# Patient Record
Sex: Female | Born: 1945 | Race: White | Hispanic: No | Marital: Married | Smoking: Former smoker
Health system: Southern US, Community
[De-identification: ages and names within clinical notes are randomized; demographics above are authoritative.]

## PROBLEM LIST (undated history)

## (undated) DIAGNOSIS — N1831 Chronic kidney disease, stage 3a: Secondary | ICD-10-CM

## (undated) DIAGNOSIS — G629 Polyneuropathy, unspecified: Secondary | ICD-10-CM

## (undated) DIAGNOSIS — E7800 Pure hypercholesterolemia, unspecified: Secondary | ICD-10-CM

## (undated) DIAGNOSIS — J302 Other seasonal allergic rhinitis: Secondary | ICD-10-CM

## (undated) DIAGNOSIS — J349 Unspecified disorder of nose and nasal sinuses: Secondary | ICD-10-CM

## (undated) DIAGNOSIS — R011 Cardiac murmur, unspecified: Secondary | ICD-10-CM

## (undated) DIAGNOSIS — E039 Hypothyroidism, unspecified: Secondary | ICD-10-CM

## (undated) DIAGNOSIS — M1990 Unspecified osteoarthritis, unspecified site: Secondary | ICD-10-CM

## (undated) DIAGNOSIS — M719 Bursopathy, unspecified: Secondary | ICD-10-CM

## (undated) DIAGNOSIS — K8010 Calculus of gallbladder with chronic cholecystitis without obstruction: Secondary | ICD-10-CM

## (undated) DIAGNOSIS — K219 Gastro-esophageal reflux disease without esophagitis: Secondary | ICD-10-CM

## (undated) DIAGNOSIS — I10 Essential (primary) hypertension: Secondary | ICD-10-CM

## (undated) DIAGNOSIS — E785 Hyperlipidemia, unspecified: Secondary | ICD-10-CM

## (undated) DIAGNOSIS — I2510 Atherosclerotic heart disease of native coronary artery without angina pectoris: Secondary | ICD-10-CM

## (undated) DIAGNOSIS — R7303 Prediabetes: Secondary | ICD-10-CM

## (undated) DIAGNOSIS — D649 Anemia, unspecified: Secondary | ICD-10-CM

## (undated) DIAGNOSIS — E789 Disorder of lipoprotein metabolism, unspecified: Secondary | ICD-10-CM

## (undated) DIAGNOSIS — M79606 Pain in leg, unspecified: Secondary | ICD-10-CM

## (undated) DIAGNOSIS — I2121 ST elevation (STEMI) myocardial infarction involving left circumflex coronary artery: Secondary | ICD-10-CM

## (undated) DIAGNOSIS — R911 Solitary pulmonary nodule: Secondary | ICD-10-CM

## (undated) HISTORY — PX: COLONOSCOPY: SHX174

## (undated) HISTORY — DX: Atherosclerotic heart disease of native coronary artery without angina pectoris: I25.10

## (undated) HISTORY — DX: Solitary pulmonary nodule: R91.1

## (undated) HISTORY — DX: Pure hypercholesterolemia, unspecified: E78.00

## (undated) HISTORY — DX: Unspecified osteoarthritis, unspecified site: M19.90

## (undated) HISTORY — DX: Hypothyroidism, unspecified: E03.9

## (undated) HISTORY — DX: Hyperlipidemia, unspecified: E78.5

## (undated) HISTORY — DX: Polyneuropathy, unspecified: G62.9

## (undated) HISTORY — DX: Chronic kidney disease, stage 3a: N18.31

## (undated) HISTORY — DX: Bursopathy, unspecified: M71.9

## (undated) HISTORY — DX: Pain in leg, unspecified: M79.606

## (undated) HISTORY — PX: APPENDECTOMY: SHX54

---

## 1998-01-30 DIAGNOSIS — M1990 Unspecified osteoarthritis, unspecified site: Secondary | ICD-10-CM | POA: Insufficient documentation

## 1998-06-08 ENCOUNTER — Ambulatory Visit (HOSPITAL_COMMUNITY): Admission: RE | Admit: 1998-06-08 | Discharge: 1998-06-08 | Payer: Self-pay | Admitting: *Deleted

## 1999-03-23 ENCOUNTER — Encounter: Payer: Self-pay | Admitting: Gynecology

## 1999-03-28 ENCOUNTER — Inpatient Hospital Stay (HOSPITAL_COMMUNITY): Admission: RE | Admit: 1999-03-28 | Discharge: 1999-03-30 | Payer: Self-pay | Admitting: Gynecology

## 1999-07-19 ENCOUNTER — Ambulatory Visit (HOSPITAL_COMMUNITY): Admission: RE | Admit: 1999-07-19 | Discharge: 1999-07-19 | Payer: Self-pay | Admitting: Gastroenterology

## 1999-08-09 ENCOUNTER — Encounter: Admission: RE | Admit: 1999-08-09 | Discharge: 1999-08-09 | Payer: Self-pay | Admitting: Gastroenterology

## 1999-08-09 ENCOUNTER — Encounter: Payer: Self-pay | Admitting: Gastroenterology

## 2000-08-31 ENCOUNTER — Encounter: Payer: Self-pay | Admitting: Family Medicine

## 2000-08-31 ENCOUNTER — Ambulatory Visit (HOSPITAL_COMMUNITY): Admission: RE | Admit: 2000-08-31 | Discharge: 2000-08-31 | Payer: Self-pay | Admitting: Family Medicine

## 2001-02-04 ENCOUNTER — Encounter: Payer: Self-pay | Admitting: Specialist

## 2001-02-11 ENCOUNTER — Ambulatory Visit (HOSPITAL_COMMUNITY): Admission: RE | Admit: 2001-02-11 | Discharge: 2001-02-11 | Payer: Self-pay | Admitting: Specialist

## 2001-11-15 ENCOUNTER — Encounter: Payer: Self-pay | Admitting: Specialist

## 2001-11-22 ENCOUNTER — Encounter: Payer: Self-pay | Admitting: Specialist

## 2001-11-22 ENCOUNTER — Inpatient Hospital Stay (HOSPITAL_COMMUNITY): Admission: RE | Admit: 2001-11-22 | Discharge: 2001-11-26 | Payer: Self-pay | Admitting: Specialist

## 2002-05-21 ENCOUNTER — Other Ambulatory Visit: Admission: RE | Admit: 2002-05-21 | Discharge: 2002-05-21 | Payer: Self-pay | Admitting: Gynecology

## 2003-04-03 ENCOUNTER — Ambulatory Visit (HOSPITAL_COMMUNITY): Admission: RE | Admit: 2003-04-03 | Discharge: 2003-04-03 | Payer: Self-pay | Admitting: Family Medicine

## 2003-05-01 ENCOUNTER — Ambulatory Visit (HOSPITAL_COMMUNITY): Admission: RE | Admit: 2003-05-01 | Discharge: 2003-05-01 | Payer: Self-pay | Admitting: Specialist

## 2003-10-27 ENCOUNTER — Other Ambulatory Visit: Admission: RE | Admit: 2003-10-27 | Discharge: 2003-10-27 | Payer: Self-pay | Admitting: Gynecology

## 2004-10-27 ENCOUNTER — Other Ambulatory Visit: Admission: RE | Admit: 2004-10-27 | Discharge: 2004-10-27 | Payer: Self-pay | Admitting: Gynecology

## 2005-10-26 ENCOUNTER — Other Ambulatory Visit: Admission: RE | Admit: 2005-10-26 | Discharge: 2005-10-26 | Payer: Self-pay | Admitting: Gynecology

## 2005-11-09 ENCOUNTER — Encounter: Admission: RE | Admit: 2005-11-09 | Discharge: 2006-02-07 | Payer: Self-pay | Admitting: Family Medicine

## 2010-04-13 ENCOUNTER — Ambulatory Visit (HOSPITAL_COMMUNITY)
Admission: RE | Admit: 2010-04-13 | Discharge: 2010-04-13 | Disposition: A | Discharge status: Home / Self Care | Payer: 59 | Source: Ambulatory Visit | Attending: Orthopedic Surgery | Admitting: Orthopedic Surgery

## 2010-04-13 ENCOUNTER — Encounter (HOSPITAL_COMMUNITY)
Admission: RE | Admit: 2010-04-13 | Discharge: 2010-04-13 | Disposition: A | Discharge status: Home / Self Care | Payer: 59 | Source: Ambulatory Visit | Attending: Orthopedic Surgery | Admitting: Orthopedic Surgery

## 2010-04-13 ENCOUNTER — Other Ambulatory Visit (HOSPITAL_COMMUNITY): Payer: Self-pay | Admitting: Orthopedic Surgery

## 2010-04-13 DIAGNOSIS — Z01812 Encounter for preprocedural laboratory examination: Secondary | ICD-10-CM | POA: Insufficient documentation

## 2010-04-13 DIAGNOSIS — M47816 Spondylosis without myelopathy or radiculopathy, lumbar region: Secondary | ICD-10-CM

## 2010-04-13 DIAGNOSIS — M47817 Spondylosis without myelopathy or radiculopathy, lumbosacral region: Secondary | ICD-10-CM | POA: Insufficient documentation

## 2010-04-13 DIAGNOSIS — Z01818 Encounter for other preprocedural examination: Secondary | ICD-10-CM | POA: Insufficient documentation

## 2010-04-13 LAB — BASIC METABOLIC PANEL
BUN: 15 mg/dL (ref 6–23)
CO2: 27 mEq/L (ref 19–32)
Calcium: 10 mg/dL (ref 8.4–10.5)
Chloride: 98 mEq/L (ref 96–112)
Creatinine, Ser: 0.82 mg/dL (ref 0.4–1.2)
GFR calc Af Amer: >60 mL/min (ref >60)
GFR calc non Af Amer: >60 mL/min (ref >60)
Glucose, Bld: 105 mg/dL — ABNORMAL HIGH (ref 70–99)
Potassium: 3.8 mEq/L (ref 3.5–5.1)
Sodium: 133 mEq/L — ABNORMAL LOW (ref 135–145)

## 2010-04-13 LAB — CBC
HCT: 39.6 % (ref 36.0–46.0)
Hemoglobin: 13.7 g/dL (ref 12.0–15.0)
MCH: 29.5 pg (ref 26.0–34.0)
MCHC: 34.6 g/dL (ref 30.0–36.0)
MCV: 85.2 fL (ref 78.0–100.0)
Platelets: 187 10*3/uL (ref 150–400)
RBC: 4.65 MIL/uL (ref 3.87–5.11)
RDW: 14 % (ref 11.5–15.5)
WBC: 8.7 10*3/uL (ref 4.0–10.5)

## 2010-04-13 LAB — SURGICAL PCR SCREEN
MRSA, PCR: NEGATIVE
Staphylococcus aureus: NEGATIVE

## 2010-04-20 ENCOUNTER — Ambulatory Visit (HOSPITAL_COMMUNITY)
Admission: RE | Admit: 2010-04-20 | Discharge: 2010-04-20 | Disposition: A | Discharge status: Home / Self Care | Payer: 59 | Source: Ambulatory Visit | Attending: Orthopedic Surgery | Admitting: Orthopedic Surgery

## 2010-04-20 ENCOUNTER — Inpatient Hospital Stay (HOSPITAL_COMMUNITY)
Admission: RE | Admit: 2010-04-20 | Discharge: 2010-04-22 | DRG: 460 | Disposition: A | Discharge status: Home / Self Care | Payer: 59 | Source: Ambulatory Visit | Attending: Orthopedic Surgery | Admitting: Orthopedic Surgery

## 2010-04-20 ENCOUNTER — Inpatient Hospital Stay (HOSPITAL_COMMUNITY): Payer: 59

## 2010-04-20 ENCOUNTER — Other Ambulatory Visit (HOSPITAL_COMMUNITY): Payer: Self-pay | Admitting: Orthopedic Surgery

## 2010-04-20 DIAGNOSIS — M5126 Other intervertebral disc displacement, lumbar region: Secondary | ICD-10-CM

## 2010-04-20 DIAGNOSIS — I10 Essential (primary) hypertension: Secondary | ICD-10-CM | POA: Diagnosis present

## 2010-04-20 DIAGNOSIS — K219 Gastro-esophageal reflux disease without esophagitis: Secondary | ICD-10-CM | POA: Diagnosis present

## 2010-04-20 LAB — TYPE AND SCREEN
ABO/RH(D): B POS
Antibody Screen: NEGATIVE

## 2010-04-20 LAB — ABO/RH: ABO/RH(D): B POS

## 2010-04-21 ENCOUNTER — Inpatient Hospital Stay (HOSPITAL_COMMUNITY): Payer: 59

## 2010-04-25 NOTE — Op Note (Signed)
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  PROCEDURES: 1. Gill decompression, L4-L5 left side with complete facetectomy and     excision of pars. 2. Complete diskectomy, L4-L5 with insertion of intervertebral     biomechanical device (titanium size 9 large banana-shaped curved     cage). 3. Posterior instrumented segmental fusion, L4-L5 with Synthes Matrix     pedicle screw system. 4. Posterolateral arthrodesis, L4-L5 with autograft and allograft DBX     mixture.  SURGEON:  Dabria Wadas D. Shon Baton, MD  COMPLICATIONS:  None.  CONDITION:  Stable.  HISTORY:  This is a very pleasant 65 year old woman who has been having progressive disabling back, buttock, and left leg pain.  Attempts at conservative management consisting of physiotherapy, injection therapy, activity modification failed and so she elected to proceed with surgery. All appropriate risks, benefits, and alternatives of surgery were discussed and consent was obtained.  OPERATIVE NOTE:  The patient was brought to the operating room and placed supine on the operating table.  After successful induction of general anesthesia and trach intubation, TED, SCDs, and a Foley were inserted.  The neuro stim representative then inserted all appropriate devices for evoked motor and sensory monitoring throughout the case as well as for EMG monitoring.  Once this was done, the patient was turned prone onto the Wilson frame.  All bony prominences were  well-padded and the back was prepped and draped in a standard fashion.  Appropriate time- out was done to confirm patient, procedure, and affected extremity and all other pertinent important data.  Once this was done, fluoroscopic view x-ray was brought to the wound and I confirmed the L4 pedicle.  I marked this bilaterally and I marked the L4-L5 lateral margins of the pedicle.  I then made a Wiltse incision on the lateral aspect about 2 fingerbreadths lateral to midline dissected bluntly through the paraspinal musculature until I could palpate the transverse process and facet complex.  I then placed the Jamshidi needle along the border where the transverse process and lateral aspect of the facet met and then confirmed with x-ray that I was at the appropriate level.  I advanced the probe and through the pedicle and into the vertebral body.  I was doing this with EMG nerve monitoring while the Jamshidi was being advanced.  Based on visualization and free running EMGs, there was no evidence of pedicle breach and nerve compression.  In the lateral frame, I confirmed I had the correct trajectory.  A guide pin was placed.  I repeated the same procedure at L5 and on the contralateral side.  Once I had all 4  pedicles cannulated, I then proceeded with placing the screws on the left side.  At this point in time, I placed a tap over the guide pin at the left L4 pedicle and advanced it down.  Again not only using fluoroscopy, but I also used the nerve monitoring to confirm there was no breach.  I then placed a 45-mm screw at L4 and a 40-mm screw at L5.  I had satisfactory position of both screws.  At this point, I then took the MIS tube system retractor, placed it down into the wound and placed all the appropriate retractors so that I could visualize the L4- L5 space.  There was significant cystic overgrowth and fibrocollagenous cartilage formation at the L4 pars.  I resected the L4 pars in  its entirety working from lateral to medial and then I used an osteotome to resect the entire inferior L4 facet.  I then performed a generous laminotomy and removed the interspinous process and the ligamentum flavum to expose the thecal sac.  I then worked into the lateral recess resecting the portions of the L5 superior facet that had overgrown. This allowed me to directly visualize the medial and superior border of the L5 pedicle.  I continued my dissection superiorly until I could palpate and visualize the inferior aspect of the L4 pedicle.  At this point, I could now clearly visualize the L4 nerve root, the lateral aspect of thecal sac, and the L5 nerve root.  At this point, I placed neural patties to protect the neural elements and retracted the thecal sac, so I could clearly visualize the disk.  Using bipolar electrocautery, I obtained hemostasis and then began the TLIF.  I incised the annulus and then used a combination of pituitary rongeurs, curettes, and Kerrison rongeurs to remove all of the disk material.  I then used side scraping curettes to decorticate the endplates.  I then took the bone that I had harvested during the decompression, morselized it, and mixed it with DBX and packed it along the anterior annulus.  I then took a size 9 Titan long large cage, packed it with autograft bone mixed with DBX and malleted it to the appropriate depth.  I then made sure that it crossed the midline and was properly seated.  At this point, I placed the [facility] elevator behind it to ensure that it was not compressing centrally and I visualized the L4 and L5 nerve roots and thecal sac to ensure that they were not impinged.  At this point, I irrigated copiously with normal saline and then rechecked the pedicles with direct visualization to confirm that the L4 and L5 pedicle screws had not breached.  Once this was confirmed, I then applied the heads and placed a 40-mm rod and secured with  locking nuts and torqued it appropriately.  I irrigated the wound copiously with normal saline, placed a thrombin-soaked Gelfoam patty over the exposed thecal sac. Prior to placing this, there was no evidence of CSF leak or bleeding.  I then closed the deep fascia with interrupted 0-Vicryl sutures, superficial with 2-0 Vicryl suture, and 3-0 Monocryl for the skin.  On the right-hand side, I went to place my screws.  I tapped over each guide pin and again using fluoroscopy and nerve monitoring and then placed the screws same size that I did on the contralateral side. Again, I had satisfactory placement of both screws and there was no evidence based on nerve monitoring or based on fluoro of breach to  the pedicles.  At this point, I took the same sized rods, secured into position, and torqued down all of the nuts.  At this point, I irrigated the wound copiously with normal saline.  I took a large bur and decorticated the lateral aspect of the L4-L5 facet, the L4 transverse process, and L5 transverse process.  I then packed the remaining portion of allograft, autograft DBX mixed and packed it into the posterolateral gutter for fusion.  I then closed in a similar fashion.  At this point, the patient was extubated and transferred to PACU without incident.  At the end of the case, all needle and sponge counts were correct.  There were no adverse intraoperative events.     Alvy Beal, MD     DDB/MEDQ  D:  04/20/2010  T:  04/21/2010  Job:  [id number]  Electronically Signed by Venita Lick MD on 04/25/2010 01:59:26 PM

## 2010-12-02 ENCOUNTER — Ambulatory Visit (HOSPITAL_BASED_OUTPATIENT_CLINIC_OR_DEPARTMENT_OTHER)
Admission: RE | Admit: 2010-12-02 | Discharge: 2010-12-02 | Disposition: A | Discharge status: Home / Self Care | Payer: 59 | Source: Ambulatory Visit | Attending: Specialist | Admitting: Specialist

## 2010-12-02 DIAGNOSIS — M23305 Other meniscus derangements, unspecified medial meniscus, unspecified knee: Secondary | ICD-10-CM | POA: Insufficient documentation

## 2010-12-02 DIAGNOSIS — Z01812 Encounter for preprocedural laboratory examination: Secondary | ICD-10-CM | POA: Insufficient documentation

## 2010-12-02 DIAGNOSIS — M2340 Loose body in knee, unspecified knee: Secondary | ICD-10-CM | POA: Insufficient documentation

## 2010-12-02 DIAGNOSIS — Z01810 Encounter for preprocedural cardiovascular examination: Secondary | ICD-10-CM | POA: Insufficient documentation

## 2010-12-02 DIAGNOSIS — M1710 Unilateral primary osteoarthritis, unspecified knee: Secondary | ICD-10-CM | POA: Insufficient documentation

## 2010-12-02 LAB — POCT I-STAT 4, (NA,K, GLUC, HGB,HCT)
Glucose, Bld: 113 mg/dL — ABNORMAL HIGH (ref 70–99)
HCT: 40 % (ref 36.0–46.0)
Hemoglobin: 13.6 g/dL (ref 12.0–15.0)
Potassium: 3.7 mEq/L (ref 3.5–5.1)
Sodium: 136 mEq/L (ref 135–145)

## 2010-12-10 NOTE — Op Note (Signed)
  NAMESTEPHAINE, [name] NO.:  [id number]  MEDICAL RECORD NO.:  [id number]  LOCATION:                                 FACILITY:  PHYSICIAN:  Jene Every, M.D.    DATE OF BIRTH:  02-Mar-1945  DATE OF PROCEDURE:  12/02/2010 DATE OF DISCHARGE:                              OPERATIVE REPORT   PREOPERATIVE DIAGNOSES:  Degenerative joint disease, left knee; degenerative meniscus tear.  POSTOPERATIVE DIAGNOSES:  Degenerative joint disease, left knee; degenerative meniscus tear; extensive grade 4 changes IN medial compartment, loose cartilaginous debris; medial meniscus tear, patellofemoral arthrosis; fraying of the ACL; degenerative changes of lateral compartment.  PROCEDURE PERFORMED: 1. Left knee arthroscopy. 2. Partial medial meniscectomy. 3. Evacuation of loose bodies. 4. Chondroplasty of patellofemoral joint, light chondroplasty of the     femoral condyle medially, extensive synovectomy.  HISTORY:  A 65 year old, knee pain, osteoarthritic changes, refractory to conservative treatment.  We discussed total knee versus arthroscopic debridement.  Risks and benefits including bleeding, infection, damage to neurovascular structures, no change in symptoms, worsening symptoms, DVT, PE, anesthetic complications, etc,  need for total knee.  TECHNIQUE:  Patient in supine position after induction of adequate anesthesia and 2 g Kefzol, left lower extremity was prepped and draped in usual sterile fashion.  A lateral parapatellar portal was fashioned with a 11 blade.  Ingress cannula atraumatically placed.  Irrigant was utilized to insufflate the joint.  Under direct visualization, medial parapatellar portal was fashioned with a #11 blade after localization with an 18-gauge needle sparing the medial meniscus.  Noted was extensive grade 4 changes of the medial compartment, femoral condyle, and tibial plateau, extensive tearing of the medial meniscus, fraying of the ACL,  loose cartilaginous debris.  We evacuated the loose cartilage with light chondroplasty of femoral condyle and tibial plateau.  Partial medial meniscectomy to a stable base.  Contoured with 3.5 Kuda shaver. ACL was frayed.  Lateral compartment was fairly tight.  Minor grade 2 and 3 changes of lateral compartment.  Visualization was suboptimal due to the tightness of the compartment.  However, I felt no significant meniscal pathology.  Suprapatellar pouch, extensive patellofemoral arthrosis, normal tracking, light chondroplasty performed here.  We performed extensive synovectomy of medial compartment, suprapatellar pouch and the lateral joint line.  Copiously lavaged the knee.  All instrumentation was then removed.  This was after all compartments revisited.  No further pathology amenable arthroscopic intervention and therefore, removed all instrumentation.  Portals were closed with 4-0 nylon simple sutures, 0.25% Marcaine with epinephrine was infiltrated in the joint.  Wounds dressed sterilely.  Awoken without difficulty and transported to recovery room in satisfactory condition. Patient tolerated procedure well.  There were no complications.  No assistant.     Jene Every, M.D.     [name]  D:  12/02/2010  T:  12/03/2010  Job:  [id number]  Electronically Signed by Jene Every M.D. on 12/10/2010 06:54:25 AM

## 2010-12-26 ENCOUNTER — Other Ambulatory Visit: Payer: Self-pay | Admitting: Otolaryngology

## 2011-01-10 NOTE — H&P (Signed)
NAMEELVIN, [name]:  [id number]  MEDICAL RECORD NO.:  [id number]  LOCATION:                                 FACILITY:  PHYSICIAN:  Jene Every, M.D.    DATE OF BIRTH:  1945/10/27  DATE OF ADMISSION:  01/19/2011 DATE OF DISCHARGE:                             HISTORY & PHYSICAL   CHIEF COMPLAINT:  Left knee pain.  HISTORY OF PRESENT ILLNESS:  A 65 year old female with worsening left knee pain secondary to end-stage osteoarthritis.  The patient elected to have a left total knee arthroplasty by Dr. Jene Every to decrease pain and increase function.  PAST MEDICAL HISTORY:  Hypertension, hyperlipidemia, GERD, and osteoarthritis.  FAMILY MEDICAL HISTORY:  Coronary artery disease.  SOCIAL HISTORY:  A patient of Dr. Clarene Duke.  Occasional alcohol.  Does not smoke.  DRUG ALLERGIES:  PENICILLIN.  CURRENT MEDICATIONS: 1. Benazepril 20 mg daily. 2. Amlodipine 5 mg daily. 3. Triamterene and hydrochlorothiazide 37.5/25 daily. 4. Klor-Con 10 mEq daily. 5. Omeprazole 20 mg daily. 6. Levothyroxine 75 mcg daily. 7. Pravastatin 40 mg daily. 8. Calcium supplement. 9. Aspirin 81 mg daily. 10.Glucosamine b.i.d. 11.Fish oil 1000 mg at bedtime. 12.Melatonin 3 mg at bedtime. 13.MiraLax 1 tablet daily p.r.n. 14.Aleve p.r.n.  REVIEW OF SYSTEMS:  Shows pain on range of motion, especially of the left knee.  She does have a prior right total knee arthroplasty, also some decreased sensation of bilateral feet due to some early neuropathy versus decreased sensation.  PHYSICAL EXAMINATION:  VITAL SIGNS:  Pulse 68, respirations 16, blood pressure 122/70. GENERAL:  The patient is a healthy-appearing 65 year old female in no acute distress.  Pleasant mood and affect, alert and oriented x3. EXAMINATION OF HEAD AND NECK:  Shows cranial nerves II-XII grossly intact.  She has full range of motion in the cervical spine without any difficulty. CHEST:  Shows active breath  sounds bilaterally.  No wheezes, rhonchi, or rales. HEART:  Shows regular rate and rhythm.  No murmur. ABDOMEN:  Nontender and nondistended. EXTREMITIES:  Moderate tenderness in the left knee.  Some mild crepitus. Neurovascularly she is intact distally.  No edema.  She does have some decreased sensation in bilateral feet.  She does have a normal heel-toe gait.  X-RAYS:  Show end-stage osteoarthritis, left knee.  Status post right total knee arthroplasty.  IMPRESSIONS:  End-stage osteoarthritis, left knee.  PLAN OF ACTION:  Left total knee arthroplasty by Dr. Jene Every.     Gabrielle [name] B. Ferne Coe.   ______________________________ Jene Every, M.D.    TBD/MEDQ  D:  01/10/2011  T:  01/10/2011  Job:  [id number]

## 2011-01-12 ENCOUNTER — Encounter (HOSPITAL_COMMUNITY): Payer: Self-pay | Admitting: Pharmacy Technician

## 2011-01-17 ENCOUNTER — Encounter (HOSPITAL_COMMUNITY): Payer: Self-pay

## 2011-01-17 ENCOUNTER — Encounter (HOSPITAL_COMMUNITY)
Admission: RE | Admit: 2011-01-17 | Discharge: 2011-01-17 | Disposition: A | Discharge status: Home / Self Care | Payer: 59 | Source: Ambulatory Visit | Attending: Specialist | Admitting: Specialist

## 2011-01-17 ENCOUNTER — Ambulatory Visit (HOSPITAL_COMMUNITY)
Admission: RE | Admit: 2011-01-17 | Discharge: 2011-01-17 | Disposition: A | Discharge status: Home / Self Care | Payer: 59 | Source: Ambulatory Visit | Attending: Specialist | Admitting: Specialist

## 2011-01-17 DIAGNOSIS — I10 Essential (primary) hypertension: Secondary | ICD-10-CM | POA: Diagnosis present

## 2011-01-17 DIAGNOSIS — K219 Gastro-esophageal reflux disease without esophagitis: Secondary | ICD-10-CM

## 2011-01-17 DIAGNOSIS — M25469 Effusion, unspecified knee: Secondary | ICD-10-CM | POA: Insufficient documentation

## 2011-01-17 DIAGNOSIS — M25569 Pain in unspecified knee: Secondary | ICD-10-CM | POA: Insufficient documentation

## 2011-01-17 DIAGNOSIS — Z01812 Encounter for preprocedural laboratory examination: Secondary | ICD-10-CM | POA: Insufficient documentation

## 2011-01-17 DIAGNOSIS — M1990 Unspecified osteoarthritis, unspecified site: Secondary | ICD-10-CM

## 2011-01-17 DIAGNOSIS — Z01818 Encounter for other preprocedural examination: Secondary | ICD-10-CM | POA: Insufficient documentation

## 2011-01-17 DIAGNOSIS — M1710 Unilateral primary osteoarthritis, unspecified knee: Secondary | ICD-10-CM | POA: Insufficient documentation

## 2011-01-17 DIAGNOSIS — J349 Unspecified disorder of nose and nasal sinuses: Secondary | ICD-10-CM

## 2011-01-17 DIAGNOSIS — E789 Disorder of lipoprotein metabolism, unspecified: Secondary | ICD-10-CM

## 2011-01-17 HISTORY — DX: Unspecified disorder of nose and nasal sinuses: J34.9

## 2011-01-17 HISTORY — DX: Unspecified osteoarthritis, unspecified site: M19.90

## 2011-01-17 HISTORY — PX: CARPAL TUNNEL RELEASE: SHX101

## 2011-01-17 HISTORY — DX: Disorder of lipoprotein metabolism, unspecified: E78.9

## 2011-01-17 HISTORY — DX: Essential (primary) hypertension: I10

## 2011-01-17 HISTORY — PX: BACK SURGERY: SHX140

## 2011-01-17 HISTORY — PX: ABDOMINAL HYSTERECTOMY: SHX81

## 2011-01-17 HISTORY — PX: JOINT REPLACEMENT: SHX530

## 2011-01-17 HISTORY — DX: Gastro-esophageal reflux disease without esophagitis: K21.9

## 2011-01-17 HISTORY — PX: BURCH PROCEDURE: SHX1273

## 2011-01-17 HISTORY — PX: TONSILLECTOMY: SUR1361

## 2011-01-17 LAB — DIFFERENTIAL
Basophils Absolute: 0.1 10*3/uL (ref 0.0–0.1)
Basophils Relative: 1 % (ref 0–1)
Eosinophils Absolute: 0.2 10*3/uL (ref 0.0–0.7)
Eosinophils Relative: 2 % (ref 0–5)
Lymphocytes Relative: 27 % (ref 12–46)
Lymphs Abs: 2.4 10*3/uL (ref 0.7–4.0)
Monocytes Absolute: 0.6 10*3/uL (ref 0.1–1.0)
Monocytes Relative: 7 % (ref 3–12)
Neutro Abs: 5.6 10*3/uL (ref 1.7–7.7)
Neutrophils Relative %: 63 % (ref 43–77)

## 2011-01-17 LAB — CBC
HCT: 40.6 % (ref 36.0–46.0)
Hemoglobin: 13.9 g/dL (ref 12.0–15.0)
MCH: 29 pg (ref 26.0–34.0)
MCHC: 34.2 g/dL (ref 30.0–36.0)
MCV: 84.8 fL (ref 78.0–100.0)
Platelets: 238 10*3/uL (ref 150–400)
RBC: 4.79 MIL/uL (ref 3.87–5.11)
RDW: 13.5 % (ref 11.5–15.5)
WBC: 8.9 10*3/uL (ref 4.0–10.5)

## 2011-01-17 LAB — URINALYSIS, ROUTINE W REFLEX MICROSCOPIC
Bilirubin Urine: NEGATIVE
Glucose, UA: NEGATIVE mg/dL
Hgb urine dipstick: NEGATIVE
Ketones, ur: NEGATIVE mg/dL
Leukocytes, UA: NEGATIVE
Nitrite: NEGATIVE
Protein, ur: NEGATIVE mg/dL
Specific Gravity, Urine: 1.011 (ref 1.005–1.030)
Urobilinogen, UA: 0.2 mg/dL (ref 0.0–1.0)
pH: 6.5 (ref 5.0–8.0)

## 2011-01-17 LAB — SURGICAL PCR SCREEN
MRSA, PCR: NEGATIVE
Staphylococcus aureus: NEGATIVE

## 2011-01-17 LAB — PROTIME-INR
INR: 0.91 (ref 0.00–1.49)
Prothrombin Time: 12.5 seconds (ref 11.6–15.2)

## 2011-01-17 LAB — COMPREHENSIVE METABOLIC PANEL
ALT: 71 U/L — ABNORMAL HIGH (ref 0–35)
AST: 36 U/L (ref 0–37)
Albumin: 4.1 g/dL (ref 3.5–5.2)
Alkaline Phosphatase: 131 U/L — ABNORMAL HIGH (ref 39–117)
BUN: 13 mg/dL (ref 6–23)
CO2: 31 mEq/L (ref 19–32)
Calcium: 10.7 mg/dL — ABNORMAL HIGH (ref 8.4–10.5)
Chloride: 95 mEq/L — ABNORMAL LOW (ref 96–112)
Creatinine, Ser: 0.67 mg/dL (ref 0.50–1.10)
GFR calc Af Amer: >90 mL/min (ref >90)
GFR calc non Af Amer: >90 mL/min (ref >90)
Glucose, Bld: 104 mg/dL — ABNORMAL HIGH (ref 70–99)
Potassium: 3.9 mEq/L (ref 3.5–5.1)
Sodium: 134 mEq/L — ABNORMAL LOW (ref 135–145)
Total Bilirubin: 0.3 mg/dL (ref 0.3–1.2)
Total Protein: 7.9 g/dL (ref 6.0–8.3)

## 2011-01-17 LAB — ABO/RH: ABO/RH(D): B POS

## 2011-01-17 MED ORDER — CHLORHEXIDINE GLUCONATE 4 % EX LIQD: 60.0000 mL | Freq: Once | CUTANEOUS | Status: DC

## 2011-01-17 NOTE — Pre-Procedure Instructions (Signed)
01-17-11 EKG(03-14-10), Stress (2'12) ,CXR (04-29-10), reports with chart .

## 2011-01-17 NOTE — Patient Instructions (Signed)
20 Gabrielle [name]  01/17/2011   Your procedure is scheduled on: 01-19-11  Report to [facility] at 0730 AM.  Call this number if you have problems the morning of surgery: [phone]   Remember:   Do not eat food:After Midnight.  until Midnight .    Take these medicines the morning of surgery with A SIP OF WATER: Amlodipine, Synthroid, Omeprazole   Do not wear jewelry, make-up or nail polish.  Do not wear lotions, powders, or perfumes. You may wear deodorant.  Do not shave 48 hours prior to surgery.  Do not bring valuables to the hospital.  Contacts, dentures or bridgework may not be worn into surgery.  Leave suitcase in the car. After surgery it may be brought to your room.  For patients admitted to the hospital, checkout time is 11:00 AM the day of discharge.   Patients discharged the day of surgery will not be allowed to drive home.  Name and phone number of your driver: Rozann Holts, spouse [phone]  Special Instructions: CHG Shower Use Special Wash: 1/2 bottle night before surgery and 1/2 bottle morning of surgery.   Please read over the following fact sheets that you were given: Blood Transfusion Information and MRSA Information, incentive spirometry instructions

## 2011-01-19 ENCOUNTER — Encounter (HOSPITAL_COMMUNITY): Payer: Self-pay | Admitting: Anesthesiology

## 2011-01-19 ENCOUNTER — Encounter (HOSPITAL_COMMUNITY)
Admission: RE | Disposition: A | Discharge status: Home Health | Payer: Self-pay | Source: Ambulatory Visit | Attending: Specialist

## 2011-01-19 ENCOUNTER — Inpatient Hospital Stay (HOSPITAL_COMMUNITY): Payer: 59 | Admitting: Anesthesiology

## 2011-01-19 ENCOUNTER — Encounter (HOSPITAL_COMMUNITY): Payer: Self-pay | Admitting: *Deleted

## 2011-01-19 ENCOUNTER — Inpatient Hospital Stay (HOSPITAL_COMMUNITY)
Admission: RE | Admit: 2011-01-19 | Discharge: 2011-01-22 | DRG: 470 | Disposition: A | Discharge status: Home Health | Payer: 59 | Source: Ambulatory Visit | Attending: Specialist | Admitting: Specialist

## 2011-01-19 DIAGNOSIS — M1990 Unspecified osteoarthritis, unspecified site: Secondary | ICD-10-CM

## 2011-01-19 DIAGNOSIS — E7800 Pure hypercholesterolemia, unspecified: Secondary | ICD-10-CM | POA: Diagnosis present

## 2011-01-19 DIAGNOSIS — Z96659 Presence of unspecified artificial knee joint: Secondary | ICD-10-CM

## 2011-01-19 DIAGNOSIS — E039 Hypothyroidism, unspecified: Secondary | ICD-10-CM | POA: Diagnosis present

## 2011-01-19 DIAGNOSIS — I10 Essential (primary) hypertension: Secondary | ICD-10-CM | POA: Diagnosis present

## 2011-01-19 DIAGNOSIS — K219 Gastro-esophageal reflux disease without esophagitis: Secondary | ICD-10-CM | POA: Diagnosis present

## 2011-01-19 DIAGNOSIS — M1710 Unilateral primary osteoarthritis, unspecified knee: Principal | ICD-10-CM | POA: Diagnosis present

## 2011-01-19 DIAGNOSIS — M21869 Other specified acquired deformities of unspecified lower leg: Secondary | ICD-10-CM | POA: Diagnosis present

## 2011-01-19 HISTORY — PX: TOTAL KNEE ARTHROPLASTY: SHX125

## 2011-01-19 LAB — TYPE AND SCREEN
ABO/RH(D): B POS
Antibody Screen: NEGATIVE

## 2011-01-19 SURGERY — ARTHROPLASTY, KNEE, TOTAL
Anesthesia: General | Site: Knee | Laterality: Left | Wound class: Clean

## 2011-01-19 MED ORDER — MENTHOL 3 MG MT LOZG: 1.0000 | LOZENGE | OROMUCOSAL | Status: DC | PRN

## 2011-01-19 MED ORDER — DIPHENHYDRAMINE HCL 50 MG/ML IJ SOLN: 12.5000 mg | Freq: Four times a day (QID) | INTRAMUSCULAR | Status: DC | PRN

## 2011-01-19 MED ORDER — PROMETHAZINE HCL 25 MG/ML IJ SOLN: 6.2500 mg | INTRAMUSCULAR | Status: DC | PRN

## 2011-01-19 MED ORDER — BUPIVACAINE-EPINEPHRINE 0.5% -1:200000 IJ SOLN
INTRAMUSCULAR | Status: DC | PRN
  Administered 2011-01-19: 30 mL

## 2011-01-19 MED ORDER — PSEUDOEPHEDRINE HCL ER 120 MG PO TB12
120.0000 mg | ORAL_TABLET | Freq: Two times a day (BID) | ORAL | Status: DC
  Administered 2011-01-19 – 2011-01-22 (×4): 120 mg via ORAL
  Filled 2011-01-19 (×8): qty 1

## 2011-01-19 MED ORDER — RIVAROXABAN 10 MG PO TABS
10.0000 mg | ORAL_TABLET | Freq: Every day | ORAL | Status: DC
  Administered 2011-01-20 – 2011-01-22 (×3): 10 mg via ORAL
  Filled 2011-01-19 (×3): qty 1

## 2011-01-19 MED ORDER — PHENOL 1.4 % MT LIQD: 1.0000 | OROMUCOSAL | Status: DC | PRN

## 2011-01-19 MED ORDER — SIMVASTATIN 20 MG PO TABS
20.0000 mg | ORAL_TABLET | Freq: Every day | ORAL | Status: DC
  Administered 2011-01-20 – 2011-01-21 (×2): 20 mg via ORAL
  Filled 2011-01-19 (×4): qty 1

## 2011-01-19 MED ORDER — ONDANSETRON HCL 4 MG/2ML IJ SOLN: 4.0000 mg | Freq: Four times a day (QID) | INTRAMUSCULAR | Status: DC | PRN

## 2011-01-19 MED ORDER — LABETALOL HCL 5 MG/ML IV SOLN
INTRAVENOUS | Status: DC | PRN
  Administered 2011-01-19: 5 mg via INTRAVENOUS

## 2011-01-19 MED ORDER — SODIUM CHLORIDE 0.9 % IR SOLN
Status: DC | PRN
  Administered 2011-01-19: 08:00:00

## 2011-01-19 MED ORDER — FENTANYL CITRATE 0.05 MG/ML IJ SOLN
INTRAMUSCULAR | Status: DC | PRN
  Administered 2011-01-19 (×3): 100 ug via INTRAVENOUS
  Administered 2011-01-19: 50 ug via INTRAVENOUS
  Administered 2011-01-19: 100 ug via INTRAVENOUS
  Administered 2011-01-19 (×3): 50 ug via INTRAVENOUS

## 2011-01-19 MED ORDER — DIPHENHYDRAMINE HCL 12.5 MG/5ML PO ELIX: 12.5000 mg | ORAL_SOLUTION | ORAL | Status: DC | PRN

## 2011-01-19 MED ORDER — ONDANSETRON HCL 4 MG/2ML IJ SOLN
INTRAMUSCULAR | Status: DC | PRN
  Administered 2011-01-19: 4 mg via INTRAVENOUS

## 2011-01-19 MED ORDER — METOCLOPRAMIDE HCL 5 MG/ML IJ SOLN
INTRAMUSCULAR | Status: DC | PRN
  Administered 2011-01-19: 10 mg via INTRAVENOUS

## 2011-01-19 MED ORDER — ONDANSETRON HCL 4 MG PO TABS: 4.0000 mg | ORAL_TABLET | Freq: Four times a day (QID) | ORAL | Status: DC | PRN

## 2011-01-19 MED ORDER — HYDROMORPHONE 0.3 MG/ML IV SOLN
INTRAVENOUS | Status: DC
  Administered 2011-01-19: 0.2 mg via INTRAVENOUS
  Administered 2011-01-20: 01:00:00 via INTRAVENOUS
  Administered 2011-01-20: 2.79 mg via INTRAVENOUS
  Administered 2011-01-20: 1.59 mg via INTRAVENOUS
  Administered 2011-01-21: 0.6 mg via INTRAVENOUS
  Filled 2011-01-19 (×2): qty 25

## 2011-01-19 MED ORDER — LACTATED RINGERS IV SOLN: INTRAVENOUS | Status: DC

## 2011-01-19 MED ORDER — DOCUSATE SODIUM 100 MG PO CAPS
100.0000 mg | ORAL_CAPSULE | Freq: Two times a day (BID) | ORAL | Status: DC
  Administered 2011-01-19 – 2011-01-22 (×7): 100 mg via ORAL
  Filled 2011-01-19 (×8): qty 1

## 2011-01-19 MED ORDER — ACETAMINOPHEN 650 MG RE SUPP: 650.0000 mg | Freq: Four times a day (QID) | RECTAL | Status: DC | PRN

## 2011-01-19 MED ORDER — PROPOFOL 10 MG/ML IV BOLUS
INTRAVENOUS | Status: DC | PRN
  Administered 2011-01-19: 200 mg via INTRAVENOUS

## 2011-01-19 MED ORDER — POTASSIUM CHLORIDE CRYS ER 10 MEQ PO TBCR
10.0000 meq | EXTENDED_RELEASE_TABLET | Freq: Every day | ORAL | Status: DC
  Administered 2011-01-19 – 2011-01-21 (×2): 10 meq via ORAL
  Filled 2011-01-19 (×4): qty 1

## 2011-01-19 MED ORDER — HYDROMORPHONE HCL PF 1 MG/ML IJ SOLN
0.5000 mg | INTRAMUSCULAR | Status: DC | PRN
  Administered 2011-01-20 – 2011-01-21 (×4): 1 mg via INTRAVENOUS
  Filled 2011-01-19 (×4): qty 1

## 2011-01-19 MED ORDER — HYDROMORPHONE HCL PF 1 MG/ML IJ SOLN
0.2500 mg | INTRAMUSCULAR | Status: DC | PRN
  Administered 2011-01-19 (×2): 0.5 mg via INTRAVENOUS

## 2011-01-19 MED ORDER — NEOSTIGMINE METHYLSULFATE 1 MG/ML IJ SOLN
INTRAMUSCULAR | Status: DC | PRN
  Administered 2011-01-19: 2 mg via INTRAVENOUS

## 2011-01-19 MED ORDER — METOCLOPRAMIDE HCL 5 MG/ML IJ SOLN: 5.0000 mg | Freq: Three times a day (TID) | INTRAMUSCULAR | Status: DC | PRN

## 2011-01-19 MED ORDER — SODIUM CHLORIDE 0.9 % IJ SOLN: 9.0000 mL | INTRAMUSCULAR | Status: DC | PRN

## 2011-01-19 MED ORDER — MIDAZOLAM HCL 5 MG/5ML IJ SOLN
INTRAMUSCULAR | Status: DC | PRN
  Administered 2011-01-19: 2 mg via INTRAVENOUS

## 2011-01-19 MED ORDER — ROCURONIUM BROMIDE 100 MG/10ML IV SOLN
INTRAVENOUS | Status: DC | PRN
  Administered 2011-01-19: 50 mg via INTRAVENOUS

## 2011-01-19 MED ORDER — ACETAMINOPHEN 325 MG PO TABS: 650.0000 mg | ORAL_TABLET | Freq: Four times a day (QID) | ORAL | Status: DC | PRN

## 2011-01-19 MED ORDER — CEFAZOLIN SODIUM-DEXTROSE 2-3 GM-% IV SOLR: 2.0000 g | INTRAVENOUS | Status: DC

## 2011-01-19 MED ORDER — DOCUSATE SODIUM 100 MG PO CAPS: 100.0000 mg | ORAL_CAPSULE | ORAL | Status: DC

## 2011-01-19 MED ORDER — OXYCODONE HCL 5 MG PO TABS
5.0000 mg | ORAL_TABLET | ORAL | Status: DC | PRN
  Administered 2011-01-20 – 2011-01-22 (×7): 10 mg via ORAL
  Filled 2011-01-19 (×7): qty 2

## 2011-01-19 MED ORDER — LEVOTHYROXINE SODIUM 75 MCG PO TABS
75.0000 ug | ORAL_TABLET | Freq: Every day | ORAL | Status: DC
  Administered 2011-01-20 – 2011-01-22 (×3): 75 ug via ORAL
  Filled 2011-01-19 (×3): qty 1

## 2011-01-19 MED ORDER — BENAZEPRIL HCL 20 MG PO TABS
20.0000 mg | ORAL_TABLET | Freq: Every day | ORAL | Status: DC
  Administered 2011-01-19 – 2011-01-22 (×4): 20 mg via ORAL
  Filled 2011-01-19 (×4): qty 1

## 2011-01-19 MED ORDER — HYDROMORPHONE HCL PF 1 MG/ML IJ SOLN
INTRAMUSCULAR | Status: DC | PRN
  Administered 2011-01-19 (×2): 1 mg via INTRAVENOUS

## 2011-01-19 MED ORDER — KCL IN DEXTROSE-NACL 10-5-0.45 MEQ/L-%-% IV SOLN
INTRAVENOUS | Status: DC
  Administered 2011-01-19 – 2011-01-21 (×5): via INTRAVENOUS
  Filled 2011-01-19 (×10): qty 1000

## 2011-01-19 MED ORDER — DIPHENHYDRAMINE HCL 12.5 MG/5ML PO ELIX: 12.5000 mg | ORAL_SOLUTION | Freq: Four times a day (QID) | ORAL | Status: DC | PRN

## 2011-01-19 MED ORDER — MELATONIN 3 MG PO TABS: 1.0000 | ORAL_TABLET | Freq: Every day | ORAL | Status: DC

## 2011-01-19 MED ORDER — NALOXONE HCL 0.4 MG/ML IJ SOLN: 0.4000 mg | INTRAMUSCULAR | Status: DC | PRN

## 2011-01-19 MED ORDER — CEFAZOLIN SODIUM 1-5 GM-% IV SOLN
1.0000 g | Freq: Four times a day (QID) | INTRAVENOUS | Status: AC
  Administered 2011-01-19 – 2011-01-20 (×3): 1 g via INTRAVENOUS
  Filled 2011-01-19 (×3): qty 50

## 2011-01-19 MED ORDER — LIDOCAINE HCL (CARDIAC) 20 MG/ML IV SOLN
INTRAVENOUS | Status: DC | PRN
  Administered 2011-01-19: 50 mg via INTRAVENOUS

## 2011-01-19 MED ORDER — METOCLOPRAMIDE HCL 10 MG PO TABS: 5.0000 mg | ORAL_TABLET | Freq: Three times a day (TID) | ORAL | Status: DC | PRN

## 2011-01-19 MED ORDER — TRIAMTERENE-HCTZ 37.5-25 MG PO CAPS
1.0000 | ORAL_CAPSULE | Freq: Every day | ORAL | Status: DC
  Administered 2011-01-19 – 2011-01-22 (×4): 1 via ORAL
  Filled 2011-01-19 (×4): qty 1

## 2011-01-19 MED ORDER — ROPIVACAINE HCL 5 MG/ML IJ SOLN
INTRAMUSCULAR | Status: DC | PRN
  Administered 2011-01-19: 30 mL via EPIDURAL

## 2011-01-19 MED ORDER — AMLODIPINE BESYLATE 5 MG PO TABS
5.0000 mg | ORAL_TABLET | Freq: Every day | ORAL | Status: DC
  Administered 2011-01-20 – 2011-01-22 (×3): 5 mg via ORAL
  Filled 2011-01-19 (×3): qty 1

## 2011-01-19 MED ORDER — LORATADINE 10 MG PO TABS
10.0000 mg | ORAL_TABLET | Freq: Every day | ORAL | Status: DC
  Administered 2011-01-19 – 2011-01-22 (×4): 10 mg via ORAL
  Filled 2011-01-19 (×4): qty 1

## 2011-01-19 MED ORDER — LORATADINE-PSEUDOEPHEDRINE ER 10-240 MG PO TB24
1.0000 | ORAL_TABLET | Freq: Every day | ORAL | Status: DC
  Filled 2011-01-19: qty 1

## 2011-01-19 MED ORDER — PANTOPRAZOLE SODIUM 40 MG PO TBEC
40.0000 mg | DELAYED_RELEASE_TABLET | Freq: Every day | ORAL | Status: DC
  Administered 2011-01-20 – 2011-01-22 (×3): 40 mg via ORAL
  Filled 2011-01-19 (×3): qty 1

## 2011-01-19 MED ORDER — METHOCARBAMOL 500 MG PO TABS
500.0000 mg | ORAL_TABLET | Freq: Four times a day (QID) | ORAL | Status: DC | PRN
  Administered 2011-01-20 – 2011-01-22 (×5): 500 mg via ORAL
  Filled 2011-01-19 (×5): qty 1

## 2011-01-19 MED ORDER — GLYCOPYRROLATE 0.2 MG/ML IJ SOLN
INTRAMUSCULAR | Status: DC | PRN
  Administered 2011-01-19: 0.2 mg via INTRAVENOUS

## 2011-01-19 MED ORDER — POLYETHYLENE GLYCOL 3350 17 G PO PACK
17.0000 g | PACK | Freq: Every day | ORAL | Status: DC
  Filled 2011-01-19 (×2): qty 1

## 2011-01-19 MED ORDER — LACTATED RINGERS IV SOLN
INTRAVENOUS | Status: DC | PRN
  Administered 2011-01-19 (×3): via INTRAVENOUS

## 2011-01-19 MED ORDER — METHOCARBAMOL 100 MG/ML IJ SOLN
500.0000 mg | Freq: Four times a day (QID) | INTRAVENOUS | Status: DC | PRN
  Filled 2011-01-19: qty 5

## 2011-01-19 MED ORDER — CEFAZOLIN SODIUM 1-5 GM-% IV SOLN
INTRAVENOUS | Status: DC | PRN
  Administered 2011-01-19: 2 g via INTRAVENOUS

## 2011-01-19 MED ORDER — DEXAMETHASONE SODIUM PHOSPHATE 10 MG/ML IJ SOLN
INTRAMUSCULAR | Status: DC | PRN
  Administered 2011-01-19: 4 mg via INTRAVENOUS

## 2011-01-19 SURGICAL SUPPLY — 59 items
BAG ZIPLOCK 12X15 (MISCELLANEOUS) ×2 IMPLANT
BANDAGE ELASTIC 4 VELCRO ST LF (GAUZE/BANDAGES/DRESSINGS) ×2 IMPLANT
BANDAGE ELASTIC 6 VELCRO ST LF (GAUZE/BANDAGES/DRESSINGS) ×2 IMPLANT
BANDAGE ESMARK 6X9 LF (GAUZE/BANDAGES/DRESSINGS) ×1 IMPLANT
BLADE SAG 18X100X1.27 (BLADE) ×2 IMPLANT
BLADE SAW SGTL 13.0X1.19X90.0M (BLADE) ×2 IMPLANT
BNDG ESMARK 6X9 LF (GAUZE/BANDAGES/DRESSINGS) ×2
CEMENT HV SMART SET (Cement) ×4 IMPLANT
CHLORAPREP W/TINT 26ML (MISCELLANEOUS) IMPLANT
CLOTH BEACON ORANGE TIMEOUT ST (SAFETY) ×2 IMPLANT
CUFF TOURN SGL QUICK 34 (TOURNIQUET CUFF) ×1
CUFF TRNQT CYL 34X4X40X1 (TOURNIQUET CUFF) ×1 IMPLANT
DECANTER SPIKE VIAL GLASS SM (MISCELLANEOUS) ×2 IMPLANT
DRAPE LG THREE QUARTER DISP (DRAPES) ×4 IMPLANT
DRAPE ORTHO SPLIT 77X108 STRL (DRAPES) ×2
DRAPE POUCH INSTRU U-SHP 10X18 (DRAPES) ×2 IMPLANT
DRAPE SURG ORHT 6 SPLT 77X108 (DRAPES) ×2 IMPLANT
DRAPE U-SHAPE 47X51 STRL (DRAPES) ×2 IMPLANT
DRSG ADAPTIC 3X8 NADH LF (GAUZE/BANDAGES/DRESSINGS) ×2 IMPLANT
DRSG EMULSION OIL 3X16 NADH (GAUZE/BANDAGES/DRESSINGS) ×2 IMPLANT
DRSG PAD ABDOMINAL 8X10 ST (GAUZE/BANDAGES/DRESSINGS) ×2 IMPLANT
DURAPREP 26ML APPLICATOR (WOUND CARE) ×2 IMPLANT
ELECT REM PT RETURN 9FT ADLT (ELECTROSURGICAL) ×2
ELECTRODE REM PT RTRN 9FT ADLT (ELECTROSURGICAL) ×1 IMPLANT
EVACUATOR 1/8 PVC DRAIN (DRAIN) ×2 IMPLANT
FACESHIELD LNG OPTICON STERILE (SAFETY) ×10 IMPLANT
GLOVE BIO SURGEON STRL SZ 6.5 (GLOVE) ×2 IMPLANT
GLOVE BIOGEL PI IND STRL 6.5 (GLOVE) ×1 IMPLANT
GLOVE BIOGEL PI IND STRL 8 (GLOVE) ×1 IMPLANT
GLOVE BIOGEL PI INDICATOR 6.5 (GLOVE) ×1
GLOVE BIOGEL PI INDICATOR 8 (GLOVE) ×1
GLOVE ECLIPSE 6.5 STRL STRAW (GLOVE) ×4 IMPLANT
GLOVE SURG SS PI 8.0 STRL IVOR (GLOVE) ×4 IMPLANT
GOWN PREVENTION PLUS XLARGE (GOWN DISPOSABLE) ×2 IMPLANT
HANDPIECE INTERPULSE COAX TIP (DISPOSABLE) ×1
IMMOBILIZER KNEE 20 (SOFTGOODS) ×2
IMMOBILIZER KNEE 20 THIGH 36 (SOFTGOODS) ×1 IMPLANT
KIT BASIN OR (CUSTOM PROCEDURE TRAY) ×2 IMPLANT
MANIFOLD NEPTUNE II (INSTRUMENTS) ×2 IMPLANT
NS IRRIG 1000ML POUR BTL (IV SOLUTION) ×2 IMPLANT
PACK TOTAL JOINT (CUSTOM PROCEDURE TRAY) ×2 IMPLANT
PADDING CAST COTTON 6X4 STRL (CAST SUPPLIES) ×2 IMPLANT
PADDING WEBRIL 6 STERILE (GAUZE/BANDAGES/DRESSINGS) ×2 IMPLANT
POSITIONER SURGICAL ARM (MISCELLANEOUS) ×2 IMPLANT
SET HNDPC FAN SPRY TIP SCT (DISPOSABLE) ×1 IMPLANT
SPONGE GAUZE 4X4 STERILE 39 (GAUZE/BANDAGES/DRESSINGS) ×2 IMPLANT
SPONGE SURGIFOAM ABS GEL 100 (HEMOSTASIS) ×2 IMPLANT
STAPLER VISISTAT (STAPLE) ×2 IMPLANT
SUCTION FRAZIER 12FR DISP (SUCTIONS) ×2 IMPLANT
SUT BONE WAX W31G (SUTURE) ×2 IMPLANT
SUT VIC AB 1 CT1 27 (SUTURE) ×4
SUT VIC AB 1 CT1 27XBRD ANTBC (SUTURE) ×4 IMPLANT
SUT VIC AB 2-0 CT1 27 (SUTURE) ×3
SUT VIC AB 2-0 CT1 TAPERPNT 27 (SUTURE) ×3 IMPLANT
SYR 30ML LL (SYRINGE) ×2 IMPLANT
TOWER CARTRIDGE SMART MIX (DISPOSABLE) ×2 IMPLANT
TRAY FOLEY CATH 14FRSI W/METER (CATHETERS) ×2 IMPLANT
WATER STERILE IRR 1500ML POUR (IV SOLUTION) ×2 IMPLANT
WRAP KNEE MAXI GEL POST OP (GAUZE/BANDAGES/DRESSINGS) ×2 IMPLANT

## 2011-01-19 NOTE — Progress Notes (Signed)

## 2011-01-19 NOTE — Anesthesia Postprocedure Evaluation (Signed)
  Anesthesia Post-op Note  Patient: Gabrielle [name]  Procedure(s) Performed:  TOTAL KNEE ARTHROPLASTY  Patient Location: PACU  Anesthesia Type: GA combined with regional for post-op pain  Level of Consciousness: oriented and sedated  Airway and Oxygen Therapy: Patient Spontanous Breathing and Patient connected to nasal cannula oxygen  Post-op Pain: mild  Post-op Assessment: Post-op Vital signs reviewed, Patient's Cardiovascular Status Stable, Respiratory Function Stable and Patent Airway  Post-op Vital Signs: stable  Complications: No apparent anesthesia complications

## 2011-01-19 NOTE — Anesthesia Preprocedure Evaluation (Signed)
Anesthesia Evaluation  Patient identified by MRN, date of birth, ID band Patient awake    Reviewed: Allergy & Precautions, H&P , NPO status , Patient's Chart, lab work & pertinent test results, reviewed documented beta blocker date and time   Airway Mallampati: II TM Distance: >3 FB Neck ROM: Full    Dental  (+) Teeth Intact   Pulmonary neg pulmonary ROS,  clear to auscultation        Cardiovascular hypertension, Pt. on medications Regular Normal Denies cardiac symptoms   Neuro/Psych Negative Neurological ROS  Negative Psych ROS   GI/Hepatic negative GI ROS, Neg liver ROS,   Endo/Other  Hypothyroidism On replacement  Renal/GU negative Renal ROS  Genitourinary negative   Musculoskeletal negative musculoskeletal ROS (+)   Abdominal   Peds negative pediatric ROS (+)  Hematology negative hematology ROS (+)   Anesthesia Other Findings   Reproductive/Obstetrics negative OB ROS                           Anesthesia Physical Anesthesia Plan  ASA: II  Anesthesia Plan: General   Post-op Pain Management:    Induction: Intravenous  Airway Management Planned: Oral ETT  Additional Equipment:   Intra-op Plan:   Post-operative Plan: Extubation in OR  Informed Consent: I have reviewed the patients History and Physical, chart, labs and discussed the procedure including the risks, benefits and alternatives for the proposed anesthesia with the patient or authorized representative who has indicated his/her understanding and acceptance.     Plan Discussed with: CRNA and Surgeon  Anesthesia Plan Comments:         Anesthesia Quick Evaluation

## 2011-01-19 NOTE — Anesthesia Procedure Notes (Signed)
Anesthesia Regional Block:  Femoral nerve block  Pre-Anesthetic Checklist: ,, timeout performed, Correct Patient, Correct Site, Correct Laterality, Correct Procedure, Correct Position, site marked, Risks and benefits discussed, Surgical consent,  Pre-op evaluation,  At surgeon's request  Laterality: Left  Prep: chloraprep       Needles:  Injection technique: Single-shot  Needle Type: Echogenic Stimulator Needle     Needle Length: 10cm 10 cm Needle Gauge: 20 and 20 G  Needle insertion depth: 3 cm   Additional Needles:  Procedures: ultrasound guided Femoral nerve block Narrative:  Start time: 01/19/2011 9:42 AM End time: 01/19/2011 9:52 AM  Performed by: Personally  Anesthesiologist: Shireen Quan

## 2011-01-19 NOTE — Interval H&P Note (Signed)
History and Physical Interval Note:  01/19/2011 7:37 AM  Gabrielle [name]  has presented today for surgery, with the diagnosis of Degenerative Joint Disease Left Knee  The various methods of treatment have been discussed with the patient and family. After consideration of risks, benefits and other options for treatment, the patient has consented to  Procedure(s): TOTAL KNEE ARTHROPLASTY as a surgical intervention .  The patients' history has been reviewed, patient examined, no change in status, stable for surgery.  I have reviewed the patients' chart and labs.  Questions were answered to the patient's satisfaction.     Kaesyn Johnston C

## 2011-01-19 NOTE — Interval H&P Note (Signed)
History and Physical Interval Note:  01/19/2011 7:37 AM  Gabrielle [name]  has presented today for surgery, with the diagnosis of Degenerative Joint Disease Left Knee  The various methods of treatment have been discussed with the patient and family. After consideration of risks, benefits and other options for treatment, the patient has consented to  Procedure(s): TOTAL KNEE ARTHROPLASTY as a surgical intervention .  The patients' history has been reviewed, patient examined, no change in status, stable for surgery.  I have reviewed the patients' chart and labs.  Questions were answered to the patient's satisfaction.     [name]   

## 2011-01-19 NOTE — Brief Op Note (Signed)
01/19/2011  9:42 AM  PATIENT:  Newton Pigg  65 y.o. female  PRE-OPERATIVE DIAGNOSIS:  Degenerative Joint Disease Left Knee  POST-OPERATIVE DIAGNOSIS:  Degenerative joint disease left knee  PROCEDURE:  Procedure(s): TOTAL KNEE ARTHROPLASTY  SURGEON:  Surgeon(s): Javier Docker  PHYSICIAN ASSISTANT:   ASSISTANTS: strader   ANESTHESIA:   general  EBL:  Total I/O In: 1000 [I.V.:1000] Out: 150 [Urine:150]  BLOOD ADMINISTERED:none  DRAINS: none   LOCAL MEDICATIONS USED:  MARCAINE 25CC  SPECIMEN:  No Specimen    COUNTS:  YES  TOURNIQUET:   Total Tourniquet Time Documented: Thigh (Left) - 99 minutes  DICTATION: .Other Dictation: Dictation Number  [id number]  PLAN OF CARE: Admit to inpatient   PATIENT DISPOSITION:  PACU - hemodynamically stable.   Delay start of Pharmacological VTE agent (>24hrs) due to surgical blood loss or risk of bleeding:  {YES/NO/NOT APPLICABLE:20182

## 2011-01-19 NOTE — Transfer of Care (Signed)
Immediate Anesthesia Transfer of Care Note  Patient: Gabrielle [name]  Procedure(s) Performed:  TOTAL KNEE ARTHROPLASTY  Patient Location: PACU  Anesthesia Type: General  Level of Consciousness: awake, oriented and sedated  Airway & Oxygen Therapy: Patient Spontanous Breathing and Patient connected to face mask oxygen  Post-op Assessment: Report given to PACU RN and Post -op Vital signs reviewed and stable  Post vital signs: Reviewed and stable  Complications: No apparent anesthesia complications

## 2011-01-19 NOTE — H&P (Signed)
Gabrielle [name] is an 65 y.o. female.   Chief Complaint:left knee pain HPI: 65yo end stage DJD.  Past Medical History  Diagnosis Date  . Arthritis 01-17-11    degenerative disc disease-all joints  . Hypertension 01-17-11    tx. meds  . Cholesterol serum elevated 01-17-11    tx. meds  . GERD (gastroesophageal reflux disease) 01-17-11    tx. omeprazole  . Sinus problem 01-17-11    tx. Claritin D    Past Surgical History  Procedure Date  . Abdominal hysterectomy 01-17-11    '93-Hysterectomy-heavy bleeding  . Carpal tunnel release 01-17-11    '02-left  . Joint replacement 01-17-11     RTKA' 02  . Back surgery 01-17-11    04-20-10-T-Lift lumbar fusion  . Tonsillectomy 01-17-11    child  . Burch procedure 01-17-11    Bladder sling    History reviewed. No pertinent family history. Social History:  reports that she has never smoked. She does not have any smokeless tobacco history on file. She reports that she drinks about 1.2 ounces of alcohol per week. She reports that she does not use illicit drugs.  Allergies:  Allergies  Allergen Reactions  . Penicillins Other (See Comments)    Had when was a child     Medications Prior to Admission  Medication Dose Route Frequency Provider Last Rate Last Dose  . ceFAZolin (ANCEF) IVPB 2 g/50 mL premix  2 g Intravenous 60 min Pre-Op Liam Graham, PA      . lactated ringers infusion   Intravenous Continuous Liam Graham, PA       No current outpatient prescriptions on file as of 01/19/2011.    Results for orders placed during the hospital encounter of 01/17/11 (from the past 48 hour(s))  URINALYSIS, ROUTINE W REFLEX MICROSCOPIC     Status: Normal   Collection Time   01/17/11 11:59 AM      Component Value Range Comment   Color, Urine YELLOW  YELLOW     APPearance CLEAR  CLEAR     Specific Gravity, Urine 1.011  1.005 - 1.030     pH 6.5  5.0 - 8.0     Glucose, UA NEGATIVE  NEGATIVE (mg/dL)    Hgb urine dipstick  NEGATIVE  NEGATIVE     Bilirubin Urine NEGATIVE  NEGATIVE     Ketones, ur NEGATIVE  NEGATIVE (mg/dL)    Protein, ur NEGATIVE  NEGATIVE (mg/dL)    Urobilinogen, UA 0.2  0.0 - 1.0 (mg/dL)    Nitrite NEGATIVE  NEGATIVE     Leukocytes, UA NEGATIVE  NEGATIVE  MICROSCOPIC NOT DONE ON URINES WITH NEGATIVE PROTEIN, BLOOD, LEUKOCYTES, NITRITE, OR GLUCOSE <1000 mg/dL.  SURGICAL PCR SCREEN     Status: Normal   Collection Time   01/17/11 12:05 PM      Component Value Range Comment   MRSA, PCR NEGATIVE  NEGATIVE     Staphylococcus aureus NEGATIVE  NEGATIVE    CBC     Status: Normal   Collection Time   01/17/11 12:15 PM      Component Value Range Comment   WBC 8.9  4.0 - 10.5 (K/uL)    RBC 4.79  3.87 - 5.11 (MIL/uL)    Hemoglobin 13.9  12.0 - 15.0 (g/dL)    HCT 16.1  09.6 - 04.5 (%)    MCV 84.8  78.0 - 100.0 (fL)    MCH 29.0  26.0 - 34.0 (pg)    MCHC  34.2  30.0 - 36.0 (g/dL)    RDW 45.4  09.8 - 11.9 (%)    Platelets 238  150 - 400 (K/uL)   COMPREHENSIVE METABOLIC PANEL     Status: Abnormal   Collection Time   01/17/11 12:15 PM      Component Value Range Comment   Sodium 134 (*) 135 - 145 (mEq/L)    Potassium 3.9  3.5 - 5.1 (mEq/L)    Chloride 95 (*) 96 - 112 (mEq/L)    CO2 31  19 - 32 (mEq/L)    Glucose, Bld 104 (*) 70 - 99 (mg/dL)    BUN 13  6 - 23 (mg/dL)    Creatinine, Ser 1.47  0.50 - 1.10 (mg/dL)    Calcium 82.9 (*) 8.4 - 10.5 (mg/dL)    Total Protein 7.9  6.0 - 8.3 (g/dL)    Albumin 4.1  3.5 - 5.2 (g/dL)    AST 36  0 - 37 (U/L)    ALT 71 (*) 0 - 35 (U/L)    Alkaline Phosphatase 131 (*) 39 - 117 (U/L)    Total Bilirubin 0.3  0.3 - 1.2 (mg/dL)    GFR calc non Af Amer >90  >90 (mL/min)    GFR calc Af Amer >90  >90 (mL/min)   DIFFERENTIAL     Status: Normal   Collection Time   01/17/11 12:15 PM      Component Value Range Comment   Neutrophils Relative 63  43 - 77 (%)    Neutro Abs 5.6  1.7 - 7.7 (K/uL)    Lymphocytes Relative 27  12 - 46 (%)    Lymphs Abs 2.4  0.7 - 4.0  (K/uL)    Monocytes Relative 7  3 - 12 (%)    Monocytes Absolute 0.6  0.1 - 1.0 (K/uL)    Eosinophils Relative 2  0 - 5 (%)    Eosinophils Absolute 0.2  0.0 - 0.7 (K/uL)    Basophils Relative 1  0 - 1 (%)    Basophils Absolute 0.1  0.0 - 0.1 (K/uL)   PROTIME-INR     Status: Normal   Collection Time   01/17/11 12:15 PM      Component Value Range Comment   Prothrombin Time 12.5  11.6 - 15.2 (seconds)    INR 0.91  0.00 - 1.49    TYPE AND SCREEN     Status: Normal   Collection Time   01/17/11 12:15 PM      Component Value Range Comment   ABO/RH(D) B POS      Antibody Screen NEG      Sample Expiration 01/22/2011     ABO/RH     Status: Normal   Collection Time   01/17/11 12:15 PM      Component Value Range Comment   ABO/RH(D) B POS      Dg Knee 2 Views Left  01/17/2011  *RADIOLOGY REPORT*  Clinical Data: Knee pain.  Osteoarthritis.  LEFT KNEE - 1-2 VIEW  Comparison: None.  Findings: Mild tricompartmental degenerative spurring is seen. There is mild to moderate medial joint space narrowing.  Probable small knee joint effusion noted.  No evidence of fracture or other significant bone abnormality.  IMPRESSION: Mild to moderate tricompartmental osteoarthritis and probable small knee joint effusion.  Original Report Authenticated By: Danae Orleans, M.D.    Review of Systems  Unable to perform ROS Constitutional: Negative.   HENT: Negative.   Eyes: Negative.  Respiratory: Negative.   Cardiovascular: Negative.   Gastrointestinal: Negative.   Genitourinary: Negative.   Musculoskeletal: Positive for myalgias and joint pain.  Skin: Negative.   Neurological: Negative.   Endo/Heme/Allergies: Negative.   Psychiatric/Behavioral: Negative.     Blood pressure 142/88, pulse 101, temperature 97.6 F (36.4 C), temperature source Oral, resp. rate 18, SpO2 97.00%. Physical Exam  Constitutional: She is oriented to person, place, and time. She appears well-developed and well-nourished.  HENT:    Head: Normocephalic.  Eyes: Pupils are equal, round, and reactive to light.  Neck: Normal range of motion.  Cardiovascular: Normal rate.   Respiratory: Effort normal.  GI: Soft.  Musculoskeletal: She exhibits edema and tenderness.       Left knee effusion. ROM-5-30  Neurological: She is alert and oriented to person, place, and time. She has normal reflexes.  Skin: Skin is warm and dry.  Psychiatric: She has a normal mood and affect.     Assessment/Plan DJD left knee. PLAN LTKR. Risks and benefits discussed.  Eliabeth Shoff C 01/19/2011, 7:33 AM

## 2011-01-20 LAB — CBC
HCT: 31.6 % — ABNORMAL LOW (ref 36.0–46.0)
Hemoglobin: 10.6 g/dL — ABNORMAL LOW (ref 12.0–15.0)
MCH: 28.8 pg (ref 26.0–34.0)
MCHC: 33.5 g/dL (ref 30.0–36.0)
MCV: 85.9 fL (ref 78.0–100.0)
Platelets: 202 10*3/uL (ref 150–400)
RBC: 3.68 MIL/uL — ABNORMAL LOW (ref 3.87–5.11)
RDW: 13.4 % (ref 11.5–15.5)
WBC: 11.5 10*3/uL — ABNORMAL HIGH (ref 4.0–10.5)

## 2011-01-20 LAB — BASIC METABOLIC PANEL
BUN: 7 mg/dL (ref 6–23)
CO2: 28 mEq/L (ref 19–32)
Calcium: 9.1 mg/dL (ref 8.4–10.5)
Chloride: 98 mEq/L (ref 96–112)
Creatinine, Ser: 0.6 mg/dL (ref 0.50–1.10)
GFR calc Af Amer: >90 mL/min (ref >90)
GFR calc non Af Amer: >90 mL/min (ref >90)
Glucose, Bld: 135 mg/dL — ABNORMAL HIGH (ref 70–99)
Potassium: 3.1 mEq/L — ABNORMAL LOW (ref 3.5–5.1)
Sodium: 135 mEq/L (ref 135–145)

## 2011-01-20 MED ORDER — METHOCARBAMOL 500 MG PO TABS: 500.0000 mg | ORAL_TABLET | Freq: Four times a day (QID) | ORAL | Status: AC | PRN

## 2011-01-20 MED ORDER — OXYCODONE HCL 5 MG PO TABS: 5.0000 mg | ORAL_TABLET | ORAL | Status: AC | PRN

## 2011-01-20 MED ORDER — RIVAROXABAN 10 MG PO TABS: 10.0000 mg | ORAL_TABLET | Freq: Every day | ORAL | Status: DC

## 2011-01-20 MED ORDER — POTASSIUM CHLORIDE CRYS ER 20 MEQ PO TBCR
40.0000 meq | EXTENDED_RELEASE_TABLET | Freq: Two times a day (BID) | ORAL | Status: AC
  Administered 2011-01-20 – 2011-01-21 (×4): 40 meq via ORAL
  Filled 2011-01-20 (×4): qty 2

## 2011-01-20 MED ORDER — POLYETHYLENE GLYCOL 3350 17 G PO PACK
17.0000 g | PACK | Freq: Two times a day (BID) | ORAL | Status: DC
  Administered 2011-01-20 – 2011-01-22 (×4): 17 g via ORAL
  Filled 2011-01-20 (×6): qty 1

## 2011-01-20 NOTE — Progress Notes (Signed)
Chart reviewed and UR completed. 

## 2011-01-20 NOTE — Progress Notes (Signed)
Physical Therapy Treatment Patient Details Name: Gabrielle [name] MRN: [id number] DOB: [birth year] Today's Date: 01/20/2011 13:15-13:28 TA  PT Assessment/Plan  PT - Assessment/Plan Comments on Treatment Session: Pt fatigued from sitting up in chair. Doing well for POD #1. Expect good progress. Home with HHPT. PT Plan: Discharge plan remains appropriate PT Frequency: 7X/week Follow Up Recommendations: Home health PT Equipment Recommended: None recommended by PT PT Goals  Acute Rehab PT Goals PT Goal Formulation: With patient/family Time For Goal Achievement: 4 days Pt will go Supine/Side to Sit: with modified independence PT Goal: Supine/Side to Sit - Progress: Not met Pt will go Sit to Stand: with modified independence PT Goal: Sit to Stand - Progress: Met Pt will go Stand to Sit: with modified independence PT Goal: Stand to Sit - Progress: Met Pt will Ambulate: >150 feet;with modified independence;with rolling walker PT Goal: Ambulate - Progress: Progressing toward goal Pt will Perform Home Exercise Program: with min assist PT Goal: Perform Home Exercise Program - Progress: Progressing toward goal  PT Treatment Precautions/Restrictions  Precautions Precautions: Knee Required Braces or Orthoses: Yes Restrictions Weight Bearing Restrictions: Yes LLE Weight Bearing: Weight bearing as tolerated Mobility (including Balance) Bed Mobility Bed Mobility: Yes Supine to Sit: 4: Min assist Supine to Sit Details (indicate cue type and reason): min A for support of LLE, VCs hand placement Sit to Supine - Right: 4: Min assist;With rail;HOB flat Sit to Supine - Right Details (indicate cue type and reason): min A for LLE Transfers Transfers: Yes Sit to Stand: 5: Supervision;From chair/3-in-1;With armrests;With upper extremity assist Sit to Stand Details (indicate cue type and reason): VCs for set up Stand to Sit: 5: Supervision;To bed;With upper extremity assist Stand to Sit Details:  min A to control descent Ambulation/Gait Ambulation/Gait: Yes Ambulation/Gait Assistance: 5: Supervision Ambulation/Gait Assistance Details (indicate cue type and reason): VCs flexed neck Ambulation Distance (Feet): 4 Feet (chair to bed) Assistive device: Rolling walker Gait Pattern: Step-to pattern Stairs: No    Exercise  Total Joint Exercises Ankle Circles/Pumps: 10 reps;AROM;Both;Supine Quad Sets: AROM;Left;10 reps;Supine Heel Slides: AAROM;Left;Supine;10 reps End of Session PT - End of Session Equipment Utilized During Treatment: Gait belt Activity Tolerance: Patient limited by pain;Patient limited by fatigue Patient left: in bed;with call bell in reach;with family/visitor present Nurse Communication: Mobility status for transfers;Mobility status for ambulation General Behavior During Session: [facility] for tasks performed Cognition: [facility] for tasks performed  Tamala Ser 01/20/2011, 1:40 PM Tamala Ser PT 01/20/2011  [phone]

## 2011-01-20 NOTE — Progress Notes (Signed)
Physical Therapy Evaluation Patient Details Name: Gabrielle [name] MRN: [id number] DOB: May 18, 1945 Today's Date: 01/20/2011 10:10-11:00 PT EVAL 2  Problem List: There is no problem list on file for this patient.   Past Medical History:  Past Medical History  Diagnosis Date  . Arthritis 01-17-11    degenerative disc disease-all joints  . Hypertension 01-17-11    tx. meds  . Cholesterol serum elevated 01-17-11    tx. meds  . GERD (gastroesophageal reflux disease) 01-17-11    tx. omeprazole  . Sinus problem 01-17-11    tx. Claritin D   Past Surgical History:  Past Surgical History  Procedure Date  . Abdominal hysterectomy 01-17-11    '93-Hysterectomy-heavy bleeding  . Carpal tunnel release 01-17-11    '02-left  . Joint replacement 01-17-11     RTKA' 02  . Back surgery 01-17-11    04-20-10-T-Lift lumbar fusion  . Tonsillectomy 01-17-11    child  . Burch procedure 01-17-11    Bladder sling    PT Assessment/Plan/Recommendation PT Assessment Clinical Impression Statement: Pt did well for POD 1. Expect good progress.  PT Recommendation/Assessment: Patient will need skilled PT in the acute care venue PT Therapy Diagnosis : Difficulty walking;Acute pain PT Plan PT Frequency: 7X/week PT Treatment/Interventions: DME instruction;Gait training;Stair training;Therapeutic activities;Therapeutic exercise;Patient/family education PT Recommendation Follow Up Recommendations: Home health PT Equipment Recommended: None recommended by PT PT Goals  Acute Rehab PT Goals PT Goal Formulation: With patient/family Time For Goal Achievement: 4 days Pt will go Supine/Side to Sit: with modified independence PT Goal: Supine/Side to Sit - Progress: Not met Pt will go Sit to Stand: with modified independence PT Goal: Sit to Stand - Progress: Not met Pt will go Stand to Sit: with modified independence PT Goal: Stand to Sit - Progress: Not met Pt will Ambulate: >150 feet;with modified  independence;with rolling walker PT Goal: Ambulate - Progress: Not met Pt will Perform Home Exercise Program: with min assist  PT Evaluation Precautions/Restrictions  Precautions Precautions: Knee Required Braces or Orthoses: Yes Restrictions Weight Bearing Restrictions: Yes LLE Weight Bearing: Weight bearing as tolerated Prior Functioning  Home Living Lives With: Spouse Receives Help From: Family Type of Home: House Home Layout: Two level Home Access: Stairs to enter Entrance Stairs-Rails: None Entrance Stairs-Number of Steps: 2 Home Adaptive Equipment: Walker - rolling;Shower chair with back;Bedside commode/3-in-1 Prior Function Level of Independence: Independent with basic ADLs;Independent with homemaking with ambulation;Independent with gait;Independent with transfers Able to Take Stairs?: Yes Vocation: Full time employment Vocation Requirements: Scientist, physiological, works from home Cognition Cognition Arousal/Alertness: Awake/alert Overall Cognitive Status: Appears within functional limits for tasks assessed Orientation Level: Oriented X4 Sensation/Coordination Sensation Light Touch: Impaired Detail (decreased to light touch L foot, this is baseline) Extremity Assessment RUE Assessment RUE Assessment: Within Functional Limits LUE Assessment LUE Assessment: Within Functional Limits RLE Assessment RLE Assessment: Within Functional Limits LLE Assessment LLE Assessment: Exceptions to WFL LLE PROM (degrees) Left Knee Flexion 0-140: 30 Mobility (including Balance) Bed Mobility Bed Mobility: Yes Supine to Sit: 4: Min assist Supine to Sit Details (indicate cue type and reason): min A for support of LLE, VCs hand placement Transfers Transfers: Yes Sit to Stand: 4: Min assist;From bed;With upper extremity assist Sit to Stand Details (indicate cue type and reason): pt 75%, VCs hand placement Stand to Sit: 4: Min assist;To chair/3-in-1;With armrests;With upper extremity  assist Stand to Sit Details: min A to control descent Ambulation/Gait Ambulation/Gait: Yes Ambulation/Gait Assistance: 4: Min assist Ambulation/Gait Assistance Details (  indicate cue type and reason): VCs sequencing, min/guard assist for balance Ambulation Distance (Feet): 25 Feet Assistive device: Rolling walker Gait Pattern: Step-to pattern    Exercise  Total Joint Exercises Ankle Circles/Pumps: 10 reps;AROM;Both;Supine Quad Sets: AROM;Left;10 reps;Supine Heel Slides: AAROM;Left;Supine;10 reps End of Session PT - End of Session Equipment Utilized During Treatment: Gait belt Activity Tolerance: Patient limited by pain Patient left: in chair;with call bell in reach;with family/visitor present Nurse Communication: Mobility status for transfers;Mobility status for ambulation General Behavior During Session: [facility] for tasks performed Cognition: [facility] for tasks performed  Tamala Ser 01/20/2011, 12:32 PM  Tamala Ser PT 01/20/2011  [phone]

## 2011-01-20 NOTE — Progress Notes (Signed)
OT Screen Order received, chart reviewed. Spoke briefly with patient and patient's husband. Pt had knee sx 10 yrs ago and has all necessary DME, AE & handicapped accessible bathroom. Pt will also have prn A at home from husband. Pt presents with no OT needs at this time. Will sign off.  Garrel Ridgel, OTR/L  Pager [phone] 01/20/2011

## 2011-01-20 NOTE — Progress Notes (Signed)
01/20/2011 Gabrielle [name] ,bsn ccm [phone] CM spoke with patient and spouse. Plans are for patient to go to her home in [place] where spouse will be caregiver. She is requesting [facility]for hh services. Cm contacte [facility] but they are unable to provide HHpt. Cm advised patient and she wants any other agency that will take her insurance. I called [facility] who can provide HHPT but will not be able to start services til after holidays on thurs 12/27. Care co-ordinator notified. Necessary infomation was faxed to [facility] at [phone]. pt wa advised and stated she would be able to exercise with her spouses help until therapy could be started.

## 2011-01-20 NOTE — Op Note (Signed)
NAMEGEORGENIA, [name] NO.:  [id number]  MEDICAL RECORD NO.:  [id number]  LOCATION:  [facility]                         FACILITY:  [facility]  PHYSICIAN:  Gabrielle [name], M.D.    DATE OF BIRTH:  August 23, 1945  DATE OF PROCEDURE: DATE OF DISCHARGE:                              OPERATIVE REPORT   PREOPERATIVE DIAGNOSES: 1. Degenerative joint disease, left knee. 2. Slight varus deformity.  POSTOPERATIVE DIAGNOSES: 1. Degenerative joint disease, left knee. 2. Slight varus deformity.  PROCEDURE PERFORMED:  Left total knee arthroplasty using DePuy components, 3 femur, 3 tibia, 10 mm insert, 35 patella.  ANESTHESIA:  General.  ASSISTANT:  Gabrielle [name], P.A. utilized for exposure, holding, retraction.  HISTORY:  A 65 year old with end-stage bone-on-bone arthritis of the left knee with varus deformity, indicated for replacement of degenerated joint, refractory to conservative treatment.  Risks and benefits were discussed including bleeding, infection, damage to neurovascular structure, DVT, PE, anesthetic complications, etc.  TECHNIQUE:  With the patient in supine position, after induction of adequate general anesthesia, 2 g Kefzol, left lower extremity was prepped and draped in the usual sterile fashion, exsanguinated, thigh tourniquet to 300 mm.  A midline incision was made with the knee flexed, with the skin full-thickness flaps developed.  Median and parapatellar arthrotomy performed.  Patella everted and knee flexed. 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  Remnants of the medial and lateral menisci removed as was the ACL.  We elevated soft tissue medially preserving the MCL attachment.  Step drill was utilized to enter femoral canal, was irrigated, 5 degree left placed __________, 11 off the distal femur, there was slight flexion contracture, distal femoral cut performed, sized distal femur off the anterior cortex to  a 3 and with the use of chamfer guide anterior, posterior, and chamfer cuts performed.  Soft tissue protected posteriorly at all times.  Attention turned towards the tibia.  External alignment guide bisecting the angle 4 off the defect which was medial.  Parallel to the anterior shaft of the tibia, pinned oscillating saw, performed a tibial cut, it was measured at 3. Measurement of the flexion extension gap and their equivalent was stable to varus valgus stresses.  Attention turned back towards completing the tibia with the tibia subluxed.  Tissues protected, sized to the 3 maximal coverage, just medial to the tibial tubercle.  This was then drilled at the end with the punch guide utilized.  We turned then our attention towards completing the distal femur with the box cut, bisected the previous cut in the condyles, performed a box cut with the jig, put a trial femur and tibia, 10 mm insert.  Full extension and full flexion at 140 degrees.  Good stability to varus/valgus stressing 0-30 degrees. Good patellofemoral tracking.  We prepared the patella, everted it, had a bipartite patella and so we planed it after measuring it 21 mm thickness to a 14, drilled the PEG holes medializing the patella, tracking, placed a trial patella.  Good flexion and good extension.  No lateral tracking.  No clunk.  Removed all trials, checked posteriorly, cauterized the geniculate and removed any residual remnants of the menisci.  Used pulsatile lavage on the bone surfaces, flexed the knee, and the keel was placed.  All surfaces dried, mixed the cement on the back table in appropriate fashion, injected it into the proximal tibia, digitally pressurized it, and then impacted the permanent tibial component and was cemented the femur, impacted that as well.  Redundant cement removed.  Trial 10 placed, reduced axial load applied throughout the curing of the cement.  We also cemented the patella and clamped it. After  curing the cement, had full flexion, full extension, good stability, varus/valgus stressing at 0-30 degrees.  Removed the trial insert and meticulously removed any redundant cement with an osteotome. Copiously irrigated the joint, placed a 10 permanent after trialing the 12, 10 was optimal in nature.  No soft tissue was intervening, reduced it, full extension, full flexion, stable.  Good stability to varus/valgus stressing at 0-30 degrees.  Hemovac placed, brought out through a lateral stab wound on the skin.  Repaired her patellar arthrotomy in slight flexion with #1 Vicryl and figure-of-eight sutures. The patient had flexion to gravity at 90 degrees, and good patellofemoral tracking after repairing the arthrotomy.  Subcu with 0 and 2-0 Vicryl simple sutures.  Skin was reapproximated with staples. Wound was dressed sterilely.  Tourniquet was deflated with adequate revascularization of lower extremity appreciated.  The patient tolerated the procedure well.  No complications.  Assistant, Gabrielle [name].  Tourniquet time was 100 minutes.     Gabrielle [name], M.D.     [name]  D:  01/19/2011  T:  01/19/2011  Job:  [id number]

## 2011-01-20 NOTE — Progress Notes (Signed)
Subjective: 1 Day Post-Op Procedure(s) (LRB): TOTAL KNEE ARTHROPLASTY (Left) Patient reports pain as 4 on 0-10 scale.  Denies CP or SOB  Patient has complaints of knee pain as expected.    We will start therapy today. Plan is to go home after hospital stay.  Objective: Vital signs in last 24 hours: Temp:  [97.4 F (36.3 C)-98 F (36.7 C)] 98 F (36.7 C) (12/21 0440) Pulse Rate:  [83-103] 83  (12/21 0440) Resp:  [11-20] 16  (12/21 0440) BP: (111-164)/(69-95) 118/78 mmHg (12/21 0440) SpO2:  [94 %-100 %] 100 % (12/21 0440) Weight:  [78.926 kg (174 lb)] 174 lb (78.926 kg) (12/20 1200)  Intake/Output from previous day:  Intake/Output Summary (Last 24 hours) at 01/20/11 0744 Last data filed at 01/20/11 0700  Gross per 24 hour  Intake   4800 ml  Output   2745 ml  Net   2055 ml    Intake/Output this shift:    Labs: Results for orders placed during the hospital encounter of 01/19/11  CBC      Component Value Range   WBC 11.5 (*) 4.0 - 10.5 (K/uL)   RBC 3.68 (*) 3.87 - 5.11 (MIL/uL)   Hemoglobin 10.6 (*) 12.0 - 15.0 (g/dL)   HCT 16.1 (*) 09.6 - 46.0 (%)   MCV 85.9  78.0 - 100.0 (fL)   MCH 28.8  26.0 - 34.0 (pg)   MCHC 33.5  30.0 - 36.0 (g/dL)   RDW 04.5  40.9 - 81.1 (%)   Platelets 202  150 - 400 (K/uL)  BASIC METABOLIC PANEL      Component Value Range   Sodium 135  135 - 145 (mEq/L)   Potassium 3.1 (*) 3.5 - 5.1 (mEq/L)   Chloride 98  96 - 112 (mEq/L)   CO2 28  19 - 32 (mEq/L)   Glucose, Bld 135 (*) 70 - 99 (mg/dL)   BUN 7  6 - 23 (mg/dL)   Creatinine, Ser 9.14  0.50 - 1.10 (mg/dL)   Calcium 9.1  8.4 - 78.2 (mg/dL)   GFR calc non Af Amer >90  >90 (mL/min)   GFR calc Af Amer >90  >90 (mL/min)    Exam - Neurovascular intact Sensation intact distally Dorsiflexion/Plantar flexion intact Compartment soft Dressing - clean, dry Motor function intact - moving foot and toes well on exam.  Hemovac pulled without difficulty.  Assessment/Plan: 1 Day Post-Op Procedure(s)  (LRB): TOTAL KNEE ARTHROPLASTY (Left)  Advance diet Up with therapy Past Medical History  Diagnosis Date  . Arthritis 01-17-11    degenerative disc disease-all joints  . Hypertension 01-17-11    tx. meds  . Cholesterol serum elevated 01-17-11    tx. meds  . GERD (gastroesophageal reflux disease) 01-17-11    tx. omeprazole  . Sinus problem 01-17-11    tx. Claritin D    DVT Prophylaxis - Xarelto Protocol Weight-Bearing as tolerated to left leg Wean from PCA d/c later today Dressing change tomorrow Discussed elevation with pt D/c likely Sunday if meets goals No vaccines.  Yianna Tersigni R. 01/20/2011, 7:44 AM

## 2011-01-21 LAB — CBC
HCT: 31.3 % — ABNORMAL LOW (ref 36.0–46.0)
Hemoglobin: 10.4 g/dL — ABNORMAL LOW (ref 12.0–15.0)
MCH: 28.7 pg (ref 26.0–34.0)
MCHC: 33.2 g/dL (ref 30.0–36.0)
MCV: 86.2 fL (ref 78.0–100.0)
Platelets: 184 10*3/uL (ref 150–400)
RBC: 3.63 MIL/uL — ABNORMAL LOW (ref 3.87–5.11)
RDW: 13.6 % (ref 11.5–15.5)
WBC: 9.4 10*3/uL (ref 4.0–10.5)

## 2011-01-21 MED ORDER — VITAMINS A & D EX OINT
TOPICAL_OINTMENT | CUTANEOUS | Status: AC
  Administered 2011-01-21: 5
  Filled 2011-01-21: qty 5

## 2011-01-21 MED ORDER — LIP MEDEX EX OINT
TOPICAL_OINTMENT | CUTANEOUS | Status: AC
  Administered 2011-01-21: 17:00:00
  Filled 2011-01-21: qty 7

## 2011-01-21 NOTE — Progress Notes (Signed)
Physical Therapy Treatment Patient Details Name: Gabrielle [name] MRN: [id number] DOB: 1945-05-07 Today's Date: 01/21/2011 9:43-10:15, te, gt  PT Assessment/Plan  PT - Assessment/Plan Comments on Treatment Session: Pt did well with PT session.  Pt reports she has been getting up all night to go to the bathroom.  Reviewed home exercise program with husband.  Discussed how to progress and mobility at home. Pt will be unable to get HHPT until Thursday 12/27.   PT Plan: Discharge plan remains appropriate PT Frequency: 7X/week Follow Up Recommendations: Home health PT Equipment Recommended: None recommended by PT PT Goals  Acute Rehab PT Goals Pt will go Sit to Stand: with modified independence PT Goal: Sit to Stand - Progress: Progressing toward goal Pt will go Stand to Sit: with modified independence PT Goal: Stand to Sit - Progress: Progressing toward goal Pt will Ambulate: >150 feet;with modified independence;with rolling walker PT Goal: Ambulate - Progress: Progressing toward goal Pt will Perform Home Exercise Program: with min assist PT Goal: Perform Home Exercise Program - Progress: Progressing toward goal  PT Treatment Precautions/Restrictions  Precautions Precautions: Knee Required Braces or Orthoses: Yes Restrictions Weight Bearing Restrictions: Yes LLE Weight Bearing: Weight bearing as tolerated Mobility (including Balance) Transfers Transfers: Yes Sit to Stand: 5: Supervision;From chair/3-in-1;With armrests;With upper extremity assist Stand to Sit: 5: Supervision;To bed;With upper extremity assist (husband assisting with L LE) Stand to Sit Details: husband assisted with extending pt's L LE Ambulation/Gait Ambulation/Gait Assistance Details (indicate cue type and reason): adjusted RW for pt's height Ambulation Distance (Feet): 65 Feet Assistive device: Rolling walker Gait Pattern: Step-to pattern    Exercise  Total Joint Exercises Ankle Circles/Pumps: 10  reps;AROM;Both;Seated Quad Sets: Strengthening;Left;10 reps Towel Squeeze: 10 reps;Seated Heel Slides: AAROM;Left;10 reps Hip ABduction/ADduction: 5 reps;Left;AAROM Straight Leg Raises: AAROM;Left;10 reps End of Session PT - End of Session Equipment Utilized During Treatment: Gait belt;Left knee immobilizer Activity Tolerance: Patient tolerated treatment well;Patient limited by pain Patient left: in chair;with call bell in reach;with family/visitor present Nurse Communication: Mobility status for transfers;Mobility status for ambulation General Behavior During Session: [facility] for tasks performed Cognition: [facility] for tasks performed  [facility] LUBECK 01/21/2011, 10:28 AM

## 2011-01-21 NOTE — Progress Notes (Signed)
Orthopedics Progress Note  Subjective: My knee is sore today but overall im doing well. Plan for discharge tomorrow  Objective:  Filed Vitals:   01/21/11 0607  BP: 148/72  Pulse: 97  Temp: 98.1 F (36.7 C)  Resp: 16    General: Awake and alert  Musculoskeletal: Left knee incision healing well, nv intact distally, dressing changed today Neurovascularly intact  Lab Results  Component Value Date   WBC 9.4 01/21/2011   HGB 10.4* 01/21/2011   HCT 31.3* 01/21/2011   MCV 86.2 01/21/2011   PLT 184 01/21/2011       Component Value Date/Time   NA 135 01/20/2011 0430   K 3.1* 01/20/2011 0430   CL 98 01/20/2011 0430   CO2 28 01/20/2011 0430   GLUCOSE 135* 01/20/2011 0430   BUN 7 01/20/2011 0430   CREATININE 0.60 01/20/2011 0430   CALCIUM 9.1 01/20/2011 0430   GFRNONAA >90 01/20/2011 0430   GFRAA >90 01/20/2011 0430    Lab Results  Component Value Date   INR 0.91 01/17/2011    Assessment/Plan: POD #2 s/p Procedure(s): TOTAL KNEE ARTHROPLASTY  Plan: Physical therapy and discharge tomorrow if doing well. Will continue to monitor hypokalemia  Almedia Balls. Ranell Patrick, MD 01/21/2011 7:55 AM

## 2011-01-22 LAB — CBC
HCT: 30.2 % — ABNORMAL LOW (ref 36.0–46.0)
Hemoglobin: 10 g/dL — ABNORMAL LOW (ref 12.0–15.0)
MCH: 28.4 pg (ref 26.0–34.0)
MCHC: 33.1 g/dL (ref 30.0–36.0)
MCV: 85.8 fL (ref 78.0–100.0)
Platelets: 199 10*3/uL (ref 150–400)
RBC: 3.52 MIL/uL — ABNORMAL LOW (ref 3.87–5.11)
RDW: 13.8 % (ref 11.5–15.5)
WBC: 8.4 10*3/uL (ref 4.0–10.5)

## 2011-01-22 NOTE — Progress Notes (Signed)
Cm spoke with patient concerning d/c planning. [facility] to provide HHPT. Provider scheduled to see pt 12/27. Cm to contact [facility]  Today to alert agency of pt d/c once pt leaves facility.  Weekend case manager [phone]

## 2011-01-22 NOTE — Progress Notes (Signed)
Patient without any complaints at this time. Discharged home with prescriptions and discharge instructions escorted to car via wheelchair .

## 2011-01-22 NOTE — Progress Notes (Signed)
Subjective: 3 Days Post-Op Procedure(s) (LRB): TOTAL KNEE ARTHROPLASTY (Left) Patient reports pain as mild.    Objective: Vital signs in last 24 hours: Temp:  [98.4 F (36.9 C)-99.2 F (37.3 C)] 98.7 F (37.1 C) (12/23 0634) Pulse Rate:  [95-106] 95  (12/23 0634) Resp:  [16-20] 20  (12/23 0634) BP: (127-149)/(70-84) 127/77 mmHg (12/23 0634) SpO2:  [97 %-100 %] 97 % (12/23 0634)  Intake/Output from previous day: 12/22 0701 - 12/23 0700 In: 3680 [P.O.:480; I.V.:3200] Out: 2900 [Urine:2900] Intake/Output this shift:     Basename 01/22/11 0532 01/21/11 0422 01/20/11 0430  HGB 10.0* 10.4* 10.6*    Basename 01/22/11 0532 01/21/11 0422  WBC 8.4 9.4  RBC 3.52* 3.63*  HCT 30.2* 31.3*  PLT 199 184    Basename 01/20/11 0430  NA 135  K 3.1*  CL 98  CO2 28  BUN 7  CREATININE 0.60  GLUCOSE 135*  CALCIUM 9.1   No results found for this basename: LABPT:2,INR:2 in the last 72 hours  Incision: no drainage  Assessment/Plan: 3 Days Post-Op Procedure(s) (LRB): TOTAL KNEE ARTHROPLASTY (Left) Discharge home with home health  Breionna Punt A 01/22/2011, 8:33 AM

## 2011-01-22 NOTE — Progress Notes (Signed)
Physical Therapy Treatment Patient Details Name: Gabrielle [name] MRN: [id number] DOB: January 27, 1946 Today's Date: 01/22/2011 9:35-10:05 G, TE  PT Assessment/Plan  PT - Assessment/Plan Comments on Treatment Session: REviewed stair training with pt's spouse. Did well with stairs, reviewed HEP, encouraged frequent ambulation. REady to DC home from PT standpoint. PT Plan: Discharge plan remains appropriate PT Frequency: 7X/week Follow Up Recommendations: Home health PT Equipment Recommended: None recommended by PT PT Goals  Acute Rehab PT Goals PT Goal Formulation: With patient/family Time For Goal Achievement: 4 days Pt will go Supine/Side to Sit: with modified independence PT Goal: Supine/Side to Sit - Progress: Progressing toward goal Pt will go Sit to Stand: with modified independence PT Goal: Sit to Stand - Progress: Met Pt will go Stand to Sit: with modified independence PT Goal: Stand to Sit - Progress: Met Pt will Ambulate: >150 feet;with modified independence;with rolling walker PT Goal: Ambulate - Progress: Progressing toward goal Pt will Perform Home Exercise Program: with min assist PT Goal: Perform Home Exercise Program - Progress: Met Additional Goals Additional Goal #1: Negotiate steps with MIN/guard A (new goal 12/22) PT Goal: Additional Goal #1 - Progress: Met  PT Treatment Precautions/Restrictions  Precautions Precautions: Knee Required Braces or Orthoses: Yes Knee Immobilizer: Discontinue once straight leg raise with < 10 degree lag Restrictions Weight Bearing Restrictions: Yes LLE Weight Bearing: Weight bearing as tolerated Mobility (including Balance) Bed Mobility Supine to Sit: 4: Min assist Supine to Sit Details (indicate cue type and reason): for LLE Transfers Transfers: Yes Sit to Stand: 6: Modified independent (Device/Increase time);From bed;With upper extremity assist Stand to Sit: 6: Modified independent (Device/Increase time);With armrests;To  chair/3-in-1 Ambulation/Gait Ambulation/Gait Assistance: 6: Modified independent (Device/Increase time) Ambulation Distance (Feet): 130 Feet Assistive device: Rolling walker Gait Pattern: Step-to pattern Stairs: Yes Stairs Assistance: 4: Min assist Stairs Assistance Details (indicate cue type and reason): assist to stabilize RW Stair Management Technique: Backwards;With walker;No rails Number of Stairs: 2     Exercise  Total Joint Exercises Ankle Circles/Pumps: 10 reps;AROM;Supine Quad Sets: Strengthening;Left;10 reps Heel Slides: AAROM;Left;10 reps Straight Leg Raises: AAROM;Left;10 reps End of Session PT - End of Session Equipment Utilized During Treatment: Gait belt;Left knee immobilizer Activity Tolerance: Patient tolerated treatment well;Patient limited by fatigue Patient left: in chair;with call bell in reach;with family/visitor present Nurse Communication: Mobility status for transfers;Mobility status for ambulation General Behavior During Session: [facility] for tasks performed Cognition: [facility] for tasks performed  Tamala Ser 01/22/2011, 10:11 AM Tamala Ser PT 01/22/2011  [phone]

## 2011-01-23 NOTE — Progress Notes (Signed)
Discharge summary sent to payer through [facility]  

## 2011-01-25 ENCOUNTER — Encounter (HOSPITAL_COMMUNITY): Payer: Self-pay | Admitting: Specialist

## 2011-02-02 NOTE — Discharge Summary (Signed)
Physician Discharge Summary  Patient ID: SHANESSA HODAK MRN: [id number] DOB/AGE: 66-Apr-1947 66 y.o.  Admit date: 01/19/2011 Discharge date: 02/02/2011  Admission Diagnoses: Hypertension, hyperlipidemia, GERD, and  osteoarthritis.  Discharge Diagnoses:  S/p left TKA Hypertension, hyperlipidemia, GERD, and  osteoarthritis.  Discharged Condition: good  Hospital Course:  Patient did well, visit was uncomplicated. See chart for details. Basename  01/22/11 0532  01/21/11 0422  01/20/11 0430   HGB  10.0*  10.4*  10.6*     Basename  01/22/11 0532  01/21/11 0422   WBC  8.4  9.4   RBC  3.52*  3.63*   HCT  30.2*  31.3*   PLT  199  184     Basename  01/20/11 0430   NA  135   K  3.1*   CL  98   CO2  28   BUN  7   CREATININE  0.60   GLUCOSE  135*   CALCIUM  9.1       Procedure Note: PROCEDURE: Procedure(s):  TOTAL KNEE ARTHROPLASTY  SURGEON: Surgeon(s):  Lacretia Leigh Beane  PHYSICIAN ASSISTANT:  ASSISTANTS: Dmari Schubring  ANESTHESIA: general  EBL: Total I/O  In: 1000 [I.V.:1000]  Out: 150 [Urine:150]   Consults: PT/OT  Disposition: Home-Health Care Svc  Discharge Orders    Future Orders Please Complete By Expires   Diet - low sodium heart healthy      Call MD / Call 911      Comments:   If you experience chest pain or shortness of breath, CALL 911 and be transported to the hospital emergency room.  If you develope a fever above 101 F, pus (white drainage) or increased drainage or redness at the wound, or calf pain, call your surgeon's office.   Constipation Prevention      Comments:   Drink plenty of fluids.  Prune juice may be helpful.  You may use a stool softener, such as Colace (over the counter) 100 mg twice a day.  Use MiraLax (over the counter) for constipation as needed.   Increase activity slowly as tolerated      Weight Bearing as taught in Physical Therapy      Comments:   Use a walker or crutches as instructed.   Discharge instructions      Comments:   Use knee immobilizer until can SLR x 10 Elevate above heart level at least 6 x a day for 20 minutes Daily dressing change OK to shower     TED hose      Comments:   Use stockings (TED hose) for 4 weeks on bilatera leg(s).  You may remove them at night for sleeping.   Change dressing      Comments:   change the dressing daily with sterile 4 x 4 inch gauze dressing and apply TED hose.     Discharge Medication List as of 01/22/2011 11:42 AM    START taking these medications   Details  methocarbamol (ROBAXIN) 500 MG tablet Take 1 tablet (500 mg total) by mouth every 6 (six) hours as needed., Starting 01/20/2011, Until Mon 01/30/11, Print    oxyCODONE (OXY IR/ROXICODONE) 5 MG immediate release tablet Take 1-2 tablets (5-10 mg total) by mouth every 3 (three) hours as needed., Starting 01/20/2011, Until Mon 01/30/11, Print    rivaroxaban (XARELTO) 10 MG TABS tablet Take 1 tablet (10 mg total) by mouth daily with breakfast., Starting 01/20/2011, Until Discontinued, Print      CONTINUE these medications which  have NOT CHANGED   Details  amLODipine (NORVASC) 5 MG tablet Take 5 mg by mouth every morning. , Until Discontinued, Historical Med    benazepril (LOTENSIN) 20 MG tablet Take 20 mg by mouth every morning.  , Until Discontinued, Historical Med    Calcium Carbonate-Vitamin D (CALCIUM 600 + D PO) Take 1 tablet by mouth 2 (two) times daily.  , Until Discontinued, Historical Med    docusate sodium (COLACE) 100 MG capsule Take 100 mg by mouth every morning. , Until Discontinued, Historical Med    fish oil-omega-3 fatty acids 1000 MG capsule Take 2 g by mouth at bedtime.  , Until Discontinued, Historical Med    levothyroxine (SYNTHROID, LEVOTHROID) 75 MCG tablet Take 75 mcg by mouth every morning. , Until Discontinued, Historical Med    loratadine-pseudoephedrine (CLARITIN-D 24-HOUR) 10-240 MG per 24 hr tablet Take 1 tablet by mouth daily.  , Until Discontinued, Historical Med      Melatonin 3 MG TABS Take 1 tablet by mouth at bedtime.  , Until Discontinued, Historical Med    omeprazole (PRILOSEC) 20 MG capsule Take 20 mg by mouth daily.  , Until Discontinued, Historical Med    polyethylene glycol (MIRALAX / GLYCOLAX) packet Take 17 g by mouth daily.  , Until Discontinued, Historical Med    potassium chloride (KLOR-CON) 10 MEQ CR tablet Take 10 mEq by mouth daily. , Until Discontinued, Historical Med    pravastatin (PRAVACHOL) 40 MG tablet Take 40 mg by mouth at bedtime. , Until Discontinued, Historical Med    triamterene-hydrochlorothiazide (DYAZIDE) 37.5-25 MG per capsule Take 1 capsule by mouth every morning. , Until Discontinued, Historical Med      STOP taking these medications     aspirin EC 81 MG tablet      Misc Natural Products (GLUCOSAMINE CHONDROITIN ADV PO)        Follow-up Information    Follow up with BEANE,JEFFREY C in 14 days.   Contact information:   [facility] [address] [place] [address] [phone]       Follow up with Mickie Hillier, MD in 1 week. (to check potassium and liver function as needed)    Contact information:   [address] [employer], [place]. [place] [address] [phone]       Follow up with [facility]. (Home Physical Therapy)    Contact information:   [phone]         Signed: Ranie Chinchilla R. 02/02/2011, 10:40 AM

## 2012-03-01 DIAGNOSIS — Z0000 Encounter for general adult medical examination without abnormal findings: Secondary | ICD-10-CM | POA: Diagnosis not present

## 2012-03-01 DIAGNOSIS — M899 Disorder of bone, unspecified: Secondary | ICD-10-CM | POA: Diagnosis not present

## 2012-03-01 DIAGNOSIS — R7401 Elevation of levels of liver transaminase levels: Secondary | ICD-10-CM | POA: Diagnosis not present

## 2012-03-01 DIAGNOSIS — I10 Essential (primary) hypertension: Secondary | ICD-10-CM | POA: Diagnosis not present

## 2012-03-01 DIAGNOSIS — R7402 Elevation of levels of lactic acid dehydrogenase (LDH): Secondary | ICD-10-CM | POA: Diagnosis not present

## 2012-03-01 DIAGNOSIS — E039 Hypothyroidism, unspecified: Secondary | ICD-10-CM | POA: Diagnosis not present

## 2012-03-01 DIAGNOSIS — E782 Mixed hyperlipidemia: Secondary | ICD-10-CM | POA: Diagnosis not present

## 2012-03-01 DIAGNOSIS — R7301 Impaired fasting glucose: Secondary | ICD-10-CM | POA: Diagnosis not present

## 2012-11-11 ENCOUNTER — Ambulatory Visit (INDEPENDENT_AMBULATORY_CARE_PROVIDER_SITE_OTHER): Payer: Medicare Other | Admitting: Diagnostic Neuroimaging

## 2012-11-11 ENCOUNTER — Encounter: Payer: Self-pay | Admitting: Diagnostic Neuroimaging

## 2012-11-11 VITALS — BP 124/67 | HR 93 | Temp 97.2°F | Ht 60.5 in | Wt 167.0 lb

## 2012-11-11 DIAGNOSIS — G629 Polyneuropathy, unspecified: Secondary | ICD-10-CM

## 2012-11-11 DIAGNOSIS — G589 Mononeuropathy, unspecified: Secondary | ICD-10-CM

## 2012-11-11 NOTE — Progress Notes (Signed)
[facility]  PATIENT: Gabrielle [name] DOB: [birth year]  REFERRING CLINICIAN: Little HISTORY FROM: patient and husband REASON FOR VISIT: new consult   HISTORICAL  CHIEF COMPLAINT:  Chief Complaint  Patient presents with  . Numbness    Np# 7    HISTORY OF PRESENT ILLNESS:   67 year old right-handed female with history of total knee replacements, L4-5 lumbar decompression and fusion,/arthritis, hypertension, hyperkalemia, here for evaluation of bilateral foot numbness and tingling and pain since 2009.  Patient describes pins and needles aching cold painful sensation in her feet, mainly on the bottom for the past 5 years. Symptoms gradually worsening over time. She also has some decreased sensitivity in her feet and sometimes cannot tell if her soft tissues are on or off. At other times when she touches small crumbs or irregularities on the floor cause severe pain.  Patient was diagnosed with lumbar radiculopathy few years ago status post surgery 2013. At that time she's having severe bilateral hip and sciatic pain which did improve following surgery. Pain in the feet has not significant changed to 4 after lumbar spine surgery.  Patient endorses drinking 2-3 glasses of wine on a daily basis for many years.  REVIEW OF SYSTEMS: Full 14 system review of systems performed and notable only for blurred vision joint pain allergies numbness.  ALLERGIES: Allergies  Allergen Reactions  . Penicillins Other (See Comments)    Had when was a child     HOME MEDICATIONS: Outpatient Prescriptions Prior to Visit  Medication Sig Dispense Refill  . amLODipine (NORVASC) 5 MG tablet Take 5 mg by mouth every morning.       . benazepril (LOTENSIN) 20 MG tablet Take 20 mg by mouth 2 (two) times daily.       . Calcium Carbonate-Vitamin D (CALCIUM 600 + D PO) Take 1 tablet by mouth 2 (two) times daily.        Marland Kitchen docusate sodium (COLACE) 100 MG capsule Take 100 mg by mouth every  morning.       . fish oil-omega-3 fatty acids 1000 MG capsule Take 1 g by mouth at bedtime.       Marland Kitchen levothyroxine (SYNTHROID, LEVOTHROID) 75 MCG tablet Take 75 mcg by mouth every morning.       . loratadine-pseudoephedrine (CLARITIN-D 24-HOUR) 10-240 MG per 24 hr tablet Take 1 tablet by mouth daily.        . Melatonin 3 MG TABS Take 1 tablet by mouth at bedtime.        Marland Kitchen omeprazole (PRILOSEC) 20 MG capsule Take 20 mg by mouth daily.        . polyethylene glycol (MIRALAX / GLYCOLAX) packet Take 17 g by mouth as needed.       . potassium chloride (KLOR-CON) 10 MEQ CR tablet Take 10 mEq by mouth daily.       . pravastatin (PRAVACHOL) 40 MG tablet Take 40 mg by mouth at bedtime.       . triamterene-hydrochlorothiazide (DYAZIDE) 37.5-25 MG per capsule Take 1 capsule by mouth every morning.       . rivaroxaban (XARELTO) 10 MG TABS tablet Take 1 tablet (10 mg total) by mouth daily with breakfast.  21 tablet  0   No facility-administered medications prior to visit.    PAST MEDICAL HISTORY: Past Medical History  Diagnosis Date  . Arthritis 01-17-11    degenerative disc disease-all joints  . Hypertension 01-17-11    tx. meds  . Cholesterol serum elevated  01-17-11    tx. meds  . GERD (gastroesophageal reflux disease) 01-17-11    tx. omeprazole  . Sinus problem 01-17-11    tx. Claritin D    PAST SURGICAL HISTORY: Past Surgical History  Procedure Laterality Date  . Abdominal hysterectomy  01-17-11    '93-Hysterectomy-heavy bleeding  . Carpal tunnel release  01-17-11    '02-left  . Joint replacement  01-17-11     RTKA' 02  . Back surgery  01-17-11    04-20-10-T-Lift lumbar fusion  . Tonsillectomy  01-17-11    child  . Burch procedure  01-17-11    Bladder sling  . Total knee arthroplasty  01/19/2011    Procedure: TOTAL KNEE ARTHROPLASTY;  Surgeon: Javier Docker;  Location: [facility];  Service: Orthopedics;  Laterality: Left;    FAMILY HISTORY: No family history on file.  SOCIAL  HISTORY:  History   Social History  . Marital Status: Married    Spouse Name: N/A    Number of Children: N/A  . Years of Education: N/A   Occupational History  . Not on file.   Social History Main Topics  . Smoking status: Never Smoker   . Smokeless tobacco: Not on file  . Alcohol Use: 1.2 oz/week    2 Glasses of wine per week     Comment: per day  . Drug Use: No  . Sexual Activity: Yes   Other Topics Concern  . Not on file   Social History Narrative  . No narrative on file     PHYSICAL EXAM  Filed Vitals:   11/11/12 1026  BP: 124/67  Pulse: 93  Temp: 97.2 F (36.2 C)  TempSrc: Oral  Height: 5' 0.5" (1.537 m)  Weight: 167 lb (75.751 kg)    Not recorded    Body mass index is 32.07 kg/(m^2).  GENERAL EXAM: Patient is in no distress; STRAIGHT LEG RAISE POSITIVE ON RIGHT AND LEFT SIDES.  CARDIOVASCULAR: Regular rate and rhythm, no murmurs, no carotid bruits; DUSKY COLOR ON BOTTOM OF FEET. DECR DP PULSES. CANNOT PALPATE POSTERIOR TIBIAL PULSES.  NEUROLOGIC: MENTAL STATUS: awake, alert, language fluent, comprehension intact, naming intact CRANIAL NERVE: no papilledema on fundoscopic exam, pupils equal and reactive to light, visual fields full to confrontation, extraocular muscles intact, no nystagmus, facial sensation and strength symmetric, uvula midline, shoulder shrug symmetric, tongue midline. MOTOR: normal bulk and tone, full strength in the BUE, BLE SENSORY: DECR PP, TEMP IN TOES. VIB < 5 SEC AT TOES. PROPRIO INTACT.  COORDINATION: finger-nose-finger, fine finger movements normal REFLEXES: BUE TRACE, BLE 0. MUTE TOES. GAIT/STATION: narrow based gait; SLOW, CAUTIOUS. Romberg is POSITIVE.   DIAGNOSTIC DATA (LABS, IMAGING, TESTING) - I reviewed patient records, labs, notes, testing and imaging myself where available.  Lab Results  Component Value Date   WBC 8.4 01/22/2011   HGB 10.0* 01/22/2011   HCT 30.2* 01/22/2011   MCV 85.8 01/22/2011   PLT 199  01/22/2011      Component Value Date/Time   NA 135 01/20/2011 0430   K 3.1* 01/20/2011 0430   CL 98 01/20/2011 0430   CO2 28 01/20/2011 0430   GLUCOSE 135* 01/20/2011 0430   BUN 7 01/20/2011 0430   CREATININE 0.60 01/20/2011 0430   CALCIUM 9.1 01/20/2011 0430   PROT 7.9 01/17/2011 1215   ALBUMIN 4.1 01/17/2011 1215   AST 36 01/17/2011 1215   ALT 71* 01/17/2011 1215   ALKPHOS 131* 01/17/2011 1215   BILITOT 0.3 01/17/2011 1215  GFRNONAA >90 01/20/2011 0430   GFRAA >90 01/20/2011 0430   No results found for this basename: CHOL, HDL, LDLCALC, LDLDIRECT, TRIG, CHOLHDL   No results found for this basename: HGBA1C   No results found for this basename: VITAMINB12   No results found for this basename: TSH    I reviewed images myself and agree with interpretation.  04/21/10 MRI lumbar spine - stable appearance of L4-5 fusion with interbody metallic spacer   ASSESSMENT AND PLAN  67 y.o. year old female here with bilateral foot pain since 2009. Progressively worsening. Exam suspicious for underlying peripheral neuropathy.  Ddx: neuropathy (metabolic, alcohol, inflamm, autoimmune, idiopathic) + lumbar radiculopathy  PLAN: - neuropath lab panel - advised patient to cut down on ETOH (currently 2-3 glasses wine daily) - start daily multivitamin - trial of compounded neuropathy cream   Orders Placed This Encounter  Procedures  . Neuropathy Panel   Return in about 4 months (around 03/14/2013) for with Heide Guile or Penumalli.    Suanne Marker, MD 11/11/2012, 11:56 AM Certified in Neurology, Neurophysiology and Neuroimaging  [facility] [address] [place], [place] [address] [phone]

## 2012-11-11 NOTE — Patient Instructions (Signed)
Try neuropathy cream. I will send in form and company will contact you about shipping cream to your home.

## 2012-11-26 ENCOUNTER — Other Ambulatory Visit (INDEPENDENT_AMBULATORY_CARE_PROVIDER_SITE_OTHER): Payer: Self-pay

## 2012-11-26 DIAGNOSIS — Z0289 Encounter for other administrative examinations: Secondary | ICD-10-CM

## 2012-11-28 LAB — NEUROPATHY PANEL
A/G Ratio: 1.4 (ref 0.7–2.0)
Albumin ELP: 4.2 g/dL (ref 3.2–5.6)
Alpha 1: 0.3 g/dL (ref 0.1–0.4)
Alpha 2: 0.9 g/dL (ref 0.4–1.2)
Angio Convert Enzyme: <14 U/L — ABNORMAL LOW (ref 14–82)
Anti Nuclear Antibody(ANA): NEGATIVE
Beta: 1 g/dL (ref 0.6–1.3)
Gamma Globulin: 0.9 g/dL (ref 0.5–1.6)
Globulin, Total: 3.1 g/dL (ref 2.0–4.5)
Rhuematoid fact SerPl-aCnc: 8 IU/mL (ref 0.0–13.9)
Sed Rate: 34 mm/hr (ref 0–40)
TSH: 3.22 u[IU]/mL (ref 0.450–4.500)
Total Protein: 7.3 g/dL (ref 6.0–8.5)
Vit D, 25-Hydroxy: 25.4 ng/mL — ABNORMAL LOW (ref 30.0–100.0)
Vitamin B-12: 279 pg/mL (ref 211–946)

## 2012-12-05 ENCOUNTER — Other Ambulatory Visit: Payer: Self-pay

## 2013-03-14 ENCOUNTER — Ambulatory Visit: Payer: Medicare Other | Admitting: Nurse Practitioner

## 2013-03-17 DIAGNOSIS — M76899 Other specified enthesopathies of unspecified lower limb, excluding foot: Secondary | ICD-10-CM | POA: Diagnosis not present

## 2013-03-19 ENCOUNTER — Other Ambulatory Visit: Payer: Self-pay | Admitting: Orthopedic Surgery

## 2013-03-19 DIAGNOSIS — M76899 Other specified enthesopathies of unspecified lower limb, excluding foot: Secondary | ICD-10-CM

## 2013-03-21 ENCOUNTER — Ambulatory Visit
Admission: RE | Admit: 2013-03-21 | Discharge: 2013-03-21 | Disposition: A | Discharge status: Home / Self Care | Payer: Medicare Other | Source: Ambulatory Visit | Attending: Orthopedic Surgery | Admitting: Orthopedic Surgery

## 2013-03-21 DIAGNOSIS — M76899 Other specified enthesopathies of unspecified lower limb, excluding foot: Secondary | ICD-10-CM

## 2013-03-21 DIAGNOSIS — M48061 Spinal stenosis, lumbar region without neurogenic claudication: Secondary | ICD-10-CM | POA: Diagnosis not present

## 2013-05-01 ENCOUNTER — Encounter (HOSPITAL_COMMUNITY): Payer: Self-pay | Admitting: Pharmacy Technician

## 2013-05-12 NOTE — Pre-Procedure Instructions (Signed)
Gabrielle [name]  05/12/2013   Your procedure is scheduled on: Thursday April, 16, 2015 at 7:30 AM  Report to [facility] (use Main Entrance "A'') at 5:30 AM.  Call this number if you have problems the morning of surgery: [phone]   Remember:  [name]   Do not eat food or drink liquids after midnight, Wednesday, May 14, 2013   Take these medicines the morning of surgery with A SIP OF WATER: amLODipine (NORVASC levothyroxine (SYNTHROID, LEVOTHROID),omeprazole (PRILOSEC)   Stop taking Aspirin, loratadine-pseudoephedrine (CLARITIN-D 24-HOUR  and herbal medications fish oil-omega-3 fatty acids, Melatonin. Do not take any NSAIDs ie: Ibuprofen, Advil, Naproxen or any medication containing Aspirin.  Do not wear jewelry, make-up or nail polish.  Do not wear lotions, powders, or perfumes. You may wear deodorant.  Do not shave 48 hours prior to surgery.  Do not bring valuables to the hospital.  [facility] is not responsible for any belongings or valuables.               Contacts, dentures or bridgework may not be worn into surgery.  Leave suitcase in the car. After surgery it may be brought to your room.  For patients admitted to the hospital, discharge time is determined by your treatment team.               Patients discharged the day of surgery will not be allowed to drive home.  Name and phone number of your driver:   Special Instructions:  Special Instructions:Special Instructions: [facility] - Preparing for Surgery  Before surgery, you can play an important role.  Because skin is not sterile, your skin needs to be as free of germs as possible.  You can reduce the number of germs on you skin by washing with CHG (chlorahexidine gluconate) soap before surgery.  CHG is an antiseptic cleaner which kills germs and bonds with the skin to continue killing germs even after washing.  Please DO NOT use if you have an allergy to CHG or antibacterial soaps.   If your skin becomes reddened/irritated stop using the CHG and inform your nurse when you arrive at Short Stay.  Do not shave (including legs and underarms) for at least 48 hours prior to the first CHG shower.  You may shave your face.  Please follow these instructions carefully:   1.  Shower with CHG Soap the night before surgery and the morning of Surgery.  2.  If you choose to wash your hair, wash your hair first as usual with your normal shampoo.  3.  After you shampoo, rinse your hair and body thoroughly to remove the Shampoo.  4.  Use CHG as you would any other liquid soap.  You can apply chg directly  to the skin and wash gently with scrungie or a clean washcloth.  5.  Apply the CHG Soap to your body ONLY FROM THE NECK DOWN.  Do not use on open wounds or open sores.  Avoid contact with your eyes, ears, mouth and genitals (private parts).  Wash genitals (private parts) with your normal soap.  6.  Wash thoroughly, paying special attention to the area where your surgery will be performed.  7.  Thoroughly rinse your body with warm water from the neck down.  8.  DO NOT shower/wash with your normal soap after using and rinsing off the CHG Soap.  9.  Pat yourself dry with a clean towel.  10.  Wear clean pajamas.            11.  Place clean sheets on your bed the night of your first shower and do not sleep with pets.  Day of Surgery  Do not apply any lotions the morning of surgery.  Please wear clean clothes to the hospital/surgery center.   Please read over the following fact sheets that you were given: Pain Booklet, Coughing and Deep Breathing, MRSA Information and Surgical Site Infection Prevention

## 2013-05-13 ENCOUNTER — Encounter (HOSPITAL_COMMUNITY): Payer: Self-pay

## 2013-05-13 ENCOUNTER — Ambulatory Visit (HOSPITAL_COMMUNITY)
Admission: RE | Admit: 2013-05-13 | Discharge: 2013-05-13 | Disposition: A | Discharge status: Home / Self Care | Payer: Medicare Other | Source: Ambulatory Visit | Attending: Anesthesiology | Admitting: Anesthesiology

## 2013-05-13 ENCOUNTER — Encounter (HOSPITAL_COMMUNITY)
Admission: RE | Admit: 2013-05-13 | Discharge: 2013-05-13 | Disposition: A | Discharge status: Home / Self Care | Payer: Medicare Other | Source: Ambulatory Visit | Attending: Orthopedic Surgery | Admitting: Orthopedic Surgery

## 2013-05-13 DIAGNOSIS — Z01810 Encounter for preprocedural cardiovascular examination: Secondary | ICD-10-CM | POA: Diagnosis not present

## 2013-05-13 DIAGNOSIS — Z01818 Encounter for other preprocedural examination: Secondary | ICD-10-CM | POA: Insufficient documentation

## 2013-05-13 DIAGNOSIS — Z01812 Encounter for preprocedural laboratory examination: Secondary | ICD-10-CM | POA: Diagnosis not present

## 2013-05-13 DIAGNOSIS — R918 Other nonspecific abnormal finding of lung field: Secondary | ICD-10-CM | POA: Diagnosis not present

## 2013-05-13 DIAGNOSIS — M5137 Other intervertebral disc degeneration, lumbosacral region: Secondary | ICD-10-CM | POA: Diagnosis not present

## 2013-05-13 HISTORY — DX: Other seasonal allergic rhinitis: J30.2

## 2013-05-13 HISTORY — DX: Cardiac murmur, unspecified: R01.1

## 2013-05-13 LAB — BASIC METABOLIC PANEL
BUN: 28 mg/dL — ABNORMAL HIGH (ref 6–23)
CO2: 23 mEq/L (ref 19–32)
Calcium: 9.4 mg/dL (ref 8.4–10.5)
Chloride: 97 mEq/L (ref 96–112)
Creatinine, Ser: 1.05 mg/dL (ref 0.50–1.10)
GFR calc Af Amer: 62 mL/min — ABNORMAL LOW (ref >90)
GFR calc non Af Amer: 54 mL/min — ABNORMAL LOW (ref >90)
Glucose, Bld: 104 mg/dL — ABNORMAL HIGH (ref 70–99)
Potassium: 4.1 mEq/L (ref 3.7–5.3)
Sodium: 137 mEq/L (ref 137–147)

## 2013-05-13 LAB — CBC
HCT: 37.5 % (ref 36.0–46.0)
Hemoglobin: 12.9 g/dL (ref 12.0–15.0)
MCH: 31 pg (ref 26.0–34.0)
MCHC: 34.4 g/dL (ref 30.0–36.0)
MCV: 90.1 fL (ref 78.0–100.0)
Platelets: 223 10*3/uL (ref 150–400)
RBC: 4.16 MIL/uL (ref 3.87–5.11)
RDW: 13.2 % (ref 11.5–15.5)
WBC: 6.9 10*3/uL (ref 4.0–10.5)

## 2013-05-13 LAB — SURGICAL PCR SCREEN
MRSA, PCR: NEGATIVE
Staphylococcus aureus: NEGATIVE

## 2013-05-14 ENCOUNTER — Encounter (HOSPITAL_COMMUNITY): Payer: Self-pay

## 2013-05-14 MED ORDER — DEXAMETHASONE SODIUM PHOSPHATE 4 MG/ML IJ SOLN
4.0000 mg | Freq: Once | INTRAMUSCULAR | Status: AC
  Administered 2013-05-15: 10 mg via INTRAVENOUS
  Filled 2013-05-14: qty 1

## 2013-05-14 MED ORDER — ACETAMINOPHEN 10 MG/ML IV SOLN
1000.0000 mg | Freq: Four times a day (QID) | INTRAVENOUS | Status: DC
  Administered 2013-05-15: 1000 mg via INTRAVENOUS
  Filled 2013-05-14: qty 100

## 2013-05-14 NOTE — Progress Notes (Signed)
Anesthesia chart review: Patient is a 68 year old female scheduled for removal of hardware on 05/15/13 by Dr. Rolena Infante.  History includes hypertension, hypothyroidism, arthritis, hypercholesterolemia, GERD, hysterectomy, tonsillectomy, former smoker, heart murmur ("slight" otherwise not specified), lumbar fusion, left TKA 12/2010. BMI is 32 consistent with obesity. PCP is listed as Dr. Hulan Fess.   She reported previous pre-operative cardiology evaluation By Dr. Sallyanne Kuster in 03/2010 at the formerly known [facility] now a [facility].  A stress test was done on 03/18/10 and showed: Perfusion defect in inferior myocardial region consistent with diaphragmatic attenuation. The remaining myocardium demonstrates normal myocardial perfusion with no evidence of ischemia or infarct. The post stress left ventricle is normal in size. At this stress EF is 81%. Impression: Normal myocardial perfusion scan demonstrating an attenuation artifact in inferior region of the myocardium. No ischemia or infarct/scar is seen in the remaining myocardium. Compared to the previous study, there is no significant change. This is a low risk scan. There was no record of an echocardiogram at Dr. Victorino December office.  EKG on 05/13/13 showed: NSR, LAE, LAD, LVH.  It was not felt significantly changed since her last tracing in [facility] from 03/23/99.    Preoperative CXR and labs noted.  If no acute changes then I would anticipate that she could proceed as planned.    [facility] Short Stay Center/Anesthesiology Phone [phone] 05/14/2013 2:34 PM

## 2013-05-15 ENCOUNTER — Encounter (HOSPITAL_COMMUNITY)
Admission: RE | Disposition: A | Discharge status: Home / Self Care | Payer: Self-pay | Source: Ambulatory Visit | Attending: Orthopedic Surgery

## 2013-05-15 ENCOUNTER — Encounter (HOSPITAL_COMMUNITY): Payer: Self-pay | Admitting: Anesthesiology

## 2013-05-15 ENCOUNTER — Encounter (HOSPITAL_COMMUNITY): Payer: Medicare Other | Admitting: Vascular Surgery

## 2013-05-15 ENCOUNTER — Ambulatory Visit (HOSPITAL_COMMUNITY)
Admission: RE | Admit: 2013-05-15 | Discharge: 2013-05-15 | Disposition: A | Discharge status: Home / Self Care | Payer: Medicare Other | Source: Ambulatory Visit | Attending: Orthopedic Surgery | Admitting: Orthopedic Surgery

## 2013-05-15 ENCOUNTER — Ambulatory Visit (HOSPITAL_COMMUNITY): Payer: Medicare Other

## 2013-05-15 ENCOUNTER — Ambulatory Visit (HOSPITAL_COMMUNITY): Payer: Medicare Other | Admitting: Anesthesiology

## 2013-05-15 DIAGNOSIS — T84039A Mechanical loosening of unspecified internal prosthetic joint, initial encounter: Secondary | ICD-10-CM | POA: Diagnosis not present

## 2013-05-15 DIAGNOSIS — K219 Gastro-esophageal reflux disease without esophagitis: Secondary | ICD-10-CM | POA: Diagnosis not present

## 2013-05-15 DIAGNOSIS — Z981 Arthrodesis status: Secondary | ICD-10-CM | POA: Insufficient documentation

## 2013-05-15 DIAGNOSIS — Z87891 Personal history of nicotine dependence: Secondary | ICD-10-CM | POA: Insufficient documentation

## 2013-05-15 DIAGNOSIS — Z96659 Presence of unspecified artificial knee joint: Secondary | ICD-10-CM | POA: Insufficient documentation

## 2013-05-15 DIAGNOSIS — E039 Hypothyroidism, unspecified: Secondary | ICD-10-CM | POA: Insufficient documentation

## 2013-05-15 DIAGNOSIS — M1990 Unspecified osteoarthritis, unspecified site: Secondary | ICD-10-CM | POA: Insufficient documentation

## 2013-05-15 DIAGNOSIS — M519 Unspecified thoracic, thoracolumbar and lumbosacral intervertebral disc disorder: Secondary | ICD-10-CM | POA: Diagnosis not present

## 2013-05-15 DIAGNOSIS — T84498A Other mechanical complication of other internal orthopedic devices, implants and grafts, initial encounter: Secondary | ICD-10-CM | POA: Diagnosis not present

## 2013-05-15 DIAGNOSIS — Y831 Surgical operation with implant of artificial internal device as the cause of abnormal reaction of the patient, or of later complication, without mention of misadventure at the time of the procedure: Secondary | ICD-10-CM | POA: Insufficient documentation

## 2013-05-15 DIAGNOSIS — I10 Essential (primary) hypertension: Secondary | ICD-10-CM | POA: Insufficient documentation

## 2013-05-15 DIAGNOSIS — Z7982 Long term (current) use of aspirin: Secondary | ICD-10-CM | POA: Insufficient documentation

## 2013-05-15 DIAGNOSIS — I739 Peripheral vascular disease, unspecified: Secondary | ICD-10-CM | POA: Insufficient documentation

## 2013-05-15 DIAGNOSIS — E7800 Pure hypercholesterolemia, unspecified: Secondary | ICD-10-CM | POA: Insufficient documentation

## 2013-05-15 DIAGNOSIS — Z9889 Other specified postprocedural states: Secondary | ICD-10-CM

## 2013-05-15 HISTORY — PX: HARDWARE REMOVAL: SHX979

## 2013-05-15 SURGERY — REMOVAL, HARDWARE
Anesthesia: General | Site: Back | Laterality: Right

## 2013-05-15 MED ORDER — ONDANSETRON HCL 4 MG/2ML IJ SOLN
INTRAMUSCULAR | Status: DC | PRN
  Administered 2013-05-15: 4 mg via INTRAVENOUS

## 2013-05-15 MED ORDER — ARTIFICIAL TEARS OP OINT
TOPICAL_OINTMENT | OPHTHALMIC | Status: DC | PRN
  Administered 2013-05-15: 1 via OPHTHALMIC

## 2013-05-15 MED ORDER — ROCURONIUM BROMIDE 50 MG/5ML IV SOLN
INTRAVENOUS | Status: AC
  Filled 2013-05-15: qty 1

## 2013-05-15 MED ORDER — PHENYLEPHRINE HCL 10 MG/ML IJ SOLN
INTRAMUSCULAR | Status: DC | PRN
  Administered 2013-05-15: 120 ug via INTRAVENOUS
  Administered 2013-05-15: 160 ug via INTRAVENOUS
  Administered 2013-05-15 (×2): 120 ug via INTRAVENOUS
  Administered 2013-05-15: 200 ug via INTRAVENOUS
  Administered 2013-05-15: 80 ug via INTRAVENOUS

## 2013-05-15 MED ORDER — BUPIVACAINE-EPINEPHRINE (PF) 0.25% -1:200000 IJ SOLN
INTRAMUSCULAR | Status: AC
  Filled 2013-05-15: qty 30

## 2013-05-15 MED ORDER — MEPERIDINE HCL 25 MG/ML IJ SOLN: 6.2500 mg | INTRAMUSCULAR | Status: DC | PRN

## 2013-05-15 MED ORDER — LIDOCAINE HCL (CARDIAC) 20 MG/ML IV SOLN
INTRAVENOUS | Status: AC
  Filled 2013-05-15: qty 5

## 2013-05-15 MED ORDER — PHENYLEPHRINE 40 MCG/ML (10ML) SYRINGE FOR IV PUSH (FOR BLOOD PRESSURE SUPPORT)
PREFILLED_SYRINGE | INTRAVENOUS | Status: AC
  Filled 2013-05-15: qty 10

## 2013-05-15 MED ORDER — ROCURONIUM BROMIDE 100 MG/10ML IV SOLN
INTRAVENOUS | Status: DC | PRN
  Administered 2013-05-15: 50 mg via INTRAVENOUS

## 2013-05-15 MED ORDER — FENTANYL CITRATE 0.05 MG/ML IJ SOLN
INTRAMUSCULAR | Status: DC | PRN
Start: 2013-05-15 — End: 2013-05-15
  Administered 2013-05-15: 150 ug via INTRAVENOUS

## 2013-05-15 MED ORDER — HYDROMORPHONE HCL PF 1 MG/ML IJ SOLN
INTRAMUSCULAR | Status: AC
  Administered 2013-05-15: 0.5 mg via INTRAVENOUS
  Filled 2013-05-15: qty 1

## 2013-05-15 MED ORDER — NEOSTIGMINE METHYLSULFATE 1 MG/ML IJ SOLN
INTRAMUSCULAR | Status: DC | PRN
  Administered 2013-05-15: 4 mg via INTRAVENOUS

## 2013-05-15 MED ORDER — ONDANSETRON HCL 4 MG/2ML IJ SOLN: 4.0000 mg | Freq: Once | INTRAMUSCULAR | Status: DC | PRN

## 2013-05-15 MED ORDER — THROMBIN 20000 UNITS EX SOLR
CUTANEOUS | Status: AC
  Filled 2013-05-15: qty 20000

## 2013-05-15 MED ORDER — OXYCODONE HCL 5 MG PO TABS: 5.0000 mg | ORAL_TABLET | Freq: Once | ORAL | Status: DC | PRN

## 2013-05-15 MED ORDER — METHOCARBAMOL 500 MG PO TABS
500.0000 mg | ORAL_TABLET | Freq: Four times a day (QID) | ORAL | Status: DC | PRN
  Administered 2013-05-15: 500 mg via ORAL

## 2013-05-15 MED ORDER — FENTANYL CITRATE 0.05 MG/ML IJ SOLN
INTRAMUSCULAR | Status: AC
  Filled 2013-05-15: qty 5

## 2013-05-15 MED ORDER — VANCOMYCIN HCL IN DEXTROSE 1-5 GM/200ML-% IV SOLN: 1000.0000 mg | Freq: Two times a day (BID) | INTRAVENOUS | Status: DC

## 2013-05-15 MED ORDER — METHOCARBAMOL 100 MG/ML IJ SOLN: 500.0000 mg | Freq: Four times a day (QID) | INTRAVENOUS | Status: DC | PRN

## 2013-05-15 MED ORDER — GLYCOPYRROLATE 0.2 MG/ML IJ SOLN
INTRAMUSCULAR | Status: AC
  Filled 2013-05-15: qty 3

## 2013-05-15 MED ORDER — ARTIFICIAL TEARS OP OINT
TOPICAL_OINTMENT | OPHTHALMIC | Status: AC
  Filled 2013-05-15: qty 3.5

## 2013-05-15 MED ORDER — MIDAZOLAM HCL 5 MG/5ML IJ SOLN
INTRAMUSCULAR | Status: DC | PRN
  Administered 2013-05-15: 2 mg via INTRAVENOUS

## 2013-05-15 MED ORDER — OXYCODONE HCL 5 MG/5ML PO SOLN: 5.0000 mg | Freq: Once | ORAL | Status: DC | PRN

## 2013-05-15 MED ORDER — GLYCOPYRROLATE 0.2 MG/ML IJ SOLN
INTRAMUSCULAR | Status: DC | PRN
  Administered 2013-05-15: .8 mg via INTRAVENOUS

## 2013-05-15 MED ORDER — OXYCODONE HCL 5 MG PO TABS
ORAL_TABLET | ORAL | Status: AC
  Administered 2013-05-15: 10 mg via ORAL
  Filled 2013-05-15: qty 2

## 2013-05-15 MED ORDER — OXYCODONE HCL 5 MG PO TABS
10.0000 mg | ORAL_TABLET | ORAL | Status: DC | PRN
  Administered 2013-05-15: 10 mg via ORAL

## 2013-05-15 MED ORDER — METHOCARBAMOL 500 MG PO TABS
ORAL_TABLET | ORAL | Status: AC
  Administered 2013-05-15: 500 mg via ORAL
  Filled 2013-05-15: qty 1

## 2013-05-15 MED ORDER — MIDAZOLAM HCL 2 MG/2ML IJ SOLN
INTRAMUSCULAR | Status: AC
  Filled 2013-05-15: qty 2

## 2013-05-15 MED ORDER — BUPIVACAINE-EPINEPHRINE 0.25% -1:200000 IJ SOLN
INTRAMUSCULAR | Status: DC | PRN
  Administered 2013-05-15: 20 mL

## 2013-05-15 MED ORDER — METHOCARBAMOL 500 MG PO TABS: 500.0000 mg | ORAL_TABLET | Freq: Four times a day (QID) | ORAL | Status: DC | PRN

## 2013-05-15 MED ORDER — LACTATED RINGERS IV SOLN
INTRAVENOUS | Status: DC | PRN
  Administered 2013-05-15 (×2): via INTRAVENOUS

## 2013-05-15 MED ORDER — PROPOFOL 10 MG/ML IV BOLUS
INTRAVENOUS | Status: AC
  Filled 2013-05-15: qty 20

## 2013-05-15 MED ORDER — SUCCINYLCHOLINE CHLORIDE 20 MG/ML IJ SOLN
INTRAMUSCULAR | Status: AC
  Filled 2013-05-15: qty 1

## 2013-05-15 MED ORDER — OXYCODONE-ACETAMINOPHEN 7.5-325 MG PO TABS: 1.0000 | ORAL_TABLET | Freq: Four times a day (QID) | ORAL | Status: DC | PRN

## 2013-05-15 MED ORDER — LIDOCAINE HCL (CARDIAC) 20 MG/ML IV SOLN
INTRAVENOUS | Status: DC | PRN
  Administered 2013-05-15: 100 mg via INTRAVENOUS

## 2013-05-15 MED ORDER — NEOSTIGMINE METHYLSULFATE 1 MG/ML IJ SOLN
INTRAMUSCULAR | Status: AC
  Filled 2013-05-15: qty 10

## 2013-05-15 MED ORDER — SODIUM CHLORIDE 0.9 % IJ SOLN
INTRAMUSCULAR | Status: AC
  Filled 2013-05-15: qty 10

## 2013-05-15 MED ORDER — ONDANSETRON 4 MG PO TBDP: 4.0000 mg | ORAL_TABLET | Freq: Three times a day (TID) | ORAL | Status: DC | PRN

## 2013-05-15 MED ORDER — ONDANSETRON HCL 4 MG/2ML IJ SOLN
INTRAMUSCULAR | Status: AC
  Filled 2013-05-15: qty 2

## 2013-05-15 MED ORDER — THROMBIN 20000 UNITS EX SOLR
CUTANEOUS | Status: DC | PRN
  Administered 2013-05-15: 08:00:00 via TOPICAL

## 2013-05-15 MED ORDER — EPHEDRINE SULFATE 50 MG/ML IJ SOLN
INTRAMUSCULAR | Status: AC
  Filled 2013-05-15: qty 1

## 2013-05-15 MED ORDER — VANCOMYCIN HCL IN DEXTROSE 1-5 GM/200ML-% IV SOLN
INTRAVENOUS | Status: AC
  Administered 2013-05-15: 1000 mg via INTRAVENOUS
  Filled 2013-05-15: qty 200

## 2013-05-15 MED ORDER — HYDROMORPHONE HCL PF 1 MG/ML IJ SOLN
0.2500 mg | INTRAMUSCULAR | Status: DC | PRN
  Administered 2013-05-15 (×4): 0.5 mg via INTRAVENOUS

## 2013-05-15 MED ORDER — PROPOFOL 10 MG/ML IV BOLUS
INTRAVENOUS | Status: DC | PRN
  Administered 2013-05-15: 125 mg via INTRAVENOUS

## 2013-05-15 SURGICAL SUPPLY — 58 items
BLADE SURG ROTATE 9660 (MISCELLANEOUS) IMPLANT
BUR EGG ELITE 4.0 (BURR) IMPLANT
BUR EGG ELITE 4.0MM (BURR)
CLOSURE STERI-STRIP 1/2X4 (GAUZE/BANDAGES/DRESSINGS) ×1
CLOSURE WOUND 1/2 X4 (GAUZE/BANDAGES/DRESSINGS) ×1
CLSR STERI-STRIP ANTIMIC 1/2X4 (GAUZE/BANDAGES/DRESSINGS) ×2 IMPLANT
CORDS BIPOLAR (ELECTRODE) ×3 IMPLANT
COVER MAYO STAND STRL (DRAPES) ×3 IMPLANT
COVER SURGICAL LIGHT HANDLE (MISCELLANEOUS) ×3 IMPLANT
DRAPE C-ARM 42X72 X-RAY (DRAPES) ×3 IMPLANT
DRAPE C-ARMOR (DRAPES) IMPLANT
DRAPE ORTHO SPLIT 77X108 STRL (DRAPES) ×2
DRAPE POUCH INSTRU U-SHP 10X18 (DRAPES) ×3 IMPLANT
DRAPE SURG 17X23 STRL (DRAPES) ×3 IMPLANT
DRAPE SURG ORHT 6 SPLT 77X108 (DRAPES) ×1 IMPLANT
DRAPE U-SHAPE 47X51 STRL (DRAPES) ×3 IMPLANT
DRSG MEPILEX BORDER 4X8 (GAUZE/BANDAGES/DRESSINGS) ×3 IMPLANT
DURAPREP 26ML APPLICATOR (WOUND CARE) ×3 IMPLANT
ELECT BLADE 4.0 EZ CLEAN MEGAD (MISCELLANEOUS) ×3
ELECT BLADE 6.5 EXT (BLADE) ×3 IMPLANT
ELECT REM PT RETURN 9FT ADLT (ELECTROSURGICAL) ×3
ELECTRODE BLDE 4.0 EZ CLN MEGD (MISCELLANEOUS) ×1 IMPLANT
ELECTRODE REM PT RTRN 9FT ADLT (ELECTROSURGICAL) ×1 IMPLANT
GLOVE BIOGEL PI IND STRL 8 (GLOVE) ×1 IMPLANT
GLOVE BIOGEL PI IND STRL 8.5 (GLOVE) ×1 IMPLANT
GLOVE BIOGEL PI INDICATOR 8 (GLOVE) ×2
GLOVE BIOGEL PI INDICATOR 8.5 (GLOVE) ×2
GLOVE ECLIPSE 8.5 STRL (GLOVE) ×3 IMPLANT
GLOVE ORTHO TXT STRL SZ7.5 (GLOVE) ×3 IMPLANT
GOWN STRL REUS W/ TWL LRG LVL3 (GOWN DISPOSABLE) ×1 IMPLANT
GOWN STRL REUS W/TWL 2XL LVL3 (GOWN DISPOSABLE) ×6 IMPLANT
GOWN STRL REUS W/TWL LRG LVL3 (GOWN DISPOSABLE) ×2
KIT BASIN OR (CUSTOM PROCEDURE TRAY) ×3 IMPLANT
KIT POSITION SURG JACKSON T1 (MISCELLANEOUS) ×3 IMPLANT
KIT ROOM TURNOVER OR (KITS) ×3 IMPLANT
NEEDLE 22X1 1/2 (OR ONLY) (NEEDLE) ×3 IMPLANT
NEEDLE SPNL 18GX3.5 QUINCKE PK (NEEDLE) ×6 IMPLANT
NS IRRIG 1000ML POUR BTL (IV SOLUTION) ×3 IMPLANT
PACK LAMINECTOMY ORTHO (CUSTOM PROCEDURE TRAY) ×3 IMPLANT
PACK UNIVERSAL I (CUSTOM PROCEDURE TRAY) ×3 IMPLANT
PAD ARMBOARD 7.5X6 YLW CONV (MISCELLANEOUS) ×6 IMPLANT
PATTIES SURGICAL .5 X.5 (GAUZE/BANDAGES/DRESSINGS) IMPLANT
PATTIES SURGICAL .5 X1 (DISPOSABLE) ×3 IMPLANT
SPONGE LAP 4X18 X RAY DECT (DISPOSABLE) ×3 IMPLANT
SPONGE SURGIFOAM ABS GEL 100 (HEMOSTASIS) ×3 IMPLANT
STRIP CLOSURE SKIN 1/2X4 (GAUZE/BANDAGES/DRESSINGS) ×2 IMPLANT
SURGIFLO TRUKIT (HEMOSTASIS) IMPLANT
SUT MON AB 3-0 SH 27 (SUTURE) ×2
SUT MON AB 3-0 SH27 (SUTURE) ×1 IMPLANT
SUT VIC AB 1 CTX 18 (SUTURE) ×3 IMPLANT
SUT VIC AB 2-0 CT1 18 (SUTURE) ×3 IMPLANT
SYR BULB IRRIGATION 50ML (SYRINGE) ×3 IMPLANT
SYR CONTROL 10ML LL (SYRINGE) ×3 IMPLANT
TOWEL OR 17X24 6PK STRL BLUE (TOWEL DISPOSABLE) ×3 IMPLANT
TOWEL OR 17X26 10 PK STRL BLUE (TOWEL DISPOSABLE) ×3 IMPLANT
TRAY FOLEY CATH 16FRSI W/METER (SET/KITS/TRAYS/PACK) IMPLANT
WATER STERILE IRR 1000ML POUR (IV SOLUTION) ×3 IMPLANT
YANKAUER SUCT BULB TIP NO VENT (SUCTIONS) ×3 IMPLANT

## 2013-05-15 NOTE — Anesthesia Postprocedure Evaluation (Signed)
Anesthesia Post Note  Patient: Gabrielle [name]  Procedure(s) Performed: Procedure(s) (LRB): REMOVAL RIGHT L4-L5 PEDICLE SCREWS AND ROD  (Right)  Anesthesia type: general  Patient location: PACU  Post pain: Pain level controlled  Post assessment: Patient's Cardiovascular Status Stable  Last Vitals:  Filed Vitals:   05/15/13 0945  BP: 113/62  Pulse: 65  Temp: 36.7 C  Resp: 12    Post vital signs: Reviewed and stable  Level of consciousness: sedated  Complications: No apparent anesthesia complications

## 2013-05-15 NOTE — Brief Op Note (Signed)
05/15/2013  8:27 AM  PATIENT:  Gabrielle [name]  68 y.o. female  PRE-OPERATIVE DIAGNOSIS:  SYMPTOMATIC HARDWARE LUMBAR SPINE  POST-OPERATIVE DIAGNOSIS:  symptomatic hardware lumbar spine  PROCEDURE:  Procedure(s): REMOVAL RIGHT L4-L5 PEDICLE SCREWS AND ROD  (Right)  SURGEON:  Surgeon(s) and Role:    * Melina Schools, MD - Primary  PHYSICIAN ASSISTANT:   ASSISTANTS: Benjiman Core   ANESTHESIA:   general  EBL:  Total I/O In: 1200 [I.V.:1200] Out: -   BLOOD ADMINISTERED:none  DRAINS: none   LOCAL MEDICATIONS USED:  MARCAINE     SPECIMEN:  No Specimen  DISPOSITION OF SPECIMEN:  N/A  COUNTS:  YES  TOURNIQUET:  * No tourniquets in log *  DICTATION: .Other Dictation: Dictation Number [id number]  PLAN OF CARE: Discharge to home after PACU  PATIENT DISPOSITION:  PACU - hemodynamically stable.

## 2013-05-15 NOTE — Anesthesia Preprocedure Evaluation (Addendum)
Anesthesia Evaluation  Patient identified by MRN, date of birth, ID band Patient awake    Reviewed: Allergy & Precautions, H&P , NPO status , Patient's Chart, lab work & pertinent test results, reviewed documented beta blocker date and time   Airway Mallampati: II TM Distance: >3 FB Neck ROM: Full    Dental  (+) Teeth Intact, Dental Advidsory Given   Pulmonary former smoker,          Cardiovascular hypertension, Pt. on medications     Neuro/Psych    GI/Hepatic GERD-  Medicated and Controlled,  Endo/Other    Renal/GU      Musculoskeletal   Abdominal   Peds  Hematology   Anesthesia Other Findings   Reproductive/Obstetrics                          Anesthesia Physical Anesthesia Plan  ASA: II  Anesthesia Plan: General   Post-op Pain Management:    Induction: Intravenous  Airway Management Planned: Oral ETT  Additional Equipment:   Intra-op Plan:   Post-operative Plan: Extubation in OR  Informed Consent: I have reviewed the patients History and Physical, chart, labs and discussed the procedure including the risks, benefits and alternatives for the proposed anesthesia with the patient or authorized representative who has indicated his/her understanding and acceptance.   Dental Advisory Given  Plan Discussed with: CRNA and Surgeon  Anesthesia Plan Comments:        Anesthesia Quick Evaluation

## 2013-05-15 NOTE — H&P (Addendum)
History of Present Illness  The patient is a 68 year old female who presents for a recheck of Follow-up back. The patient is being followed for their back pain. They are now 2 months 2 weeks 1 day out from bilateral trochanteric bursa injections on 12/30/12 (right side did great, left side not so much). Symptoms reported today include: pain, difficulty ambulating (up hills and steps), numbness and leg pain, while the patient does not report symptoms of: pain at night or pain with lying. The following medication has been used for pain control: none. The patient reports their current pain level to be 73 / 50.  68 year old white female with a history of low back pain and bilateral hip returns. She states that her left hip is currently doing better after the greater trochanteric bursa injection. She had some soreness in the right lateral hip. She also is status post L4-5 PLIF in 2012 with migration of the rod on the right. She is having pain in her back. No complaints of bowel or bladder problems.    Problem List/Past Medical Aftercare following surgery of the musculoskeletal system (V58.78) Bursitis, hip (726.5) Tremor (781.0) Trigger Finger (727.03) Impingement Syndrome (726.2) Syndrome, rotator cuff NOS (726.10). 01/10/1999 Syndrome, carpal tunnel (354.0). 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 09/25/2000 Synovitis NEC (727.09). 11/14/2000 Tear, lateral meniscus, knee, current (836.1). 02/11/2001    Allergies Penicillin G Benzathine & Proc *PENICILLINS*    Family History Heart disease in female family member before age 40 Diabetes Mellitus. brother Heart Disease. First Degree Relatives. father and brother    Social HistoryChildren. 3 Pain Contract. no Living situation. live with spouse Drug/Alcohol Rehab (Currently). no Tobacco use. Former smoker. former smoker Alcohol use. Currently drinks alcohol. current drinker; drinks wine; 8-14 per  week Tobacco / smoke exposure. no Number of flights of stairs before winded. 2-3 Illicit drug use. 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 married Exercise. Exercises daily; does running / walking Current work status. working full time Previously in rehab. no Drug/Alcohol Rehab (Previously). no    Medication History Arthritis Pain Reliever (650MG  Tablet ER, Oral) Active. Levothyroxine Sodium (75MCG Tablet, Oral) Active. Amlodipine Besy-Benazepril HCl ( Oral) Specific dose unknown - Active. Aspirin EC (81MG  Tablet DR, Oral) Active. Benazepril HCl ( Oral) Specific dose unknown - Active. Calcium "900" w/D ( Oral) Specific dose unknown - Active. Fish Oil Specific dose unknown - Active. Glucosamine Chondroitin Complx ( Oral) Specific dose unknown - Active. Klor-Con 10 (10MEQ Tablet ER, Oral) Active. Melatonin CR ( Oral) Specific dose unknown - Active. Pravastatin Sodium ( Oral) Specific dose unknown - Active. PriLOSEC ( Oral) Specific dose unknown - Active. Triamterene/HCTZ ( Oral) Specific dose unknown - Active. Triamterene-HCTZ (37.5-25MG  Tablet, Oral) Active. (bid) AmLODIPine Besylate (5MG  Tablet, Oral) Active. (qd) Benazepril HCl (20MG  Tablet, Oral) Active. (bid) Potassium Chloride Crys ER (10MEQ Tablet ER, Oral) Active. (qd) Glucosamine Sulfate (1000MG  Capsule, Oral) Active. (qd) Calciferol (8000UNIT/ML Solution, Oral) Active. (qd) Omeprazole (20MG  Tablet DR, Oral) Active. (qd) Aspirin Childrens (81MG  Tablet Chewable, Oral) Active. (qd) Melatonin (3MG  Capsule, Oral) Active. (qhs) Fish Oil Maximum Strength (1200MG  Capsule, Oral) Active. (qd) Clarinex-D 12 Hour (2.5-120MG  Tablet ER 12HR, Oral) Active. (qhs) Medications Reconciled.    Pregnancy / Birth History Pregnant. no    Past Surgical History Hysterectomy. complete (non-cancerous) Spinal Decompression. lower back Foot Surgery. left Arthroscopy of Knee.  bilateral Hysterectomy (not due to cancer) - Complete Back Surgery. Fusion L4-5 Right wrist CTR Bladder tack Knee Replacement, Total.  RIGHT Total Knee Replacement. bilateral Spinal Surgery Carpal Tunnel Repair. right Dilation and Curettage of Uterus Spinal Fusion. lower back Tonsillectomy    Other Problems Gastroesophageal Reflux Disease High blood pressure Osteoarthritis Hypothyroidism Oophorectomy. bilateral Hypercholesterolemia Peripheral Vascular Disease Unspecified Diagnosis Tear, medial meniscus, knee, current (836.0). 02/11/2001 Osteoarthrosis NOS, lower leg (715.96). 05/14/2001 Osteoarthrosis, local, scnd, lower leg (715.26). 11/22/2001 Pain in joint, lower leg (719.46). 04/16/2003 Enthesopathy, ankle NOS (726.70). 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 06/06/2010    Vitals 03/17/2013 9:13 AM Weight: 167 lb Height: 60.25 in Body Surface Area: 1.79 m Body Mass Index: 32.34 kg/m BP: 115/70 (Sitting, Left Arm, Standard)     Objective Transcription  Very pleasant white female, alert and oriented x3 and in no acute distress. Gait is antalgic. She is still having moderate to marked tenderness over the right hip, greater trochanteric bursa. Less on the left. Negative straight leg raise. She is neurologically intact. Skin is normal and dry. No increase in respiratory effort.    RADIOGRAPHS:  At this point in time, I have expressed some concern about the migrated rod. It can cause some irritation to the facet complex and could produce pain.  Solid fusion.  Superior migration of rod within pedicle screw construct on the right..      Assessments Transcription  1. Status post L4-5 PLIF in 2012 with superior migration of the rod on the right. Patient with hardware migration.  Pre-op CT scan confirms solid fusion.  Patient with complaints of pain secondary to hardware.  Plan on removal of failed pedicle screw  construct.  All risks/benefits discussed.   We have gone over the risks and benefits of surgery, which include infection, bleeding, nerve damage, death, stroke, paralysis, failure to heal, need for further surgery, ongoing or worse pain, loss of fixation, need for further surgery, CSF leak, loss of bowel or bladder control, ongoing or worse pain.

## 2013-05-15 NOTE — Transfer of Care (Signed)
Immediate Anesthesia Transfer of Care Note  Patient: Gabrielle [name]  Procedure(s) Performed: Procedure(s): REMOVAL RIGHT L4-L5 PEDICLE SCREWS AND ROD  (Right)  Patient Location: PACU  Anesthesia Type:General  Level of Consciousness: awake, alert  and oriented  Airway & Oxygen Therapy: Patient Spontanous Breathing and Patient connected to nasal cannula oxygen  Post-op Assessment: Report given to PACU RN, Post -op Vital signs reviewed and stable and Patient moving all extremities X 4  Post vital signs: Reviewed and stable  Complications: No apparent anesthesia complications

## 2013-05-16 NOTE — Op Note (Signed)
Gabrielle [name], Gabrielle [name] NO.:  [id number]  MEDICAL RECORD NO.:  [id number]  LOCATION:  [facility]                         FACILITY:  [facility]  PHYSICIAN:  Dahlia Bailiff, MD    DATE OF BIRTH:  12/22/1945  DATE OF PROCEDURE:  05/15/2013 DATE OF DISCHARGE:  05/15/2013                              OPERATIVE REPORT   PREOPERATIVE DIAGNOSIS:  Symptomatic hardware with loosening.  POSTOPERATIVE DIAGNOSIS:  Symptomatic hardware with loosening.  FIRST ASSISTANT:  Alyson Locket. Velora Heckler.  PROCEDURE:  Removal of right pedicle screw rod construct.  COMPLICATIONS:  None.  CONDITION:  Stable.  HISTORY:  This is a very pleasant woman, who had a previous transforaminal lumbar interbody fusion several years ago.  She did exceptionally well and has done until recently.  She has felt squeaking in the back and she has been having increasing extension related pain. Imaging studies confirmed migration of the rod superiorly towards the L3- 4 facet complex towards the L2-3 facet complex.  As a result of the broad migration of increasing pain and we elected to remove the hardware on the right side.  Preoperative CT scan demonstrated that the pedicle screws were actually well positioned.  There was no loosening.  She had a solid fusion, but there was superior migration of the pedicle of the rod.  As a result, we elected to proceed with surgery.  Because of her age and underlying medical comorbidities, I elected to do this in the hospital setting.  All appropriate risks, benefits, and alternatives were discussed with the patient.  Consent was obtained.  OPERATIVE NOTE:  The patient was brought to the operating room, placed supine on the operating table.  After successful induction of general anesthesia and endotracheal intubation, TEDs, SCDs were applied.  She was turned prone onto a Wilson frame.  All bony prominences were well padded.  The back was prepped with standard fashion.  The  previous right- sided Wiltse incision was marked out.  Time-out was taken to confirm the patient, procedure, symptomatic side, and all other pertinent important data.  I infiltrated the area with 20 mL of Marcaine with epi for postoperative analgesia.  The previous incision was re-incised and sharply dissected down to the deep fascia.  Deep fascia was incised and I bluntly dissected down to the pedicle screw rod construct, I identified the L4 and L5 pedicle screws.  I could easily removed the locking cap.  The rod had completely migrated out of the L5 pedicle head.  This screw was removed.  I then removed the rod and then removed the L4 screw.  There was no significant bleeding.  I irrigated the wound copiously with normal saline and then closed the deep fascia in a layered fashion with #1 Vicryl sutures, and then a layer of 2-0 Vicryl sutures, and 3-0 Monocryl.  Steri-Strips and dry dressing were applied. The patient was then extubated and transferred to PACU without incident.  If the patient is hemodynamically stable and comfortable she can be discharged to home today if only she meets all appropriate criteria. She will follow up with me in the office in about 2 weeks for wound check.  Dahlia Bailiff, MD     DDB/MEDQ  D:  05/15/2013  T:  05/16/2013  Job:  [id number]

## 2013-05-19 ENCOUNTER — Encounter (HOSPITAL_COMMUNITY): Payer: Self-pay | Admitting: Orthopedic Surgery

## 2013-08-13 ENCOUNTER — Observation Stay (HOSPITAL_COMMUNITY): Payer: Medicare Other | Admitting: Certified Registered"

## 2013-08-13 ENCOUNTER — Observation Stay (HOSPITAL_COMMUNITY)
Admission: EM | Admit: 2013-08-13 | Discharge: 2013-08-14 | Disposition: A | Discharge status: Home / Self Care | Payer: Medicare Other | Attending: Surgery | Admitting: Surgery

## 2013-08-13 ENCOUNTER — Emergency Department (HOSPITAL_COMMUNITY): Payer: Medicare Other

## 2013-08-13 ENCOUNTER — Encounter (HOSPITAL_COMMUNITY)
Admission: EM | Disposition: A | Discharge status: Home / Self Care | Payer: Self-pay | Source: Home / Self Care | Attending: Emergency Medicine

## 2013-08-13 ENCOUNTER — Encounter (HOSPITAL_COMMUNITY): Payer: Self-pay | Admitting: Emergency Medicine

## 2013-08-13 ENCOUNTER — Encounter (HOSPITAL_COMMUNITY): Payer: Medicare Other | Admitting: Certified Registered"

## 2013-08-13 DIAGNOSIS — K3580 Unspecified acute appendicitis: Principal | ICD-10-CM | POA: Insufficient documentation

## 2013-08-13 DIAGNOSIS — K219 Gastro-esophageal reflux disease without esophagitis: Secondary | ICD-10-CM | POA: Insufficient documentation

## 2013-08-13 DIAGNOSIS — K37 Unspecified appendicitis: Secondary | ICD-10-CM | POA: Diagnosis not present

## 2013-08-13 DIAGNOSIS — N3289 Other specified disorders of bladder: Secondary | ICD-10-CM | POA: Diagnosis not present

## 2013-08-13 DIAGNOSIS — I10 Essential (primary) hypertension: Secondary | ICD-10-CM | POA: Insufficient documentation

## 2013-08-13 DIAGNOSIS — Z7982 Long term (current) use of aspirin: Secondary | ICD-10-CM | POA: Insufficient documentation

## 2013-08-13 DIAGNOSIS — Z90710 Acquired absence of both cervix and uterus: Secondary | ICD-10-CM | POA: Diagnosis not present

## 2013-08-13 DIAGNOSIS — Z9049 Acquired absence of other specified parts of digestive tract: Secondary | ICD-10-CM

## 2013-08-13 DIAGNOSIS — E039 Hypothyroidism, unspecified: Secondary | ICD-10-CM | POA: Diagnosis not present

## 2013-08-13 DIAGNOSIS — R109 Unspecified abdominal pain: Secondary | ICD-10-CM | POA: Diagnosis not present

## 2013-08-13 DIAGNOSIS — Z79899 Other long term (current) drug therapy: Secondary | ICD-10-CM | POA: Diagnosis not present

## 2013-08-13 DIAGNOSIS — Z96659 Presence of unspecified artificial knee joint: Secondary | ICD-10-CM | POA: Insufficient documentation

## 2013-08-13 DIAGNOSIS — S72009A Fracture of unspecified part of neck of unspecified femur, initial encounter for closed fracture: Secondary | ICD-10-CM | POA: Diagnosis not present

## 2013-08-13 DIAGNOSIS — Z87891 Personal history of nicotine dependence: Secondary | ICD-10-CM | POA: Insufficient documentation

## 2013-08-13 HISTORY — PX: LAPAROSCOPIC APPENDECTOMY: SHX408

## 2013-08-13 LAB — CBC WITH DIFFERENTIAL/PLATELET
Basophils Absolute: 0 10*3/uL (ref 0.0–0.1)
Basophils Relative: 0 % (ref 0–1)
Eosinophils Absolute: 0.1 10*3/uL (ref 0.0–0.7)
Eosinophils Relative: 1 % (ref 0–5)
HCT: 40.2 % (ref 36.0–46.0)
Hemoglobin: 13.7 g/dL (ref 12.0–15.0)
Lymphocytes Relative: 15 % (ref 12–46)
Lymphs Abs: 1.8 10*3/uL (ref 0.7–4.0)
MCH: 30.5 pg (ref 26.0–34.0)
MCHC: 34.1 g/dL (ref 30.0–36.0)
MCV: 89.5 fL (ref 78.0–100.0)
Monocytes Absolute: 0.8 10*3/uL (ref 0.1–1.0)
Monocytes Relative: 7 % (ref 3–12)
Neutro Abs: 9 10*3/uL — ABNORMAL HIGH (ref 1.7–7.7)
Neutrophils Relative %: 77 % (ref 43–77)
Platelets: 229 10*3/uL (ref 150–400)
RBC: 4.49 MIL/uL (ref 3.87–5.11)
RDW: 13.4 % (ref 11.5–15.5)
WBC: 11.6 10*3/uL — ABNORMAL HIGH (ref 4.0–10.5)

## 2013-08-13 LAB — BASIC METABOLIC PANEL
Anion gap: 14 (ref 5–15)
BUN: 24 mg/dL — ABNORMAL HIGH (ref 6–23)
CO2: 26 mEq/L (ref 19–32)
Calcium: 10.1 mg/dL (ref 8.4–10.5)
Chloride: 93 mEq/L — ABNORMAL LOW (ref 96–112)
Creatinine, Ser: 0.98 mg/dL (ref 0.50–1.10)
GFR calc Af Amer: 68 mL/min — ABNORMAL LOW (ref >90)
GFR calc non Af Amer: 58 mL/min — ABNORMAL LOW (ref >90)
Glucose, Bld: 101 mg/dL — ABNORMAL HIGH (ref 70–99)
Potassium: 4.1 mEq/L (ref 3.7–5.3)
Sodium: 133 mEq/L — ABNORMAL LOW (ref 137–147)

## 2013-08-13 SURGERY — APPENDECTOMY, LAPAROSCOPIC
Anesthesia: General | Site: Abdomen

## 2013-08-13 MED ORDER — ONDANSETRON HCL 4 MG/2ML IJ SOLN
4.0000 mg | Freq: Once | INTRAMUSCULAR | Status: AC
  Administered 2013-08-13: 4 mg via INTRAVENOUS
  Filled 2013-08-13: qty 2

## 2013-08-13 MED ORDER — ONDANSETRON HCL 4 MG/2ML IJ SOLN: 4.0000 mg | Freq: Four times a day (QID) | INTRAMUSCULAR | Status: DC | PRN

## 2013-08-13 MED ORDER — LACTATED RINGERS IR SOLN
Status: DC | PRN
  Administered 2013-08-13: 1000 mL

## 2013-08-13 MED ORDER — MIDAZOLAM HCL 5 MG/5ML IJ SOLN
INTRAMUSCULAR | Status: DC | PRN
  Administered 2013-08-13: 2 mg via INTRAVENOUS

## 2013-08-13 MED ORDER — ONDANSETRON HCL 4 MG/2ML IJ SOLN
INTRAMUSCULAR | Status: AC
  Filled 2013-08-13: qty 2

## 2013-08-13 MED ORDER — CIPROFLOXACIN IN D5W 400 MG/200ML IV SOLN
400.0000 mg | Freq: Two times a day (BID) | INTRAVENOUS | Status: DC
  Administered 2013-08-13: 400 mg via INTRAVENOUS
  Filled 2013-08-13 (×2): qty 200

## 2013-08-13 MED ORDER — LIDOCAINE HCL (CARDIAC) 20 MG/ML IV SOLN
INTRAVENOUS | Status: DC | PRN
  Administered 2013-08-13: 50 mg via INTRAVENOUS

## 2013-08-13 MED ORDER — HEPARIN SODIUM (PORCINE) 5000 UNIT/ML IJ SOLN
5000.0000 [IU] | Freq: Three times a day (TID) | INTRAMUSCULAR | Status: DC
  Administered 2013-08-13 – 2013-08-14 (×2): 5000 [IU] via SUBCUTANEOUS
  Filled 2013-08-13 (×5): qty 1

## 2013-08-13 MED ORDER — IOHEXOL 300 MG/ML  SOLN
50.0000 mL | Freq: Once | INTRAMUSCULAR | Status: AC | PRN
  Administered 2013-08-13: 50 mL via ORAL

## 2013-08-13 MED ORDER — LACTATED RINGERS IV SOLN
INTRAVENOUS | Status: DC | PRN
  Administered 2013-08-13: 18:00:00 via INTRAVENOUS

## 2013-08-13 MED ORDER — BUPIVACAINE HCL (PF) 0.5 % IJ SOLN
INTRAMUSCULAR | Status: AC
  Filled 2013-08-13: qty 30

## 2013-08-13 MED ORDER — FENTANYL CITRATE 0.05 MG/ML IJ SOLN
INTRAMUSCULAR | Status: AC
Start: 2013-08-13 — End: 2013-08-13
  Filled 2013-08-13: qty 5

## 2013-08-13 MED ORDER — ACETAMINOPHEN 325 MG PO TABS: 650.0000 mg | ORAL_TABLET | ORAL | Status: DC | PRN

## 2013-08-13 MED ORDER — NEOSTIGMINE METHYLSULFATE 10 MG/10ML IV SOLN
INTRAVENOUS | Status: DC | PRN
  Administered 2013-08-13: 4 mg via INTRAVENOUS

## 2013-08-13 MED ORDER — SODIUM CHLORIDE 0.9 % IV BOLUS (SEPSIS)
1000.0000 mL | Freq: Once | INTRAVENOUS | Status: AC
  Administered 2013-08-13: 1000 mL via INTRAVENOUS

## 2013-08-13 MED ORDER — ONDANSETRON HCL 4 MG/2ML IJ SOLN
INTRAMUSCULAR | Status: DC | PRN
  Administered 2013-08-13: 4 mg via INTRAVENOUS

## 2013-08-13 MED ORDER — HYDROMORPHONE HCL PF 1 MG/ML IJ SOLN: 0.2500 mg | INTRAMUSCULAR | Status: DC | PRN

## 2013-08-13 MED ORDER — MORPHINE SULFATE 4 MG/ML IJ SOLN
4.0000 mg | Freq: Once | INTRAMUSCULAR | Status: AC
  Administered 2013-08-13: 4 mg via INTRAVENOUS
  Filled 2013-08-13: qty 1

## 2013-08-13 MED ORDER — MORPHINE SULFATE 2 MG/ML IJ SOLN
1.0000 mg | INTRAMUSCULAR | Status: DC | PRN
  Administered 2013-08-13: 1 mg via INTRAVENOUS
  Filled 2013-08-13: qty 1

## 2013-08-13 MED ORDER — SUCCINYLCHOLINE CHLORIDE 20 MG/ML IJ SOLN
INTRAMUSCULAR | Status: DC | PRN
  Administered 2013-08-13: 100 mg via INTRAVENOUS

## 2013-08-13 MED ORDER — PROMETHAZINE HCL 25 MG/ML IJ SOLN: 6.2500 mg | INTRAMUSCULAR | Status: DC | PRN

## 2013-08-13 MED ORDER — 0.9 % SODIUM CHLORIDE (POUR BTL) OPTIME
TOPICAL | Status: DC | PRN
  Administered 2013-08-13: 1000 mL

## 2013-08-13 MED ORDER — DEXAMETHASONE SODIUM PHOSPHATE 10 MG/ML IJ SOLN
INTRAMUSCULAR | Status: DC | PRN
  Administered 2013-08-13: 10 mg via INTRAVENOUS

## 2013-08-13 MED ORDER — CIPROFLOXACIN IN D5W 400 MG/200ML IV SOLN
400.0000 mg | Freq: Two times a day (BID) | INTRAVENOUS | Status: AC
  Administered 2013-08-14: 400 mg via INTRAVENOUS
  Filled 2013-08-13: qty 200

## 2013-08-13 MED ORDER — LACTATED RINGERS IV SOLN: INTRAVENOUS | Status: DC

## 2013-08-13 MED ORDER — PROPOFOL 10 MG/ML IV BOLUS
INTRAVENOUS | Status: AC
  Filled 2013-08-13: qty 20

## 2013-08-13 MED ORDER — KCL IN DEXTROSE-NACL 20-5-0.45 MEQ/L-%-% IV SOLN
INTRAVENOUS | Status: DC
  Administered 2013-08-14: 02:00:00 via INTRAVENOUS
  Filled 2013-08-13 (×2): qty 1000

## 2013-08-13 MED ORDER — POTASSIUM CHLORIDE IN NACL 20-0.9 MEQ/L-% IV SOLN
INTRAVENOUS | Status: DC
  Filled 2013-08-13 (×2): qty 1000

## 2013-08-13 MED ORDER — ROCURONIUM BROMIDE 100 MG/10ML IV SOLN
INTRAVENOUS | Status: AC
  Filled 2013-08-13: qty 1

## 2013-08-13 MED ORDER — GLYCOPYRROLATE 0.2 MG/ML IJ SOLN
INTRAMUSCULAR | Status: DC | PRN
  Administered 2013-08-13: 0.6 mg via INTRAVENOUS

## 2013-08-13 MED ORDER — MORPHINE SULFATE 2 MG/ML IJ SOLN: 1.0000 mg | INTRAMUSCULAR | Status: DC | PRN

## 2013-08-13 MED ORDER — OXYCODONE-ACETAMINOPHEN 5-325 MG PO TABS
1.0000 | ORAL_TABLET | ORAL | Status: DC | PRN
  Administered 2013-08-14 (×2): 1 via ORAL
  Filled 2013-08-13 (×2): qty 1

## 2013-08-13 MED ORDER — DEXAMETHASONE SODIUM PHOSPHATE 10 MG/ML IJ SOLN
INTRAMUSCULAR | Status: AC
  Filled 2013-08-13: qty 1

## 2013-08-13 MED ORDER — ROCURONIUM BROMIDE 100 MG/10ML IV SOLN
INTRAVENOUS | Status: DC | PRN
  Administered 2013-08-13: 30 mg via INTRAVENOUS

## 2013-08-13 MED ORDER — LIDOCAINE HCL (CARDIAC) 20 MG/ML IV SOLN
INTRAVENOUS | Status: AC
  Filled 2013-08-13: qty 5

## 2013-08-13 MED ORDER — FENTANYL CITRATE 0.05 MG/ML IJ SOLN
INTRAMUSCULAR | Status: DC | PRN
  Administered 2013-08-13: 50 ug via INTRAVENOUS
  Administered 2013-08-13: 100 ug via INTRAVENOUS
  Administered 2013-08-13 (×2): 50 ug via INTRAVENOUS

## 2013-08-13 MED ORDER — DIPHENHYDRAMINE HCL 50 MG/ML IJ SOLN: 12.5000 mg | Freq: Four times a day (QID) | INTRAMUSCULAR | Status: DC | PRN

## 2013-08-13 MED ORDER — IOHEXOL 300 MG/ML  SOLN
100.0000 mL | Freq: Once | INTRAMUSCULAR | Status: AC | PRN
  Administered 2013-08-13: 100 mL via INTRAVENOUS

## 2013-08-13 MED ORDER — MIDAZOLAM HCL 2 MG/2ML IJ SOLN
INTRAMUSCULAR | Status: AC
  Filled 2013-08-13: qty 2

## 2013-08-13 MED ORDER — BUPIVACAINE HCL 0.5 % IJ SOLN
INTRAMUSCULAR | Status: DC | PRN
  Administered 2013-08-13: 10 mL

## 2013-08-13 MED ORDER — PROPOFOL 10 MG/ML IV BOLUS
INTRAVENOUS | Status: DC | PRN
  Administered 2013-08-13: 180 mg via INTRAVENOUS

## 2013-08-13 MED ORDER — DIPHENHYDRAMINE HCL 12.5 MG/5ML PO ELIX: 12.5000 mg | ORAL_SOLUTION | Freq: Four times a day (QID) | ORAL | Status: DC | PRN

## 2013-08-13 SURGICAL SUPPLY — 42 items
APPLIER CLIP ROT 10 11.4 M/L (STAPLE)
CABLE HIGH FREQUENCY MONO STRZ (ELECTRODE) IMPLANT
CLIP APPLIE ROT 10 11.4 M/L (STAPLE) IMPLANT
CLOSURE WOUND 1/2 X4 (GAUZE/BANDAGES/DRESSINGS)
CUTTER FLEX LINEAR 45M (STAPLE) ×3 IMPLANT
DECANTER SPIKE VIAL GLASS SM (MISCELLANEOUS) IMPLANT
DERMABOND ADVANCED (GAUZE/BANDAGES/DRESSINGS) ×2
DERMABOND ADVANCED .7 DNX12 (GAUZE/BANDAGES/DRESSINGS) ×1 IMPLANT
DRAPE LAPAROSCOPIC ABDOMINAL (DRAPES) ×3 IMPLANT
ELECT REM PT RETURN 9FT ADLT (ELECTROSURGICAL) ×3
ELECTRODE REM PT RTRN 9FT ADLT (ELECTROSURGICAL) ×1 IMPLANT
ENDOLOOP SUT PDS II  0 18 (SUTURE)
ENDOLOOP SUT PDS II 0 18 (SUTURE) IMPLANT
GLOVE BIOGEL M 8.0 STRL (GLOVE) ×3 IMPLANT
GLOVE BIOGEL PI IND STRL 7.0 (GLOVE) ×1 IMPLANT
GLOVE BIOGEL PI INDICATOR 7.0 (GLOVE) ×2
GOWN SPEC L4 XLG W/TWL (GOWN DISPOSABLE) ×3 IMPLANT
GOWN STRL REUS W/TWL XL LVL3 (GOWN DISPOSABLE) ×6 IMPLANT
KIT BASIN OR (CUSTOM PROCEDURE TRAY) ×3 IMPLANT
MANIFOLD NEPTUNE II (INSTRUMENTS) ×3 IMPLANT
POUCH RETRIEVAL ECOSAC 10 (ENDOMECHANICALS) ×1 IMPLANT
POUCH RETRIEVAL ECOSAC 10MM (ENDOMECHANICALS) ×2
POUCH SPECIMEN RETRIEVAL 10MM (ENDOMECHANICALS) IMPLANT
RELOAD 45 VASCULAR/THIN (ENDOMECHANICALS) ×3 IMPLANT
RELOAD STAPLE TA45 3.5 REG BLU (ENDOMECHANICALS) IMPLANT
SCISSORS LAP 5X35 DISP (ENDOMECHANICALS) ×3 IMPLANT
SCISSORS LAP 5X45 EPIX DISP (ENDOMECHANICALS) IMPLANT
SET IRRIG TUBING LAPAROSCOPIC (IRRIGATION / IRRIGATOR) ×3 IMPLANT
SHEARS CURVED HARMONIC AC 45CM (MISCELLANEOUS) IMPLANT
SLEEVE XCEL OPT CAN 5 100 (ENDOMECHANICALS) ×3 IMPLANT
SOLUTION ANTI FOG 6CC (MISCELLANEOUS) ×3 IMPLANT
STRIP CLOSURE SKIN 1/2X4 (GAUZE/BANDAGES/DRESSINGS) IMPLANT
SUT VIC AB 4-0 SH 18 (SUTURE) ×3 IMPLANT
SUT VICRYL 0 UR6 27IN ABS (SUTURE) ×6 IMPLANT
TOWEL OR 17X26 10 PK STRL BLUE (TOWEL DISPOSABLE) ×3 IMPLANT
TOWEL OR NON WOVEN STRL DISP B (DISPOSABLE) ×3 IMPLANT
TRAY FOLEY CATH 14FRSI W/METER (CATHETERS) ×3 IMPLANT
TRAY LAP CHOLE (CUSTOM PROCEDURE TRAY) ×3 IMPLANT
TROCAR BLADELESS OPT 5 100 (ENDOMECHANICALS) ×3 IMPLANT
TROCAR XCEL BLUNT TIP 100MML (ENDOMECHANICALS) ×3 IMPLANT
TROCAR XCEL NON-BLD 11X100MML (ENDOMECHANICALS) IMPLANT
TUBING INSUFFLATION 10FT LAP (TUBING) ×3 IMPLANT

## 2013-08-13 NOTE — ED Notes (Signed)
Pt requesting pain meds. PA aware.

## 2013-08-13 NOTE — Op Note (Signed)
Surgeon: Kaylyn Lim, MD, FACS  Asst:  none  Anes:  general  Procedure: Laparoscopic appendectomy  Diagnosis: Acute appendicitis  Complications: none  EBL:   minimal cc  Drains: none  Description of Procedure:  The patient was taken to OR 1 at [facility].  After anesthesia was administered and the patient was prepped a timeout was performed.  Access achieved with Hasson through an umbilical hernia.  Two five mm trocars were place in the right upper quadrant and left lower quadrant.  The appendix was engorged and turgid and the base was identified.  The mesentery was dissected at the base and the appendix amputated from the cecum with the 4.5 mm stapler with white cartridge.  The mesentery of the appendix was divided with harmonic scalpel.  The appendix was placed in a bag and brought out through the umbilicus.  The stump was examined and no bleeding was noted.  The umbilicus was closed with two sutures of zero vicryl.  The abdomen was deflated and the incisions closed with 4-0 vicryl and Dermabond.    The patient tolerated the procedure well and was taken to the PACU in stable condition.     Matt B. Hassell Done, [place], [facility], [place]

## 2013-08-13 NOTE — ED Notes (Signed)
OR tech here to take pt to OR 

## 2013-08-13 NOTE — Anesthesia Preprocedure Evaluation (Signed)
Anesthesia Evaluation  Patient identified by MRN, date of birth, ID band Patient awake    Reviewed: Allergy & Precautions, H&P , NPO status , Patient's Chart, lab work & pertinent test results, reviewed documented beta blocker date and time   Airway Mallampati: II TM Distance: >3 FB Neck ROM: Full    Dental  (+) Teeth Intact   Pulmonary neg pulmonary ROS, former smoker,  breath sounds clear to auscultation        Cardiovascular hypertension, Pt. on medications Rhythm:Regular Rate:Normal  Denies cardiac symptoms   Neuro/Psych negative neurological ROS  negative psych ROS   GI/Hepatic negative GI ROS, Neg liver ROS,   Endo/Other  Hypothyroidism On replacement  Renal/GU negative Renal ROS  negative genitourinary   Musculoskeletal negative musculoskeletal ROS (+)   Abdominal   Peds negative pediatric ROS (+)  Hematology negative hematology ROS (+)   Anesthesia Other Findings   Reproductive/Obstetrics negative OB ROS                           Anesthesia Physical Anesthesia Plan  ASA: II and emergent  Anesthesia Plan: General   Post-op Pain Management:    Induction: Intravenous, Rapid sequence and Cricoid pressure planned  Airway Management Planned: Oral ETT  Additional Equipment:   Intra-op Plan:   Post-operative Plan: Extubation in OR  Informed Consent: I have reviewed the patients History and Physical, chart, labs and discussed the procedure including the risks, benefits and alternatives for the proposed anesthesia with the patient or authorized representative who has indicated his/her understanding and acceptance.   Dental advisory given  Plan Discussed with: CRNA  Anesthesia Plan Comments:         Anesthesia Quick Evaluation

## 2013-08-13 NOTE — H&P (Signed)
[facility] Admission Note  CHARL WELLEN 1945/11/21  [id number].    Requesting MD: Dr. Zenia Resides Chief Complaint/Reason for Consult: appendicitis  HPI:  68 year old female with a PMH of hypertension, GERD, and arthritis who presents with abdominal pain for the past 2 days. The pain started in her upper abdomen and moved to her RLQ early this am (08/13/13). The pain is now located in her RLQ and does not radiate. The pain is described as aching and severe. The pain started gradually and progressively worsened since the onset (08/11/13). No alleviating factors.  She tried OTC antacids/antiemetics/gas relief without relief. Any movement or palpation makes the pain worse.  Associated symptoms include distension.  Patient was sent here from her PCP for appendicitis rule out.  Patient denies fever, headache, NVD, chest pain, SOB, dysuria, abnormal vaginal bleeding/discharge. No BM since Sunday and no flatus today.  Never had this pain before.  PSH include hysterectomy and bladder sling.   ROS: All systems reviewed and otherwise negative except for as above  History reviewed. No pertinent family history.  Past Medical History  Diagnosis Date  . Arthritis 01-17-11    degenerative disc disease-all joints  . Hypertension 01-17-11    tx. meds  . Cholesterol serum elevated 01-17-11    tx. meds  . GERD (gastroesophageal reflux disease) 01-17-11    tx. omeprazole  . Sinus problem 01-17-11    tx. Claritin D  . Heart murmur     slight murmur  . Seasonal allergies     Past Surgical History  Procedure Laterality Date  . Abdominal hysterectomy  01-17-11    '93-Hysterectomy-heavy bleeding  . Carpal tunnel release  01-17-11    '02-left, also right  . Back surgery  01-17-11    04-20-10-T-Lift lumbar fusion  . Tonsillectomy  01-17-11    child  . Burch procedure  01-17-11    Bladder sling  . 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 Colonoscopy    . Hardware removal Right 05/15/2013    Procedure: REMOVAL RIGHT L4-L5 PEDICLE SCREWS AND ROD ;  Surgeon: Melina Schools, MD;  Location: [place];  Service: Orthopedics;  Laterality: Right;  . Joint replacement  01-17-11     RTKA' 02    Social History:  reports that she quit smoking about 30 years ago. Her smoking use included Cigarettes. She has a 19 pack-year smoking history. She has never used smokeless tobacco. She reports that she drinks about 1.2 ounces of alcohol per week. She reports that she does not use illicit drugs.  Allergies:  Allergies  Allergen Reactions  . Penicillins Other (See Comments)    Had when was a child   . Pravastatin Other (See Comments)    Messes with Liver      (Not in a hospital admission)  Blood pressure 129/64, pulse 78, temperature 97.8 F (36.6 C), temperature source Oral, resp. rate 18, SpO2 94.00%. Physical Exam: General: pleasant, WD/WN white female who is laying in bed in NAD HEENT: head is normocephalic, atraumatic.  Sclera are noninjected.  PERRL.  Ears and nose without any masses or lesions.  Mouth is pink and moist Heart: regular, rate, and rhythm.  No obvious murmurs, gallops, or rubs noted.  Palpable pedal pulses bilaterally Lungs: CTAB, no wheezes, rhonchi, or rales noted.  Respiratory effort nonlabored Abd: soft, tender in RLQ, +  rebound, +rosvings sign, +BS, no masses, hernias, or organomegaly, lower abdominal scar well healed MS: all 4 extremities are symmetrical with no cyanosis, clubbing, or edema. Skin: warm and dry with no masses, lesions, or rashes Psych: A&Ox3 with an appropriate affect.   Results for orders placed during the hospital encounter of 08/13/13 (from the past 48 hour(s))  CBC WITH DIFFERENTIAL     Status: Abnormal   Collection Time    08/13/13 12:41 PM      Result Value Ref Range   WBC 11.6 (*) 4.0 - 10.5 K/uL   RBC 4.49  3.87 -  5.11 MIL/uL   Hemoglobin 13.7  12.0 - 15.0 g/dL   HCT 40.2  36.0 - 46.0 %   MCV 89.5  78.0 - 100.0 fL   MCH 30.5  26.0 - 34.0 pg   MCHC 34.1  30.0 - 36.0 g/dL   RDW 13.4  11.5 - 15.5 %   Platelets 229  150 - 400 K/uL   Neutrophils Relative % 77  43 - 77 %   Neutro Abs 9.0 (*) 1.7 - 7.7 K/uL   Lymphocytes Relative 15  12 - 46 %   Lymphs Abs 1.8  0.7 - 4.0 K/uL   Monocytes Relative 7  3 - 12 %   Monocytes Absolute 0.8  0.1 - 1.0 K/uL   Eosinophils Relative 1  0 - 5 %   Eosinophils Absolute 0.1  0.0 - 0.7 K/uL   Basophils Relative 0  0 - 1 %   Basophils Absolute 0.0  0.0 - 0.1 K/uL  BASIC METABOLIC PANEL     Status: Abnormal   Collection Time    08/13/13 12:41 PM      Result Value Ref Range   Sodium 133 (*) 137 - 147 mEq/L   Potassium 4.1  3.7 - 5.3 mEq/L   Chloride 93 (*) 96 - 112 mEq/L   CO2 26  19 - 32 mEq/L   Glucose, Bld 101 (*) 70 - 99 mg/dL   BUN 24 (*) 6 - 23 mg/dL   Creatinine, Ser 0.98  0.50 - 1.10 mg/dL   Calcium 10.1  8.4 - 10.5 mg/dL   GFR calc non Af Amer 58 (*) >90 mL/min   GFR calc Af Amer 68 (*) >90 mL/min   Comment: (NOTE)     The eGFR has been calculated using the CKD EPI equation.     This calculation has not been validated in all clinical situations.     eGFR's persistently <90 mL/min signify possible Chronic Kidney     Disease.   Anion gap 14  5 - 15   Ct Abdomen Pelvis W Contrast  08/13/2013   CLINICAL DATA:  Right lower quadrant abdominal pain  EXAM: CT ABDOMEN AND PELVIS WITH CONTRAST  TECHNIQUE: Multidetector CT imaging of the abdomen and pelvis was performed using the standard protocol following bolus administration of intravenous contrast.  CONTRAST:  148m OMNIPAQUE IOHEXOL 300 MG/ML  SOLN  COMPARISON:  None.  FINDINGS: Lung bases are essentially clear.  Liver, spleen, pancreas, and adrenal glands are within normal limits.  Gallbladder is unremarkable. No intrahepatic or extrahepatic ductal dilatation.  Left renal scarring/atrophy. Right kidney is  grossly unremarkable. No hydronephrosis.  No evidence of bowel obstruction. Dilated/abnormal appendix, measuring up to 11 mm (series 2/ image 49), with suspected mild periappendiceal inflammatory changes (series 2/image 55). Mobile cecum in the anterior left mid abdomen.  No drainable fluid collection/ abscess.  No free air.  Atherosclerotic calcifications of the abdominal aorta and branch vessels.  No abdominopelvic ascites.  Small retroperitoneal lymph nodes which do not meet pathologic CT size criteria.  Status post hysterectomy.  No adnexal masses.  Moderately distended bladder.  Degenerative changes of the visualized thoracolumbar spine. Status post PLIF at L4-5.  IMPRESSION: Suspected early acute appendicitis.  No drainable fluid collections is abscess.  No free air.  These results were called by telephone at the time of interpretation on 08/13/2013 at 3:01 pm to Dr. Alvina Chou , who verbally acknowledged these results.   Electronically Signed   By: Julian Hy M.D.   On: 08/13/2013 15:02      Assessment/Plan Acute appendicitis Leukocytosis  Plan: 1.  Admit to [facility], OR urgently for lap appy 2.  NPO,  IVF, pain control, antiemetics, antibiotics (cipro) 3.  On call surgeon to see and evaluate before OR 4.  Discussed risks/benefits of surgery and the patient would like to proceed.   Coralie Keens, [facility] Surgery 08/13/2013, 4:30 PM Pager: [phone]

## 2013-08-13 NOTE — ED Notes (Signed)
MD at bedside. Surgeon at bedside.  

## 2013-08-13 NOTE — Transfer of Care (Signed)
Immediate Anesthesia Transfer of Care Note  Patient: Gabrielle [name]  Procedure(s) Performed: Procedure(s): APPENDECTOMY LAPAROSCOPIC (N/A)  Patient Location: PACU  Anesthesia Type:General  Level of Consciousness: awake, alert  and oriented  Airway & Oxygen Therapy: Patient Spontanous Breathing and Patient connected to face mask oxygen  Post-op Assessment: Report given to PACU RN and Post -op Vital signs reviewed and stable  Post vital signs: Reviewed and stable  Complications: No apparent anesthesia complications

## 2013-08-13 NOTE — ED Notes (Signed)
Pt states woke up Monday night w/ RLQ abdominal pain, went to PCP this morning and was sent here to r/o appendix, denies n/v/d.

## 2013-08-13 NOTE — ED Notes (Signed)
MD at bedside. Dr. Excell Seltzer at bedside.

## 2013-08-13 NOTE — Anesthesia Postprocedure Evaluation (Signed)
Anesthesia Post Note  Patient: Gabrielle [name]  Procedure(s) Performed: Procedure(s) (LRB): APPENDECTOMY LAPAROSCOPIC (N/A)  Anesthesia type: General  Patient location: PACU  Post pain: Pain level controlled  Post assessment: Post-op Vital signs reviewed  Last Vitals:  Filed Vitals:   08/13/13 2144  BP: 119/62  Pulse: 75  Temp: 36.7 C  Resp: 18    Post vital signs: Reviewed  Level of consciousness: sedated  Complications: No apparent anesthesia complications

## 2013-08-13 NOTE — ED Provider Notes (Signed)
CSN: [id number]     Arrival date & time 08/13/13  1105 History   First MD Initiated Contact with Patient 08/13/13 1223     Chief Complaint  Patient presents with  . Abdominal Pain    right     (Consider location/radiation/quality/duration/timing/severity/associated sxs/prior Treatment) HPI Comments: Patient is a 68 year old female with a past medical history of hypertension, GERD, and arthritis who presents with abdominal pain for the past 2 das. The pain started in her epigastric area and moved to her RLQ. The pain is now located in her RLQ and does not radiate. The pain is described as aching and severe. The pain started gradually and progressively worsened since the onset. No alleviating/aggravating factors. The patient has tried gas relief medication for symptoms without relief. Associated symptoms include nothing. Patient was sent here from her PCP for appendicitis rule out.  Patient denies fever, headache, NVD, chest pain, SOB, dysuria, constipation, abnormal vaginal bleeding/discharge.     Patient is a 68 y.o. female presenting with abdominal pain.  Abdominal Pain Associated symptoms: no chest pain, no chills, no diarrhea, no dysuria, no fatigue, no fever, no nausea, no shortness of breath and no vomiting     Past Medical History  Diagnosis Date  . Arthritis 01-17-11    degenerative disc disease-all joints  . Hypertension 01-17-11    tx. meds  . Cholesterol serum elevated 01-17-11    tx. meds  . GERD (gastroesophageal reflux disease) 01-17-11    tx. omeprazole  . Sinus problem 01-17-11    tx. Claritin D  . Heart murmur     slight murmur  . Seasonal allergies    Past Surgical History  Procedure Laterality Date  . Abdominal hysterectomy  01-17-11    '93-Hysterectomy-heavy bleeding  . Carpal tunnel release  01-17-11    '02-left  . Joint replacement  01-17-11     RTKA' 02  . Back surgery  01-17-11    04-20-10-T-Lift lumbar fusion  . Tonsillectomy  01-17-11    child   . Burch procedure  01-17-11    Bladder sling  . 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 Colonoscopy    . Hardware removal Right 05/15/2013    Procedure: REMOVAL RIGHT L4-L5 PEDICLE SCREWS AND ROD ;  Surgeon: Melina Schools, MD;  Location: [place];  Service: Orthopedics;  Laterality: Right;   No family history on file. History  Substance Use Topics  . Smoking status: Former Smoker -- 1.00 packs/day for 19 years    Types: Cigarettes  . Smokeless tobacco: Never Used  . Alcohol Use: 1.2 oz/week    2 Glasses of wine per week     Comment: per day   OB History   Grav Para Term Preterm Abortions TAB SAB Ect Mult Living                 Review of Systems  Constitutional: Negative for fever, chills and fatigue.  HENT: Negative for trouble swallowing.   Eyes: Negative for visual disturbance.  Respiratory: Negative for shortness of breath.   Cardiovascular: Negative for chest pain and palpitations.  Gastrointestinal: Positive for abdominal pain. Negative for nausea, vomiting and diarrhea.  Genitourinary: Negative for dysuria and difficulty urinating.  Musculoskeletal: Negative for arthralgias and neck pain.  Skin: Negative for color change.  Neurological: Negative for dizziness and weakness.  Psychiatric/Behavioral: Negative  for dysphoric mood.      Allergies  Penicillins and Pravastatin  Home Medications   Prior to Admission medications   Medication Sig Start Date End Date Taking? Authorizing Provider  amLODipine (NORVASC) 5 MG tablet Take 5 mg by mouth every morning.    Yes Historical Provider, MD  aspirin EC 81 MG tablet Take 81 mg by mouth at bedtime.    Yes Historical Provider, MD  benazepril (LOTENSIN) 20 MG tablet Take 20 mg by mouth 2 (two) times daily.    Yes Historical Provider, MD  BIOTIN PO Take 1 tablet by mouth 2 (two) times daily.   Yes  Historical Provider, MD  Calcium Carbonate-Vitamin D (CALCIUM 600 + D PO) Take 1 tablet by mouth 2 (two) times daily.     Yes Historical Provider, MD  docusate sodium (COLACE) 100 MG capsule Take 100 mg by mouth every morning.    Yes Historical Provider, MD  fish oil-omega-3 fatty acids 1000 MG capsule Take 1 g by mouth at bedtime.    Yes Historical Provider, MD  glucosamine-chondroitin 500-400 MG tablet Take 2 tablets by mouth daily with breakfast.   Yes Historical Provider, MD  levothyroxine (SYNTHROID, LEVOTHROID) 75 MCG tablet Take 75 mcg by mouth every morning.    Yes Historical Provider, MD  loratadine-pseudoephedrine (CLARITIN-D 24-HOUR) 10-240 MG per 24 hr tablet Take 1 tablet by mouth at bedtime.    Yes Historical Provider, MD  magnesium citrate SOLN Take 0.5 Bottles by mouth once.   Yes Historical Provider, MD  Melatonin 3 MG TABS Take 1 tablet by mouth at bedtime.     Yes Historical Provider, MD  omeprazole (PRILOSEC) 20 MG capsule Take 20 mg by mouth daily with breakfast.    Yes Historical Provider, MD  polyethylene glycol (MIRALAX / GLYCOLAX) packet Take 17 g by mouth daily as needed (for constipation).    Yes Historical Provider, MD  potassium chloride (K-DUR,KLOR-CON) 10 MEQ tablet Take 10 mEq by mouth daily.  11/02/12  Yes Historical Provider, MD  triamcinolone acetonide (KENALOG) 40 MG/ML injection Inject 40 mg into the muscle once.   Yes Historical Provider, MD  triamterene-hydrochlorothiazide (DYAZIDE) 37.5-25 MG per capsule Take 1 capsule by mouth every morning.    Yes Historical Provider, MD   BP 139/71  Pulse 96  Temp(Src) 98.5 F (36.9 C) (Oral)  Resp 20  SpO2 99% Physical Exam  Nursing note and vitals reviewed. Constitutional: She is oriented to person, place, and time. She appears well-developed and well-nourished. No distress.  HENT:  Head: Normocephalic and atraumatic.  Eyes: Conjunctivae are normal.  Neck: Normal range of motion.  Cardiovascular: Normal rate and  regular rhythm.  Exam reveals no gallop and no friction rub.   No murmur heard. Pulmonary/Chest: Effort normal and breath sounds normal. She has no wheezes. She has no rales. She exhibits no tenderness.  Abdominal: Soft. She exhibits no distension. There is tenderness. There is no rebound and no guarding.  RLQ tenderness to palpation at McBurney's point. No other focal tenderness or peritoneal signs.   Musculoskeletal: Normal range of motion.  Neurological: She is alert and oriented to person, place, and time. Coordination normal.  Speech is goal-oriented. Moves limbs without ataxia.   Skin: Skin is warm and dry.  Psychiatric: She has a normal mood and affect. Her behavior is normal.    ED Course  Procedures (including critical care time)   Date: 08/13/2013  Rate: 88  Rhythm: normal sinus rhythm  QRS Axis:  left  Intervals: QT prolonged  ST/T Wave abnormalities: indeterminate  Conduction Disutrbances:none  Narrative Interpretation: NSR without acute changes  Old EKG Reviewed: unchanged    Labs Review Labs Reviewed  CBC WITH DIFFERENTIAL - Abnormal; Notable for the following:    WBC 11.6 (*)    Neutro Abs 9.0 (*)    All other components within normal limits  BASIC METABOLIC PANEL - Abnormal; Notable for the following:    Sodium 133 (*)    Chloride 93 (*)    Glucose, Bld 101 (*)    BUN 24 (*)    GFR calc non Af Amer 58 (*)    GFR calc Af Amer 68 (*)    All other components within normal limits    Imaging Review Ct Abdomen Pelvis W Contrast  08/13/2013   CLINICAL DATA:  Right lower quadrant abdominal pain  EXAM: CT ABDOMEN AND PELVIS WITH CONTRAST  TECHNIQUE: Multidetector CT imaging of the abdomen and pelvis was performed using the standard protocol following bolus administration of intravenous contrast.  CONTRAST:  153mL OMNIPAQUE IOHEXOL 300 MG/ML  SOLN  COMPARISON:  None.  FINDINGS: Lung bases are essentially clear.  Liver, spleen, pancreas, and adrenal glands are within  normal limits.  Gallbladder is unremarkable. No intrahepatic or extrahepatic ductal dilatation.  Left renal scarring/atrophy. Right kidney is grossly unremarkable. No hydronephrosis.  No evidence of bowel obstruction. Dilated/abnormal appendix, measuring up to 11 mm (series 2/ image 49), with suspected mild periappendiceal inflammatory changes (series 2/image 55). Mobile cecum in the anterior left mid abdomen.  No drainable fluid collection/ abscess.  No free air.  Atherosclerotic calcifications of the abdominal aorta and branch vessels.  No abdominopelvic ascites.  Small retroperitoneal lymph nodes which do not meet pathologic CT size criteria.  Status post hysterectomy.  No adnexal masses.  Moderately distended bladder.  Degenerative changes of the visualized thoracolumbar spine. Status post PLIF at L4-5.  IMPRESSION: Suspected early acute appendicitis.  No drainable fluid collections is abscess.  No free air.  These results were called by telephone at the time of interpretation on 08/13/2013 at 3:01 pm to Dr. Alvina Chou , who verbally acknowledged these results.   Electronically Signed   By: Julian Hy M.D.   On: 08/13/2013 15:02     EKG Interpretation   Date/Time:  Wednesday August 13 2013 15:27:37 EDT Ventricular Rate:  88 PR Interval:  153 QRS Duration: 101 QT Interval:  406 QTC Calculation: 491 R Axis:   -33 Text Interpretation:  Sinus rhythm Abnormal R-wave progression, late  transition Left ventricular hypertrophy Borderline prolonged QT interval  ED PHYSICIAN INTERPRETATION AVAILABLE IN [facility] Confirmed by  TEST, Record ([id number]) on 08/15/2013 7:38:35 AM      MDM   Final diagnoses:  Acute appendicitis, unspecified acute appendicitis type    1:00 PM Labs and CT abdomen pelvis pending for appendicitis rule out.   3:32 PM Patient has elevated WBCs. CT shows acute appendicitis. I paged general surgery for admission. Vitals stable and patient afebrile. Patient is  NPO.    Alvina Chou, PA-C 08/15/13 1048

## 2013-08-13 NOTE — H&P (Signed)
Patient interviewed and examined, agree with PA note above. Plan laparoscopic appendectomy later this evening. Will discuss with Dr. Hassell Done who is coming on-call.  Edward Jolly MD, FACS  08/13/2013 5:04 PM

## 2013-08-14 DIAGNOSIS — K3580 Unspecified acute appendicitis: Secondary | ICD-10-CM | POA: Diagnosis present

## 2013-08-14 DIAGNOSIS — S72009A Fracture of unspecified part of neck of unspecified femur, initial encounter for closed fracture: Secondary | ICD-10-CM | POA: Diagnosis not present

## 2013-08-14 DIAGNOSIS — D62 Acute posthemorrhagic anemia: Secondary | ICD-10-CM | POA: Diagnosis not present

## 2013-08-14 LAB — CBC
HCT: 36.8 % (ref 36.0–46.0)
Hemoglobin: 12.4 g/dL (ref 12.0–15.0)
MCH: 29.9 pg (ref 26.0–34.0)
MCHC: 33.7 g/dL (ref 30.0–36.0)
MCV: 88.7 fL (ref 78.0–100.0)
Platelets: 211 10*3/uL (ref 150–400)
RBC: 4.15 MIL/uL (ref 3.87–5.11)
RDW: 13.3 % (ref 11.5–15.5)
WBC: 9.7 10*3/uL (ref 4.0–10.5)

## 2013-08-14 MED ORDER — OXYCODONE-ACETAMINOPHEN 5-325 MG PO TABS: 1.0000 | ORAL_TABLET | Freq: Four times a day (QID) | ORAL | Status: DC | PRN

## 2013-08-14 NOTE — Discharge Summary (Signed)
[facility] Discharge Summary   Patient ID: Gabrielle [name] MRN: [id number] DOB/AGE: [birth year] 68 y.o.  Admit date: 08/13/2013 Discharge date: 08/14/2013  Admitting Diagnosis: Acute appendicitis  Discharge Diagnosis Patient Active Problem List   Diagnosis Date Noted  . Appendicitis, acute 08/14/2013  . S/P laparoscopic appendectomy 08/13/2013  . Neuropathy 11/11/2012    Consultants None  Imaging: Ct Abdomen Pelvis W Contrast  08/13/2013   CLINICAL DATA:  Right lower quadrant abdominal pain  EXAM: CT ABDOMEN AND PELVIS WITH CONTRAST  TECHNIQUE: Multidetector CT imaging of the abdomen and pelvis was performed using the standard protocol following bolus administration of intravenous contrast.  CONTRAST:  167mL OMNIPAQUE IOHEXOL 300 MG/ML  SOLN  COMPARISON:  None.  FINDINGS: Lung bases are essentially clear.  Liver, spleen, pancreas, and adrenal glands are within normal limits.  Gallbladder is unremarkable. No intrahepatic or extrahepatic ductal dilatation.  Left renal scarring/atrophy. Right kidney is grossly unremarkable. No hydronephrosis.  No evidence of bowel obstruction. Dilated/abnormal appendix, measuring up to 11 mm (series 2/ image 49), with suspected mild periappendiceal inflammatory changes (series 2/image 55). Mobile cecum in the anterior left mid abdomen.  No drainable fluid collection/ abscess.  No free air.  Atherosclerotic calcifications of the abdominal aorta and branch vessels.  No abdominopelvic ascites.  Small retroperitoneal lymph nodes which do not meet pathologic CT size criteria.  Status post hysterectomy.  No adnexal masses.  Moderately distended bladder.  Degenerative changes of the visualized thoracolumbar spine. Status post PLIF at L4-5.  IMPRESSION: Suspected early acute appendicitis.  No drainable fluid collections is abscess.  No free air.  These results were called by telephone at the time of interpretation on 08/13/2013 at 3:01 pm to Dr. Alvina Chou , who verbally acknowledged these results.   Electronically Signed   By: Julian Hy M.D.   On: 08/13/2013 15:02    Procedures Dr. Hassell Done (08/13/13) - Laparoscopic Appendectomy  Hospital Course:  68 year old female with a PMH of hypertension, GERD, and arthritis who presents with abdominal pain for the past 2 days. The pain started in her upper abdomen and moved to her RLQ early this am (08/13/13). The pain is now located in her RLQ and does not radiate. The pain is described as aching and severe. The pain started gradually and progressively worsened since the onset (08/11/13). No alleviating factors. She tried OTC antacids/antiemetics/gas relief without relief. Any movement or palpation makes the pain worse. Associated symptoms include distension. Patient was sent here from her PCP for appendicitis rule out. Patient denies fever, headache, NVD, chest pain, SOB, dysuria, abnormal vaginal bleeding/discharge. No BM since Sunday and no flatus today. Never had this pain before. PSH include hysterectomy and bladder sling.  Workup showed acute appendicitis.  Patient was admitted and underwent procedure listed above.  Tolerated procedure well and was transferred to the floor.  Diet was advanced as tolerated.  On POD #1, the patient was voiding well, tolerating diet, ambulating well, pain well controlled, vital signs stable, incisions c/d/i and felt stable for discharge home.  Patient will follow up in our office in 3 weeks and knows to call with questions or concerns.  Physical Exam: General:  Alert, NAD, pleasant, comfortable Abd:  Soft, ND, mild tenderness, incisions C/D/I     Medication List         amLODipine 5 MG tablet  Commonly known as:  NORVASC  Take 5 mg by mouth every morning.     aspirin EC 81  MG tablet  Take 81 mg by mouth at bedtime.     benazepril 20 MG tablet  Commonly known as:  LOTENSIN  Take 20 mg by mouth 2 (two) times daily.     BIOTIN PO  Take 1 tablet by  mouth 2 (two) times daily.     CALCIUM 600 + D PO  Take 1 tablet by mouth 2 (two) times daily.     docusate sodium 100 MG capsule  Commonly known as:  COLACE  Take 100 mg by mouth every morning.     fish oil-omega-3 fatty acids 1000 MG capsule  Take 1 g by mouth at bedtime.     glucosamine-chondroitin 500-400 MG tablet  Take 2 tablets by mouth daily with breakfast.     KENALOG 40 MG/ML injection  Generic drug:  triamcinolone acetonide  Inject 40 mg into the muscle once.     levothyroxine 75 MCG tablet  Commonly known as:  SYNTHROID, LEVOTHROID  Take 75 mcg by mouth every morning.     loratadine-pseudoephedrine 10-240 MG per 24 hr tablet  Commonly known as:  CLARITIN-D 24-hour  Take 1 tablet by mouth at bedtime.     magnesium citrate Soln  Take 0.5 Bottles by mouth once.     Melatonin 3 MG Tabs  Take 1 tablet by mouth at bedtime.     omeprazole 20 MG capsule  Commonly known as:  PRILOSEC  Take 20 mg by mouth daily with breakfast.     oxyCODONE-acetaminophen 5-325 MG per tablet  Commonly known as:  PERCOCET/ROXICET  Take 1-2 tablets by mouth every 6 (six) hours as needed for moderate pain.     polyethylene glycol packet  Commonly known as:  MIRALAX / GLYCOLAX  Take 17 g by mouth daily as needed (for constipation).     potassium chloride 10 MEQ tablet  Commonly known as:  K-DUR,KLOR-CON  Take 10 mEq by mouth daily.     triamterene-hydrochlorothiazide 37.5-25 MG per capsule  Commonly known as:  DYAZIDE  Take 1 capsule by mouth every morning.         Follow-up Information   Follow up with [facility] On 09/09/2013. (For post-operation check. Your appointment is at 2:30pm, please arrive at least 30 min before your appointment to complete your check in paperwork.  If you are unable to arrive 30 min prior to your appointment time we may have to cancel or reschedule you)    Contact information:   [address]   [place]  [id number] [phone]       Signed: Excell Seltzer [facility] [phone]  08/14/2013, 7:01 AM

## 2013-08-14 NOTE — Progress Notes (Signed)
08/14/13  0801  Reviewed discharge instructions with patient. Patient verbalized understanding of discharge instructions. Copy of discharge instructions and prescription given to patient.

## 2013-08-14 NOTE — Discharge Instructions (Signed)
Your appointment is at 2:30pm, please arrive at least 30 min before your appointment to complete your check in paperwork.  If you are unable to arrive 30 min prior to your appointment time we may have to cancel or reschedule you.  LAPAROSCOPIC SURGERY: POST OP INSTRUCTIONS  1. DIET: Follow a light bland diet the first 24 hours after arrival home, such as soup, liquids, crackers, etc. Be sure to include lots of fluids daily. Avoid fast food or heavy meals as your are more likely to get nauseated. Eat a low fat the next few days after surgery.  2. Take your usually prescribed home medications unless otherwise directed. 3. PAIN CONTROL:  a. Pain is best controlled by a usual combination of three different methods TOGETHER:  i. Ice/Heat ii. Over the counter pain medication iii. Prescription pain medication b. Most patients will experience some swelling and bruising around the incisions. Ice packs or heating pads (30-60 minutes up to 6 times a day) will help. Use ice for the first few days to help decrease swelling and bruising, then switch to heat to help relax tight/sore spots and speed recovery. Some people prefer to use ice alone, heat alone, alternating between ice & heat. Experiment to what works for you. Swelling and bruising can take several weeks to resolve.  c. It is helpful to take an over-the-counter pain medication regularly for the first few weeks. Choose one of the following that works best for you:  i. Naproxen (Aleve, etc) Two 220mg  tabs twice a day ii. Ibuprofen (Advil, etc) Three 200mg  tabs four times a day (every meal & bedtime) iii. Acetaminophen (Tylenol, etc) 500-650mg  four times a day (every meal & bedtime) d. A prescription for pain medication (such as oxycodone, hydrocodone, etc) should be given to you upon discharge. Take your pain medication as prescribed.  i. If you are having problems/concerns with the prescription medicine (does not control pain, nausea, vomiting, rash,  itching, etc), please call us [phone] to see if we need to switch you to a different pain medicine that will work better for you and/or control your side effect better. ii. If you need a refill on your pain medication, please contact your pharmacy. They will contact our office to request authorization. Prescriptions will not be filled after 5 pm or on week-ends. 4. Avoid getting constipated. Between the surgery and the pain medications, it is common to experience some constipation. Increasing fluid intake and taking a fiber supplement (such as Metamucil, Citrucel, FiberCon, MiraLax, etc) 1-2 times a day regularly will usually help prevent this problem from occurring. A mild laxative (prune juice, Milk of Magnesia, MiraLax, etc) should be taken according to package directions if there are no bowel movements after 48 hours.  5. Watch out for diarrhea. If you have many loose bowel movements, simplify your diet to bland foods & liquids for a few days. Stop any stool softeners and decrease your fiber supplement. Switching to mild anti-diarrheal medications (Kayopectate, Pepto Bismol) can help. If this worsens or does not improve, please call us. 6. Wash / shower every day. You may shower over the dressings as they are waterproof. Continue to shower over incision(s) after the dressing is off. 7. Remove your waterproof bandages 5 days after surgery. You may leave the incision open to air. You may replace a dressing/Band-Aid to cover the incision for comfort if you wish.  8. ACTIVITIES as tolerated:  a. You may resume regular (light) daily activities beginning the next day--such as  daily self-care, walking, climbing stairs--gradually increasing activities as tolerated. If you can walk 30 minutes without difficulty, it is safe to try more intense activity such as jogging, treadmill, bicycling, low-impact aerobics, swimming, etc. b. Save the most intensive and strenuous activity for last such as sit-ups, heavy  lifting, contact sports, etc Refrain from any heavy lifting or straining until you are off narcotics for pain control.  c. DO NOT PUSH THROUGH PAIN. Let pain be your guide: If it hurts to do something, don't do it. Pain is your body warning you to avoid that activity for another week until the pain goes down. d. You may drive when you are no longer taking prescription pain medication, you can comfortably wear a seatbelt, and you can safely maneuver your car and apply brakes. e. Dennis Bast may have sexual intercourse when it is comfortable.  9. FOLLOW UP in our office  a. Please call [facility] at [phone] to set up an appointment to see your surgeon in the office for a follow-up appointment approximately 2-3 weeks after your surgery. b. Make sure that you call for this appointment the day you arrive home to insure a convenient appointment time.      10. IF YOU HAVE DISABILITY OR FAMILY LEAVE FORMS, BRING THEM TO THE               OFFICE FOR PROCESSING.   WHEN TO CALL us [phone]:  1. Poor pain control 2. Reactions / problems with new medications (rash/itching, nausea, etc)  3. Fever over 101.5 F (38.5 C) 4. Inability to urinate 5. Nausea and/or vomiting 6. Worsening swelling or bruising 7. Continued bleeding from incision. 8. Increased pain, redness, or drainage from the incision  The clinic staff is available to answer your questions during regular business hours (8:30am-5pm). Please dont hesitate to call and ask to speak to one of our nurses for clinical concerns.  If you have a medical emergency, go to the nearest emergency room or call 911.  A surgeon from [facility] is always on call at the [facility], [address], [place], [place], [place] [address] ?  MAIN: [phone] ? TOLL FREE: [phone] ?  FAX [phone]  [email or web address]

## 2013-08-15 ENCOUNTER — Encounter (HOSPITAL_COMMUNITY): Payer: Self-pay | Admitting: Surgery

## 2013-08-15 DIAGNOSIS — S72009A Fracture of unspecified part of neck of unspecified femur, initial encounter for closed fracture: Secondary | ICD-10-CM | POA: Diagnosis not present

## 2013-08-15 DIAGNOSIS — D62 Acute posthemorrhagic anemia: Secondary | ICD-10-CM | POA: Diagnosis not present

## 2013-08-16 DIAGNOSIS — D62 Acute posthemorrhagic anemia: Secondary | ICD-10-CM | POA: Diagnosis not present

## 2013-08-16 DIAGNOSIS — S72009A Fracture of unspecified part of neck of unspecified femur, initial encounter for closed fracture: Secondary | ICD-10-CM | POA: Diagnosis not present

## 2013-08-17 DIAGNOSIS — S72009A Fracture of unspecified part of neck of unspecified femur, initial encounter for closed fracture: Secondary | ICD-10-CM | POA: Diagnosis not present

## 2013-08-17 DIAGNOSIS — D62 Acute posthemorrhagic anemia: Secondary | ICD-10-CM | POA: Diagnosis not present

## 2013-08-17 NOTE — ED Provider Notes (Signed)
Medical screening examination/treatment/procedure(s) were performed by non-physician practitioner and as supervising physician I was immediately available for consultation/collaboration.   EKG Interpretation   Date/Time:  Wednesday August 13 2013 15:27:37 EDT Ventricular Rate:  88 PR Interval:  153 QRS Duration: 101 QT Interval:  406 QTC Calculation: 491 R Axis:   -33 Text Interpretation:  Sinus rhythm Abnormal R-wave progression, late  transition Left ventricular hypertrophy Borderline prolonged QT interval  ED PHYSICIAN INTERPRETATION AVAILABLE IN [facility] Confirmed by  TEST, Record ([id number]) on 08/15/2013 7:38:35 AM        Leota Jacobsen, MD 08/17/13 0800

## 2013-08-18 DIAGNOSIS — S72009A Fracture of unspecified part of neck of unspecified femur, initial encounter for closed fracture: Secondary | ICD-10-CM | POA: Diagnosis not present

## 2013-08-18 DIAGNOSIS — D62 Acute posthemorrhagic anemia: Secondary | ICD-10-CM | POA: Diagnosis not present

## 2013-08-27 DIAGNOSIS — R229 Localized swelling, mass and lump, unspecified: Secondary | ICD-10-CM | POA: Diagnosis not present

## 2013-09-09 ENCOUNTER — Encounter (INDEPENDENT_AMBULATORY_CARE_PROVIDER_SITE_OTHER): Payer: Self-pay

## 2013-09-09 ENCOUNTER — Ambulatory Visit (INDEPENDENT_AMBULATORY_CARE_PROVIDER_SITE_OTHER): Payer: Medicare Other | Admitting: General Surgery

## 2013-09-09 VITALS — BP 124/70 | HR 64 | Temp 97.4°F | Resp 14 | Ht 60.5 in | Wt 167.8 lb

## 2013-09-09 DIAGNOSIS — Z9889 Other specified postprocedural states: Secondary | ICD-10-CM

## 2013-09-09 DIAGNOSIS — Z9049 Acquired absence of other specified parts of digestive tract: Secondary | ICD-10-CM

## 2013-09-09 DIAGNOSIS — K3580 Unspecified acute appendicitis: Secondary | ICD-10-CM

## 2013-09-09 NOTE — Patient Instructions (Signed)
She may resume a regular diet and full activity.  She may follow-up on a as needed basis.

## 2013-09-09 NOTE — Progress Notes (Signed)
  Subjective: Gabrielle [name] is a 68 y.o. female who had a laparoscopic appendectomy with on 08/14/13 by Dr. Hassell Done.  The pathology report confirmed Acute appendicitis.  The patient is tolerating their diet well and is having no severe pain.  Bowel function is good.  No problems with the wounds.  Pt is returning to normal activity/yoga.   Objective: Vital signs in last 24 hours: Reviewed   PE: General:  Alert, NAD, pleasant Abd: soft, NT/ND, +bs, incisions appear well-healed with no sign of infection or bleeding.     Assessment/Plan  1.  S/P Laparoscopic Appendectomy: doing well, may resume regular activity without restrictions (Yoga okay), Pt will follow up with [place] [facility] and knows to call with questions or concerns.      Coralie Keens, PA-C 09/09/2013

## 2013-09-19 DIAGNOSIS — M19049 Primary osteoarthritis, unspecified hand: Secondary | ICD-10-CM | POA: Diagnosis not present

## 2013-09-19 DIAGNOSIS — M25539 Pain in unspecified wrist: Secondary | ICD-10-CM | POA: Diagnosis not present

## 2013-11-07 DIAGNOSIS — M7071 Other bursitis of hip, right hip: Secondary | ICD-10-CM | POA: Diagnosis not present

## 2013-11-14 DIAGNOSIS — Z23 Encounter for immunization: Secondary | ICD-10-CM | POA: Diagnosis not present

## 2013-11-14 DIAGNOSIS — Z0000 Encounter for general adult medical examination without abnormal findings: Secondary | ICD-10-CM | POA: Diagnosis not present

## 2013-11-14 DIAGNOSIS — I10 Essential (primary) hypertension: Secondary | ICD-10-CM | POA: Diagnosis not present

## 2013-11-14 DIAGNOSIS — M8580 Other specified disorders of bone density and structure, unspecified site: Secondary | ICD-10-CM | POA: Diagnosis not present

## 2013-11-14 DIAGNOSIS — R7301 Impaired fasting glucose: Secondary | ICD-10-CM | POA: Diagnosis not present

## 2013-11-14 DIAGNOSIS — G629 Polyneuropathy, unspecified: Secondary | ICD-10-CM | POA: Diagnosis not present

## 2013-11-14 DIAGNOSIS — E782 Mixed hyperlipidemia: Secondary | ICD-10-CM | POA: Diagnosis not present

## 2013-11-14 DIAGNOSIS — J309 Allergic rhinitis, unspecified: Secondary | ICD-10-CM | POA: Diagnosis not present

## 2013-11-14 DIAGNOSIS — E039 Hypothyroidism, unspecified: Secondary | ICD-10-CM | POA: Diagnosis not present

## 2013-12-30 DIAGNOSIS — Z23 Encounter for immunization: Secondary | ICD-10-CM | POA: Diagnosis not present

## 2014-01-16 DIAGNOSIS — Z1231 Encounter for screening mammogram for malignant neoplasm of breast: Secondary | ICD-10-CM | POA: Diagnosis not present

## 2014-01-16 DIAGNOSIS — M8580 Other specified disorders of bone density and structure, unspecified site: Secondary | ICD-10-CM | POA: Diagnosis not present

## 2014-03-23 DIAGNOSIS — K146 Glossodynia: Secondary | ICD-10-CM | POA: Diagnosis not present

## 2014-05-26 DIAGNOSIS — S90935A Unspecified superficial injury of left lesser toe(s), initial encounter: Secondary | ICD-10-CM | POA: Diagnosis not present

## 2014-07-11 DIAGNOSIS — Z4789 Encounter for other orthopedic aftercare: Secondary | ICD-10-CM | POA: Diagnosis not present

## 2014-07-11 DIAGNOSIS — M25551 Pain in right hip: Secondary | ICD-10-CM | POA: Diagnosis not present

## 2014-07-11 DIAGNOSIS — M7071 Other bursitis of hip, right hip: Secondary | ICD-10-CM | POA: Diagnosis not present

## 2014-09-29 DIAGNOSIS — H524 Presbyopia: Secondary | ICD-10-CM | POA: Diagnosis not present

## 2014-09-29 DIAGNOSIS — H52223 Regular astigmatism, bilateral: Secondary | ICD-10-CM | POA: Diagnosis not present

## 2014-09-29 DIAGNOSIS — H5203 Hypermetropia, bilateral: Secondary | ICD-10-CM | POA: Diagnosis not present

## 2014-09-29 DIAGNOSIS — H2513 Age-related nuclear cataract, bilateral: Secondary | ICD-10-CM | POA: Diagnosis not present

## 2014-10-15 DIAGNOSIS — Z23 Encounter for immunization: Secondary | ICD-10-CM | POA: Diagnosis not present

## 2014-11-16 DIAGNOSIS — Z4789 Encounter for other orthopedic aftercare: Secondary | ICD-10-CM | POA: Diagnosis not present

## 2014-11-16 DIAGNOSIS — M7071 Other bursitis of hip, right hip: Secondary | ICD-10-CM | POA: Diagnosis not present

## 2014-11-17 DIAGNOSIS — M533 Sacrococcygeal disorders, not elsewhere classified: Secondary | ICD-10-CM | POA: Diagnosis not present

## 2014-12-30 DIAGNOSIS — E782 Mixed hyperlipidemia: Secondary | ICD-10-CM | POA: Diagnosis not present

## 2014-12-30 DIAGNOSIS — E039 Hypothyroidism, unspecified: Secondary | ICD-10-CM | POA: Diagnosis not present

## 2014-12-30 DIAGNOSIS — G629 Polyneuropathy, unspecified: Secondary | ICD-10-CM | POA: Diagnosis not present

## 2014-12-30 DIAGNOSIS — N189 Chronic kidney disease, unspecified: Secondary | ICD-10-CM | POA: Diagnosis not present

## 2014-12-30 DIAGNOSIS — R7301 Impaired fasting glucose: Secondary | ICD-10-CM | POA: Diagnosis not present

## 2014-12-30 DIAGNOSIS — Z0000 Encounter for general adult medical examination without abnormal findings: Secondary | ICD-10-CM | POA: Diagnosis not present

## 2014-12-30 DIAGNOSIS — I10 Essential (primary) hypertension: Secondary | ICD-10-CM | POA: Diagnosis not present

## 2014-12-30 DIAGNOSIS — M8580 Other specified disorders of bone density and structure, unspecified site: Secondary | ICD-10-CM | POA: Diagnosis not present

## 2014-12-30 DIAGNOSIS — J309 Allergic rhinitis, unspecified: Secondary | ICD-10-CM | POA: Diagnosis not present

## 2014-12-30 DIAGNOSIS — M899 Disorder of bone, unspecified: Secondary | ICD-10-CM | POA: Diagnosis not present

## 2014-12-30 DIAGNOSIS — H6123 Impacted cerumen, bilateral: Secondary | ICD-10-CM | POA: Diagnosis not present

## 2015-01-01 DIAGNOSIS — M7071 Other bursitis of hip, right hip: Secondary | ICD-10-CM | POA: Diagnosis not present

## 2015-01-01 DIAGNOSIS — Z4789 Encounter for other orthopedic aftercare: Secondary | ICD-10-CM | POA: Diagnosis not present

## 2015-01-01 DIAGNOSIS — Z4889 Encounter for other specified surgical aftercare: Secondary | ICD-10-CM | POA: Diagnosis not present

## 2015-01-08 DIAGNOSIS — I10 Essential (primary) hypertension: Secondary | ICD-10-CM | POA: Diagnosis not present

## 2015-01-08 DIAGNOSIS — J309 Allergic rhinitis, unspecified: Secondary | ICD-10-CM | POA: Diagnosis not present

## 2015-01-08 DIAGNOSIS — M899 Disorder of bone, unspecified: Secondary | ICD-10-CM | POA: Diagnosis not present

## 2015-01-08 DIAGNOSIS — R7301 Impaired fasting glucose: Secondary | ICD-10-CM | POA: Diagnosis not present

## 2015-01-08 DIAGNOSIS — E782 Mixed hyperlipidemia: Secondary | ICD-10-CM | POA: Diagnosis not present

## 2015-01-08 DIAGNOSIS — N189 Chronic kidney disease, unspecified: Secondary | ICD-10-CM | POA: Diagnosis not present

## 2015-01-08 DIAGNOSIS — E039 Hypothyroidism, unspecified: Secondary | ICD-10-CM | POA: Diagnosis not present

## 2015-01-08 DIAGNOSIS — Z0000 Encounter for general adult medical examination without abnormal findings: Secondary | ICD-10-CM | POA: Diagnosis not present

## 2015-01-08 DIAGNOSIS — M8588 Other specified disorders of bone density and structure, other site: Secondary | ICD-10-CM | POA: Diagnosis not present

## 2015-01-08 DIAGNOSIS — M545 Low back pain: Secondary | ICD-10-CM | POA: Diagnosis not present

## 2015-01-15 DIAGNOSIS — Z4789 Encounter for other orthopedic aftercare: Secondary | ICD-10-CM | POA: Diagnosis not present

## 2015-02-09 DIAGNOSIS — Z1231 Encounter for screening mammogram for malignant neoplasm of breast: Secondary | ICD-10-CM | POA: Diagnosis not present

## 2015-04-16 DIAGNOSIS — M4806 Spinal stenosis, lumbar region: Secondary | ICD-10-CM | POA: Diagnosis not present

## 2015-04-16 DIAGNOSIS — M545 Low back pain: Secondary | ICD-10-CM | POA: Diagnosis not present

## 2015-04-16 DIAGNOSIS — M7071 Other bursitis of hip, right hip: Secondary | ICD-10-CM | POA: Diagnosis not present

## 2015-04-16 DIAGNOSIS — M1288 Other specific arthropathies, not elsewhere classified, other specified site: Secondary | ICD-10-CM | POA: Diagnosis not present

## 2015-04-23 DIAGNOSIS — R7301 Impaired fasting glucose: Secondary | ICD-10-CM | POA: Diagnosis not present

## 2015-04-23 DIAGNOSIS — Z79899 Other long term (current) drug therapy: Secondary | ICD-10-CM | POA: Diagnosis not present

## 2015-04-23 DIAGNOSIS — E782 Mixed hyperlipidemia: Secondary | ICD-10-CM | POA: Diagnosis not present

## 2015-07-06 DIAGNOSIS — M545 Low back pain: Secondary | ICD-10-CM | POA: Diagnosis not present

## 2015-07-06 DIAGNOSIS — M7061 Trochanteric bursitis, right hip: Secondary | ICD-10-CM | POA: Diagnosis not present

## 2015-07-06 DIAGNOSIS — M4806 Spinal stenosis, lumbar region: Secondary | ICD-10-CM | POA: Diagnosis not present

## 2015-08-21 ENCOUNTER — Encounter (HOSPITAL_COMMUNITY)
Admission: EM | Disposition: A | Discharge status: Home / Self Care | Payer: Self-pay | Source: Home / Self Care | Attending: Cardiovascular Disease

## 2015-08-21 ENCOUNTER — Inpatient Hospital Stay (HOSPITAL_COMMUNITY): Payer: Medicare Other

## 2015-08-21 ENCOUNTER — Inpatient Hospital Stay (HOSPITAL_COMMUNITY)
Admission: EM | Admit: 2015-08-21 | Discharge: 2015-08-24 | DRG: 247 | Disposition: A | Discharge status: Home / Self Care | Payer: Medicare Other | Attending: Cardiovascular Disease | Admitting: Cardiovascular Disease

## 2015-08-21 ENCOUNTER — Encounter (HOSPITAL_COMMUNITY): Payer: Self-pay | Admitting: Emergency Medicine

## 2015-08-21 ENCOUNTER — Ambulatory Visit (HOSPITAL_COMMUNITY): Admit: 2015-08-21 | Payer: Self-pay | Admitting: Cardiovascular Disease

## 2015-08-21 DIAGNOSIS — Z87891 Personal history of nicotine dependence: Secondary | ICD-10-CM

## 2015-08-21 DIAGNOSIS — Y838 Other surgical procedures as the cause of abnormal reaction of the patient, or of later complication, without mention of misadventure at the time of the procedure: Secondary | ICD-10-CM | POA: Diagnosis not present

## 2015-08-21 DIAGNOSIS — S40029A Contusion of unspecified upper arm, initial encounter: Secondary | ICD-10-CM

## 2015-08-21 DIAGNOSIS — Z7982 Long term (current) use of aspirin: Secondary | ICD-10-CM

## 2015-08-21 DIAGNOSIS — K219 Gastro-esophageal reflux disease without esophagitis: Secondary | ICD-10-CM | POA: Diagnosis not present

## 2015-08-21 DIAGNOSIS — R011 Cardiac murmur, unspecified: Secondary | ICD-10-CM | POA: Diagnosis not present

## 2015-08-21 DIAGNOSIS — I252 Old myocardial infarction: Secondary | ICD-10-CM | POA: Diagnosis present

## 2015-08-21 DIAGNOSIS — L7632 Postprocedural hematoma of skin and subcutaneous tissue following other procedure: Secondary | ICD-10-CM | POA: Diagnosis not present

## 2015-08-21 DIAGNOSIS — Y92239 Unspecified place in hospital as the place of occurrence of the external cause: Secondary | ICD-10-CM | POA: Diagnosis not present

## 2015-08-21 DIAGNOSIS — I213 ST elevation (STEMI) myocardial infarction of unspecified site: Secondary | ICD-10-CM | POA: Diagnosis not present

## 2015-08-21 DIAGNOSIS — Z9049 Acquired absence of other specified parts of digestive tract: Secondary | ICD-10-CM

## 2015-08-21 DIAGNOSIS — Z888 Allergy status to other drugs, medicaments and biological substances status: Secondary | ICD-10-CM | POA: Diagnosis not present

## 2015-08-21 DIAGNOSIS — E785 Hyperlipidemia, unspecified: Secondary | ICD-10-CM | POA: Diagnosis present

## 2015-08-21 DIAGNOSIS — Z880 Allergy status to penicillin: Secondary | ICD-10-CM | POA: Diagnosis not present

## 2015-08-21 DIAGNOSIS — Z955 Presence of coronary angioplasty implant and graft: Secondary | ICD-10-CM

## 2015-08-21 DIAGNOSIS — R079 Chest pain, unspecified: Secondary | ICD-10-CM | POA: Diagnosis not present

## 2015-08-21 DIAGNOSIS — I2121 ST elevation (STEMI) myocardial infarction involving left circumflex coronary artery: Principal | ICD-10-CM | POA: Diagnosis present

## 2015-08-21 DIAGNOSIS — I2510 Atherosclerotic heart disease of native coronary artery without angina pectoris: Secondary | ICD-10-CM | POA: Diagnosis not present

## 2015-08-21 DIAGNOSIS — I25118 Atherosclerotic heart disease of native coronary artery with other forms of angina pectoris: Secondary | ICD-10-CM | POA: Diagnosis not present

## 2015-08-21 DIAGNOSIS — I10 Essential (primary) hypertension: Secondary | ICD-10-CM | POA: Diagnosis present

## 2015-08-21 HISTORY — PX: CARDIAC CATHETERIZATION: SHX172

## 2015-08-21 HISTORY — DX: ST elevation (STEMI) myocardial infarction involving left circumflex coronary artery: I21.21

## 2015-08-21 LAB — CREATININE, SERUM
Creatinine, Ser: 1.15 mg/dL — ABNORMAL HIGH (ref 0.44–1.00)
GFR calc Af Amer: 55 mL/min — ABNORMAL LOW (ref >60)
GFR calc non Af Amer: 47 mL/min — ABNORMAL LOW (ref >60)

## 2015-08-21 LAB — I-STAT CHEM 8, ED
BUN: 30 mg/dL — ABNORMAL HIGH (ref 6–20)
Calcium, Ion: 1.16 mmol/L (ref 1.12–1.23)
Chloride: 101 mmol/L (ref 101–111)
Creatinine, Ser: 1.1 mg/dL — ABNORMAL HIGH (ref 0.44–1.00)
Glucose, Bld: 168 mg/dL — ABNORMAL HIGH (ref 65–99)
HCT: 38 % (ref 36.0–46.0)
Hemoglobin: 12.9 g/dL (ref 12.0–15.0)
Potassium: 3.4 mmol/L — ABNORMAL LOW (ref 3.5–5.1)
Sodium: 137 mmol/L (ref 135–145)
TCO2: 22 mmol/L (ref 0–100)

## 2015-08-21 LAB — I-STAT TROPONIN, ED: Troponin i, poc: 0.06 ng/mL (ref 0.00–0.08)

## 2015-08-21 LAB — CBC
HCT: 37.7 % (ref 36.0–46.0)
Hemoglobin: 12.5 g/dL (ref 12.0–15.0)
MCH: 30.3 pg (ref 26.0–34.0)
MCHC: 33.2 g/dL (ref 30.0–36.0)
MCV: 91.5 fL (ref 78.0–100.0)
Platelets: 188 10*3/uL (ref 150–400)
RBC: 4.12 MIL/uL (ref 3.87–5.11)
RDW: 13.1 % (ref 11.5–15.5)
WBC: 15 10*3/uL — ABNORMAL HIGH (ref 4.0–10.5)

## 2015-08-21 LAB — BRAIN NATRIURETIC PEPTIDE: B Natriuretic Peptide: 27.7 pg/mL (ref 0.0–100.0)

## 2015-08-21 LAB — TSH: TSH: 1.982 u[IU]/mL (ref 0.350–4.500)

## 2015-08-21 LAB — MRSA PCR SCREENING: MRSA by PCR: NEGATIVE

## 2015-08-21 LAB — TROPONIN I: Troponin I: 26.21 ng/mL (ref <0.03)

## 2015-08-21 LAB — I-STAT CG4 LACTIC ACID, ED: Lactic Acid, Venous: 2.74 mmol/L (ref 0.5–1.9)

## 2015-08-21 SURGERY — LEFT HEART CATH AND CORONARY ANGIOGRAPHY
Anesthesia: LOCAL

## 2015-08-21 MED ORDER — HEPARIN SODIUM (PORCINE) 1000 UNIT/ML IJ SOLN
INTRAMUSCULAR | Status: DC | PRN
  Administered 2015-08-21: 4000 [IU] via INTRAVENOUS

## 2015-08-21 MED ORDER — AMLODIPINE BESYLATE 5 MG PO TABS
5.0000 mg | ORAL_TABLET | Freq: Every morning | ORAL | Status: DC
  Administered 2015-08-22 – 2015-08-24 (×3): 5 mg via ORAL
  Filled 2015-08-21 (×3): qty 1

## 2015-08-21 MED ORDER — MELATONIN 3 MG PO TABS: 1.0000 | ORAL_TABLET | Freq: Every day | ORAL | Status: DC

## 2015-08-21 MED ORDER — HEPARIN (PORCINE) IN NACL 2-0.9 UNIT/ML-% IJ SOLN
INTRAMUSCULAR | Status: DC | PRN
  Administered 2015-08-21: 1000 mL

## 2015-08-21 MED ORDER — MORPHINE SULFATE (PF) 2 MG/ML IV SOLN: 2.0000 mg | INTRAVENOUS | Status: DC | PRN

## 2015-08-21 MED ORDER — SODIUM CHLORIDE 0.9 % WEIGHT BASED INFUSION: 1.0000 mL/kg/h | INTRAVENOUS | Status: DC

## 2015-08-21 MED ORDER — TICAGRELOR 90 MG PO TABS
ORAL_TABLET | ORAL | Status: AC
  Filled 2015-08-21: qty 1

## 2015-08-21 MED ORDER — ACETAMINOPHEN 325 MG PO TABS: 650.0000 mg | ORAL_TABLET | ORAL | Status: DC | PRN

## 2015-08-21 MED ORDER — NITROGLYCERIN 1 MG/10 ML FOR IR/CATH LAB
INTRA_ARTERIAL | Status: AC
  Filled 2015-08-21: qty 20

## 2015-08-21 MED ORDER — BIVALIRUDIN 250 MG IV SOLR
INTRAVENOUS | Status: AC
  Filled 2015-08-21: qty 250

## 2015-08-21 MED ORDER — DOCUSATE SODIUM 100 MG PO CAPS
100.0000 mg | ORAL_CAPSULE | Freq: Every morning | ORAL | Status: DC
  Administered 2015-08-24: 10:00:00 100 mg via ORAL
  Filled 2015-08-21: qty 1

## 2015-08-21 MED ORDER — NITROGLYCERIN 1 MG/10 ML FOR IR/CATH LAB
INTRA_ARTERIAL | Status: DC | PRN
  Administered 2015-08-21 (×2): 200 ug via INTRACORONARY

## 2015-08-21 MED ORDER — POTASSIUM CHLORIDE CRYS ER 10 MEQ PO TBCR
10.0000 meq | EXTENDED_RELEASE_TABLET | Freq: Every day | ORAL | Status: DC
  Administered 2015-08-22 – 2015-08-24 (×3): 10 meq via ORAL
  Filled 2015-08-21 (×4): qty 1

## 2015-08-21 MED ORDER — SODIUM CHLORIDE 0.9 % IV SOLN
INTRAVENOUS | Status: DC | PRN
  Administered 2015-08-21: 76 mL via INTRAVENOUS
  Administered 2015-08-21: 1 mL/kg/h via INTRAVENOUS
  Administered 2015-08-21: 250 mL via INTRAVENOUS

## 2015-08-21 MED ORDER — HEPARIN SODIUM (PORCINE) 1000 UNIT/ML IJ SOLN
INTRAMUSCULAR | Status: AC
  Filled 2015-08-21: qty 2

## 2015-08-21 MED ORDER — SODIUM CHLORIDE 0.9 % WEIGHT BASED INFUSION: 1.0000 mL/kg/h | INTRAVENOUS | Status: AC

## 2015-08-21 MED ORDER — ASPIRIN EC 81 MG PO TBEC
81.0000 mg | DELAYED_RELEASE_TABLET | Freq: Every day | ORAL | Status: DC
Start: 2015-08-22 — End: 2015-08-24
  Administered 2015-08-22 – 2015-08-23 (×2): 81 mg via ORAL
  Filled 2015-08-21 (×3): qty 1

## 2015-08-21 MED ORDER — BENAZEPRIL HCL 20 MG PO TABS
20.0000 mg | ORAL_TABLET | Freq: Two times a day (BID) | ORAL | Status: DC
  Administered 2015-08-21 – 2015-08-24 (×6): 20 mg via ORAL
  Filled 2015-08-21 (×6): qty 1

## 2015-08-21 MED ORDER — PANTOPRAZOLE SODIUM 40 MG PO TBEC
40.0000 mg | DELAYED_RELEASE_TABLET | Freq: Every day | ORAL | Status: DC
  Administered 2015-08-22 – 2015-08-24 (×3): 40 mg via ORAL
  Filled 2015-08-21 (×4): qty 1

## 2015-08-21 MED ORDER — NITROGLYCERIN 0.4 MG SL SUBL: 0.4000 mg | SUBLINGUAL_TABLET | SUBLINGUAL | Status: DC | PRN

## 2015-08-21 MED ORDER — SODIUM CHLORIDE 0.9% FLUSH: 3.0000 mL | INTRAVENOUS | Status: DC | PRN

## 2015-08-21 MED ORDER — VERAPAMIL HCL 2.5 MG/ML IV SOLN
INTRAVENOUS | Status: AC
  Filled 2015-08-21: qty 4

## 2015-08-21 MED ORDER — POLYETHYLENE GLYCOL 3350 17 G PO PACK: 17.0000 g | PACK | Freq: Every day | ORAL | Status: DC | PRN

## 2015-08-21 MED ORDER — SODIUM CHLORIDE 0.9 % IV SOLN: 250.0000 mL | INTRAVENOUS | Status: DC | PRN

## 2015-08-21 MED ORDER — MIDAZOLAM HCL 2 MG/2ML IJ SOLN
INTRAMUSCULAR | Status: AC
  Filled 2015-08-21: qty 2

## 2015-08-21 MED ORDER — FENTANYL CITRATE (PF) 100 MCG/2ML IJ SOLN
INTRAMUSCULAR | Status: AC
  Filled 2015-08-21: qty 2

## 2015-08-21 MED ORDER — HEPARIN SODIUM (PORCINE) 5000 UNIT/ML IJ SOLN
5000.0000 [IU] | Freq: Three times a day (TID) | INTRAMUSCULAR | Status: DC
  Administered 2015-08-22 – 2015-08-23 (×4): 5000 [IU] via SUBCUTANEOUS
  Filled 2015-08-21 (×4): qty 1

## 2015-08-21 MED ORDER — BIVALIRUDIN 250 MG IV SOLR
250.0000 mg | INTRAVENOUS | Status: DC | PRN
  Administered 2015-08-21: 1.75 mg/kg/h via INTRAVENOUS

## 2015-08-21 MED ORDER — IOPAMIDOL (ISOVUE-370) INJECTION 76%
INTRAVENOUS | Status: DC | PRN
  Administered 2015-08-21: 120 mL via INTRA_ARTERIAL

## 2015-08-21 MED ORDER — HEPARIN (PORCINE) IN NACL 2-0.9 UNIT/ML-% IJ SOLN
INTRAMUSCULAR | Status: AC
  Filled 2015-08-21: qty 2000

## 2015-08-21 MED ORDER — OMEGA-3-ACID ETHYL ESTERS 1 G PO CAPS
1.0000 g | ORAL_CAPSULE | Freq: Every day | ORAL | Status: DC
  Administered 2015-08-21 – 2015-08-23 (×3): 1 g via ORAL
  Filled 2015-08-21 (×3): qty 1

## 2015-08-21 MED ORDER — TRIAMTERENE-HCTZ 37.5-25 MG PO CAPS: 1.0000 | ORAL_CAPSULE | ORAL | Status: DC

## 2015-08-21 MED ORDER — TRIAMTERENE-HCTZ 37.5-25 MG PO TABS
1.0000 | ORAL_TABLET | Freq: Every day | ORAL | Status: DC
  Administered 2015-08-22 – 2015-08-24 (×3): 1 via ORAL
  Filled 2015-08-21 (×3): qty 1

## 2015-08-21 MED ORDER — ONDANSETRON HCL 4 MG/2ML IJ SOLN: 4.0000 mg | Freq: Four times a day (QID) | INTRAMUSCULAR | Status: DC | PRN

## 2015-08-21 MED ORDER — MIDAZOLAM HCL 2 MG/2ML IJ SOLN
INTRAMUSCULAR | Status: DC | PRN
  Administered 2015-08-21: 1 mg via INTRAVENOUS
  Administered 2015-08-21: 2 mg via INTRAVENOUS

## 2015-08-21 MED ORDER — LEVOTHYROXINE SODIUM 75 MCG PO TABS
75.0000 ug | ORAL_TABLET | Freq: Every day | ORAL | Status: DC
Start: 2015-08-22 — End: 2015-08-24
  Administered 2015-08-22 – 2015-08-24 (×3): 75 ug via ORAL
  Filled 2015-08-21 (×4): qty 1

## 2015-08-21 MED ORDER — OXYCODONE-ACETAMINOPHEN 5-325 MG PO TABS
1.0000 | ORAL_TABLET | ORAL | Status: DC | PRN
  Administered 2015-08-23: 2 via ORAL
  Filled 2015-08-21: qty 2

## 2015-08-21 MED ORDER — SODIUM CHLORIDE 0.9% FLUSH
3.0000 mL | Freq: Two times a day (BID) | INTRAVENOUS | Status: DC
  Administered 2015-08-21: 3 mL via INTRAVENOUS

## 2015-08-21 MED ORDER — METOPROLOL TARTRATE 12.5 MG HALF TABLET
12.5000 mg | ORAL_TABLET | Freq: Two times a day (BID) | ORAL | Status: DC
  Administered 2015-08-21 – 2015-08-24 (×6): 12.5 mg via ORAL
  Filled 2015-08-21 (×6): qty 1

## 2015-08-21 MED ORDER — VERAPAMIL HCL 2.5 MG/ML IV SOLN
INTRAVENOUS | Status: DC | PRN
  Administered 2015-08-21: 10 mL via INTRA_ARTERIAL

## 2015-08-21 MED ORDER — ATORVASTATIN CALCIUM 80 MG PO TABS
80.0000 mg | ORAL_TABLET | Freq: Every day | ORAL | Status: DC
  Administered 2015-08-21 – 2015-08-23 (×3): 80 mg via ORAL
  Filled 2015-08-21 (×3): qty 1

## 2015-08-21 MED ORDER — TICAGRELOR 90 MG PO TABS
90.0000 mg | ORAL_TABLET | Freq: Two times a day (BID) | ORAL | Status: DC
  Administered 2015-08-21 – 2015-08-24 (×6): 90 mg via ORAL
  Filled 2015-08-21 (×6): qty 1

## 2015-08-21 MED ORDER — IOPAMIDOL (ISOVUE-370) INJECTION 76%
INTRAVENOUS | Status: AC
  Filled 2015-08-21: qty 125

## 2015-08-21 MED ORDER — BIVALIRUDIN BOLUS VIA INFUSION - CUPID
INTRAVENOUS | Status: DC | PRN
  Administered 2015-08-21: 57 mg via INTRAVENOUS

## 2015-08-21 MED ORDER — FENTANYL CITRATE (PF) 100 MCG/2ML IJ SOLN
INTRAMUSCULAR | Status: DC | PRN
  Administered 2015-08-21: 25 ug via INTRAVENOUS
  Administered 2015-08-21: 50 ug via INTRAVENOUS

## 2015-08-21 MED ORDER — LIDOCAINE HCL (PF) 1 % IJ SOLN
INTRAMUSCULAR | Status: DC | PRN
  Administered 2015-08-21: 2 mL

## 2015-08-21 MED ORDER — LIDOCAINE HCL (PF) 1 % IJ SOLN
INTRAMUSCULAR | Status: AC
  Filled 2015-08-21: qty 60

## 2015-08-21 MED ORDER — TICAGRELOR 90 MG PO TABS
ORAL_TABLET | ORAL | Status: DC | PRN
  Administered 2015-08-21: 180 mg via ORAL

## 2015-08-21 SURGICAL SUPPLY — 20 items
BALLN TREK RX 2.5X12 (BALLOONS) ×2
BALLN ~~LOC~~ TREK RX 2.75X12 (BALLOONS) ×2
BALLOON TREK RX 2.5X12 (BALLOONS) ×1 IMPLANT
BALLOON ~~LOC~~ TREK RX 2.75X12 (BALLOONS) ×1 IMPLANT
CATH INFINITI 5FR ANG PIGTAIL (CATHETERS) ×2 IMPLANT
CATH INFINITI JR4 5F (CATHETERS) ×2 IMPLANT
CATH VISTA GUIDE 6FR XBLAD3.5 (CATHETERS) ×2 IMPLANT
DEVICE RAD COMP TR BAND LRG (VASCULAR PRODUCTS) ×2 IMPLANT
ELECT DEFIB PAD ADLT CADENCE (PAD) ×2 IMPLANT
GLIDESHEATH SLEND SS 6F .021 (SHEATH) ×2 IMPLANT
KIT ENCORE 26 ADVANTAGE (KITS) ×2 IMPLANT
KIT HEART LEFT (KITS) ×2 IMPLANT
PACK CARDIAC CATHETERIZATION (CUSTOM PROCEDURE TRAY) ×2 IMPLANT
STENT PROMUS PREM MR 2.5X16 (Permanent Stent) ×2 IMPLANT
SYR MEDRAD MARK V 150ML (SYRINGE) ×2 IMPLANT
TRANSDUCER W/STOPCOCK (MISCELLANEOUS) ×2 IMPLANT
TUBING CIL FLEX 10 FLL-RA (TUBING) ×2 IMPLANT
WIRE COUGAR XT STRL 190CM (WIRE) ×2 IMPLANT
WIRE HI TORQ VERSACORE J 260CM (WIRE) ×2 IMPLANT
WIRE SAFE-T 1.5MM-J .035X260CM (WIRE) ×2 IMPLANT

## 2015-08-21 NOTE — Progress Notes (Signed)
   08/21/15 1600  Clinical Encounter Type  Visited With Patient and family together  Visit Type ED  Referral From Nurse  Consult/Referral To Chaplain  Spiritual Encounters  Spiritual Needs Emotional  Stress Factors  Patient Stress Factors Health changes  Family Stress Factors Health changes   Chaplain responded to page from ED.  Code Stemi.  Patient alert and speaking to healthcare staff upon arrival.  Patient taken to Cath Lab and Chaplain escorted husband to cath lab waiting area.    Gabrielle [name] [facility] 08/21/2015 4:19 PM

## 2015-08-21 NOTE — Progress Notes (Signed)
eLink Physician-Brief Progress Note Patient Name: Gabrielle [name] DOB: August 23, 1945 MRN: [id number]   Date of Service  08/21/2015  HPI/Events of Note  70 yo female admitted with anterolateral STEMI. Now s/p cardiac cath. No cath lab note in EPIC at this time. VSS. Management per Cardiology.  eICU Interventions  Continue current management.      Intervention Category Evaluation Type: New Patient Evaluation  Lysle Dingwall 08/21/2015, 5:27 PM

## 2015-08-21 NOTE — H&P (Signed)
History and Physical  Patient ID: Gabrielle [name] MRN: [id number], SOB: February 25, 1945 70 y.o. Date of Encounter: 08/21/2015, 6:19 PM  Primary Physician: Gennette Pac, MD Primary Cardiologist: new  Chief Complaint: Chest pain  HPI: 70 y.o. female w/ PMHx significant for HTN, hyperlipidemia who presented to [facility] on 08/21/2015 with complaints of chest pain. The patient was at home today in her normal state of health. She was constipated and took a laxative. She went to the toilet and broke out in a sweat. After she moved her bowels, she developed substernal chest discomfort radiating to her shoulders and jaw. She has never had symptoms like this before. She got into bed rest and her pain persisted. She called her husband who subsequently called EMS. On their arrival her EKG demonstrated an inferolateral STEMI and a code STEMI was called. She is evaluated briefly in the emergency room and then brought directly to the cardiac catheterization lab.  The patient has no other associated symptoms except for diaphoresis. She denies nausea, vomiting, or shortness of breath. She has had no lightheadedness or syncope. She has no history of exertional angina. She has no previous history of cardiac disease. She takes medication for hypertension and hyperlipidemia. Denies any other ongoing medical problems. Currently rates her chest pain at 9/10 in intensity.  Past Medical History  Diagnosis Date  . Arthritis 01-17-11    degenerative disc disease-all joints  . Hypertension 01-17-11    tx. meds  . Cholesterol serum elevated 01-17-11    tx. meds  . GERD (gastroesophageal reflux disease) 01-17-11    tx. omeprazole  . Sinus problem 01-17-11    tx. Claritin D  . Heart murmur     slight murmur  . Seasonal allergies   . ST elevation (STEMI) myocardial infarction involving left circumflex coronary artery ([place]) 08/21/2015     Surgical History:  Past Surgical History  Procedure Laterality  Date  . Abdominal hysterectomy  01-17-11    '93-Hysterectomy-heavy bleeding  . Carpal tunnel release  01-17-11    '02-left, also right  . Back surgery  01-17-11    04-20-10-T-Lift lumbar fusion  . Tonsillectomy  01-17-11    child  . Burch procedure  01-17-11    Bladder sling  . 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 Colonoscopy    . Hardware removal Right 05/15/2013    Procedure: REMOVAL RIGHT L4-L5 PEDICLE SCREWS AND ROD ;  Surgeon: Melina Schools, MD;  Location: [facility];  Service: Orthopedics;  Laterality: Right;  . Joint replacement  01-17-11     RTKA' 02  . Laparoscopic appendectomy N/A 08/13/2013    Procedure: APPENDECTOMY LAPAROSCOPIC;  Surgeon: Pedro Earls, MD;  Location: [facility];  Service: General;  Laterality: N/A;     Home Meds: Prior to Admission medications   Medication Sig Start Date End Date Taking? Authorizing Provider  amLODipine (NORVASC) 5 MG tablet Take 5 mg by mouth every morning.     Historical Provider, MD  aspirin EC 81 MG tablet Take 81 mg by mouth at bedtime.     Historical Provider, MD  benazepril (LOTENSIN) 20 MG tablet Take 20 mg by mouth 2 (two) times daily.     Historical Provider, MD  BIOTIN PO Take 1 tablet by mouth 2 (two) times daily.    Historical Provider, MD  Calcium Carbonate-Vitamin  D (CALCIUM 600 + D PO) Take 1 tablet by mouth 2 (two) times daily.      Historical Provider, MD  docusate sodium (COLACE) 100 MG capsule Take 100 mg by mouth every morning.     Historical Provider, MD  fish oil-omega-3 fatty acids 1000 MG capsule Take 1 g by mouth at bedtime.     Historical Provider, MD  glucosamine-chondroitin 500-400 MG tablet Take 2 tablets by mouth daily with breakfast.    Historical Provider, MD  levothyroxine (SYNTHROID, LEVOTHROID) 75 MCG tablet Take 75 mcg by mouth every morning.     Historical Provider, MD    loratadine-pseudoephedrine (CLARITIN-D 24-HOUR) 10-240 MG per 24 hr tablet Take 1 tablet by mouth at bedtime.     Historical Provider, MD  Melatonin 3 MG TABS Take 1 tablet by mouth at bedtime.      Historical Provider, MD  omeprazole (PRILOSEC) 20 MG capsule Take 20 mg by mouth daily with breakfast.     Historical Provider, MD  polyethylene glycol (MIRALAX / GLYCOLAX) packet Take 17 g by mouth daily as needed (for constipation).     Historical Provider, MD  potassium chloride (K-DUR,KLOR-CON) 10 MEQ tablet Take 10 mEq by mouth daily.  11/02/12   Historical Provider, MD  triamcinolone acetonide (KENALOG) 40 MG/ML injection Inject 40 mg into the muscle once.    Historical Provider, MD  triamterene-hydrochlorothiazide (DYAZIDE) 37.5-25 MG per capsule Take 1 capsule by mouth every morning.     Historical Provider, MD    Allergies:  Allergies  Allergen Reactions  . Penicillins Other (See Comments)    Had when was a child   . Pravastatin Other (See Comments)    Messes with Liver     Social History   Social History  . Marital Status: Married    Spouse Name: N/A  . Number of Children: N/A  . Years of Education: N/A   Occupational History  . Not on file.   Social History Main Topics  . Smoking status: Former Smoker -- 1.00 packs/day for 19 years    Types: Cigarettes    Quit date: 08/14/1983  . Smokeless tobacco: Never Used  . Alcohol Use: 1.2 oz/week    2 Glasses of wine per week     Comment: per day  . Drug Use: No  . Sexual Activity: Yes   Other Topics Concern  . Not on file   Social History Narrative     History reviewed. No pertinent family history.  Review of Systems: General: negative for chills, fever, night sweats or weight changes.  ENT: negative for rhinorrhea or epistaxis Cardiovascular: See history of present illness Dermatological: negative for rash Respiratory: negative for cough or wheezing GI: negative for nausea, vomiting, diarrhea, bright red blood  per rectum, melena, or hematemesis. Positive for constipation GU: no hematuria, urgency, or frequency Neurologic: negative for visual changes, syncope, headache, or dizziness Heme: no easy bruising or bleeding Endo: negative for excessive thirst, thyroid disorder, or flushing Musculoskeletal: Positive for arthritis and joint pains All other systems reviewed and are otherwise negative except as noted above.  Physical Exam:  Blood pressure 124/75, pulse 77, temperature 98.1 F (36.7 C), temperature source Oral, resp. rate 24, height 5' (1.524 m), weight 178 lb 9.2 oz (81 kg), SpO2 100 %. General: Well developed, well nourished, alert and oriented, age-appropriate woman in mild acute distress secondary to chest pain. HEENT: Normocephalic, atraumatic, sclera anicteric Neck: Supple. Carotids 2+ without bruits. JVP normal Lungs: Clear bilaterally  to auscultation without wheezes, rales, or rhonchi. Breathing is unlabored. Heart: RRR with normal S1 and S2. No murmurs, rubs, or gallops appreciated. Abdomen: Soft, non-tender, non-distended with normoactive bowel sounds. No hepatomegaly. No rebound/guarding. No obvious abdominal masses. Back: No CVA tenderness Msk:  Strength and tone appear normal for age. Extremities: No clubbing, cyanosis, or edema.  Distal pedal pulses are 2+ and equal bilaterally. Neuro: CNII-XII intact, moves all extremities spontaneously. Psych:  Responds to questions appropriately with a normal affect. Skin: warm and dry without rash   Labs:   Lab Results  Component Value Date   WBC 9.7 08/14/2013   HGB 12.9 08/21/2015   HCT 38.0 08/21/2015   MCV 88.7 08/14/2013   PLT 211 08/14/2013     Recent Labs Lab 08/21/15 1600  NA 137  K 3.4*  CL 101  BUN 30*  CREATININE 1.10*  GLUCOSE 168*   No results for input(s): CKTOTAL, CKMB, TROPONINI in the last 72 hours. No results found for: CHOL, HDL, LDLCALC, TRIG No results found for: DDIMER  Radiology/Studies:  No  results found.   EKG: Normal sinus rhythm with acute inferolateral STEMI pattern  CARDIAC STUDIES: Pending  ASSESSMENT AND PLAN:  1. Acute inferolateral STEMI: We'll give heparin 4000 units and take emergently for cardiac catheterization. She has received aspirin 324 mg orally. Emergency implied consent is obtained. I reviewed the risks, indications, and alternatives to cardiac catheterization and primary PCI with the patient and her husband.  2. Hypertension: Will review her home regimen and adjust accordingly to institute post MI medical therapy  3. Hyperlipidemia: Will review home medical program and make sure that she is taking a high intensity statin drug now with acute coronary syndrome.  Further disposition pending her heart catheterization result.  Deatra James MD 08/21/2015, 6:19 PM

## 2015-08-21 NOTE — ED Notes (Signed)
Pt to ER BIB [facility] from home as STEMI. Pt reports constipation this morning, took medication for this and had a large BM. Shortly after BM, pt began having central chest tightness. Pt called EMS and was found to have ST elevation, code STEMI activated. VSS. Pt was given 324 mg aspirin, 1 nitro, and 5 mg of morphine and is still in "severe" pain on arrival. Dr. Burt Knack at the bedside. Pt placed on zoll with cath lab pads. A/o x4.

## 2015-08-21 NOTE — ED Provider Notes (Signed)
[facility] STEMI code CSN: [id number]     Arrival date & time 08/21/15  1546 History   First MD Initiated Contact with Patient 08/21/15 1559     Chief Complaint  Patient presents with  . Code STEMI     (Consider location/radiation/quality/duration/timing/severity/associated sxs/prior Treatment) HPI Comments: 70 year old female with history of appendectomy, neuropathy, past smoker presents as code STEMI. Patient had chest pain started approximately 2 hours ago with radiation to the right neck. No history of cardiac disease known. Pain is severe currently. Patient given aspirin and pain meds prior to arrival. Fluids bolus given. Nothing has improved the pain.  The history is provided by the patient and the EMS personnel.    Past Medical History  Diagnosis Date  . Arthritis 01-17-11    degenerative disc disease-all joints  . Hypertension 01-17-11    tx. meds  . Cholesterol serum elevated 01-17-11    tx. meds  . GERD (gastroesophageal reflux disease) 01-17-11    tx. omeprazole  . Sinus problem 01-17-11    tx. Claritin D  . Heart murmur     slight murmur  . Seasonal allergies    Past Surgical History  Procedure Laterality Date  . Abdominal hysterectomy  01-17-11    '93-Hysterectomy-heavy bleeding  . Carpal tunnel release  01-17-11    '02-left, also right  . Back surgery  01-17-11    04-20-10-T-Lift lumbar fusion  . Tonsillectomy  01-17-11    child  . Burch procedure  01-17-11    Bladder sling  . 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 Colonoscopy    . Hardware removal Right 05/15/2013    Procedure: REMOVAL RIGHT L4-L5 PEDICLE SCREWS AND ROD ;  Surgeon: Melina Schools, MD;  Location: [facility];  Service: Orthopedics;  Laterality: Right;  . Joint replacement  01-17-11     RTKA' 02  . Laparoscopic appendectomy N/A 08/13/2013    Procedure: APPENDECTOMY  LAPAROSCOPIC;  Surgeon: Pedro Earls, MD;  Location: [facility];  Service: General;  Laterality: N/A;   History reviewed. No pertinent family history. Social History  Substance Use Topics  . Smoking status: Former Smoker -- 1.00 packs/day for 19 years    Types: Cigarettes    Quit date: 08/14/1983  . Smokeless tobacco: Never Used  . Alcohol Use: 1.2 oz/week    2 Glasses of wine per week     Comment: per day   OB History    No data available     Review of Systems  Constitutional: Positive for appetite change. Negative for fever and chills.  HENT: Negative for congestion.   Eyes: Negative for visual disturbance.  Respiratory: Negative for shortness of breath.   Cardiovascular: Positive for chest pain.  Gastrointestinal: Negative for vomiting and abdominal pain.  Genitourinary: Negative for dysuria and flank pain.  Musculoskeletal: Negative for back pain, neck pain and neck stiffness.  Skin: Negative for rash.  Neurological: Negative for light-headedness and headaches.      Allergies  Penicillins and Pravastatin  Home Medications   Prior to Admission medications   Medication Sig Start Date End Date Taking? Authorizing Provider  amLODipine (NORVASC) 5 MG tablet Take 5 mg by mouth every morning.     Historical Provider, MD  aspirin EC 81 MG tablet Take 81 mg by mouth at bedtime.     Historical Provider, MD  benazepril (LOTENSIN)  20 MG tablet Take 20 mg by mouth 2 (two) times daily.     Historical Provider, MD  BIOTIN PO Take 1 tablet by mouth 2 (two) times daily.    Historical Provider, MD  Calcium Carbonate-Vitamin D (CALCIUM 600 + D PO) Take 1 tablet by mouth 2 (two) times daily.      Historical Provider, MD  docusate sodium (COLACE) 100 MG capsule Take 100 mg by mouth every morning.     Historical Provider, MD  fish oil-omega-3 fatty acids 1000 MG capsule Take 1 g by mouth at bedtime.     Historical Provider, MD  glucosamine-chondroitin 500-400 MG tablet Take 2 tablets by  mouth daily with breakfast.    Historical Provider, MD  levothyroxine (SYNTHROID, LEVOTHROID) 75 MCG tablet Take 75 mcg by mouth every morning.     Historical Provider, MD  loratadine-pseudoephedrine (CLARITIN-D 24-HOUR) 10-240 MG per 24 hr tablet Take 1 tablet by mouth at bedtime.     Historical Provider, MD  Melatonin 3 MG TABS Take 1 tablet by mouth at bedtime.      Historical Provider, MD  omeprazole (PRILOSEC) 20 MG capsule Take 20 mg by mouth daily with breakfast.     Historical Provider, MD  polyethylene glycol (MIRALAX / GLYCOLAX) packet Take 17 g by mouth daily as needed (for constipation).     Historical Provider, MD  potassium chloride (K-DUR,KLOR-CON) 10 MEQ tablet Take 10 mEq by mouth daily.  11/02/12   Historical Provider, MD  triamcinolone acetonide (KENALOG) 40 MG/ML injection Inject 40 mg into the muscle once.    Historical Provider, MD  triamterene-hydrochlorothiazide (DYAZIDE) 37.5-25 MG per capsule Take 1 capsule by mouth every morning.     Historical Provider, MD   BP 138/77 mmHg  Pulse 78  SpO2 99% Physical Exam  Constitutional: She is oriented to person, place, and time. She appears well-developed and well-nourished. She appears distressed.  HENT:  Head: Normocephalic and atraumatic.  Eyes: Conjunctivae are normal. Right eye exhibits no discharge. Left eye exhibits no discharge.  Neck: Normal range of motion. Neck supple. No tracheal deviation present.  Cardiovascular: Normal rate and regular rhythm.   Pulmonary/Chest: Effort normal and breath sounds normal.  Abdominal: Soft. She exhibits no distension. There is no tenderness. There is no guarding.  Musculoskeletal: She exhibits no edema.  Neurological: She is alert and oriented to person, place, and time.  Skin: Skin is warm. No rash noted.  Psychiatric: She has a normal mood and affect.  Nursing note and vitals reviewed.   ED Course  Procedures (including critical care time) Labs Review Labs Reviewed  I-STAT  CHEM 8, ED - Abnormal; Notable for the following:    Potassium 3.4 (*)    BUN 30 (*)    Creatinine, Ser 1.10 (*)    Glucose, Bld 168 (*)    All other components within normal limits  I-STAT CG4 LACTIC ACID, ED - Abnormal; Notable for the following:    Lactic Acid, Venous 2.74 (*)    All other components within normal limits  I-STAT TROPOININ, ED    Imaging Review No results found. I have personally reviewed and evaluated these images and lab results as part of my medical decision-making.   EKG Interpretation None      MDM   Final diagnoses:  Acute ST elevation myocardial infarction (STEMI), unspecified artery ([place])   Patient presents with active chest pain and EKG changes consistent with acute myocardial infarction. Reviewed rhythm strips from EMS consistent  with inferior lateral and posterior strain patterns showing elevation. Reviewed EKG performed in the field showing inferior, lateral strain acute elevation. QT normal.  Discussed the case at bedside with Dr. Burt Knack cardiologist. Patient taken to cath lab emergently.  The patients results and plan were reviewed and discussed.   Any x-rays performed were independently reviewed by myself.   Differential diagnosis were considered with the presenting HPI.  Medications  fentaNYL (SUBLIMAZE) injection (25 mcg Intravenous Given 08/21/15 1614)  midazolam (VERSED) injection (2 mg Intravenous Given 08/21/15 1614)  lidocaine (PF) (XYLOCAINE) 1 % injection (2 mLs  Given 08/21/15 1614)  heparin injection (4,000 Units Intravenous Given 08/21/15 1614)  Radial Cocktail/Verapamil only (10 mLs Intra-arterial Given 08/21/15 1615)    Filed Vitals:   08/21/15 1549  BP: 138/77  Pulse: 78  SpO2: 99%    Final diagnoses:  Acute ST elevation myocardial infarction (STEMI), unspecified artery ([place])    Admission/ observation were discussed with the admitting physician, patient and/or family and they are comfortable with the plan.       Elnora Morrison, MD 08/21/15 [phone]

## 2015-08-22 ENCOUNTER — Other Ambulatory Visit (HOSPITAL_COMMUNITY): Payer: Medicare Other

## 2015-08-22 ENCOUNTER — Encounter (HOSPITAL_COMMUNITY): Payer: Self-pay | Admitting: Internal Medicine

## 2015-08-22 DIAGNOSIS — R011 Cardiac murmur, unspecified: Secondary | ICD-10-CM | POA: Diagnosis present

## 2015-08-22 DIAGNOSIS — E785 Hyperlipidemia, unspecified: Secondary | ICD-10-CM | POA: Diagnosis present

## 2015-08-22 LAB — BASIC METABOLIC PANEL
Anion gap: 7 (ref 5–15)
BUN: 17 mg/dL (ref 6–20)
CO2: 24 mmol/L (ref 22–32)
Calcium: 9.2 mg/dL (ref 8.9–10.3)
Chloride: 104 mmol/L (ref 101–111)
Creatinine, Ser: 0.91 mg/dL (ref 0.44–1.00)
GFR calc Af Amer: >60 mL/min (ref >60)
GFR calc non Af Amer: >60 mL/min (ref >60)
Glucose, Bld: 122 mg/dL — ABNORMAL HIGH (ref 65–99)
Potassium: 3.8 mmol/L (ref 3.5–5.1)
Sodium: 135 mmol/L (ref 135–145)

## 2015-08-22 LAB — TROPONIN I
Troponin I: >65 ng/mL (ref <0.03)
Troponin I: >65 ng/mL (ref <0.03)

## 2015-08-22 LAB — CBC
HCT: 35.4 % — ABNORMAL LOW (ref 36.0–46.0)
Hemoglobin: 11.5 g/dL — ABNORMAL LOW (ref 12.0–15.0)
MCH: 29.6 pg (ref 26.0–34.0)
MCHC: 32.5 g/dL (ref 30.0–36.0)
MCV: 91 fL (ref 78.0–100.0)
Platelets: 166 10*3/uL (ref 150–400)
RBC: 3.89 MIL/uL (ref 3.87–5.11)
RDW: 13.3 % (ref 11.5–15.5)
WBC: 9.1 10*3/uL (ref 4.0–10.5)

## 2015-08-22 LAB — LIPID PANEL
Cholesterol: 176 mg/dL (ref 0–200)
HDL: 63 mg/dL (ref >40)
LDL Cholesterol: 82 mg/dL (ref 0–99)
Total CHOL/HDL Ratio: 2.8 RATIO
Triglycerides: 153 mg/dL — ABNORMAL HIGH (ref <150)
VLDL: 31 mg/dL (ref 0–40)

## 2015-08-22 MED ORDER — SODIUM CHLORIDE 0.9% FLUSH
3.0000 mL | Freq: Two times a day (BID) | INTRAVENOUS | Status: DC
  Administered 2015-08-22 – 2015-08-23 (×2): 3 mL via INTRAVENOUS

## 2015-08-22 MED ORDER — ALUM & MAG HYDROXIDE-SIMETH 200-200-20 MG/5ML PO SUSP
ORAL | Status: AC
  Administered 2015-08-22: 30 mL via ORAL
  Filled 2015-08-22: qty 30

## 2015-08-22 MED ORDER — ALUM & MAG HYDROXIDE-SIMETH 200-200-20 MG/5ML PO SUSP
30.0000 mL | ORAL | Status: DC | PRN
  Administered 2015-08-22: 30 mL via ORAL

## 2015-08-22 MED ORDER — SODIUM CHLORIDE 0.9 % WEIGHT BASED INFUSION
1.0000 mL/kg/h | INTRAVENOUS | Status: DC
  Administered 2015-08-22: 1 mL/kg/h via INTRAVENOUS

## 2015-08-22 MED ORDER — SODIUM CHLORIDE 0.9 % IV SOLN: 250.0000 mL | INTRAVENOUS | Status: DC | PRN

## 2015-08-22 MED ORDER — SODIUM CHLORIDE 0.9% FLUSH: 3.0000 mL | INTRAVENOUS | Status: DC | PRN

## 2015-08-22 NOTE — Progress Notes (Signed)
DAILY PROGRESS NOTE  Subjective:  No events overnight. Chest pain has resolved. S/p PCI to OM1. There is residual 70% proximal LCx and 90% mid-RCA disease - Dr. Burt Knack is recommending staged PCI tomorrow. Troponin >65.   Objective:  Temp:  [97.9 F (36.6 C)-98.9 F (37.2 C)] 97.9 F (36.6 C) (07/23 0800) Pulse Rate:  [0-100] 82 (07/23 0346) Resp:  [0-26] 25 (07/23 0346) BP: (110-173)/(59-95) 126/59 (07/23 0346) SpO2:  [0 %-100 %] 98 % (07/23 0346) Weight:  [178 lb 9.2 oz (81 kg)] 178 lb 9.2 oz (81 kg) (07/22 1724) Weight change:   Intake/Output from previous day: 07/22 0701 - 07/23 0700 In: 579.6 [P.O.:240; I.V.:339.6] Out: 150 [Urine:150]  Intake/Output from this shift: No intake/output data recorded.  Medications: Current Facility-Administered Medications  Medication Dose Route Frequency Provider Last Rate Last Dose  . 0.9 %  sodium chloride infusion  250 mL Intravenous PRN Sherren Mocha, MD      . acetaminophen (TYLENOL) tablet 650 mg  650 mg Oral Q4H PRN Sherren Mocha, MD      . alum & mag hydroxide-simeth (MAALOX/MYLANTA) 200-200-20 MG/5ML suspension 30 mL  30 mL Oral Q4H PRN Sherren Mocha, MD   30 mL at 08/22/15 0445  . amLODipine (NORVASC) tablet 5 mg  5 mg Oral q morning - 10a Sherren Mocha, MD      . aspirin EC tablet 81 mg  81 mg Oral Daily Sherren Mocha, MD      . atorvastatin (LIPITOR) tablet 80 mg  80 mg Oral q1800 Sherren Mocha, MD   80 mg at 08/21/15 1858  . benazepril (LOTENSIN) tablet 20 mg  20 mg Oral BID Sherren Mocha, MD   20 mg at 08/21/15 2135  . docusate sodium (COLACE) capsule 100 mg  100 mg Oral q morning - 10a Sherren Mocha, MD      . heparin injection 5,000 Units  5,000 Units Subcutaneous Q8H Sherren Mocha, MD   5,000 Units at 08/22/15 0526  . levothyroxine (SYNTHROID, LEVOTHROID) tablet 75 mcg  75 mcg Oral QAC breakfast Sherren Mocha, MD   75 mcg at 08/22/15 0759  . metoprolol tartrate (LOPRESSOR) tablet 12.5 mg  12.5 mg Oral BID  Sherren Mocha, MD   12.5 mg at 08/21/15 2135  . morphine 2 MG/ML injection 2 mg  2 mg Intravenous Q1H PRN Sherren Mocha, MD      . nitroGLYCERIN (NITROSTAT) SL tablet 0.4 mg  0.4 mg Sublingual Q5 Min x 3 PRN Sherren Mocha, MD      . omega-3 acid ethyl esters (LOVAZA) capsule 1 g  1 g Oral QHS Sherren Mocha, MD   1 g at 08/21/15 2135  . ondansetron (ZOFRAN) injection 4 mg  4 mg Intravenous Q6H PRN Sherren Mocha, MD      . oxyCODONE-acetaminophen (PERCOCET/ROXICET) 5-325 MG per tablet 1-2 tablet  1-2 tablet Oral Q4H PRN Sherren Mocha, MD      . pantoprazole (PROTONIX) EC tablet 40 mg  40 mg Oral Daily Sherren Mocha, MD      . polyethylene glycol (MIRALAX / GLYCOLAX) packet 17 g  17 g Oral Daily PRN Sherren Mocha, MD      . potassium chloride (K-DUR,KLOR-CON) CR tablet 10 mEq  10 mEq Oral Daily Sherren Mocha, MD      . sodium chloride flush (NS) 0.9 % injection 3 mL  3 mL Intravenous Q12H Sherren Mocha, MD   3 mL at 08/21/15 2115  . sodium chloride flush (NS) 0.9 %  injection 3 mL  3 mL Intravenous PRN Sherren Mocha, MD      . ticagrelor [facility]) tablet 90 mg  90 mg Oral BID Sherren Mocha, MD   90 mg at 08/21/15 2247  . triamterene-hydrochlorothiazide (MAXZIDE-25) 37.5-25 MG per tablet 1 tablet  1 tablet Oral Daily Sherren Mocha, MD        Physical Exam: General appearance: alert and no distress Neck: no carotid bruit and no JVD Lungs: clear to auscultation bilaterally Heart: regular rate and rhythm, S1, S2 normal, no murmur, click, rub or gallop Abdomen: soft, non-tender; bowel sounds normal; no masses,  no organomegaly Extremities: extremities normal, atraumatic, no cyanosis or edema and right radial cath site intact Pulses: 2+ and symmetric Skin: Skin color, texture, turgor normal. No rashes or lesions Neurologic: Grossly normal Psych: Pleasant  Lab Results: Results for orders placed or performed during the hospital encounter of 08/21/15 (from the past 48 hour(s))  I-stat  chem 8, ed     Status: Abnormal   Collection Time: 08/21/15  4:00 PM  Result Value Ref Range   Sodium 137 135 - 145 mmol/L   Potassium 3.4 (L) 3.5 - 5.1 mmol/L   Chloride 101 101 - 111 mmol/L   BUN 30 (H) 6 - 20 mg/dL   Creatinine, Ser 1.10 (H) 0.44 - 1.00 mg/dL   Glucose, Bld 168 (H) 65 - 99 mg/dL   Calcium, Ion 1.16 1.12 - 1.23 mmol/L   TCO2 22 0 - 100 mmol/L   Hemoglobin 12.9 12.0 - 15.0 g/dL   HCT 38.0 36.0 - 46.0 %  I-stat troponin, ED     Status: None   Collection Time: 08/21/15  4:00 PM  Result Value Ref Range   Troponin i, poc 0.06 0.00 - 0.08 ng/mL   Comment 3            Comment: Due to the release kinetics of cTnI, a negative result within the first hours of the onset of symptoms does not rule out myocardial infarction with certainty. If myocardial infarction is still suspected, repeat the test at appropriate intervals.   I-Stat CG4 Lactic Acid, ED     Status: Abnormal   Collection Time: 08/21/15  4:02 PM  Result Value Ref Range   Lactic Acid, Venous 2.74 (HH) 0.5 - 1.9 mmol/L   Comment NOTIFIED PHYSICIAN   MRSA PCR Screening     Status: None   Collection Time: 08/21/15  5:15 PM  Result Value Ref Range   MRSA by PCR NEGATIVE NEGATIVE    Comment:        The GeneXpert MRSA Assay (FDA approved for NASAL specimens only), is one component of a comprehensive MRSA colonization surveillance program. It is not intended to diagnose MRSA infection nor to guide or monitor treatment for MRSA infections.   CBC     Status: Abnormal   Collection Time: 08/21/15  5:50 PM  Result Value Ref Range   WBC 15.0 (H) 4.0 - 10.5 K/uL   RBC 4.12 3.87 - 5.11 MIL/uL   Hemoglobin 12.5 12.0 - 15.0 g/dL   HCT 37.7 36.0 - 46.0 %   MCV 91.5 78.0 - 100.0 fL   MCH 30.3 26.0 - 34.0 pg   MCHC 33.2 30.0 - 36.0 g/dL   RDW 13.1 11.5 - 15.5 %   Platelets 188 150 - 400 K/uL  Creatinine, serum     Status: Abnormal   Collection Time: 08/21/15  5:50 PM  Result Value Ref Range  Creatinine, Ser  1.15 (H) 0.44 - 1.00 mg/dL   GFR calc non Af Amer 47 (L) >60 mL/min   GFR calc Af Amer 55 (L) >60 mL/min    Comment: (NOTE) The eGFR has been calculated using the CKD EPI equation. This calculation has not been validated in all clinical situations. eGFR's persistently <60 mL/min signify possible Chronic Kidney Disease.   Troponin I     Status: Abnormal   Collection Time: 08/21/15  5:50 PM  Result Value Ref Range   Troponin I 26.21 (HH) <0.03 ng/mL    Comment: CRITICAL RESULT CALLED TO, READ BACK BY AND VERIFIED WITH: STOWE,H RN 08/21/15 1926 WOOTEN,K   TSH     Status: None   Collection Time: 08/21/15  5:50 PM  Result Value Ref Range   TSH 1.982 0.350 - 4.500 uIU/mL  Brain natriuretic peptide     Status: None   Collection Time: 08/21/15  5:50 PM  Result Value Ref Range   B Natriuretic Peptide 27.7 0.0 - 100.0 pg/mL  Troponin I     Status: Abnormal   Collection Time: 08/21/15 11:25 PM  Result Value Ref Range   Troponin I >65.00 (HH) <0.03 ng/mL    Comment: CRITICAL VALUE NOTED.  VALUE IS CONSISTENT WITH PREVIOUSLY REPORTED AND CALLED VALUE.  Troponin I     Status: Abnormal   Collection Time: 08/22/15  4:58 AM  Result Value Ref Range   Troponin I >65.00 (HH) <0.03 ng/mL    Comment: CRITICAL VALUE NOTED.  VALUE IS CONSISTENT WITH PREVIOUSLY REPORTED AND CALLED VALUE.  Basic metabolic panel     Status: Abnormal   Collection Time: 08/22/15  4:58 AM  Result Value Ref Range   Sodium 135 135 - 145 mmol/L   Potassium 3.8 3.5 - 5.1 mmol/L   Chloride 104 101 - 111 mmol/L   CO2 24 22 - 32 mmol/L   Glucose, Bld 122 (H) 65 - 99 mg/dL   BUN 17 6 - 20 mg/dL   Creatinine, Ser 0.91 0.44 - 1.00 mg/dL   Calcium 9.2 8.9 - 10.3 mg/dL   GFR calc non Af Amer >60 >60 mL/min   GFR calc Af Amer >60 >60 mL/min    Comment: (NOTE) The eGFR has been calculated using the CKD EPI equation. This calculation has not been validated in all clinical situations. eGFR's persistently <60 mL/min signify  possible Chronic Kidney Disease.    Anion gap 7 5 - 15  CBC     Status: Abnormal   Collection Time: 08/22/15  4:58 AM  Result Value Ref Range   WBC 9.1 4.0 - 10.5 K/uL   RBC 3.89 3.87 - 5.11 MIL/uL   Hemoglobin 11.5 (L) 12.0 - 15.0 g/dL   HCT 35.4 (L) 36.0 - 46.0 %   MCV 91.0 78.0 - 100.0 fL   MCH 29.6 26.0 - 34.0 pg   MCHC 32.5 30.0 - 36.0 g/dL   RDW 13.3 11.5 - 15.5 %   Platelets 166 150 - 400 K/uL  Lipid panel     Status: Abnormal   Collection Time: 08/22/15  4:58 AM  Result Value Ref Range   Cholesterol 176 0 - 200 mg/dL   Triglycerides 153 (H) <150 mg/dL   HDL 63 >40 mg/dL   Total CHOL/HDL Ratio 2.8 RATIO   VLDL 31 0 - 40 mg/dL   LDL Cholesterol 82 0 - 99 mg/dL    Comment:        Total Cholesterol/HDL:CHD Risk Coronary  Heart Disease Risk Table                     Men   Women  1/2 Average Risk   3.4   3.3  Average Risk       5.0   4.4  2 X Average Risk   9.6   7.1  3 X Average Risk  23.4   11.0        Use the calculated Patient Ratio above and the CHD Risk Table to determine the patient's CHD Risk.        ATP III CLASSIFICATION (LDL):  <100     mg/dL   Optimal  100-129  mg/dL   Near or Above                    Optimal  130-159  mg/dL   Borderline  160-189  mg/dL   High  >190     mg/dL   Very High     Imaging: Portable Chest X-ray 1 View  Result Date: 08/21/2015 CLINICAL DATA:  Chest pain EXAM: PORTABLE CHEST 1 VIEW COMPARISON:  May 13, 2013 FINDINGS: The heart size and mediastinal contours are within normal limits. Both lungs are clear. The visualized skeletal structures are unremarkable. IMPRESSION: No active disease. Electronically Signed   By: Dorise Bullion III M.D   On: 08/21/2015 18:50    Assessment:  Principal Problem:   ST elevation (STEMI) myocardial infarction involving left circumflex coronary artery [facility]) Active Problems:   Dyslipidemia   Hypertension   Heart murmur   Plan:  1. Feels much better today. Plan echo today. Keep NPO p MN  for staged PCI tomorrow. On appropriate meds (ASA, Brillinta, lipitor, b-blocker).  Time Spent Directly with Patient:  30 minutes  Length of Stay:  LOS: 1 day   Pixie Casino, MD, [facility] Attending Cardiologist [place] 08/22/2015, 9:03 AM

## 2015-08-22 NOTE — Progress Notes (Signed)
Utilization review completed.  

## 2015-08-23 ENCOUNTER — Encounter (HOSPITAL_COMMUNITY): Payer: Self-pay | Admitting: Cardiovascular Disease

## 2015-08-23 ENCOUNTER — Encounter (HOSPITAL_COMMUNITY)
Admission: EM | Disposition: A | Discharge status: Home / Self Care | Payer: Self-pay | Source: Home / Self Care | Attending: Cardiovascular Disease

## 2015-08-23 ENCOUNTER — Inpatient Hospital Stay (HOSPITAL_COMMUNITY): Payer: Medicare Other

## 2015-08-23 DIAGNOSIS — R079 Chest pain, unspecified: Secondary | ICD-10-CM

## 2015-08-23 DIAGNOSIS — I25118 Atherosclerotic heart disease of native coronary artery with other forms of angina pectoris: Secondary | ICD-10-CM

## 2015-08-23 DIAGNOSIS — I2510 Atherosclerotic heart disease of native coronary artery without angina pectoris: Secondary | ICD-10-CM

## 2015-08-23 HISTORY — PX: CARDIAC CATHETERIZATION: SHX172

## 2015-08-23 LAB — GLUCOSE, CAPILLARY: Glucose-Capillary: 100 mg/dL — ABNORMAL HIGH (ref 65–99)

## 2015-08-23 LAB — ECHOCARDIOGRAM COMPLETE
Height: 60 in
Weight: 2857.16 oz

## 2015-08-23 LAB — BASIC METABOLIC PANEL
Anion gap: 7 (ref 5–15)
BUN: 12 mg/dL (ref 6–20)
CO2: 24 mmol/L (ref 22–32)
Calcium: 9.4 mg/dL (ref 8.9–10.3)
Chloride: 106 mmol/L (ref 101–111)
Creatinine, Ser: 0.77 mg/dL (ref 0.44–1.00)
GFR calc Af Amer: >60 mL/min (ref >60)
GFR calc non Af Amer: >60 mL/min (ref >60)
Glucose, Bld: 113 mg/dL — ABNORMAL HIGH (ref 65–99)
Potassium: 4 mmol/L (ref 3.5–5.1)
Sodium: 137 mmol/L (ref 135–145)

## 2015-08-23 LAB — POCT ACTIVATED CLOTTING TIME: Activated Clotting Time: 290 seconds

## 2015-08-23 SURGERY — CORONARY STENT INTERVENTION
Anesthesia: LOCAL

## 2015-08-23 MED ORDER — HEPARIN SODIUM (PORCINE) 1000 UNIT/ML IJ SOLN
INTRAMUSCULAR | Status: DC | PRN
  Administered 2015-08-23: 5700 [IU] via INTRAVENOUS

## 2015-08-23 MED ORDER — LIDOCAINE HCL (PF) 1 % IJ SOLN
INTRAMUSCULAR | Status: AC
  Filled 2015-08-23: qty 30

## 2015-08-23 MED ORDER — MIDAZOLAM HCL 2 MG/2ML IJ SOLN
INTRAMUSCULAR | Status: AC
  Filled 2015-08-23: qty 2

## 2015-08-23 MED ORDER — IOPAMIDOL (ISOVUE-370) INJECTION 76%
INTRAVENOUS | Status: AC
  Filled 2015-08-23: qty 125

## 2015-08-23 MED ORDER — FENTANYL CITRATE (PF) 100 MCG/2ML IJ SOLN
INTRAMUSCULAR | Status: AC
  Filled 2015-08-23: qty 2

## 2015-08-23 MED ORDER — HEART ATTACK BOUNCING BOOK
Freq: Once | Status: AC
  Administered 2015-08-23: 22:00:00
  Filled 2015-08-23: qty 1

## 2015-08-23 MED ORDER — SODIUM CHLORIDE 0.9% FLUSH
3.0000 mL | Freq: Two times a day (BID) | INTRAVENOUS | Status: DC
  Administered 2015-08-23 – 2015-08-24 (×2): 3 mL via INTRAVENOUS

## 2015-08-23 MED ORDER — HEPARIN (PORCINE) IN NACL 2-0.9 UNIT/ML-% IJ SOLN
INTRAMUSCULAR | Status: DC | PRN
  Administered 2015-08-23: 1000 mL

## 2015-08-23 MED ORDER — IOPAMIDOL (ISOVUE-370) INJECTION 76%
INTRAVENOUS | Status: DC | PRN
  Administered 2015-08-23: 55 mL

## 2015-08-23 MED ORDER — FENTANYL CITRATE (PF) 100 MCG/2ML IJ SOLN
INTRAMUSCULAR | Status: DC | PRN
  Administered 2015-08-23: 25 ug via INTRAVENOUS

## 2015-08-23 MED ORDER — HEPARIN (PORCINE) IN NACL 2-0.9 UNIT/ML-% IJ SOLN
INTRAMUSCULAR | Status: AC
  Filled 2015-08-23: qty 1000

## 2015-08-23 MED ORDER — VERAPAMIL HCL 2.5 MG/ML IV SOLN
INTRAVENOUS | Status: DC | PRN
  Administered 2015-08-23: 10 mL via INTRA_ARTERIAL

## 2015-08-23 MED ORDER — HEPARIN SODIUM (PORCINE) 1000 UNIT/ML IJ SOLN
INTRAMUSCULAR | Status: AC
  Filled 2015-08-23: qty 1

## 2015-08-23 MED ORDER — ANGIOPLASTY BOOK
Freq: Once | Status: AC
  Administered 2015-08-23: 22:00:00
  Filled 2015-08-23: qty 1

## 2015-08-23 MED ORDER — SODIUM CHLORIDE 0.9 % IV SOLN: 250.0000 mL | INTRAVENOUS | Status: DC | PRN

## 2015-08-23 MED ORDER — SODIUM CHLORIDE 0.9 % WEIGHT BASED INFUSION
1.0000 mL/kg/h | INTRAVENOUS | Status: AC
  Administered 2015-08-23: 1 mL/kg/h via INTRAVENOUS

## 2015-08-23 MED ORDER — MIDAZOLAM HCL 2 MG/2ML IJ SOLN
INTRAMUSCULAR | Status: DC | PRN
  Administered 2015-08-23: 1 mg via INTRAVENOUS

## 2015-08-23 MED ORDER — SODIUM CHLORIDE 0.9% FLUSH: 3.0000 mL | INTRAVENOUS | Status: DC | PRN

## 2015-08-23 MED ORDER — VERAPAMIL HCL 2.5 MG/ML IV SOLN
INTRAVENOUS | Status: AC
  Filled 2015-08-23: qty 2

## 2015-08-23 MED ORDER — LIDOCAINE HCL (PF) 1 % IJ SOLN
INTRAMUSCULAR | Status: DC | PRN
  Administered 2015-08-23: 2 mL via SUBCUTANEOUS

## 2015-08-23 SURGICAL SUPPLY — 14 items
BALLN EMERGE MR 2.25X12 (BALLOONS) ×2
BALLOON EMERGE MR 2.25X12 (BALLOONS) ×1 IMPLANT
CATH OPTITORQUE TIG 4.0 5F (CATHETERS) ×2 IMPLANT
CATH VISTA GUIDE 6FR JR4 (CATHETERS) ×2 IMPLANT
DEVICE RAD COMP TR BAND LRG (VASCULAR PRODUCTS) ×2 IMPLANT
GLIDESHEATH SLEND A-KIT 6F 22G (SHEATH) ×2 IMPLANT
KIT ENCORE 26 ADVANTAGE (KITS) ×4 IMPLANT
KIT HEART LEFT (KITS) ×2 IMPLANT
PACK CARDIAC CATHETERIZATION (CUSTOM PROCEDURE TRAY) ×2 IMPLANT
STENT PROMUS PREM MR 2.5X16 (Permanent Stent) ×2 IMPLANT
TRANSDUCER W/STOPCOCK (MISCELLANEOUS) ×2 IMPLANT
TUBING CIL FLEX 10 FLL-RA (TUBING) ×2 IMPLANT
WIRE HI TORQ BMW 190CM (WIRE) ×2 IMPLANT
WIRE SAFE-T 1.5MM-J .035X260CM (WIRE) ×2 IMPLANT

## 2015-08-23 NOTE — Care Management Note (Signed)
Case Management Note  Patient Details  Name: Gabrielle [name] MRN: [id number] Date of Birth: 1945/09/19  Subjective/Objective:    Adm w mi                Action/Plan:lives w fam, pcp dr little   Expected Discharge Date:                  Expected Discharge Plan:  Home/Self Care  In-House Referral:     Discharge planning Services  CM Consult, Medication Assistance  Post Acute Care Choice:    Choice offered to:     DME Arranged:    DME Agency:     HH Arranged:    HH Agency:     Status of Service:     If discussed at [employer], dates discussed:    Additional Comments: gave pt 30day free brilinta card. Has medicare ins  Percell Locus 08/23/2015, 10:42 AM

## 2015-08-23 NOTE — Progress Notes (Signed)
*  PRELIMINARY RESULTS* Echocardiogram 2D Echocardiogram has been performed.  Leavy Cella 08/23/2015, 11:01 AM

## 2015-08-23 NOTE — Care Management Important Message (Signed)
Important Message  Patient Details  Name: Gabrielle [name] MRN: [id number] Date of Birth: Aug 17, 1945   Medicare Important Message Given:  Yes    Loann Quill 08/23/2015, 8:40 AM

## 2015-08-23 NOTE — Progress Notes (Signed)
   The patient is for PCI today.  Daryel November, MD

## 2015-08-23 NOTE — Progress Notes (Signed)
Patient admitted to unit, assessed at 69. Right radial site with hematoma noted proximal to site. Rubie Maid, cath lab staff was holding pressure. Reported that patient had hematoma and swelling extending to approximately 1 inch distal to antecubital site and that area now soft after pressure held. Slight swelling and pink color noted in this area, soft to palpation. Pressure held over hematoma proximal to radial site for 5 minutes, then coban dressing applied. Site stable. 1730 Patient complained of increased pain in hand and upper forearm. Pressure held to forearm and Dr. Ellyn Hack notified. Dr. Ellyn Hack into see patient and examine site. Pressure held, coban applied to upper forearm. Dr. Ellyn Hack deflated air from band. TR band applied proximal to existing TR band and 4 mls of air inflated as instructed by Dr. Ellyn Hack. Dr. Ellyn Hack into reexamine site and deflated additional air from band. Reported total of 8 mls removed from original band. Ice applied. 1800 site stable.

## 2015-08-23 NOTE — H&P (View-Only) (Signed)
   The patient is for PCI today.  Daryel November, MD

## 2015-08-23 NOTE — Interval H&P Note (Signed)
History and Physical Interval Note:  08/23/2015 3:35 PM  Gabrielle [name]  has presented today for surgery, with the diagnosis of this visit plan PCI significant RCA CAD.  The various methods of treatment have been discussed with the patient and family. After consideration of risks, benefits and other options for treatment, the patient has consented to  Procedure(s): Coronary Stent Intervention (N/A) as a surgical intervention .  The patient's history has been reviewed, patient examined, no change in status, stable for surgery.  I have reviewed the patient's chart and labs.  Questions were answered to the patient's satisfaction.     Glenetta Hew

## 2015-08-23 NOTE — Progress Notes (Signed)
Pt  With small bleed in arm.  Will hold subcu heparin and order SCDs.Marland Kitchen

## 2015-08-24 ENCOUNTER — Encounter (HOSPITAL_COMMUNITY): Payer: Self-pay | Admitting: Cardiology

## 2015-08-24 ENCOUNTER — Telehealth: Payer: Self-pay | Admitting: Cardiovascular Disease

## 2015-08-24 DIAGNOSIS — S40029A Contusion of unspecified upper arm, initial encounter: Secondary | ICD-10-CM

## 2015-08-24 LAB — BASIC METABOLIC PANEL
Anion gap: 8 (ref 5–15)
BUN: 12 mg/dL (ref 6–20)
CO2: 23 mmol/L (ref 22–32)
Calcium: 9.5 mg/dL (ref 8.9–10.3)
Chloride: 104 mmol/L (ref 101–111)
Creatinine, Ser: 0.82 mg/dL (ref 0.44–1.00)
GFR calc Af Amer: >60 mL/min (ref >60)
GFR calc non Af Amer: >60 mL/min (ref >60)
Glucose, Bld: 116 mg/dL — ABNORMAL HIGH (ref 65–99)
Potassium: 3.9 mmol/L (ref 3.5–5.1)
Sodium: 135 mmol/L (ref 135–145)

## 2015-08-24 LAB — CBC
HCT: 35.9 % — ABNORMAL LOW (ref 36.0–46.0)
Hemoglobin: 11.6 g/dL — ABNORMAL LOW (ref 12.0–15.0)
MCH: 29.7 pg (ref 26.0–34.0)
MCHC: 32.3 g/dL (ref 30.0–36.0)
MCV: 91.8 fL (ref 78.0–100.0)
Platelets: 175 10*3/uL (ref 150–400)
RBC: 3.91 MIL/uL (ref 3.87–5.11)
RDW: 13.3 % (ref 11.5–15.5)
WBC: 8.8 10*3/uL (ref 4.0–10.5)

## 2015-08-24 LAB — POCT ACTIVATED CLOTTING TIME: Activated Clotting Time: 368 seconds

## 2015-08-24 MED ORDER — ATORVASTATIN CALCIUM 80 MG PO TABS: 80.0000 mg | ORAL_TABLET | Freq: Every day | ORAL | 12 refills | Status: DC

## 2015-08-24 MED ORDER — TICAGRELOR 90 MG PO TABS: 90.0000 mg | ORAL_TABLET | Freq: Two times a day (BID) | ORAL | 12 refills | Status: DC

## 2015-08-24 MED ORDER — ROSUVASTATIN CALCIUM 20 MG PO TABS: 20.0000 mg | ORAL_TABLET | Freq: Every day | ORAL | 12 refills | Status: DC

## 2015-08-24 MED ORDER — TICAGRELOR 90 MG PO TABS: 90.0000 mg | ORAL_TABLET | Freq: Two times a day (BID) | ORAL | 0 refills | Status: DC

## 2015-08-24 MED ORDER — NITROGLYCERIN 0.4 MG SL SUBL: 0.4000 mg | SUBLINGUAL_TABLET | SUBLINGUAL | 2 refills | Status: DC | PRN

## 2015-08-24 MED ORDER — METOPROLOL TARTRATE 25 MG PO TABS: 12.5000 mg | ORAL_TABLET | Freq: Two times a day (BID) | ORAL | 12 refills | Status: DC

## 2015-08-24 MED FILL — BRILINTA 90 MG TABLET: 90 | 30 days supply | Qty: 60 | Fill #0

## 2015-08-24 NOTE — Discharge Summary (Addendum)
Discharge Summary    Patient ID: Gabrielle [name],  MRN: [id number], DOB/AGE: 70-15-1947 70 y.o.  Admit date: 08/21/2015 Discharge date: 08/24/2015  Primary Care Provider: Gennette Pac Primary Cardiologist: Dr. Burt Knack  Discharge Diagnoses    Principal Problem:   ST elevation (STEMI) myocardial infarction involving left circumflex coronary artery [facility]) Active Problems:   Dyslipidemia   Hypertension   Heart murmur   Coronary artery disease involving native coronary artery   Hematoma of arm   Hematoma in the area post right radial artery catheterization. Allergies Allergies  Allergen Reactions  . Penicillins Other (See Comments)    Allergy as an infant - no other information available   . Pravastatin Other (See Comments)    Effects liver function    Diagnostic Studies/Procedures   Coronary Stent Intervention 08/23/15  Left Heart Cath and Coronary Angiography    Mid RCA lesion, 85 %stenosed.  A STENT PROMUS PREM MR 2.5X16 drug eluting stent was successfully placed. Post intervention, there is a 0% residual stenosis.  1st Mrg stent appears to be widely patent  Ost Cx to Prox Cx lesion, 70 %stenosed.    Successful focal PCI of mid RCA lesion with DES stent. Reviewed with Dr. Burt Knack, we both feel that the osteo-proximal circumflex lesion is a very difficult lesion to consider PCI as there is really minimal landing zone for stent.  This also somewhat calcified and likely chronic. The patient had not been having anginal symptoms prior to coming in, therefore it is unlikely to be flow-limiting. Plan for now is medical management.   Plan: Transfer to 6 central post procedure unit for TR band removal. Continue dual antiplatelet therapy. Continue aggressive risk factor modification.  Expected discharge tomorrow. Will need follow-up with Dr. Burt Knack  Coronary Stent Intervention 08/21/15  Left Heart Cath and Coronary Angiography    Mid RCA lesion, 85%  stenosed.  Ost LM lesion, 40% stenosed.  Prox LAD to Mid LAD lesion, 25% stenosed.  Ost Cx to Prox Cx lesion, 70% stenosed.  There is mild left ventricular systolic dysfunction.  1st Mrg lesion, 100% stenosed. Post intervention, there is a 0% residual stenosis.   1. Acute inferolateral STEMI secondary to total occlusion of the first OM branch. Is is treated successfully with primary PCI.  2. Mild nonobstructive disease of the left main and LAD  3. Severe stenosis of the mid RCA: Recommend staged PCI prior to discharge  4. Mild segmental contraction abnormality the left ventricle with preserved overall LVEF  Recommendations: Aspirin and brilinta 12 months, aggressive medical therapy, staged PCI the right coronary artery on Monday.   Indications   ST elevation myocardial infarction involving left circumflex coronary artery (HCC) [I21.21 (ICD-10-CM)]    Transthoracic Echocardiography 08/23/15 Study Conclusions  - Left ventricle: The cavity size was normal. Systolic function was   normal. The estimated ejection fraction was in the range of 55%   to 60%. Wall motion was normal; there were no regional wall   motion abnormalities. Left ventricular diastolic function   parameters were normal. - Atrial septum: No defect or patent foramen ovale was identified  _____________   History of Present Illness   Gabrielle [name] is a  70 y.o. female w/ PMHx significant for HTN, hyperlipidemia who presented to [facility] on 08/21/2015 with complaints of chest pain. The patient was at home in her normal state of health. She was constipated and took a laxative. She went to the toilet and broke out in  a sweat. After she moved her bowels, she developed substernal chest discomfort radiating to her shoulders and jaw. She has never had symptoms like this before. She got into bed rest and her pain persisted. She called her husband who subsequently called EMS. On their arrival her EKG  demonstrated an inferolateral STEMI and a code STEMI was called. She is evaluated briefly in the emergency room and then brought directly to the cardiac catheterization lab.  The patient had no other associated symptoms except for diaphoresis. She denied nausea, vomiting, or shortness of breath. She has had no lightheadedness or syncope. She has no history of exertional angina. She has no previous history of cardiac disease. She takes medication for hypertension and hyperlipidemia. Denies any other ongoing medical problems.  Hospital Course  She was brought urgently to the cath lab on 08/21/15, report above. She had total occlusion of first OM branch that was successfully treated with DES. She also had 85% stenosis in her mid RCA, which she returned to the cath lab for stage PCI on 08/23/15. That mid RCA lesion was successfully treated with DES. There is some residual disease in her circumflex that has chronic calcification and minimal landing zone for PCI. This will be treated medically.   She will need to be on dual anti-platelet therapy for one year with ASA and Brilinta. Her right radial site is stable, there is a level one hematoma. She has +2 bilateral radial pulses. The hematoma was treated very carefully on the day of catheterization. It is stable today.  She will continue high intensity statin and beta blocker. She has previously had elevated LFT's with pravastatin, she was on Crestor outpatient, we will increase this to 20mg . We will obtain hepatic function panel in 6 weeks.  We will continue her ACE-I and amlodipine. She is also on triamterene and HCTZ, continue same. Will obtain BMP at follow up appt. Her creatinine is within normal limits.   Echo shows preserved EF of 55-60%. We will follow up with her in the next 7-10 days.  _____________  Discharge Vitals Blood pressure (!) 124/56, pulse 83, temperature 97.7 F (36.5 C), temperature source Axillary, resp. rate (!) 22, height 5' (1.524  m), weight 170 lb 10.2 oz (77.4 kg), SpO2 99 %.  Filed Weights   08/21/15 1724 08/24/15 0609  Weight: 178 lb 9.2 oz (81 kg) 170 lb 10.2 oz (77.4 kg)    Labs & Radiologic Studies     CBC  Recent Labs  08/22/15 0458 08/24/15 0305  WBC 9.1 8.8  HGB 11.5* 11.6*  HCT 35.4* 35.9*  MCV 91.0 91.8  PLT 166 0000000   Basic Metabolic Panel  Recent Labs  08/23/15 0913 08/24/15 0305  NA 137 135  K 4.0 3.9  CL 106 104  CO2 24 23  GLUCOSE 113* 116*  BUN 12 12  CREATININE 0.77 0.82  CALCIUM 9.4 9.5   Cardiac Enzymes  Recent Labs  08/21/15 1750 08/21/15 2325 08/22/15 0458  TROPONINI 26.21* >65.00* >65.00*   Fasting Lipid Panel  Recent Labs  08/22/15 0458  CHOL 176  HDL 63  LDLCALC 82  TRIG 153*  CHOLHDL 2.8   Thyroid Function Tests  Recent Labs  08/21/15 1750  TSH 1.982    Portable Chest X-ray 1 View  Result Date: 08/21/2015 CLINICAL DATA:  Chest pain EXAM: PORTABLE CHEST 1 VIEW COMPARISON:  May 13, 2013 FINDINGS: The heart size and mediastinal contours are within normal limits. Both lungs are clear. The visualized  skeletal structures are unremarkable. IMPRESSION: No active disease. Electronically Signed   By: Dorise Bullion III M.D   On: 08/21/2015 18:50    Disposition   Pt is being discharged home today in good condition.  Follow-up Plans & Appointments    Follow-up Information    Lyda Jester, PA-C Follow up on 08/31/2015.   Specialties:  Cardiology, Radiology Why:  At 9:30 for hospital follow up  Contact information: [place] [address] [phone]          Discharge Instructions    Amb Referral to Cardiac Rehabilitation    Complete by:  As directed   Diagnosis:   STEMI PTCA Coronary Stents     Diet - low sodium heart healthy    Complete by:  As directed   Discharge instructions    Complete by:  As directed   NO HEAVY LIFTING OR SEXUAL ACTIVITY for 7 DAYS. NO DRIVING for 3-5 DAYS. NO SOAKING BATHS, HOT  TUBS, POOLS, ETC., for 7 DAYS  Radial Site Care Refer to this sheet in the next few weeks. These instructions provide you with information on caring for yourself after your procedure. Your caregiver may also give you more specific instructions. Your treatment has been planned according to current medical practices, but problems sometimes occur. Call your caregiver if you have any problems or questions after your procedure. HOME CARE INSTRUCTIONS You may shower the day after the procedure.Remove the bandage (dressing) and gently wash the site with plain soap and water.Gently pat the site dry.  Do not apply powder or lotion to the site.  Do not submerge the affected site in water for 3 to 5 days.  Inspect the site at least twice daily.  Do not flex or bend the affected arm for 24 hours.  No lifting over 5 pounds (2.3 kg) for 5 days after your procedure.  Do not drive home if you are discharged the same day of the procedure. Have someone else drive you.  You may drive 24 hours after the procedure unless otherwise instructed by your caregiver.  What to expect: Any bruising will usually fade within 1 to 2 weeks.  Blood that collects in the tissue (hematoma) may be painful to the touch. It should usually decrease in size and tenderness within 1 to 2 weeks.  SEEK IMMEDIATE MEDICAL CARE IF: You have unusual pain at the radial site.  You have redness, warmth, swelling, or pain at the radial site.  You have drainage (other than a small amount of blood on the dressing).  You have chills.  You have a fever or persistent symptoms for more than 72 hours.  You have a fever and your symptoms suddenly get worse.  Your arm becomes pale, cool, tingly, or numb.  You have heavy bleeding from the site. Hold pressure on the site.   Increase activity slowly    Complete by:  As directed      Discharge Medications   Current Discharge Medication List    START taking these medications   Details  metoprolol  tartrate (LOPRESSOR) 25 MG tablet Take 0.5 tablets (12.5 mg total) by mouth 2 (two) times daily. Qty: 15 tablet, Refills: 12    nitroGLYCERIN (NITROSTAT) 0.4 MG SL tablet Place 1 tablet (0.4 mg total) under the tongue every 5 (five) minutes x 3 doses as needed for chest pain. Qty: 25 tablet, Refills: 2    !! ticagrelor (BRILINTA) 90 MG TABS tablet Take 1  tablet (90 mg total) by mouth 2 (two) times daily. Qty: 60 tablet, Refills: 12    !! ticagrelor (BRILINTA) 90 MG TABS tablet Take 1 tablet (90 mg total) by mouth 2 (two) times daily. Qty: 60 tablet, Refills: 0     !! - Potential duplicate medications found. Please discuss with provider.    CONTINUE these medications which have CHANGED   Details  rosuvastatin (CRESTOR) 20 MG tablet Take 1 tablet (20 mg total) by mouth at bedtime. Qty: 30 tablet, Refills: 12      CONTINUE these medications which have NOT CHANGED   Details  acetaminophen (TYLENOL) 500 MG tablet Take 2,000 mg by mouth daily.    amLODipine (NORVASC) 5 MG tablet Take 5 mg by mouth daily.     aspirin EC 81 MG tablet Take 81 mg by mouth at bedtime.     benazepril (LOTENSIN) 20 MG tablet Take 20 mg by mouth 2 (two) times daily.     BIOTIN PO Take 1 tablet by mouth at bedtime.     Calcium Carbonate-Vitamin D (CALCIUM 600 + D PO) Take 1 tablet by mouth 2 (two) times daily.      cholecalciferol (VITAMIN D) 1000 units tablet Take 1,000 Units by mouth daily.    docusate sodium (COLACE) 100 MG capsule Take 100 mg by mouth daily.     fish oil-omega-3 fatty acids 1000 MG capsule Take 1 g by mouth at bedtime.     gabapentin (NEURONTIN) 300 MG capsule Take 300 mg by mouth 2 (two) times daily. Refills: 1    glucosamine-chondroitin 500-400 MG tablet Take 2 tablets by mouth daily with breakfast.    levothyroxine (SYNTHROID, LEVOTHROID) 75 MCG tablet Take 75 mcg by mouth daily before breakfast.     loratadine-pseudoephedrine (CLARITIN-D 24-HOUR) 10-240 MG per 24 hr tablet  Take 1 tablet by mouth at bedtime.     Melatonin 3 MG TABS Take 6 mg by mouth at bedtime.     omeprazole (PRILOSEC) 20 MG capsule Take 20 mg by mouth daily with breakfast.     polyethylene glycol (MIRALAX / GLYCOLAX) packet Take 17 g by mouth daily as needed (for constipation). Mix with 8 oz liquid and drink    potassium chloride (K-DUR,KLOR-CON) 10 MEQ tablet Take 10 mEq by mouth daily.     triamterene-hydrochlorothiazide (MAXZIDE-25) 37.5-25 MG tablet Take 1 tablet by mouth daily. Refills: 1         Aspirin prescribed at discharge? Yes High Intensity Statin Prescribed? Yes Beta Blocker Prescribed?Yes For EF 40% or less, Was ACEI/ARB Prescribed? No, EF normal ADP Receptor Inhibitor Prescribed? Yes For EF <40%, Aldosterone Inhibitor Prescribed? No, EF normal Was EF assessed during THIS hospitalization? Yes Was Cardiac Rehab II ordered? Yes   Outstanding Labs/Studies  BMP.  Duration of Discharge Encounter   Greater than 30 minutes including physician time.  Signed, Arbutus Leas NP 08/24/2015, 10:26 AM  Patient seen and examined. I agree with the assessment and plan as detailed above. See also my additional thoughts below.   The patient is stable and ready for discharge. I have carefully assessed her right arm. She has mild residual swelling above the elbow related to an IV. She has mild residual swelling of the lower arm related to her radial artery hematoma. The pulse is good. Her arm is stable. She is ready for discharge. I agree with the note and plans as outlined above.Arbutus Leas, MD, [facility] 08/24/2015 10:26 AM

## 2015-08-24 NOTE — Telephone Encounter (Signed)
New message     TOC appt on  8.8.17 @ 8:45 am with Richardson Dopp per Jettie Booze

## 2015-08-24 NOTE — Progress Notes (Signed)
TR BAND REMOVAL  LOCATION:    right radial  DEFLATED PER PROTOCOL:    Yes.    TIME BAND OFF / DRESSING APPLIED:    2215     SITE UPON ARRIVAL:    Level 2   SITE AFTER BAND REMOVAL:    Level 2  CIRCULATION SENSATION AND MOVEMENT:    Within Normal Limits   Yes.    COMMENTS:   10 cc of air removed, coban removed and rewrapped distal to proximal in figure 8 method to improve circulation. Site is bruised and palpable hematoma to mid anterior forearm >3cm

## 2015-08-24 NOTE — Care Management Note (Signed)
Case Management Note  Patient Details  Name: Gabrielle [name] MRN: [id number] Date of Birth: 1945-09-16  Subjective/Objective:    Patient is for dc today, she will be on Brilinta, copay is $45.00, she has 30 day savings card and will go to [facility] to pick up 30 day free.  NCM faxed script to outpatient  Pharmacy.                Action/Plan:   Expected Discharge Date:                  Expected Discharge Plan:  Home/Self Care  In-House Referral:     Discharge planning Services  CM Consult, Medication Assistance  Post Acute Care Choice:    Choice offered to:     DME Arranged:    DME Agency:     HH Arranged:    HH Agency:     Status of Service:  Completed, signed off  If discussed at [employer] of Stay Meetings, dates discussed:    Additional Comments:  Zenon Mayo, RN 08/24/2015, 11:00 AM

## 2015-08-24 NOTE — Progress Notes (Signed)
CARDIAC REHAB PHASE I   PRE:  Rate/Rhythm: 86 SR    BP: sitting 124/56    SaO2:   MODE:  Ambulation: 400 ft   POST:  Rate/Rhythm: 109 ST    BP: sitting 145/75     SaO2:   Tolerated well, no c/o except hip bursitis.  Ed completed with family present. Understands importance of Brilinta/ASA. Will send referral to [place], ACSM 08/24/2015 8:30 AM

## 2015-08-25 NOTE — Telephone Encounter (Signed)
Patient contacted regarding discharge from [facility] on 08/24/15.  Patient understands to follow up with provider Mare Loan, PA on 08/31/15 at 9:30 at [facility]. Patient understands discharge instructions? yes Patient understands medications and regiment? yes Patient understands to bring all medications to this visit? yes   States she is doing good.  (R) wrist that was used for 2 caths is still very bruised but not any worse than when in hospital.  No swelling or redness.  Reviewed her medications.  She verbalizes understanding regarding her meds and her discharge instructions. Will call if has any questions.

## 2015-08-31 ENCOUNTER — Ambulatory Visit (INDEPENDENT_AMBULATORY_CARE_PROVIDER_SITE_OTHER): Payer: Medicare Other | Admitting: Cardiology

## 2015-08-31 ENCOUNTER — Encounter: Payer: Self-pay | Admitting: Cardiology

## 2015-08-31 VITALS — BP 108/50 | HR 77 | Ht 60.5 in | Wt 172.1 lb

## 2015-08-31 DIAGNOSIS — E785 Hyperlipidemia, unspecified: Secondary | ICD-10-CM

## 2015-08-31 DIAGNOSIS — I10 Essential (primary) hypertension: Secondary | ICD-10-CM

## 2015-08-31 DIAGNOSIS — R011 Cardiac murmur, unspecified: Secondary | ICD-10-CM | POA: Diagnosis not present

## 2015-08-31 DIAGNOSIS — I2121 ST elevation (STEMI) myocardial infarction involving left circumflex coronary artery: Secondary | ICD-10-CM | POA: Diagnosis not present

## 2015-08-31 MED ORDER — METOPROLOL TARTRATE 25 MG PO TABS: 12.5000 mg | ORAL_TABLET | Freq: Two times a day (BID) | ORAL | 1 refills | Status: DC

## 2015-08-31 NOTE — Progress Notes (Signed)
08/31/2015 Gabrielle [name]   08/18/1945  [id number]  Primary Physician Gennette Pac, MD Primary Cardiologist: Dr. Burt Knack   Reason for Visit/CC: Hospital follow-up for CAD status post STEMI.  HPI:  Patient is a 70 year old female with a history of hypertension and hyperlipidemia and recent diagnosis of CAD who presents to clinic today for post hospital follow-up. She was admitted to [facility] 08/21/2015 for chest pain and ST elevations consistent with acute STEMI.  She was brought urgently to the cath lab on 08/21/15. She had total occlusion of first OM branch that was successfully treated with DES. She also had 85% stenosis in her mid RCA, which she returned to the cath lab for stage PCI on 08/23/15. Her mid RCA lesion was successfully treated with DES. There is some residual disease in her circumflex that has chronic calcification and minimal landing zone for PCI. This will be treated medically. She did develop a right radial hematoma post cath however this was stable with 2+ radial pulse.  She presents to clinic for post hospital follow-up. She reports that she has done well. She denies any recurrent anginal symptomatology. No recurrent chest pain. No dyspnea. She reports full medication compliance. She's been tolerating medications well. She denies any abnormal bleeding with aspirin and Brilinta. She is tolerating the higher dose of Crestor well without side effects. Her right radial cath site is still with ecchymoses however she has a 2+ radial pulse and denies any significant pain, numbness or tingling.  EKG shows normal sinus rhythm without ischemia. Blood pressure is well-controlled.    Current Outpatient Prescriptions  Medication Sig Dispense Refill  . acetaminophen (TYLENOL) 500 MG tablet Take 2,000 mg by mouth daily.    Marland Kitchen amLODipine (NORVASC) 5 MG tablet Take 5 mg by mouth daily.     Marland Kitchen aspirin EC 81 MG tablet Take 81 mg by mouth at bedtime.     . benazepril  (LOTENSIN) 20 MG tablet Take 20 mg by mouth 2 (two) times daily.     Marland Kitchen BIOTIN PO Take 1 tablet by mouth at bedtime.     . Calcium Carbonate-Vitamin D (CALCIUM 600 + D PO) Take 1 tablet by mouth 2 (two) times daily.      . cholecalciferol (VITAMIN D) 1000 units tablet Take 1,000 Units by mouth daily.    Marland Kitchen docusate sodium (COLACE) 100 MG capsule Take 100 mg by mouth daily.     . fish oil-omega-3 fatty acids 1000 MG capsule Take 1 g by mouth at bedtime.     . gabapentin (NEURONTIN) 300 MG capsule Take 300 mg by mouth 2 (two) times daily.  1  . glucosamine-chondroitin 500-400 MG tablet Take 2 tablets by mouth daily with breakfast.    . levothyroxine (SYNTHROID, LEVOTHROID) 75 MCG tablet Take 75 mcg by mouth daily before breakfast.     . loratadine-pseudoephedrine (CLARITIN-D 24-HOUR) 10-240 MG per 24 hr tablet Take 1 tablet by mouth at bedtime.     . Melatonin 3 MG TABS Take 6 mg by mouth at bedtime.     . metoprolol tartrate (LOPRESSOR) 25 MG tablet Take 0.5 tablets (12.5 mg total) by mouth 2 (two) times daily. 15 tablet 12  . nitroGLYCERIN (NITROSTAT) 0.4 MG SL tablet Place 1 tablet (0.4 mg total) under the tongue every 5 (five) minutes x 3 doses as needed for chest pain. 25 tablet 2  . omeprazole (PRILOSEC) 20 MG capsule Take 20 mg by mouth daily with breakfast.     .  polyethylene glycol (MIRALAX / GLYCOLAX) packet Take 17 g by mouth daily as needed (for constipation). Mix with 8 oz liquid and drink    . potassium chloride (K-DUR,KLOR-CON) 10 MEQ tablet Take 10 mEq by mouth daily.     . rosuvastatin (CRESTOR) 20 MG tablet Take 1 tablet (20 mg total) by mouth at bedtime. 30 tablet 12  . ticagrelor (BRILINTA) 90 MG TABS tablet Take 1 tablet (90 mg total) by mouth 2 (two) times daily. 60 tablet 12  . triamterene-hydrochlorothiazide (MAXZIDE-25) 37.5-25 MG tablet Take 1 tablet by mouth daily.  1   No current facility-administered medications for this visit.     Allergies  Allergen Reactions  .  Penicillins Other (See Comments)    Allergy as an infant - no other information available   . Pravastatin Other (See Comments)    Effects liver function    Social History   Social History  . Marital status: Married    Spouse name: N/A  . Number of children: N/A  . Years of education: N/A   Occupational History  . Not on file.   Social History Main Topics  . Smoking status: Former Smoker    Packs/day: 1.00    Years: 19.00    Types: Cigarettes    Quit date: 08/14/1983  . Smokeless tobacco: Never Used  . Alcohol use 1.2 oz/week    2 Glasses of wine per week     Comment: per day  . Drug use: No  . Sexual activity: Yes   Other Topics Concern  . Not on file   Social History Narrative  . No narrative on file     Review of Systems: General: negative for chills, fever, night sweats or weight changes.  Cardiovascular: negative for chest pain, dyspnea on exertion, edema, orthopnea, palpitations, paroxysmal nocturnal dyspnea or shortness of breath Dermatological: negative for rash Respiratory: negative for cough or wheezing Urologic: negative for hematuria Abdominal: negative for nausea, vomiting, diarrhea, bright red blood per rectum, melena, or hematemesis Neurologic: negative for visual changes, syncope, or dizziness All other systems reviewed and are otherwise negative except as noted above.    Blood pressure (!) 108/50, pulse 77, height 5' 0.5" (1.537 m), weight 172 lb 1.9 oz (78.1 kg).  General appearance: alert, cooperative and no distress Neck: no carotid bruit and no JVD Lungs: clear to auscultation bilaterally Heart: regular rate and rhythm, S1, S2 normal, no murmur, click, rub or gallop Extremities: no LEE Pulses: 2+ and symmetric Skin: warm and dry Neurologic: Grossly normal  EKG NSR. No ischemia.   ASSESSMENT AND PLAN:   1.  CAD: Status post STEMI. Status post PCI to the first OM branch and PCI to the mid RCA. EF normal at 50-55%. She is stable without  recurrent anginal symptomatology. No chest pain or dyspnea. Continue medical therapy with aspirin plus Brilinta, Crestor, Metroprolol and benazepril.  2. Hypertension: Well controlled on current regimen. Soft but stable.  3. Hyperlipidemia: Lipid panel showed a LDL level of 82 mg/dL  Goal LDL is less than 70. Crestor was increased from 5 mg to 20 mg. We will recheck fasting lipid panel and hepatic function tests in 6 weeks.  4. Right radial hematoma: improving. 2 + radial pulse. Patient advised to refrain from heavy lifting with her right hand.  PLAN  F/u with Dr. Burt Knack in 6-8 weeks.  Lyda Jester PA-C 08/31/2015 9:53 AM

## 2015-08-31 NOTE — Patient Instructions (Addendum)
Medication Instructions:  Your physician recommends that you continue on your current medications as directed. Please refer to the Current Medication list given to you today.   Labwork: 6 WEEKS:  Your physician recommends that you return for a FASTING lipid profile & HEPATIC   Testing/Procedures: None ordered  Follow-Up: Your physician recommends that you schedule a follow-up appointment in: [place]    Any Other Special Instructions Will Be Listed Below (If Applicable).  You have been referred to [place].  They will contact you to schedule the appointment.    If you need a refill on your cardiac medications before your next appointment, please call your pharmacy.

## 2015-09-07 ENCOUNTER — Encounter: Payer: Medicare Other | Admitting: Physician Assistant

## 2015-09-09 ENCOUNTER — Telehealth (HOSPITAL_COMMUNITY): Payer: Self-pay | Admitting: *Deleted

## 2015-09-09 NOTE — Telephone Encounter (Signed)
Received signed md referral for pt to participate in cardiac rehab s/p stemi, stent.  Message left for pt to please contact for more information and to sign up for class. Contact information provided. Cherre Huger, BSN

## 2015-09-14 ENCOUNTER — Telehealth: Payer: Self-pay | Admitting: Cardiovascular Disease

## 2015-09-14 NOTE — Telephone Encounter (Signed)
New Message  Pt was informed on 8.1.17 per Lyda Jester she needed to schedule a visit with MD Burt Knack, 6 week f/u.  Pt expressed someone was suppose to call her back about an appt but she has not received one as of yet.  Offered first avail 11.17, but expressed it was too far out and expressed of Pa, that's when she expressed PA informed her she needs to see MD Burt Knack.  Please follow up with pt. Thanks!

## 2015-09-14 NOTE — Telephone Encounter (Signed)
I spoke with the pt and follow-up appointment arranged on 10/14/15 with Dr Burt Knack.

## 2015-09-20 DIAGNOSIS — R300 Dysuria: Secondary | ICD-10-CM | POA: Diagnosis not present

## 2015-09-20 DIAGNOSIS — N952 Postmenopausal atrophic vaginitis: Secondary | ICD-10-CM | POA: Diagnosis not present

## 2015-09-21 DIAGNOSIS — R300 Dysuria: Secondary | ICD-10-CM | POA: Diagnosis not present

## 2015-09-28 ENCOUNTER — Encounter: Payer: Self-pay | Admitting: Cardiovascular Disease

## 2015-10-12 ENCOUNTER — Encounter (HOSPITAL_COMMUNITY)
Admission: RE | Admit: 2015-10-12 | Discharge: 2015-10-12 | Disposition: A | Discharge status: Home / Self Care | Payer: Medicare Other | Source: Ambulatory Visit | Attending: Cardiovascular Disease | Admitting: Cardiovascular Disease

## 2015-10-12 ENCOUNTER — Other Ambulatory Visit (INDEPENDENT_AMBULATORY_CARE_PROVIDER_SITE_OTHER): Payer: Medicare Other | Admitting: *Deleted

## 2015-10-12 ENCOUNTER — Encounter (HOSPITAL_COMMUNITY): Payer: Self-pay

## 2015-10-12 VITALS — BP 111/45 | HR 81 | Ht 60.5 in | Wt 171.1 lb

## 2015-10-12 DIAGNOSIS — Z955 Presence of coronary angioplasty implant and graft: Secondary | ICD-10-CM | POA: Diagnosis not present

## 2015-10-12 DIAGNOSIS — I213 ST elevation (STEMI) myocardial infarction of unspecified site: Secondary | ICD-10-CM | POA: Diagnosis not present

## 2015-10-12 DIAGNOSIS — E785 Hyperlipidemia, unspecified: Secondary | ICD-10-CM

## 2015-10-12 LAB — HEPATIC FUNCTION PANEL
ALT: 24 U/L (ref 6–29)
AST: 19 U/L (ref 10–35)
Albumin: 4.4 g/dL (ref 3.6–5.1)
Alkaline Phosphatase: 73 U/L (ref 33–130)
Bilirubin, Direct: 0.1 mg/dL (ref <=0.2)
Indirect Bilirubin: 0.3 mg/dL (ref 0.2–1.2)
Total Bilirubin: 0.4 mg/dL (ref 0.2–1.2)
Total Protein: 7 g/dL (ref 6.1–8.1)

## 2015-10-12 LAB — LIPID PANEL
Cholesterol: 180 mg/dL (ref 125–200)
HDL: 74 mg/dL (ref >=46)
LDL Cholesterol: 78 mg/dL (ref <130)
Total CHOL/HDL Ratio: 2.4 Ratio (ref <=5.0)
Triglycerides: 138 mg/dL (ref <150)
VLDL: 28 mg/dL (ref <30)

## 2015-10-12 NOTE — Progress Notes (Signed)
Cardiac Rehab Medication Review by a Pharmacist  Does the patient  feel that his/her medications are working for him/her?  yes  Has the patient been experiencing any side effects to the medications prescribed?  no  Does the patient measure his/her own blood pressure or blood glucose at home?  Occasionally, needs new blood pressure cuff right now. Understands her blood pressure goal and says her SBP is usually <120 mmHg.  Does the patient have any problems obtaining medications due to transportation or finances?   yes  Understanding of regimen: excellent Understanding of indications: good Potential of compliance: good  Pharmacist comments: Patient is a 70 yo female who presents to cardiac rehab in good spirits. She is a good historian and knows the names, doses, and administration of all of her medications. Does not endorse any issues with her current medication regimen.   Demetrius Charity, PharmD Acute Care Pharmacy Resident  Pager: [phone] 10/12/2015

## 2015-10-14 ENCOUNTER — Ambulatory Visit (INDEPENDENT_AMBULATORY_CARE_PROVIDER_SITE_OTHER): Payer: Medicare Other | Admitting: Cardiovascular Disease

## 2015-10-14 ENCOUNTER — Encounter: Payer: Self-pay | Admitting: Cardiovascular Disease

## 2015-10-14 VITALS — BP 96/50 | HR 83 | Ht 60.0 in | Wt 169.6 lb

## 2015-10-14 DIAGNOSIS — E785 Hyperlipidemia, unspecified: Secondary | ICD-10-CM

## 2015-10-14 DIAGNOSIS — I10 Essential (primary) hypertension: Secondary | ICD-10-CM

## 2015-10-14 DIAGNOSIS — I2121 ST elevation (STEMI) myocardial infarction involving left circumflex coronary artery: Secondary | ICD-10-CM | POA: Diagnosis not present

## 2015-10-14 NOTE — Progress Notes (Signed)
Cardiology Office Note Date:  10/14/2015   ID:  GIADA [name], DOB [birth year], MRN [id number]  PCP:  Gennette Pac, MD  Cardiologist:  Sherren Mocha, MD    Chief Complaint  Patient presents with  . Follow-up    6/8 week, no sx   History of Present Illness: Gabrielle [name] is a 70 y.o. female who presents for follow-up evaluation. The patient presented with an acute inferolateral STEMI in July 2017. She had total occlusion of the first OM branch of the circumflex and was treated with primary PCI using a drug-eluting stent. She underwent staged PCI severe stenosis in the right coronary artery without complication. She was last seen in the office 08/31/2015 at which time she was doing well.  The patient has just recently enrolled in cardiac rehabilitation. Overall she is doing quite well. She denies any recurrence of chest pain or pressure, shortness of breath, or leg swelling. She's had no palpitations. She has noted some low blood pressure readings. She has occasional dizziness.  Past Medical History:  Diagnosis Date  . Arthritis 01-17-11   degenerative disc disease-all joints  . Cholesterol serum elevated 01-17-11   tx. meds  . GERD (gastroesophageal reflux disease) 01-17-11   tx. omeprazole  . Heart murmur    slight murmur  . Hypertension 01-17-11   tx. meds  . Seasonal allergies   . Sinus problem 01-17-11   tx. Claritin D  . ST elevation (STEMI) myocardial infarction involving left circumflex coronary artery ([place]) 08/21/2015    Past Surgical History:  Procedure Laterality Date  . ABDOMINAL HYSTERECTOMY  01-17-11   '93-Hysterectomy-heavy bleeding  . BACK SURGERY  01-17-11   04-20-10-T-Lift lumbar fusion  . [facility] PROCEDURE  01-17-11   Bladder sling  . CARDIAC CATHETERIZATION N/A 08/21/2015   Procedure: Left Heart Cath and Coronary Angiography;  Surgeon: Sherren Mocha, MD;  Location: [facility];  Service: Cardiovascular;  Laterality: N/A;  . CARDIAC  CATHETERIZATION N/A 08/21/2015   Procedure: Coronary Stent Intervention;  Surgeon: Sherren Mocha, MD;  Location: [facility];  Service: Cardiovascular;  Laterality: N/A;  . CARDIAC CATHETERIZATION N/A 08/23/2015   Procedure: Coronary Stent Intervention;  Surgeon: Leonie Man, MD;  Location: [facility];  Service: Cardiovascular;  Laterality: N/A;  promus 2.5x16 in RCA  . CARDIAC CATHETERIZATION N/A 08/23/2015   Procedure: Left Heart Cath and Coronary Angiography;  Surgeon: Leonie Man, MD;  Location: [facility];  Service: Cardiovascular;  Laterality: N/A;  . CARPAL TUNNEL RELEASE  01-17-11   '02-left, also right  . COLONOSCOPY    . HARDWARE REMOVAL Right 05/15/2013   Procedure: REMOVAL RIGHT L4-L5 PEDICLE SCREWS AND ROD ;  Surgeon: Melina Schools, MD;  Location: [facility];  Service: Orthopedics;  Laterality: Right;  . JOINT REPLACEMENT  01-17-11    RTKA' 02  . LAPAROSCOPIC APPENDECTOMY N/A 08/13/2013   Procedure: APPENDECTOMY LAPAROSCOPIC;  Surgeon: Pedro Earls, MD;  Location: [facility];  Service: General;  Laterality: N/A;  . TONSILLECTOMY  01-17-11   child  . TOTAL KNEE ARTHROPLASTY  01/19/2011   Procedure: TOTAL KNEE ARTHROPLASTY;  Surgeon: Johnn Hai;  Location: [facility];  Service: Orthopedics;  Laterality: Left;    Current Outpatient Prescriptions  Medication Sig Dispense Refill  . acetaminophen (TYLENOL) 500 MG tablet Take 1,500 mg by mouth daily.     Marland Kitchen aspirin EC 81 MG tablet Take 81 mg by mouth at bedtime.     Marland Kitchen  benazepril (LOTENSIN) 20 MG tablet Take 20 mg by mouth 2 (two) times daily.     Marland Kitchen BIOTIN PO Take 1 tablet by mouth at bedtime.     . Calcium Carbonate-Vitamin D (CALCIUM 600 + D PO) Take 1 tablet by mouth 2 (two) times daily.      . cholecalciferol (VITAMIN D) 1000 units tablet Take 1,000 Units by mouth daily.    Marland Kitchen docusate sodium (COLACE) 100 MG capsule Take 100 mg by mouth daily.     . fish oil-omega-3 fatty acids 1000 MG capsule Take 1 g by  mouth at bedtime.     . gabapentin (NEURONTIN) 300 MG capsule Take 300 mg by mouth 2 (two) times daily.  1  . glucosamine-chondroitin 500-400 MG tablet Take 2 tablets by mouth daily with breakfast.    . levothyroxine (SYNTHROID, LEVOTHROID) 75 MCG tablet Take 75 mcg by mouth daily before breakfast.     . loratadine-pseudoephedrine (CLARITIN-D 24-HOUR) 10-240 MG per 24 hr tablet Take 1 tablet by mouth at bedtime.     . Melatonin 3 MG TABS Take 6 mg by mouth at bedtime.     . metoprolol tartrate (LOPRESSOR) 25 MG tablet Take 0.5 tablets (12.5 mg total) by mouth 2 (two) times daily. 90 tablet 1  . nitroGLYCERIN (NITROSTAT) 0.4 MG SL tablet Place 1 tablet (0.4 mg total) under the tongue every 5 (five) minutes x 3 doses as needed for chest pain. 25 tablet 2  . omeprazole (PRILOSEC) 20 MG capsule Take 20 mg by mouth daily with breakfast.     . polyethylene glycol (MIRALAX / GLYCOLAX) packet Take 17 g by mouth daily as needed (for constipation). Mix with 8 oz liquid and drink    . potassium chloride (K-DUR,KLOR-CON) 10 MEQ tablet Take 10 mEq by mouth daily.     . rosuvastatin (CRESTOR) 20 MG tablet Take 1 tablet (20 mg total) by mouth at bedtime. 30 tablet 12  . ticagrelor (BRILINTA) 90 MG TABS tablet Take 1 tablet (90 mg total) by mouth 2 (two) times daily. 60 tablet 12  . triamterene-hydrochlorothiazide (MAXZIDE-25) 37.5-25 MG tablet Take 1 tablet by mouth daily.  1   No current facility-administered medications for this visit.     Allergies:   Penicillins and Pravastatin   Social History:  The patient  reports that she quit smoking about 32 years ago. Her smoking use included Cigarettes. She has a 19.00 pack-year smoking history. She has never used smokeless tobacco. She reports that she drinks about 1.2 oz of alcohol per week . She reports that she does not use drugs.   Family History:  The patient's family history is not on file.    ROS:  Please see the history of present illness.  All other  systems are reviewed and negative.    PHYSICAL EXAM: VS:  BP (!) 96/50   Pulse 83   Ht 5' (1.524 m)   Wt 76.9 kg (169 lb 9.6 oz)   SpO2 96%   BMI 33.12 kg/m  , BMI Body mass index is 33.12 kg/m. GEN: Well nourished, well developed, in no acute distress  HEENT: normal  Neck: no JVD, no masses. No carotid bruits Cardiac: RRR without murmur or gallop                Respiratory:  clear to auscultation bilaterally, normal work of breathing GI: soft, nontender, nondistended, + BS MS: no deformity or atrophy  Ext: no pretibial edema, pedal pulses 2+= bilaterally  Skin: warm and dry, no rash Neuro:  Strength and sensation are intact Psych: euthymic mood, full affect  EKG:  EKG is not ordered today.  Recent Labs: 08/21/2015: B Natriuretic Peptide 27.7; TSH 1.982 08/24/2015: BUN 12; Creatinine, Ser 0.82; Hemoglobin 11.6; Platelets 175; Potassium 3.9; Sodium 135 10/12/2015: ALT 24   Lipid Panel     Component Value Date/Time   CHOL 180 10/12/2015 0835   TRIG 138 10/12/2015 0835   HDL 74 10/12/2015 0835   CHOLHDL 2.4 10/12/2015 0835   VLDL 28 10/12/2015 0835   LDLCALC 78 10/12/2015 0835      Wt Readings from Last 3 Encounters:  10/14/15 76.9 kg (169 lb 9.6 oz)  08/31/15 78.1 kg (172 lb 1.9 oz)  08/24/15 77.4 kg (170 lb 10.2 oz)     Cardiac Studies Reviewed: Cardiac Cath 08-21-2015: Conclusion    Mid RCA lesion, 85% stenosed.  Ost LM lesion, 40% stenosed.  Prox LAD to Mid LAD lesion, 25% stenosed.  Ost Cx to Prox Cx lesion, 70% stenosed.  There is mild left ventricular systolic dysfunction.  1st Mrg lesion, 100% stenosed. Post intervention, there is a 0% residual stenosis.   1. Acute inferolateral STEMI secondary to total occlusion of the first OM branch. Is is treated successfully with primary PCI.  2. Mild nonobstructive disease of the left main and LAD  3. Severe stenosis of the mid RCA: Recommend staged PCI prior to discharge  4. Mild segmental contraction  abnormality the left ventricle with preserved overall LVEF  Recommendations: Aspirin and brilinta 12 months, aggressive medical therapy, staged PCI the right coronary artery on Monday.    ASSESSMENT AND PLAN: 1.  CAD, native vessel, now approaching 2 months out from an acute inferolateral STEMI. The patient is doing well on her current medical program. She will continue on aspirin and brilinta. She is tolerating Crestor 20 mg daily. I'm glad she has enrolled in cardiac rehabilitation.  2. Hyperlipidemia: Lipids reviewed with a cholesterol 180, HDL 74, LDL 78. Patient will continue on Crestor.  3. Hypertension: Patient is treated with a combination of amlodipine, benazepril, metoprolol, and Maxide. Will hold amlodipine as her blood pressure is running too low. Blood pressure will be monitored at cardiac rehabilitation.  Current medicines are reviewed with the patient today.  The patient does not have concerns regarding medicines.  Labs/ tests ordered today include:  No orders of the defined types were placed in this encounter.   Disposition:   FU 3-4 months with APP  Signed, Sherren Mocha, MD  10/14/2015 6:00 PM    [address], [place], [place]  [id number] Phone: [phone]; Fax: [phone]

## 2015-10-14 NOTE — Patient Instructions (Addendum)
Medication Instructions:  Your physician has recommended you make the following change in your medication:  1. STOP Amlodipine  Labwork: No new orders.   Testing/Procedures: No new orders.   Follow-Up: Your physician recommends that you schedule a follow-up appointment in: 4 MONTHS with Richardson Dopp PA-C   Any Other Special Instructions Will Be Listed Below (If Applicable).     If you need a refill on your cardiac medications before your next appointment, please call your pharmacy.

## 2015-10-15 NOTE — Progress Notes (Signed)
Cardiac Individual Treatment Plan  Patient Details  Name: Gabrielle [name] MRN: [id number] Date of Birth: Jul 29, 1945 Referring Provider:   Flowsheet Row CARDIAC REHAB PHASE II ORIENTATION from 10/12/2015 in [place]  Referring Provider  Sherren Mocha, MD      Initial Encounter Date:  [place] PHASE II ORIENTATION from 10/12/2015 in [facility]  Date  10/12/15  Referring Provider  Sherren Mocha, MD      Visit Diagnosis: ST elevation myocardial infarction (STEMI), unspecified artery ([place])  S/P drug eluting coronary stent placement  Patient's Home Medications on Admission:  Current Outpatient Prescriptions:  .  acetaminophen (TYLENOL) 500 MG tablet, Take 1,500 mg by mouth daily. , Disp: , Rfl:  .  aspirin EC 81 MG tablet, Take 81 mg by mouth at bedtime. , Disp: , Rfl:  .  benazepril (LOTENSIN) 20 MG tablet, Take 20 mg by mouth 2 (two) times daily. , Disp: , Rfl:  .  BIOTIN PO, Take 1 tablet by mouth at bedtime. , Disp: , Rfl:  .  Calcium Carbonate-Vitamin D (CALCIUM 600 + D PO), Take 1 tablet by mouth 2 (two) times daily.  , Disp: , Rfl:  .  cholecalciferol (VITAMIN D) 1000 units tablet, Take 1,000 Units by mouth daily., Disp: , Rfl:  .  docusate sodium (COLACE) 100 MG capsule, Take 100 mg by mouth daily. , Disp: , Rfl:  .  fish oil-omega-3 fatty acids 1000 MG capsule, Take 1 g by mouth at bedtime. , Disp: , Rfl:  .  gabapentin (NEURONTIN) 300 MG capsule, Take 300 mg by mouth 2 (two) times daily., Disp: , Rfl: 1 .  glucosamine-chondroitin 500-400 MG tablet, Take 2 tablets by mouth daily with breakfast., Disp: , Rfl:  .  levothyroxine (SYNTHROID, LEVOTHROID) 75 MCG tablet, Take 75 mcg by mouth daily before breakfast. , Disp: , Rfl:  .  loratadine-pseudoephedrine (CLARITIN-D 24-HOUR) 10-240 MG per 24 hr tablet, Take 1 tablet by mouth at bedtime. , Disp: , Rfl:  .  Melatonin 3 MG TABS, Take 6 mg by  mouth at bedtime. , Disp: , Rfl:  .  metoprolol tartrate (LOPRESSOR) 25 MG tablet, Take 0.5 tablets (12.5 mg total) by mouth 2 (two) times daily., Disp: 90 tablet, Rfl: 1 .  nitroGLYCERIN (NITROSTAT) 0.4 MG SL tablet, Place 1 tablet (0.4 mg total) under the tongue every 5 (five) minutes x 3 doses as needed for chest pain., Disp: 25 tablet, Rfl: 2 .  omeprazole (PRILOSEC) 20 MG capsule, Take 20 mg by mouth daily with breakfast. , Disp: , Rfl:  .  polyethylene glycol (MIRALAX / GLYCOLAX) packet, Take 17 g by mouth daily as needed (for constipation). Mix with 8 oz liquid and drink, Disp: , Rfl:  .  potassium chloride (K-DUR,KLOR-CON) 10 MEQ tablet, Take 10 mEq by mouth daily. , Disp: , Rfl:  .  rosuvastatin (CRESTOR) 20 MG tablet, Take 1 tablet (20 mg total) by mouth at bedtime., Disp: 30 tablet, Rfl: 12 .  ticagrelor (BRILINTA) 90 MG TABS tablet, Take 1 tablet (90 mg total) by mouth 2 (two) times daily., Disp: 60 tablet, Rfl: 12 .  triamterene-hydrochlorothiazide (MAXZIDE-25) 37.5-25 MG tablet, Take 1 tablet by mouth daily., Disp: , Rfl: 1  Past Medical History: Past Medical History:  Diagnosis Date  . Arthritis 01-17-11   degenerative disc disease-all joints  . Cholesterol serum elevated 01-17-11   tx. meds  . GERD (gastroesophageal reflux disease) 01-17-11  tx. omeprazole  . Heart murmur    slight murmur  . Hypertension 01-17-11   tx. meds  . Seasonal allergies   . Sinus problem 01-17-11   tx. Claritin D  . ST elevation (STEMI) myocardial infarction involving left circumflex coronary artery ([place]) 08/21/2015    Tobacco Use: History  Smoking Status  . Former Smoker  . Packs/day: 1.00  . Years: 19.00  . Types: Cigarettes  . Quit date: 08/14/1983  Smokeless Tobacco  . Never Used    Labs: Recent Review Flowsheet Data    Labs for ITP Cardiac and Pulmonary Rehab Latest Ref Rng & Units 08/21/2015 08/22/2015 10/12/2015   Cholestrol 125 - 200 mg/dL - 176 180   LDLCALC <130 mg/dL - 82  78   HDL >=46 mg/dL - 63 74   Trlycerides <150 mg/dL - 153(H) 138   TCO2 0 - 100 mmol/L 22 - -      Capillary Blood Glucose: Lab Results  Component Value Date   GLUCAP 100 (H) 08/23/2015     Exercise Target Goals: Date: 10/12/15  Exercise Program Goal: Individual exercise prescription set with THRR, safety & activity barriers. Participant demonstrates ability to understand and report RPE using BORG scale, to self-measure pulse accurately, and to acknowledge the importance of the exercise prescription.  Exercise Prescription Goal: Starting with aerobic activity 30 plus minutes a day, 3 days per week for initial exercise prescription. Provide home exercise prescription and guidelines that participant acknowledges understanding prior to discharge.  Activity Barriers & Risk Stratification:     Activity Barriers & Cardiac Risk Stratification - 10/12/15 1447      Activity Barriers & Cardiac Risk Stratification   Activity Barriers Arthritis;Back Problems;Left Knee Replacement;Right Knee Replacement;Joint Problems;History of Falls;Assistive Device   Cardiac Risk Stratification High      6 Minute Walk:     6 Minute Walk    Row Name 10/12/15 1653         6 Minute Walk   Phase Initial     Distance 1419 feet     Walk Time 6 minutes     # of Rest Breaks 0     MPH 2.69     METS 2.75     RPE 11     VO2 Peak 9.61     Symptoms Yes (comment)     Comments Pt c/o fatigue at end of walk, resolved with rest.     Resting HR 81 bpm     Resting BP 111/45     Max Ex. HR 102 bpm     Max Ex. BP 124/60     2 Minute Post BP 104/60        Initial Exercise Prescription:     Initial Exercise Prescription - 10/15/15 1700      Date of Initial Exercise RX and Referring Provider   Date 10/12/15   Referring Provider Sherren Mocha, MD     Bike   Level 0.7   Minutes 10   METs 2.71     NuStep   Level 2   Minutes 10   METs 2.5     Track   Laps 8   Minutes 10   METs 2.39      Prescription Details   Frequency (times per week) 3   Duration Progress to 30 minutes of continuous aerobic without signs/symptoms of physical distress     Intensity   THRR 40-80% of Max Heartrate 60-121   Ratings of Perceived Exertion 11-13  Perceived Dyspnea 0-4     Progression   Progression Continue to progress workloads to maintain intensity without signs/symptoms of physical distress.     Resistance Training   Training Prescription Yes   Weight 1lbs   Reps 10-12      Perform Capillary Blood Glucose checks as needed.  Exercise Prescription Changes:   Exercise Comments:   Discharge Exercise Prescription (Final Exercise Prescription Changes):   Nutrition:  Target Goals: Understanding of nutrition guidelines, daily intake of sodium 1500mg , cholesterol 200mg , calories 30% from fat and 7% or less from saturated fats, daily to have 5 or more servings of fruits and vegetables.  Biometrics:     Pre Biometrics - 10/12/15 1415      Pre Biometrics   Height 5' 0.5" (1.537 m)   Weight 171 lb 1.2 oz (77.6 kg)   Waist Circumference 38 inches   Hip Circumference 45 inches   Waist to Hip Ratio 0.84 %   BMI (Calculated) 32.9   Triceps Skinfold 40 mm   % Body Fat 45.5 %   Grip Strength 26 kg   Flexibility 13.5 in   Single Leg Stand 30 seconds       Nutrition Therapy Plan and Nutrition Goals:     Nutrition Therapy & Goals - 10/14/15 0932      Nutrition Therapy   Diet Therapeutic Lifestyle Changes     Personal Nutrition Goals   Personal Goal #1 1-2 lb wt loss/week to a goal wt loss of 10-15 lb at graduation from [facility] [place], educate and counsel regarding individualized specific dietary modifications aiming towards targeted core components such as weight, hypertension, lipid management, diabetes, heart failure and other comorbidities.   Expected Outcomes Short Term Goal: Understand basic principles of dietary  content, such as calories, fat, sodium, cholesterol and nutrients.;Long Term Goal: Adherence to prescribed nutrition plan.      Nutrition Discharge: Nutrition Scores:     Nutrition Assessments - 10/14/15 0934      MEDFICTS Scores   Pre Score 15      Nutrition Goals Re-Evaluation:   Psychosocial: Target Goals: Acknowledge presence or absence of depression, maximize coping skills, provide positive support system. Participant is able to verbalize types and ability to use techniques and skills needed for reducing stress and depression.  Initial Review & Psychosocial Screening:     Initial Psych Review & Screening - 10/12/15 1523      Initial Review   Current issues with Current Stress Concerns     Family Dynamics   Good Support System? Yes     Barriers   Psychosocial barriers to participate in program The patient should benefit from training in stress management and relaxation.     Screening Interventions   Interventions Encouraged to exercise      Quality of Life Scores:     Quality of Life - 10/15/15 1710      Quality of Life Scores   Health/Function Pre 29.6 %   Socioeconomic Pre 28.21 %   Psych/Spiritual Pre 28.79 %   Family Pre 30 %   GLOBAL Pre 29.21 %      PHQ-9: Recent Review Flowsheet Data    There is no flowsheet data to display.      Psychosocial Evaluation and Intervention:   Psychosocial Re-Evaluation:   Vocational Rehabilitation: Provide vocational rehab assistance to qualifying candidates.   Vocational Rehab Evaluation & Intervention:     Vocational Rehab -  10/12/15 1517      Initial Vocational Rehab Evaluation & Intervention   Assessment shows need for Vocational Rehabilitation No      Education: Education Goals: Education classes will be provided on a weekly basis, covering required topics. Participant will state understanding/return demonstration of topics presented.  Learning Barriers/Preferences:     Learning  Barriers/Preferences - 10/15/15 1711      Learning Barriers/Preferences   Learning Barriers None   Learning Preferences Written Material;Video;Pictoral      Education Topics: Count Your Pulse:  -Group instruction provided by verbal instruction, demonstration, patient participation and written materials to support subject.  Instructors address importance of being able to find your pulse and how to count your pulse when at home without a heart monitor.  Patients get hands on experience counting their pulse with staff help and individually.   Heart Attack, Angina, and Risk Factor Modification:  -Group instruction provided by verbal instruction, video, and written materials to support subject.  Instructors address signs and symptoms of angina and heart attacks.    Also discuss risk factors for heart disease and how to make changes to improve heart health risk factors.   Functional Fitness:  -Group instruction provided by verbal instruction, demonstration, patient participation, and written materials to support subject.  Instructors address safety measures for doing things around the house.  Discuss how to get up and down off the floor, how to pick things up properly, how to safely get out of a chair without assistance, and balance training.   Meditation and Mindfulness:  -Group instruction provided by verbal instruction, patient participation, and written materials to support subject.  Instructor addresses importance of mindfulness and meditation practice to help reduce stress and improve awareness.  Instructor also leads participants through a meditation exercise.    Stretching for Flexibility and Mobility:  -Group instruction provided by verbal instruction, patient participation, and written materials to support subject.  Instructors lead participants through series of stretches that are designed to increase flexibility thus improving mobility.  These stretches are additional exercise for  major muscle groups that are typically performed during regular warm up and cool down.   Hands Only CPR Anytime:  -Group instruction provided by verbal instruction, video, patient participation and written materials to support subject.  Instructors co-teach with AHA video for hands only CPR.  Participants get hands on experience with mannequins.   Nutrition I class: Heart Healthy Eating:  -Group instruction provided by PowerPoint slides, verbal discussion, and written materials to support subject matter. The instructor gives an explanation and review of the Therapeutic Lifestyle Changes diet recommendations, which includes a discussion on lipid goals, dietary fat, sodium, fiber, plant stanol/sterol esters, sugar, and the components of a well-balanced, healthy diet.   Nutrition II class: Lifestyle Skills:  -Group instruction provided by PowerPoint slides, verbal discussion, and written materials to support subject matter. The instructor gives an explanation and review of label reading, grocery shopping for heart health, heart healthy recipe modifications, and ways to make healthier choices when eating out.   Diabetes Question & Answer:  -Group instruction provided by PowerPoint slides, verbal discussion, and written materials to support subject matter. The instructor gives an explanation and review of diabetes co-morbidities, pre- and post-prandial blood glucose goals, pre-exercise blood glucose goals, signs, symptoms, and treatment of hypoglycemia and hyperglycemia, and foot care basics.   Diabetes Blitz:  -Group instruction provided by PowerPoint slides, verbal discussion, and written materials to support subject matter. The instructor gives an explanation and  review of the physiology behind type 1 and type 2 diabetes, diabetes medications and rational behind using different medications, pre- and post-prandial blood glucose recommendations and Hemoglobin A1c goals, diabetes diet, and exercise  including blood glucose guidelines for exercising safely.    Portion Distortion:  -Group instruction provided by PowerPoint slides, verbal discussion, written materials, and food models to support subject matter. The instructor gives an explanation of serving size versus portion size, changes in portions sizes over the last 20 years, and what consists of a serving from each food group.   Stress Management:  -Group instruction provided by verbal instruction, video, and written materials to support subject matter.  Instructors review role of stress in heart disease and how to cope with stress positively.     Exercising on Your Own:  -Group instruction provided by verbal instruction, power point, and written materials to support subject.  Instructors discuss benefits of exercise, components of exercise, frequency and intensity of exercise, and end points for exercise.  Also discuss use of nitroglycerin and activating EMS.  Review options of places to exercise outside of rehab.  Review guidelines for sex with heart disease.   Cardiac Drugs I:  -Group instruction provided by verbal instruction and written materials to support subject.  Instructor reviews cardiac drug classes: antiplatelets, anticoagulants, beta blockers, and statins.  Instructor discusses reasons, side effects, and lifestyle considerations for each drug class.   Cardiac Drugs II:  -Group instruction provided by verbal instruction and written materials to support subject.  Instructor reviews cardiac drug classes: angiotensin converting enzyme inhibitors (ACE-I), angiotensin II receptor blockers (ARBs), nitrates, and calcium channel blockers.  Instructor discusses reasons, side effects, and lifestyle considerations for each drug class.   Anatomy and Physiology of the Circulatory System:  -Group instruction provided by verbal instruction, video, and written materials to support subject.  Reviews functional anatomy of heart, how it  relates to various diagnoses, and what role the heart plays in the overall system.   Knowledge Questionnaire Score:     Knowledge Questionnaire Score - 10/15/15 1709      Knowledge Questionnaire Score   Pre Score 22/24      Core Components/Risk Factors/Patient Goals at Admission:     Personal Goals and Risk Factors at Admission - 10/12/15 1522      Core Components/Risk Factors/Patient Goals on Admission    Weight Management Weight Loss;Yes   Intervention Weight Management: Develop a combined nutrition and exercise program designed to reach desired caloric intake, while maintaining appropriate intake of nutrient and fiber, sodium and fats, and appropriate energy expenditure required for the weight goal.;Weight Management: Provide education and appropriate resources to help participant work on and attain dietary goals.;Weight Management/Obesity: Establish reasonable short term and long term weight goals.   Expected Outcomes Short Term: Continue to assess and modify interventions until short term weight is achieved;Long Term: Adherence to nutrition and physical activity/exercise program aimed toward attainment of established weight goal   Hypertension Yes   Intervention Provide education on lifestyle modifcations including regular physical activity/exercise, weight management, moderate sodium restriction and increased consumption of fresh fruit, vegetables, and low fat dairy, alcohol moderation, and smoking cessation.;Monitor prescription use compliance.   Expected Outcomes Short Term: Continued assessment and intervention until BP is < 140/29mm HG in hypertensive participants. < 130/41mm HG in hypertensive participants with diabetes, heart failure or chronic kidney disease.;Long Term: Maintenance of blood pressure at goal levels.   Lipids Yes   Intervention Provide education and support for participant  on nutrition & aerobic/resistive exercise along with prescribed medications to achieve LDL  70mg , HDL >40mg .   Expected Outcomes Short Term: Participant states understanding of desired cholesterol values and is compliant with medications prescribed. Participant is following exercise prescription and nutrition guidelines.;Long Term: Cholesterol controlled with medications as prescribed, with individualized exercise RX and with personalized nutrition plan. Value goals: LDL < 70mg , HDL > 40 mg.   Stress Yes   Intervention Offer individual and/or small group education and counseling on adjustment to heart disease, stress management and health-related lifestyle change. Teach and support self-help strategies.;Refer participants experiencing significant psychosocial distress to appropriate mental health specialists for further evaluation and treatment. When possible, include family members and significant others in education/counseling sessions.   Expected Outcomes Short Term: Participant demonstrates changes in health-related behavior, relaxation and other stress management skills, ability to obtain effective social support, and compliance with psychotropic medications if prescribed.;Long Term: Emotional wellbeing is indicated by absence of clinically significant psychosocial distress or social isolation.      Core Components/Risk Factors/Patient Goals Review:    Core Components/Risk Factors/Patient Goals at Discharge (Final Review):    ITP Comments:     ITP Comments    Row Name 10/12/15 1345           ITP Comments Medical Director- Dr. Fransico Him, MD          Comments: Patient attended orientation from 1330to 1530 to review rules and guidelines for program. Completed 6 minute walk test, Intitial ITP, and exercise prescription.  VSS. Telemetry-sinus rhythm.  Asymptomatic.

## 2015-10-18 ENCOUNTER — Encounter (HOSPITAL_COMMUNITY)
Admission: RE | Admit: 2015-10-18 | Discharge: 2015-10-18 | Disposition: A | Discharge status: Home / Self Care | Payer: Medicare Other | Source: Ambulatory Visit | Attending: Cardiovascular Disease | Admitting: Cardiovascular Disease

## 2015-10-18 DIAGNOSIS — Z955 Presence of coronary angioplasty implant and graft: Secondary | ICD-10-CM

## 2015-10-18 DIAGNOSIS — I213 ST elevation (STEMI) myocardial infarction of unspecified site: Secondary | ICD-10-CM | POA: Diagnosis not present

## 2015-10-18 NOTE — Progress Notes (Signed)
Daily Session Note  Patient Details  Name: Gabrielle [name] MRN: [id number] Date of Birth: 06-03-1945 Referring Provider:   Flowsheet Row CARDIAC REHAB PHASE II ORIENTATION from 10/12/2015 in [place]  Referring Provider  Sherren Mocha, MD      Encounter Date: 10/18/2015  Check In:   Capillary Blood Glucose: No results found for this or any previous visit (from the past 24 hour(s)).   Goals Met:  Exercise tolerated well  Goals Unmet:  Not Applicable  Comments: Pt started cardiac rehab today.  Pt tolerated light exercise without difficulty. VSS, telemetry-Sinus Rhythm, asymptomatic.  Medication list reconciled. Pt denies barriers to medicaiton compliance.  PSYCHOSOCIAL ASSESSMENT:  PHQ-0. Pt exhibits positive coping skills, hopeful outlook with supportive family. No psychosocial needs identified at this time, no psychosocial interventions necessary.    Pt enjoys reading and travelling.   Pt oriented to exercise equipment and routine.    Understanding verbalized. Blood pressures were in the low 100's today at cardiac rehab. [name]'s amlodipine was recently discontinued.Will continue to monitor BP.Barnet Pall, RN,BSN 10/19/2015 9:04 AM  Dr. Fransico Him is Medical Director for Cardiac Rehab at [facility].

## 2015-10-20 ENCOUNTER — Encounter (HOSPITAL_COMMUNITY)
Admission: RE | Admit: 2015-10-20 | Discharge: 2015-10-20 | Disposition: A | Discharge status: Home / Self Care | Payer: Medicare Other | Source: Ambulatory Visit | Attending: Cardiovascular Disease | Admitting: Cardiovascular Disease

## 2015-10-20 DIAGNOSIS — I213 ST elevation (STEMI) myocardial infarction of unspecified site: Secondary | ICD-10-CM | POA: Diagnosis not present

## 2015-10-20 DIAGNOSIS — Z955 Presence of coronary angioplasty implant and graft: Secondary | ICD-10-CM | POA: Diagnosis not present

## 2015-10-20 NOTE — Progress Notes (Signed)
Reviewed home exercise guidelines with patient including endpoints, temperature precautions, target heart rate and rate of perceived exertion. Pt is currently walking 30 minutes 2-3 days/week as her mode of home exercise. [facility] of cardiac rehab. Pt voices understanding of instructions given. Sol Passer, MS, ACSM CCEP

## 2015-10-22 ENCOUNTER — Encounter (HOSPITAL_COMMUNITY)
Admission: RE | Admit: 2015-10-22 | Discharge: 2015-10-22 | Disposition: A | Discharge status: Home / Self Care | Payer: Medicare Other | Source: Ambulatory Visit | Attending: Cardiovascular Disease | Admitting: Cardiovascular Disease

## 2015-10-22 DIAGNOSIS — Z955 Presence of coronary angioplasty implant and graft: Secondary | ICD-10-CM

## 2015-10-22 DIAGNOSIS — I213 ST elevation (STEMI) myocardial infarction of unspecified site: Secondary | ICD-10-CM | POA: Diagnosis not present

## 2015-10-25 ENCOUNTER — Encounter (HOSPITAL_COMMUNITY)
Admission: RE | Admit: 2015-10-25 | Discharge: 2015-10-25 | Disposition: A | Discharge status: Home / Self Care | Payer: Medicare Other | Source: Ambulatory Visit | Attending: Cardiovascular Disease | Admitting: Cardiovascular Disease

## 2015-10-25 DIAGNOSIS — Z955 Presence of coronary angioplasty implant and graft: Secondary | ICD-10-CM | POA: Diagnosis not present

## 2015-10-25 DIAGNOSIS — I213 ST elevation (STEMI) myocardial infarction of unspecified site: Secondary | ICD-10-CM | POA: Diagnosis not present

## 2015-10-27 ENCOUNTER — Encounter (HOSPITAL_COMMUNITY)
Admission: RE | Admit: 2015-10-27 | Discharge: 2015-10-27 | Disposition: A | Discharge status: Home / Self Care | Payer: Medicare Other | Source: Ambulatory Visit | Attending: Cardiovascular Disease | Admitting: Cardiovascular Disease

## 2015-10-27 DIAGNOSIS — Z955 Presence of coronary angioplasty implant and graft: Secondary | ICD-10-CM

## 2015-10-27 DIAGNOSIS — I213 ST elevation (STEMI) myocardial infarction of unspecified site: Secondary | ICD-10-CM | POA: Diagnosis not present

## 2015-10-29 ENCOUNTER — Encounter (HOSPITAL_COMMUNITY)
Admission: RE | Admit: 2015-10-29 | Discharge: 2015-10-29 | Disposition: A | Discharge status: Home / Self Care | Payer: Medicare Other | Source: Ambulatory Visit | Attending: Cardiovascular Disease | Admitting: Cardiovascular Disease

## 2015-10-29 DIAGNOSIS — I213 ST elevation (STEMI) myocardial infarction of unspecified site: Secondary | ICD-10-CM

## 2015-10-29 DIAGNOSIS — Z955 Presence of coronary angioplasty implant and graft: Secondary | ICD-10-CM

## 2015-10-29 NOTE — Progress Notes (Signed)
Gabrielle [name] 70 y.o. female Nutrition Note Spoke with pt. Pt wants to lose wt. Pt states she has lost 8-9 lb since her MI, but wt loss has slowed since starting rehab. Nutrition Survey reviewed with pt. Pt is following Step 2 of the Therapeutic Lifestyle Changes diet. Pt expressed understanding of the information reviewed. Pt aware of nutrition education classes offered and plans on attending nutrition classes. No results found for: HGBA1C Wt Readings from Last 3 Encounters:  10/14/15 169 lb 9.6 oz (76.9 kg)  10/12/15 171 lb 1.2 oz (77.6 kg)  08/31/15 172 lb 1.9 oz (78.1 kg)   Nutrition Diagnosis ? Food-and nutrition-related knowledge deficit related to lack of exposure to information as related to diagnosis of: ? CVD ? Obesity related to excessive energy intake as evidenced by a BMI of 33.1 Nutrition Intervention ? Benefits of adopting Therapeutic Lifestyle Changes discussed when Medficts reviewed. ? Pt to attend the Portion Distortion class ? Pt to attend the  ? Nutrition I class                      ? Nutrition II class ? Continue client-centered nutrition education by RD, as part of interdisciplinary care.  Goal(s) ? Pt to identify food quantities necessary to achieve weight loss of 6-24 lb (2.7-10.9 kg) at graduation from cardiac rehab.   Monitor and Evaluate progress toward nutrition goal with team.  Derek Mound, M.Ed, RD, LDN, CDE 10/29/2015 1:58 PM

## 2015-11-01 ENCOUNTER — Encounter (HOSPITAL_COMMUNITY)
Admission: RE | Admit: 2015-11-01 | Discharge: 2015-11-01 | Disposition: A | Discharge status: Home / Self Care | Payer: Medicare Other | Source: Ambulatory Visit | Attending: Cardiovascular Disease | Admitting: Cardiovascular Disease

## 2015-11-01 DIAGNOSIS — I213 ST elevation (STEMI) myocardial infarction of unspecified site: Secondary | ICD-10-CM | POA: Diagnosis not present

## 2015-11-01 DIAGNOSIS — Z955 Presence of coronary angioplasty implant and graft: Secondary | ICD-10-CM | POA: Diagnosis not present

## 2015-11-03 ENCOUNTER — Encounter (HOSPITAL_COMMUNITY)
Admission: RE | Admit: 2015-11-03 | Discharge: 2015-11-03 | Disposition: A | Discharge status: Home / Self Care | Payer: Medicare Other | Source: Ambulatory Visit | Attending: Cardiovascular Disease | Admitting: Cardiovascular Disease

## 2015-11-03 DIAGNOSIS — Z955 Presence of coronary angioplasty implant and graft: Secondary | ICD-10-CM

## 2015-11-03 DIAGNOSIS — I213 ST elevation (STEMI) myocardial infarction of unspecified site: Secondary | ICD-10-CM | POA: Diagnosis not present

## 2015-11-05 ENCOUNTER — Encounter (HOSPITAL_COMMUNITY)
Admission: RE | Admit: 2015-11-05 | Discharge: 2015-11-05 | Disposition: A | Discharge status: Home / Self Care | Payer: Medicare Other | Source: Ambulatory Visit | Attending: Cardiovascular Disease | Admitting: Cardiovascular Disease

## 2015-11-05 DIAGNOSIS — I213 ST elevation (STEMI) myocardial infarction of unspecified site: Secondary | ICD-10-CM | POA: Diagnosis not present

## 2015-11-05 DIAGNOSIS — Z955 Presence of coronary angioplasty implant and graft: Secondary | ICD-10-CM | POA: Diagnosis not present

## 2015-11-08 ENCOUNTER — Encounter (HOSPITAL_COMMUNITY)
Admission: RE | Admit: 2015-11-08 | Discharge: 2015-11-08 | Disposition: A | Discharge status: Home / Self Care | Payer: Medicare Other | Source: Ambulatory Visit | Attending: Cardiovascular Disease | Admitting: Cardiovascular Disease

## 2015-11-08 DIAGNOSIS — I213 ST elevation (STEMI) myocardial infarction of unspecified site: Secondary | ICD-10-CM | POA: Diagnosis not present

## 2015-11-08 DIAGNOSIS — Z955 Presence of coronary angioplasty implant and graft: Secondary | ICD-10-CM

## 2015-11-10 ENCOUNTER — Encounter (HOSPITAL_COMMUNITY)
Admission: RE | Admit: 2015-11-10 | Discharge: 2015-11-10 | Disposition: A | Discharge status: Home / Self Care | Payer: Medicare Other | Source: Ambulatory Visit | Attending: Cardiovascular Disease | Admitting: Cardiovascular Disease

## 2015-11-10 DIAGNOSIS — I213 ST elevation (STEMI) myocardial infarction of unspecified site: Secondary | ICD-10-CM | POA: Diagnosis not present

## 2015-11-10 DIAGNOSIS — Z955 Presence of coronary angioplasty implant and graft: Secondary | ICD-10-CM | POA: Diagnosis not present

## 2015-11-11 NOTE — Progress Notes (Signed)
Cardiac Individual Treatment Plan  Patient Details  Name: Gabrielle [name] MRN: [id number] Date of Birth: 08-May-1945 Referring Provider:   Flowsheet Row CARDIAC REHAB PHASE II ORIENTATION from 10/12/2015 in [facility]  Referring Provider  Sherren Mocha, MD      Initial Encounter Date:  [place] PHASE II ORIENTATION from 10/12/2015 in [facility]  Date  10/12/15  Referring Provider  Sherren Mocha, MD      Visit Diagnosis: ST elevation myocardial infarction (STEMI), unspecified artery ([facility])  S/P drug eluting coronary stent placement  Patient's Home Medications on Admission:  Current Outpatient Prescriptions:  .  acetaminophen (TYLENOL) 500 MG tablet, Take 1,500 mg by mouth daily. , Disp: , Rfl:  .  aspirin EC 81 MG tablet, Take 81 mg by mouth at bedtime. , Disp: , Rfl:  .  benazepril (LOTENSIN) 20 MG tablet, Take 20 mg by mouth 2 (two) times daily. , Disp: , Rfl:  .  BIOTIN PO, Take 1 tablet by mouth at bedtime. , Disp: , Rfl:  .  Calcium Carbonate-Vitamin D (CALCIUM 600 + D PO), Take 1 tablet by mouth 2 (two) times daily.  , Disp: , Rfl:  .  cholecalciferol (VITAMIN D) 1000 units tablet, Take 1,000 Units by mouth daily., Disp: , Rfl:  .  docusate sodium (COLACE) 100 MG capsule, Take 100 mg by mouth daily. , Disp: , Rfl:  .  fish oil-omega-3 fatty acids 1000 MG capsule, Take 1 g by mouth at bedtime. , Disp: , Rfl:  .  gabapentin (NEURONTIN) 300 MG capsule, Take 300 mg by mouth 2 (two) times daily., Disp: , Rfl: 1 .  glucosamine-chondroitin 500-400 MG tablet, Take 2 tablets by mouth daily with breakfast., Disp: , Rfl:  .  levothyroxine (SYNTHROID, LEVOTHROID) 75 MCG tablet, Take 75 mcg by mouth daily before breakfast. , Disp: , Rfl:  .  loratadine-pseudoephedrine (CLARITIN-D 24-HOUR) 10-240 MG per 24 hr tablet, Take 1 tablet by mouth at bedtime. , Disp: , Rfl:  .  Melatonin 3 MG TABS, Take 6 mg by  mouth at bedtime. , Disp: , Rfl:  .  metoprolol tartrate (LOPRESSOR) 25 MG tablet, Take 0.5 tablets (12.5 mg total) by mouth 2 (two) times daily., Disp: 90 tablet, Rfl: 1 .  nitroGLYCERIN (NITROSTAT) 0.4 MG SL tablet, Place 1 tablet (0.4 mg total) under the tongue every 5 (five) minutes x 3 doses as needed for chest pain., Disp: 25 tablet, Rfl: 2 .  omeprazole (PRILOSEC) 20 MG capsule, Take 20 mg by mouth daily with breakfast. , Disp: , Rfl:  .  polyethylene glycol (MIRALAX / GLYCOLAX) packet, Take 17 g by mouth daily as needed (for constipation). Mix with 8 oz liquid and drink, Disp: , Rfl:  .  potassium chloride (K-DUR,KLOR-CON) 10 MEQ tablet, Take 10 mEq by mouth daily. , Disp: , Rfl:  .  rosuvastatin (CRESTOR) 20 MG tablet, Take 1 tablet (20 mg total) by mouth at bedtime., Disp: 30 tablet, Rfl: 12 .  ticagrelor (BRILINTA) 90 MG TABS tablet, Take 1 tablet (90 mg total) by mouth 2 (two) times daily., Disp: 60 tablet, Rfl: 12 .  triamterene-hydrochlorothiazide (MAXZIDE-25) 37.5-25 MG tablet, Take 1 tablet by mouth daily., Disp: , Rfl: 1  Past Medical History: Past Medical History:  Diagnosis Date  . Arthritis 01-17-11   degenerative disc disease-all joints  . Cholesterol serum elevated 01-17-11   tx. meds  . GERD (gastroesophageal reflux disease) 01-17-11  tx. omeprazole  . Heart murmur    slight murmur  . Hypertension 01-17-11   tx. meds  . Seasonal allergies   . Sinus problem 01-17-11   tx. Claritin D  . ST elevation (STEMI) myocardial infarction involving left circumflex coronary artery ([facility]) 08/21/2015    Tobacco Use: History  Smoking Status  . Former Smoker  . Packs/day: 1.00  . Years: 19.00  . Types: Cigarettes  . Quit date: 08/14/1983  Smokeless Tobacco  . Never Used    Labs: Recent Review Flowsheet Data    Labs for ITP Cardiac and Pulmonary Rehab Latest Ref Rng & Units 08/21/2015 08/22/2015 10/12/2015   Cholestrol 125 - 200 mg/dL - 176 180   LDLCALC <130 mg/dL - 82  78   HDL >=46 mg/dL - 63 74   Trlycerides <150 mg/dL - 153(H) 138   TCO2 0 - 100 mmol/L 22 - -      Capillary Blood Glucose: Lab Results  Component Value Date   GLUCAP 100 (H) 08/23/2015     Exercise Target Goals:    Exercise Program Goal: Individual exercise prescription set with THRR, safety & activity barriers. Participant demonstrates ability to understand and report RPE using BORG scale, to self-measure pulse accurately, and to acknowledge the importance of the exercise prescription.  Exercise Prescription Goal: Starting with aerobic activity 30 plus minutes a day, 3 days per week for initial exercise prescription. Provide home exercise prescription and guidelines that participant acknowledges understanding prior to discharge.  Activity Barriers & Risk Stratification:     Activity Barriers & Cardiac Risk Stratification - 10/12/15 1447      Activity Barriers & Cardiac Risk Stratification   Activity Barriers Arthritis;Back Problems;Left Knee Replacement;Right Knee Replacement;Joint Problems;History of Falls;Assistive Device   Cardiac Risk Stratification High      6 Minute Walk:     6 Minute Walk    Row Name 10/12/15 1653         6 Minute Walk   Phase Initial     Distance 1419 feet     Walk Time 6 minutes     # of Rest Breaks 0     MPH 2.69     METS 2.75     RPE 11     VO2 Peak 9.61     Symptoms Yes (comment)     Comments Pt c/o fatigue at end of walk, resolved with rest.     Resting HR 81 bpm     Resting BP 111/45     Max Ex. HR 102 bpm     Max Ex. BP 124/60     2 Minute Post BP 104/60        Initial Exercise Prescription:     Initial Exercise Prescription - 10/15/15 1700      Date of Initial Exercise RX and Referring Provider   Date 10/12/15   Referring Provider Sherren Mocha, MD     Bike   Level 0.7   Minutes 10   METs 2.71     NuStep   Level 2   Minutes 10   METs 2.5     Track   Laps 8   Minutes 10   METs 2.39      Prescription Details   Frequency (times per week) 3   Duration Progress to 30 minutes of continuous aerobic without signs/symptoms of physical distress     Intensity   THRR 40-80% of Max Heartrate 60-121   Ratings of Perceived Exertion 11-13  Perceived Dyspnea 0-4     Progression   Progression Continue to progress workloads to maintain intensity without signs/symptoms of physical distress.     Resistance Training   Training Prescription Yes   Weight 1lbs   Reps 10-12      Perform Capillary Blood Glucose checks as needed.  Exercise Prescription Changes:     Exercise Prescription Changes    Row Name 11/09/15 0900             Exercise Review   Progression Yes         Response to Exercise   Blood Pressure (Admit) 110/60       Blood Pressure (Exercise) 128/80       Blood Pressure (Exit) 100/68       Heart Rate (Admit) 72 bpm       Heart Rate (Exercise) 113 bpm       Heart Rate (Exit) 81 bpm       Rating of Perceived Exertion (Exercise) 12       Symptoms none       Comments Home exercise guidelines reviewed on  10/20/15.       Duration Progress to 30 minutes of continuous aerobic without signs/symptoms of physical distress       Intensity THRR unchanged         Progression   Progression Continue to progress workloads to maintain intensity without signs/symptoms of physical distress.       Average METs 3.4         Resistance Training   Training Prescription Yes       Weight 4lbs       Reps 10-12         Interval Training   Interval Training No         Bike   Level -       Minutes -       METs -         Recumbant Bike   Level 2       Watts 33       Minutes 10       METs 2.7         NuStep   Level 5       Minutes 10       METs 4.1         Track   Laps 14       Minutes 10       METs 3.43         Home Exercise Plan   Plans to continue exercise at [employer] [employer] (comment)       Frequency Add 3 additional days to program exercise sessions.           Exercise Comments:     Exercise Comments    Row Name 10/20/15 1226 11/09/15 0951         Exercise Comments Participant is walkng 30 minutes, 2-3 days per week. Doing well with exercise.         Discharge Exercise Prescription (Final Exercise Prescription Changes):     Exercise Prescription Changes - 11/09/15 0900      Exercise Review   Progression Yes     Response to Exercise   Blood Pressure (Admit) 110/60   Blood Pressure (Exercise) 128/80   Blood Pressure (Exit) 100/68   Heart Rate (Admit) 72 bpm   Heart Rate (Exercise) 113 bpm   Heart Rate (Exit) 81 bpm   Rating of Perceived Exertion (  Exercise) 12   Symptoms none   Comments Home exercise guidelines reviewed on  10/20/15.   Duration Progress to 30 minutes of continuous aerobic without signs/symptoms of physical distress   Intensity THRR unchanged     Progression   Progression Continue to progress workloads to maintain intensity without signs/symptoms of physical distress.   Average METs 3.4     Resistance Training   Training Prescription Yes   Weight 4lbs   Reps 10-12     Interval Training   Interval Training No     Bike   Level --   Minutes --   METs --     Recumbant Bike   Level 2   Watts 33   Minutes 10   METs 2.7     NuStep   Level 5   Minutes 10   METs 4.1     Track   Laps 14   Minutes 10   METs 3.43     Home Exercise Plan   Plans to continue exercise at [employer] (comment)   Frequency Add 3 additional days to program exercise sessions.      Nutrition:  Target Goals: Understanding of nutrition guidelines, daily intake of sodium '1500mg'$ , cholesterol '200mg'$ , calories 30% from fat and 7% or less from saturated fats, daily to have 5 or more servings of fruits and vegetables.  Biometrics:     Pre Biometrics - 10/12/15 1415      Pre Biometrics   Height 5' 0.5" (1.537 m)   Weight 171 lb 1.2 oz (77.6 kg)   Waist Circumference 38 inches   Hip Circumference 45 inches    Waist to Hip Ratio 0.84 %   BMI (Calculated) 32.9   Triceps Skinfold 40 mm   % Body Fat 45.5 %   Grip Strength 26 kg   Flexibility 13.5 in   Single Leg Stand 30 seconds       Nutrition Therapy Plan and Nutrition Goals:     Nutrition Therapy & Goals - 10/14/15 0932      Nutrition Therapy   Diet Therapeutic Lifestyle Changes     Personal Nutrition Goals   Personal Goal #1 1-2 lb wt loss/week to a goal wt loss of 10-15 lb at graduation from [place], educate and counsel regarding individualized specific dietary modifications aiming towards targeted core components such as weight, hypertension, lipid management, diabetes, heart failure and other comorbidities.   Expected Outcomes Short Term Goal: Understand basic principles of dietary content, such as calories, fat, sodium, cholesterol and nutrients.;Long Term Goal: Adherence to prescribed nutrition plan.      Nutrition Discharge: Nutrition Scores:     Nutrition Assessments - 10/29/15 1401      MEDFICTS Scores   Pre Score 18      Nutrition Goals Re-Evaluation:   Psychosocial: Target Goals: Acknowledge presence or absence of depression, maximize coping skills, provide positive support system. Participant is able to verbalize types and ability to use techniques and skills needed for reducing stress and depression.  Initial Review & Psychosocial Screening:     Initial Psych Review & Screening - 10/12/15 1523      Initial Review   Current issues with Current Stress Concerns     Family Dynamics   Good Support System? Yes     Barriers   Psychosocial barriers to participate in program The patient should benefit from training in stress management and relaxation.  Screening Interventions   Interventions Encouraged to exercise      Quality of Life Scores:     Quality of Life - 10/15/15 1710      Quality of Life Scores   Health/Function Pre 29.6 %    Socioeconomic Pre 28.21 %   Psych/Spiritual Pre 28.79 %   Family Pre 30 %   GLOBAL Pre 29.21 %      PHQ-9: Recent Review Flowsheet Data    Depression screen [facility] 2/9 10/18/2015   Decreased Interest 0   Down, Depressed, Hopeless 0   PHQ - 2 Score 0      Psychosocial Evaluation and Intervention:     Psychosocial Evaluation - 11/11/15 1732      Psychosocial Evaluation & Interventions   Interventions Encouraged to exercise with the program and follow exercise prescription   Comments no pyschosocial needs identified no interventions needed.      Psychosocial Re-Evaluation:   Vocational Rehabilitation: Provide vocational rehab assistance to qualifying candidates.   Vocational Rehab Evaluation & Intervention:     Vocational Rehab - 10/12/15 1517      Initial Vocational Rehab Evaluation & Intervention   Assessment shows need for Vocational Rehabilitation No      Education: Education Goals: Education classes will be provided on a weekly basis, covering required topics. Participant will state understanding/return demonstration of topics presented.  Learning Barriers/Preferences:     Learning Barriers/Preferences - 10/15/15 1711      Learning Barriers/Preferences   Learning Barriers None   Learning Preferences Written Material;Video;Pictoral      Education Topics: Count Your Pulse:  -Group instruction provided by verbal instruction, demonstration, patient participation and written materials to support subject.  Instructors address importance of being able to find your pulse and how to count your pulse when at home without a heart monitor.  Patients get hands on experience counting their pulse with staff help and individually.   Heart Attack, Angina, and Risk Factor Modification:  -Group instruction provided by verbal instruction, video, and written materials to support subject.  Instructors address signs and symptoms of angina and heart attacks.    Also discuss risk  factors for heart disease and how to make changes to improve heart health risk factors.   Functional Fitness:  -Group instruction provided by verbal instruction, demonstration, patient participation, and written materials to support subject.  Instructors address safety measures for doing things around the house.  Discuss how to get up and down off the floor, how to pick things up properly, how to safely get out of a chair without assistance, and balance training.   Meditation and Mindfulness:  -Group instruction provided by verbal instruction, patient participation, and written materials to support subject.  Instructor addresses importance of mindfulness and meditation practice to help reduce stress and improve awareness.  Instructor also leads participants through a meditation exercise.    Stretching for Flexibility and Mobility:  -Group instruction provided by verbal instruction, patient participation, and written materials to support subject.  Instructors lead participants through series of stretches that are designed to increase flexibility thus improving mobility.  These stretches are additional exercise for major muscle groups that are typically performed during regular warm up and cool down. Flowsheet Row CARDIAC REHAB PHASE II EXERCISE from 11/10/2015 in [facility]  Date  10/29/15  Instruction Review Code  2- meets goals/outcomes      Hands Only CPR Anytime:  -Group instruction provided by verbal instruction, video, patient participation and  written materials to support subject.  Instructors co-teach with [facility] video for hands only CPR.  Participants get hands on experience with mannequins.   Nutrition I class: Heart Healthy Eating:  -Group instruction provided by PowerPoint slides, verbal discussion, and written materials to support subject matter. The instructor gives an explanation and review of the Therapeutic Lifestyle Changes diet recommendations,  which includes a discussion on lipid goals, dietary fat, sodium, fiber, plant stanol/sterol esters, sugar, and the components of a well-balanced, healthy diet.   Nutrition II class: Lifestyle Skills:  -Group instruction provided by PowerPoint slides, verbal discussion, and written materials to support subject matter. The instructor gives an explanation and review of label reading, grocery shopping for heart health, heart healthy recipe modifications, and ways to make healthier choices when eating out.   Diabetes Question & Answer:  -Group instruction provided by PowerPoint slides, verbal discussion, and written materials to support subject matter. The instructor gives an explanation and review of diabetes co-morbidities, pre- and post-prandial blood glucose goals, pre-exercise blood glucose goals, signs, symptoms, and treatment of hypoglycemia and hyperglycemia, and foot care basics.   Diabetes Blitz:  -Group instruction provided by PowerPoint slides, verbal discussion, and written materials to support subject matter. The instructor gives an explanation and review of the physiology behind type 1 and type 2 diabetes, diabetes medications and rational behind using different medications, pre- and post-prandial blood glucose recommendations and Hemoglobin A1c goals, diabetes diet, and exercise including blood glucose guidelines for exercising safely.    Portion Distortion:  -Group instruction provided by PowerPoint slides, verbal discussion, written materials, and food models to support subject matter. The instructor gives an explanation of serving size versus portion size, changes in portions sizes over the last 20 years, and what consists of a serving from each food group. Flowsheet Row CARDIAC REHAB PHASE II EXERCISE from 11/10/2015 in [facility] [place]  Date  11/03/15  Educator  RD  Instruction Review Code  2- meets goals/outcomes      Stress Management:  -Group  instruction provided by verbal instruction, video, and written materials to support subject matter.  Instructors review role of stress in heart disease and how to cope with stress positively.   Flowsheet Row CARDIAC REHAB PHASE II EXERCISE from 11/10/2015 in [place]  Date  10/27/15  Instruction Review Code  2- meets goals/outcomes      Exercising on Your Own:  -Group instruction provided by verbal instruction, power point, and written materials to support subject.  Instructors discuss benefits of exercise, components of exercise, frequency and intensity of exercise, and end points for exercise.  Also discuss use of nitroglycerin and activating EMS.  Review options of places to exercise outside of rehab.  Review guidelines for sex with heart disease. Flowsheet Row CARDIAC REHAB PHASE II EXERCISE from 11/10/2015 in [facility]  Date  10/20/15  Educator  Seward Carol  Instruction Review Code  2- meets goals/outcomes      Cardiac Drugs I:  -Group instruction provided by verbal instruction and written materials to support subject.  Instructor reviews cardiac drug classes: antiplatelets, anticoagulants, beta blockers, and statins.  Instructor discusses reasons, side effects, and lifestyle considerations for each drug class. Flowsheet Row CARDIAC REHAB PHASE II EXERCISE from 11/10/2015 in [place]  Date  11/10/15  Educator  pharmacist  Instruction Review Code  2- meets goals/outcomes      Cardiac Drugs II:  -  Group instruction provided by verbal instruction and written materials to support subject.  Instructor reviews cardiac drug classes: angiotensin converting enzyme inhibitors (ACE-I), angiotensin II receptor blockers (ARBs), nitrates, and calcium channel blockers.  Instructor discusses reasons, side effects, and lifestyle considerations for each drug class.   Anatomy and Physiology of the  Circulatory System:  -Group instruction provided by verbal instruction, video, and written materials to support subject.  Reviews functional anatomy of heart, how it relates to various diagnoses, and what role the heart plays in the overall system.   Knowledge Questionnaire Score:     Knowledge Questionnaire Score - 10/15/15 1709      Knowledge Questionnaire Score   Pre Score 22/24      Core Components/Risk Factors/Patient Goals at Admission:     Personal Goals and Risk Factors at Admission - 10/12/15 1522      Core Components/Risk Factors/Patient Goals on Admission    Weight Management Weight Loss;Yes   Intervention Weight Management: Develop a combined nutrition and exercise program designed to reach desired caloric intake, while maintaining appropriate intake of nutrient and fiber, sodium and fats, and appropriate energy expenditure required for the weight goal.;Weight Management: Provide education and appropriate resources to help participant work on and attain dietary goals.;Weight Management/Obesity: Establish reasonable short term and long term weight goals.   Expected Outcomes Short Term: Continue to assess and modify interventions until short term weight is achieved;Long Term: Adherence to nutrition and physical activity/exercise program aimed toward attainment of established weight goal   Hypertension Yes   Intervention Provide education on lifestyle modifcations including regular physical activity/exercise, weight management, moderate sodium restriction and increased consumption of fresh fruit, vegetables, and low fat dairy, alcohol moderation, and smoking cessation.;Monitor prescription use compliance.   Expected Outcomes Short Term: Continued assessment and intervention until BP is < 140/2m HG in hypertensive participants. < 130/818mHG in hypertensive participants with diabetes, heart failure or chronic kidney disease.;Long Term: Maintenance of blood pressure at goal levels.    Lipids Yes   Intervention Provide education and support for participant on nutrition & aerobic/resistive exercise along with prescribed medications to achieve LDL '70mg'$ , HDL >'40mg'$ .   Expected Outcomes Short Term: Participant states understanding of desired cholesterol values and is compliant with medications prescribed. Participant is following exercise prescription and nutrition guidelines.;Long Term: Cholesterol controlled with medications as prescribed, with individualized exercise RX and with personalized nutrition plan. Value goals: LDL < '70mg'$ , HDL > 40 mg.   Stress Yes   Intervention Offer individual and/or small group education and counseling on adjustment to heart disease, stress management and health-related lifestyle change. Teach and support self-help strategies.;Refer participants experiencing significant psychosocial distress to appropriate mental health specialists for further evaluation and treatment. When possible, include family members and significant others in education/counseling sessions.   Expected Outcomes Short Term: Participant demonstrates changes in health-related behavior, relaxation and other stress management skills, ability to obtain effective social support, and compliance with psychotropic medications if prescribed.;Long Term: Emotional wellbeing is indicated by absence of clinically significant psychosocial distress or social isolation.      Core Components/Risk Factors/Patient Goals Review:      Goals and Risk Factor Review    Row Name 11/09/15 [id number]           Core Components/Risk Factors/Patient Goals Review   Personal Goals Review Weight Management/Obesity;Other       Review Participant has established a regular exercise routine. She is walking 2-3 days per week at home in addition  to attending cardiac rehab. Weight is down 2.2lbs since orientation.       Expected Outcomes [facility].          Core Components/Risk Factors/Patient Goals at Discharge (Final Review):      Goals and Risk Factor Review - 11/09/15 0952      Core Components/Risk Factors/Patient Goals Review   Personal Goals Review Weight Management/Obesity;Other   Review Participant has established a regular exercise routine. She is walking 2-3 days per week at home in addition to attending cardiac rehab. Weight is down 2.2lbs since orientation.   Expected Outcomes [facility] to [facility] in [facility].      ITP Comments:     ITP Comments    Row Name 10/12/15 1345 11/05/15 1135         ITP Comments Medical Director- Dr. Fransico Him, MD attended Hypertension education video/lecture class, met outcomes/goals 10/6         Comments:  Pt is making expected progress toward personal goals after completing 12 sessions. Pt is doing well in cardiac rehab.  No psychosocial needs identified, no further intervention required. Recommend continued exercise and life style modification education including  stress management and relaxation techniques to decrease cardiac risk profile.Cherre Huger, BSN

## 2015-11-12 ENCOUNTER — Encounter (HOSPITAL_COMMUNITY)
Admission: RE | Admit: 2015-11-12 | Discharge: 2015-11-12 | Disposition: A | Discharge status: Home / Self Care | Payer: Medicare Other | Source: Ambulatory Visit | Attending: Cardiovascular Disease | Admitting: Cardiovascular Disease

## 2015-11-12 DIAGNOSIS — Z955 Presence of coronary angioplasty implant and graft: Secondary | ICD-10-CM | POA: Diagnosis not present

## 2015-11-12 DIAGNOSIS — I213 ST elevation (STEMI) myocardial infarction of unspecified site: Secondary | ICD-10-CM

## 2015-11-15 ENCOUNTER — Encounter (HOSPITAL_COMMUNITY)
Admission: RE | Admit: 2015-11-15 | Discharge: 2015-11-15 | Disposition: A | Discharge status: Home / Self Care | Payer: Medicare Other | Source: Ambulatory Visit | Attending: Cardiovascular Disease | Admitting: Cardiovascular Disease

## 2015-11-15 DIAGNOSIS — I213 ST elevation (STEMI) myocardial infarction of unspecified site: Secondary | ICD-10-CM

## 2015-11-15 DIAGNOSIS — Z955 Presence of coronary angioplasty implant and graft: Secondary | ICD-10-CM

## 2015-11-17 ENCOUNTER — Encounter (HOSPITAL_COMMUNITY): Payer: Medicare Other

## 2015-11-19 ENCOUNTER — Encounter (HOSPITAL_COMMUNITY)
Admission: RE | Admit: 2015-11-19 | Discharge: 2015-11-19 | Disposition: A | Discharge status: Home / Self Care | Payer: Medicare Other | Source: Ambulatory Visit | Attending: Cardiovascular Disease | Admitting: Cardiovascular Disease

## 2015-11-19 DIAGNOSIS — I213 ST elevation (STEMI) myocardial infarction of unspecified site: Secondary | ICD-10-CM

## 2015-11-19 DIAGNOSIS — Z955 Presence of coronary angioplasty implant and graft: Secondary | ICD-10-CM

## 2015-11-19 DIAGNOSIS — Z23 Encounter for immunization: Secondary | ICD-10-CM | POA: Diagnosis not present

## 2015-11-22 ENCOUNTER — Encounter (HOSPITAL_COMMUNITY)
Admission: RE | Admit: 2015-11-22 | Discharge: 2015-11-22 | Disposition: A | Discharge status: Home / Self Care | Payer: Medicare Other | Source: Ambulatory Visit | Attending: Cardiovascular Disease | Admitting: Cardiovascular Disease

## 2015-11-22 DIAGNOSIS — Z955 Presence of coronary angioplasty implant and graft: Secondary | ICD-10-CM

## 2015-11-22 DIAGNOSIS — I213 ST elevation (STEMI) myocardial infarction of unspecified site: Secondary | ICD-10-CM | POA: Diagnosis not present

## 2015-11-24 ENCOUNTER — Encounter (HOSPITAL_COMMUNITY)
Admission: RE | Admit: 2015-11-24 | Discharge: 2015-11-24 | Disposition: A | Discharge status: Home / Self Care | Payer: Medicare Other | Source: Ambulatory Visit | Attending: Cardiovascular Disease | Admitting: Cardiovascular Disease

## 2015-11-24 DIAGNOSIS — I213 ST elevation (STEMI) myocardial infarction of unspecified site: Secondary | ICD-10-CM

## 2015-11-24 DIAGNOSIS — Z955 Presence of coronary angioplasty implant and graft: Secondary | ICD-10-CM | POA: Diagnosis not present

## 2015-11-26 ENCOUNTER — Encounter (HOSPITAL_COMMUNITY)
Admission: RE | Admit: 2015-11-26 | Discharge: 2015-11-26 | Disposition: A | Discharge status: Home / Self Care | Payer: Medicare Other | Source: Ambulatory Visit | Attending: Cardiovascular Disease | Admitting: Cardiovascular Disease

## 2015-11-26 DIAGNOSIS — I213 ST elevation (STEMI) myocardial infarction of unspecified site: Secondary | ICD-10-CM | POA: Diagnosis not present

## 2015-11-26 DIAGNOSIS — Z955 Presence of coronary angioplasty implant and graft: Secondary | ICD-10-CM

## 2015-11-29 ENCOUNTER — Encounter (HOSPITAL_COMMUNITY)
Admission: RE | Admit: 2015-11-29 | Discharge: 2015-11-29 | Disposition: A | Discharge status: Home / Self Care | Payer: Medicare Other | Source: Ambulatory Visit | Attending: Cardiovascular Disease | Admitting: Cardiovascular Disease

## 2015-11-29 DIAGNOSIS — I213 ST elevation (STEMI) myocardial infarction of unspecified site: Secondary | ICD-10-CM

## 2015-11-29 DIAGNOSIS — Z955 Presence of coronary angioplasty implant and graft: Secondary | ICD-10-CM

## 2015-11-30 DIAGNOSIS — M7061 Trochanteric bursitis, right hip: Secondary | ICD-10-CM | POA: Diagnosis not present

## 2015-12-01 ENCOUNTER — Encounter (HOSPITAL_COMMUNITY)
Admission: RE | Admit: 2015-12-01 | Discharge: 2015-12-01 | Disposition: A | Discharge status: Home / Self Care | Payer: Medicare Other | Source: Ambulatory Visit | Attending: Cardiovascular Disease | Admitting: Cardiovascular Disease

## 2015-12-01 DIAGNOSIS — I213 ST elevation (STEMI) myocardial infarction of unspecified site: Secondary | ICD-10-CM | POA: Diagnosis not present

## 2015-12-01 DIAGNOSIS — Z955 Presence of coronary angioplasty implant and graft: Secondary | ICD-10-CM | POA: Diagnosis not present

## 2015-12-03 ENCOUNTER — Encounter (HOSPITAL_COMMUNITY)
Admission: RE | Admit: 2015-12-03 | Discharge: 2015-12-03 | Disposition: A | Discharge status: Home / Self Care | Payer: Medicare Other | Source: Ambulatory Visit | Attending: Cardiovascular Disease | Admitting: Cardiovascular Disease

## 2015-12-03 DIAGNOSIS — I213 ST elevation (STEMI) myocardial infarction of unspecified site: Secondary | ICD-10-CM | POA: Diagnosis not present

## 2015-12-03 DIAGNOSIS — Z955 Presence of coronary angioplasty implant and graft: Secondary | ICD-10-CM | POA: Diagnosis not present

## 2015-12-06 ENCOUNTER — Encounter (HOSPITAL_COMMUNITY): Payer: Medicare Other

## 2015-12-06 ENCOUNTER — Encounter (HOSPITAL_COMMUNITY)
Admission: RE | Admit: 2015-12-06 | Discharge: 2015-12-06 | Disposition: A | Discharge status: Home / Self Care | Payer: Medicare Other | Source: Ambulatory Visit | Attending: Cardiovascular Disease | Admitting: Cardiovascular Disease

## 2015-12-06 DIAGNOSIS — Z955 Presence of coronary angioplasty implant and graft: Secondary | ICD-10-CM | POA: Diagnosis not present

## 2015-12-06 DIAGNOSIS — I213 ST elevation (STEMI) myocardial infarction of unspecified site: Secondary | ICD-10-CM | POA: Diagnosis not present

## 2015-12-08 ENCOUNTER — Encounter (HOSPITAL_COMMUNITY): Payer: Medicare Other

## 2015-12-09 NOTE — Progress Notes (Signed)
Cardiac Individual Treatment Plan  Patient Details  Name: Gabrielle [name] MRN: [id number] Date of Birth: Jul 29, 1945 Referring Provider:   Flowsheet Row CARDIAC REHAB PHASE II ORIENTATION from 10/12/2015 in [place]  Referring Provider  Sherren Mocha, MD      Initial Encounter Date:  [place] PHASE II ORIENTATION from 10/12/2015 in [place]  Date  10/12/15  Referring Provider  Sherren Mocha, MD      Visit Diagnosis: ST elevation myocardial infarction (STEMI), unspecified artery ([place])  S/P drug eluting coronary stent placement  Patient's Home Medications on Admission:  Current Outpatient Prescriptions:  .  acetaminophen (TYLENOL) 500 MG tablet, Take 1,500 mg by mouth daily. , Disp: , Rfl:  .  aspirin EC 81 MG tablet, Take 81 mg by mouth at bedtime. , Disp: , Rfl:  .  benazepril (LOTENSIN) 20 MG tablet, Take 20 mg by mouth 2 (two) times daily. , Disp: , Rfl:  .  BIOTIN PO, Take 1 tablet by mouth at bedtime. , Disp: , Rfl:  .  Calcium Carbonate-Vitamin D (CALCIUM 600 + D PO), Take 1 tablet by mouth 2 (two) times daily.  , Disp: , Rfl:  .  cholecalciferol (VITAMIN D) 1000 units tablet, Take 1,000 Units by mouth daily., Disp: , Rfl:  .  docusate sodium (COLACE) 100 MG capsule, Take 100 mg by mouth daily. , Disp: , Rfl:  .  fish oil-omega-3 fatty acids 1000 MG capsule, Take 1 g by mouth at bedtime. , Disp: , Rfl:  .  gabapentin (NEURONTIN) 300 MG capsule, Take 300 mg by mouth 2 (two) times daily., Disp: , Rfl: 1 .  glucosamine-chondroitin 500-400 MG tablet, Take 2 tablets by mouth daily with breakfast., Disp: , Rfl:  .  levothyroxine (SYNTHROID, LEVOTHROID) 75 MCG tablet, Take 75 mcg by mouth daily before breakfast. , Disp: , Rfl:  .  loratadine-pseudoephedrine (CLARITIN-D 24-HOUR) 10-240 MG per 24 hr tablet, Take 1 tablet by mouth at bedtime. , Disp: , Rfl:  .  Melatonin 3 MG TABS, Take 6 mg by  mouth at bedtime. , Disp: , Rfl:  .  metoprolol tartrate (LOPRESSOR) 25 MG tablet, Take 0.5 tablets (12.5 mg total) by mouth 2 (two) times daily., Disp: 90 tablet, Rfl: 1 .  nitroGLYCERIN (NITROSTAT) 0.4 MG SL tablet, Place 1 tablet (0.4 mg total) under the tongue every 5 (five) minutes x 3 doses as needed for chest pain., Disp: 25 tablet, Rfl: 2 .  omeprazole (PRILOSEC) 20 MG capsule, Take 20 mg by mouth daily with breakfast. , Disp: , Rfl:  .  polyethylene glycol (MIRALAX / GLYCOLAX) packet, Take 17 g by mouth daily as needed (for constipation). Mix with 8 oz liquid and drink, Disp: , Rfl:  .  potassium chloride (K-DUR,KLOR-CON) 10 MEQ tablet, Take 10 mEq by mouth daily. , Disp: , Rfl:  .  rosuvastatin (CRESTOR) 20 MG tablet, Take 1 tablet (20 mg total) by mouth at bedtime., Disp: 30 tablet, Rfl: 12 .  ticagrelor (BRILINTA) 90 MG TABS tablet, Take 1 tablet (90 mg total) by mouth 2 (two) times daily., Disp: 60 tablet, Rfl: 12 .  triamterene-hydrochlorothiazide (MAXZIDE-25) 37.5-25 MG tablet, Take 1 tablet by mouth daily., Disp: , Rfl: 1  Past Medical History: Past Medical History:  Diagnosis Date  . Arthritis 01-17-11   degenerative disc disease-all joints  . Cholesterol serum elevated 01-17-11   tx. meds  . GERD (gastroesophageal reflux disease) 01-17-11  tx. omeprazole  . Heart murmur    slight murmur  . Hypertension 01-17-11   tx. meds  . Seasonal allergies   . Sinus problem 01-17-11   tx. Claritin D  . ST elevation (STEMI) myocardial infarction involving left circumflex coronary artery ([place]) 08/21/2015    Tobacco Use: History  Smoking Status  . Former Smoker  . Packs/day: 1.00  . Years: 19.00  . Types: Cigarettes  . Quit date: 08/14/1983  Smokeless Tobacco  . Never Used    Labs: Recent Review Flowsheet Data    Labs for ITP Cardiac and Pulmonary Rehab Latest Ref Rng & Units 08/21/2015 08/22/2015 10/12/2015   Cholestrol 125 - 200 mg/dL - 176 180   LDLCALC <130 mg/dL - 82  78   HDL >=46 mg/dL - 63 74   Trlycerides <150 mg/dL - 153(H) 138   TCO2 0 - 100 mmol/L 22 - -      Capillary Blood Glucose: Lab Results  Component Value Date   GLUCAP 100 (H) 08/23/2015     Exercise Target Goals:    Exercise Program Goal: Individual exercise prescription set with THRR, safety & activity barriers. Participant demonstrates ability to understand and report RPE using BORG scale, to self-measure pulse accurately, and to acknowledge the importance of the exercise prescription.  Exercise Prescription Goal: Starting with aerobic activity 30 plus minutes a day, 3 days per week for initial exercise prescription. Provide home exercise prescription and guidelines that participant acknowledges understanding prior to discharge.  Activity Barriers & Risk Stratification:     Activity Barriers & Cardiac Risk Stratification - 10/12/15 1447      Activity Barriers & Cardiac Risk Stratification   Activity Barriers Arthritis;Back Problems;Left Knee Replacement;Right Knee Replacement;Joint Problems;History of Falls;Assistive Device   Cardiac Risk Stratification High      6 Minute Walk:     6 Minute Walk    Row Name 10/12/15 1653         6 Minute Walk   Phase Initial     Distance 1419 feet     Walk Time 6 minutes     # of Rest Breaks 0     MPH 2.69     METS 2.75     RPE 11     VO2 Peak 9.61     Symptoms Yes (comment)     Comments Pt c/o fatigue at end of walk, resolved with rest.     Resting HR 81 bpm     Resting BP 111/45     Max Ex. HR 102 bpm     Max Ex. BP 124/60     2 Minute Post BP 104/60        Initial Exercise Prescription:     Initial Exercise Prescription - 10/15/15 1700      Date of Initial Exercise RX and Referring Provider   Date 10/12/15   Referring Provider Sherren Mocha, MD     Bike   Level 0.7   Minutes 10   METs 2.71     NuStep   Level 2   Minutes 10   METs 2.5     Track   Laps 8   Minutes 10   METs 2.39      Prescription Details   Frequency (times per week) 3   Duration Progress to 30 minutes of continuous aerobic without signs/symptoms of physical distress     Intensity   THRR 40-80% of Max Heartrate 60-121   Ratings of Perceived Exertion 11-13  Perceived Dyspnea 0-4     Progression   Progression Continue to progress workloads to maintain intensity without signs/symptoms of physical distress.     Resistance Training   Training Prescription Yes   Weight 1lbs   Reps 10-12      Perform Capillary Blood Glucose checks as needed.  Exercise Prescription Changes:     Exercise Prescription Changes    Row Name 11/09/15 0900 11/24/15 1600 12/07/15 1000         Exercise Review   Progression Yes Yes Yes       Response to Exercise   Blood Pressure (Admit) 110/60 102/62 118/62     Blood Pressure (Exercise) 128/80 142/60 138/68     Blood Pressure (Exit) 100/68 102/70 110/68     Heart Rate (Admit) 72 bpm 79 bpm 78 bpm     Heart Rate (Exercise) 113 bpm 123 bpm 106 bpm     Heart Rate (Exit) 81 bpm 77 bpm 78 bpm     Rating of Perceived Exertion (Exercise) '12 12 12     '$ Symptoms none none none     Comments Home exercise guidelines reviewed on  10/20/15. Home exercise guidelines reviewed on  10/20/15. Home exercise guidelines reviewed on  10/20/15.     Duration Progress to 30 minutes of continuous aerobic without signs/symptoms of physical distress Progress to 30 minutes of continuous aerobic without signs/symptoms of physical distress Progress to 30 minutes of continuous aerobic without signs/symptoms of physical distress     Intensity THRR unchanged THRR unchanged THRR unchanged       Progression   Progression Continue to progress workloads to maintain intensity without signs/symptoms of physical distress. Continue to progress workloads to maintain intensity without signs/symptoms of physical distress. Continue to progress workloads to maintain intensity without signs/symptoms of physical  distress.     Average METs 3.4 3.7 3.8       Resistance Training   Training Prescription Yes Yes Yes     Weight 4lbs 5lbs 5lbs     Reps 10-12 10-12 10-12       Interval Training   Interval Training No No No       Bike   Level -  -  -     Minutes -  -  -     METs -  -  -       Recumbant Bike   Level '2 3 3     '$ Watts 33 -  -     Minutes '10 10 10     '$ METs 2.7 3 3.1       NuStep   Level '5 5 5     '$ Minutes '10 10 10     '$ METs 4.1 5 5.1       Track   Laps '14 12 13     '$ Minutes 10 10 3.26     METs 3.43 3.43 3.43       Home Exercise Plan   Plans to continue exercise at [employer] (comment) Forensic scientist (comment) Forensic scientist (comment)     Frequency Add 3 additional days to program exercise sessions. Add 3 additional days to program exercise sessions. Add 3 additional days to program exercise sessions.        Exercise Comments:     Exercise Comments    Row Name 10/20/15 1226 11/09/15 0951         Exercise Comments Participant is walkng 30 minutes, 2-3 days per week. Doing well with  exercise.         Discharge Exercise Prescription (Final Exercise Prescription Changes):     Exercise Prescription Changes - 12/07/15 1000      Exercise Review   Progression Yes     Response to Exercise   Blood Pressure (Admit) 118/62   Blood Pressure (Exercise) 138/68   Blood Pressure (Exit) 110/68   Heart Rate (Admit) 78 bpm   Heart Rate (Exercise) 106 bpm   Heart Rate (Exit) 78 bpm   Rating of Perceived Exertion (Exercise) 12   Symptoms none   Comments Home exercise guidelines reviewed on  10/20/15.   Duration Progress to 30 minutes of continuous aerobic without signs/symptoms of physical distress   Intensity THRR unchanged     Progression   Progression Continue to progress workloads to maintain intensity without signs/symptoms of physical distress.   Average METs 3.8     Resistance Training   Training Prescription Yes   Weight 5lbs   Reps 10-12      Interval Training   Interval Training No     Recumbant Bike   Level 3   Minutes 10   METs 3.1     NuStep   Level 5   Minutes 10   METs 5.1     Track   Laps 13   Minutes 3.26   METs 3.43     Home Exercise Plan   Plans to continue exercise at [facility] (comment)   Frequency Add 3 additional days to program exercise sessions.      Nutrition:  Target Goals: Understanding of nutrition guidelines, daily intake of sodium '1500mg'$ , cholesterol '200mg'$ , calories 30% from fat and 7% or less from saturated fats, daily to have 5 or more servings of fruits and vegetables.  Biometrics:     Pre Biometrics - 10/12/15 1415      Pre Biometrics   Height 5' 0.5" (1.537 m)   Weight 171 lb 1.2 oz (77.6 kg)   Waist Circumference 38 inches   Hip Circumference 45 inches   Waist to Hip Ratio 0.84 %   BMI (Calculated) 32.9   Triceps Skinfold 40 mm   % Body Fat 45.5 %   Grip Strength 26 kg   Flexibility 13.5 in   Single Leg Stand 30 seconds       Nutrition Therapy Plan and Nutrition Goals:     Nutrition Therapy & Goals - 10/14/15 0932      Nutrition Therapy   Diet Therapeutic Lifestyle Changes     Personal Nutrition Goals   Personal Goal #1 1-2 lb wt loss/week to a goal wt loss of 10-15 lb at graduation from [place], educate and counsel regarding individualized specific dietary modifications aiming towards targeted core components such as weight, hypertension, lipid management, diabetes, heart failure and other comorbidities.   Expected Outcomes Short Term Goal: Understand basic principles of dietary content, such as calories, fat, sodium, cholesterol and nutrients.;Long Term Goal: Adherence to prescribed nutrition plan.      Nutrition Discharge: Nutrition Scores:     Nutrition Assessments - 10/29/15 1401      MEDFICTS Scores   Pre Score 18      Nutrition Goals Re-Evaluation:   Psychosocial: Target Goals:  Acknowledge presence or absence of depression, maximize coping skills, provide positive support system. Participant is able to verbalize types and ability to use techniques and skills needed for reducing stress and depression.  Initial Review &  Psychosocial Screening:     Initial Psych Review & Screening - 10/12/15 1523      Initial Review   Current issues with Current Stress Concerns     Family Dynamics   Good Support System? Yes     Barriers   Psychosocial barriers to participate in program The patient should benefit from training in stress management and relaxation.     Screening Interventions   Interventions Encouraged to exercise      Quality of Life Scores:     Quality of Life - 10/15/15 1710      Quality of Life Scores   Health/Function Pre 29.6 %   Socioeconomic Pre 28.21 %   Psych/Spiritual Pre 28.79 %   Family Pre 30 %   GLOBAL Pre 29.21 %      PHQ-9: Recent Review Flowsheet Data    Depression screen [facility] 2/9 10/18/2015   Decreased Interest 0   Down, Depressed, Hopeless 0   PHQ - 2 Score 0      Psychosocial Evaluation and Intervention:     Psychosocial Evaluation - 12/09/15 1528      Psychosocial Evaluation & Interventions   Interventions Encouraged to exercise with the program and follow exercise prescription   Comments no pyschosocial needs identified no interventions needed.      Psychosocial Re-Evaluation:     Psychosocial Re-Evaluation    [place] Name 12/09/15 1528             Psychosocial Re-Evaluation   Interventions Encouraged to attend Cardiac Rehabilitation for the exercise          Vocational Rehabilitation: Provide vocational rehab assistance to qualifying candidates.   Vocational Rehab Evaluation & Intervention:     Vocational Rehab - 10/12/15 1517      Initial Vocational Rehab Evaluation & Intervention   Assessment shows need for Vocational Rehabilitation No      Education: Education Goals: Education classes will be  provided on a weekly basis, covering required topics. Participant will state understanding/return demonstration of topics presented.  Learning Barriers/Preferences:     Learning Barriers/Preferences - 10/15/15 1711      Learning Barriers/Preferences   Learning Barriers None   Learning Preferences Written Material;Video;Pictoral      Education Topics: Count Your Pulse:  -Group instruction provided by verbal instruction, demonstration, patient participation and written materials to support subject.  Instructors address importance of being able to find your pulse and how to count your pulse when at home without a heart monitor.  Patients get hands on experience counting their pulse with staff help and individually.   Heart Attack, Angina, and Risk Factor Modification:  -Group instruction provided by verbal instruction, video, and written materials to support subject.  Instructors address signs and symptoms of angina and heart attacks.    Also discuss risk factors for heart disease and how to make changes to improve heart health risk factors.   Functional Fitness:  -Group instruction provided by verbal instruction, demonstration, patient participation, and written materials to support subject.  Instructors address safety measures for doing things around the house.  Discuss how to get up and down off the floor, how to pick things up properly, how to safely get out of a chair without assistance, and balance training. Flowsheet Row CARDIAC REHAB PHASE II EXERCISE from 12/01/2015 in [place]  Date  11/19/15  Educator  Seward Carol  Instruction Review Code  2- meets goals/outcomes      Meditation and Mindfulness:  -  Group instruction provided by verbal instruction, patient participation, and written materials to support subject.  Instructor addresses importance of mindfulness and meditation practice to help reduce stress and improve awareness.  Instructor  also leads participants through a meditation exercise.  Flowsheet Row CARDIAC REHAB PHASE II EXERCISE from 12/01/2015 in [place]  Date  11/24/15  Educator  Jeanella Craze  Instruction Review Code  2- meets goals/outcomes      Stretching for Flexibility and Mobility:  -Group instruction provided by verbal instruction, patient participation, and written materials to support subject.  Instructors lead participants through series of stretches that are designed to increase flexibility thus improving mobility.  These stretches are additional exercise for major muscle groups that are typically performed during regular warm up and cool down. Flowsheet Row CARDIAC REHAB PHASE II EXERCISE from 12/01/2015 in [place]  Date  11/26/15  Educator  Seward Carol  Instruction Review Code  R- Review/reinforce      Hands Only CPR Anytime:  -Group instruction provided by verbal instruction, video, patient participation and written materials to support subject.  Instructors co-teach with AHA video for hands only CPR.  Participants get hands on experience with mannequins.   Nutrition I class: Heart Healthy Eating:  -Group instruction provided by PowerPoint slides, verbal discussion, and written materials to support subject matter. The instructor gives an explanation and review of the Therapeutic Lifestyle Changes diet recommendations, which includes a discussion on lipid goals, dietary fat, sodium, fiber, plant stanol/sterol esters, sugar, and the components of a well-balanced, healthy diet.   Nutrition II class: Lifestyle Skills:  -Group instruction provided by PowerPoint slides, verbal discussion, and written materials to support subject matter. The instructor gives an explanation and review of label reading, grocery shopping for heart health, heart healthy recipe modifications, and ways to make healthier choices when eating out.   Diabetes  Question & Answer:  -Group instruction provided by PowerPoint slides, verbal discussion, and written materials to support subject matter. The instructor gives an explanation and review of diabetes co-morbidities, pre- and post-prandial blood glucose goals, pre-exercise blood glucose goals, signs, symptoms, and treatment of hypoglycemia and hyperglycemia, and foot care basics. Flowsheet Row CARDIAC REHAB PHASE II EXERCISE from 12/01/2015 in [place]  Date  11/12/15  Educator  RD  Instruction Review Code  2- meets goals/outcomes      Diabetes Blitz:  -Group instruction provided by PowerPoint slides, verbal discussion, and written materials to support subject matter. The instructor gives an explanation and review of the physiology behind type 1 and type 2 diabetes, diabetes medications and rational behind using different medications, pre- and post-prandial blood glucose recommendations and Hemoglobin A1c goals, diabetes diet, and exercise including blood glucose guidelines for exercising safely.    Portion Distortion:  -Group instruction provided by PowerPoint slides, verbal discussion, written materials, and food models to support subject matter. The instructor gives an explanation of serving size versus portion size, changes in portions sizes over the last 20 years, and what consists of a serving from each food group. Flowsheet Row CARDIAC REHAB PHASE II EXERCISE from 12/01/2015 in [place]  Date  11/03/15  Educator  RD  Instruction Review Code  2- meets goals/outcomes      Stress Management:  -Group instruction provided by verbal instruction, video, and written materials to support subject matter.  Instructors review role of stress in heart disease and how to cope  with stress positively.   Flowsheet Row CARDIAC REHAB PHASE II EXERCISE from 12/01/2015 in [place]  Date  10/27/15  Instruction  Review Code  2- meets goals/outcomes      Exercising on Your Own:  -Group instruction provided by verbal instruction, power point, and written materials to support subject.  Instructors discuss benefits of exercise, components of exercise, frequency and intensity of exercise, and end points for exercise.  Also discuss use of nitroglycerin and activating EMS.  Review options of places to exercise outside of rehab.  Review guidelines for sex with heart disease. Flowsheet Row CARDIAC REHAB PHASE II EXERCISE from 12/01/2015 in [place]  Date  10/20/15  Educator  Seward Carol  Instruction Review Code  2- meets goals/outcomes      Cardiac Drugs I:  -Group instruction provided by verbal instruction and written materials to support subject.  Instructor reviews cardiac drug classes: antiplatelets, anticoagulants, beta blockers, and statins.  Instructor discusses reasons, side effects, and lifestyle considerations for each drug class. Flowsheet Row CARDIAC REHAB PHASE II EXERCISE from 12/01/2015 in [place]  Date  11/10/15  Educator  pharmacist  Instruction Review Code  2- meets goals/outcomes      Cardiac Drugs II:  -Group instruction provided by verbal instruction and written materials to support subject.  Instructor reviews cardiac drug classes: angiotensin converting enzyme inhibitors (ACE-I), angiotensin II receptor blockers (ARBs), nitrates, and calcium channel blockers.  Instructor discusses reasons, side effects, and lifestyle considerations for each drug class.   Anatomy and Physiology of the Circulatory System:  -Group instruction provided by verbal instruction, video, and written materials to support subject.  Reviews functional anatomy of heart, how it relates to various diagnoses, and what role the heart plays in the overall system. Flowsheet Row CARDIAC REHAB PHASE II EXERCISE from 12/01/2015 in [place]  Date  12/01/15  Educator  Barnet Pall, RN  Instruction Review Code  2- meets goals/outcomes      Knowledge Questionnaire Score:     Knowledge Questionnaire Score - 10/15/15 1709      Knowledge Questionnaire Score   Pre Score 22/24      Core Components/Risk Factors/Patient Goals at Admission:     Personal Goals and Risk Factors at Admission - 10/12/15 1522      Core Components/Risk Factors/Patient Goals on Admission    Weight Management Weight Loss;Yes   Intervention Weight Management: Develop a combined nutrition and exercise program designed to reach desired caloric intake, while maintaining appropriate intake of nutrient and fiber, sodium and fats, and appropriate energy expenditure required for the weight goal.;Weight Management: Provide education and appropriate resources to help participant work on and attain dietary goals.;Weight Management/Obesity: Establish reasonable short term and long term weight goals.   Expected Outcomes Short Term: Continue to assess and modify interventions until short term weight is achieved;Long Term: Adherence to nutrition and physical activity/exercise program aimed toward attainment of established weight goal   Hypertension Yes   Intervention Provide education on lifestyle modifcations including regular physical activity/exercise, weight management, moderate sodium restriction and increased consumption of fresh fruit, vegetables, and low fat dairy, alcohol moderation, and smoking cessation.;Monitor prescription use compliance.   Expected Outcomes Short Term: Continued assessment and intervention until BP is < 140/54m HG in hypertensive participants. < 130/854mHG in hypertensive participants with diabetes, heart failure or chronic kidney disease.;Long Term: Maintenance of blood pressure at goal  levels.   Lipids Yes   Intervention Provide education and support for participant on nutrition & aerobic/resistive exercise along  with prescribed medications to achieve LDL '70mg'$ , HDL >'40mg'$ .   Expected Outcomes Short Term: Participant states understanding of desired cholesterol values and is compliant with medications prescribed. Participant is following exercise prescription and nutrition guidelines.;Long Term: Cholesterol controlled with medications as prescribed, with individualized exercise RX and with personalized nutrition plan. Value goals: LDL < '70mg'$ , HDL > 40 mg.   Stress Yes   Intervention Offer individual and/or small group education and counseling on adjustment to heart disease, stress management and health-related lifestyle change. Teach and support self-help strategies.;Refer participants experiencing significant psychosocial distress to appropriate mental health specialists for further evaluation and treatment. When possible, include family members and significant others in education/counseling sessions.   Expected Outcomes Short Term: Participant demonstrates changes in health-related behavior, relaxation and other stress management skills, ability to obtain effective social support, and compliance with psychotropic medications if prescribed.;Long Term: Emotional wellbeing is indicated by absence of clinically significant psychosocial distress or social isolation.      Core Components/Risk Factors/Patient Goals Review:      Goals and Risk Factor Review    Row Name 11/09/15 [phone]             Core Components/Risk Factors/Patient Goals Review   Personal Goals Review Weight Management/Obesity;Other       Review Participant has established a regular exercise routine. She is walking 2-3 days per week at home in addition to attending cardiac rehab. Weight is down 2.2lbs since orientation.       Expected Outcomes Return to exercise in [facility] [employer] [facility].          Core Components/Risk Factors/Patient Goals at Discharge (Final Review):      Goals  and Risk Factor Review - 11/09/15 0952      Core Components/Risk Factors/Patient Goals Review   Personal Goals Review Weight Management/Obesity;Other   Review Participant has established a regular exercise routine. She is walking 2-3 days per week at home in addition to attending cardiac rehab. Weight is down 2.2lbs since orientation.   Expected Outcomes Return to exercise in the [employer] [facility] and [facility].      ITP Comments:     ITP Comments    Row Name 10/12/15 1345 11/05/15 1135         ITP Comments Medical Director- Dr. Fransico Him, MD attended Hypertension education video/lecture class, met outcomes/goals 10/6         Comments:  Pt is making expected progress toward personal goals after completing 21 sessions. Pt is out of the country in [place] with her husband.  This was a preplanned trip prior to her cardiac event.  Pt is seen interacting positively with other participants.  Pt demonstrates positive and healthy outlook on life.  Recommend continued exercise and life style modification education including  stress management and relaxation techniques to decrease cardiac risk profile. Cherre Huger, BSN

## 2015-12-10 ENCOUNTER — Encounter (HOSPITAL_COMMUNITY): Payer: Medicare Other

## 2015-12-13 ENCOUNTER — Encounter (HOSPITAL_COMMUNITY): Payer: Medicare Other

## 2015-12-15 ENCOUNTER — Encounter (HOSPITAL_COMMUNITY)
Admission: RE | Admit: 2015-12-15 | Discharge: 2015-12-15 | Disposition: A | Discharge status: Home / Self Care | Payer: Medicare Other | Source: Ambulatory Visit | Attending: Cardiovascular Disease | Admitting: Cardiovascular Disease

## 2015-12-15 DIAGNOSIS — I213 ST elevation (STEMI) myocardial infarction of unspecified site: Secondary | ICD-10-CM

## 2015-12-15 DIAGNOSIS — Z955 Presence of coronary angioplasty implant and graft: Secondary | ICD-10-CM

## 2015-12-17 ENCOUNTER — Encounter (HOSPITAL_COMMUNITY)
Admission: RE | Admit: 2015-12-17 | Discharge: 2015-12-17 | Disposition: A | Discharge status: Home / Self Care | Payer: Medicare Other | Source: Ambulatory Visit | Attending: Cardiovascular Disease | Admitting: Cardiovascular Disease

## 2015-12-17 DIAGNOSIS — Z955 Presence of coronary angioplasty implant and graft: Secondary | ICD-10-CM

## 2015-12-17 DIAGNOSIS — I213 ST elevation (STEMI) myocardial infarction of unspecified site: Secondary | ICD-10-CM | POA: Diagnosis not present

## 2015-12-20 ENCOUNTER — Encounter (HOSPITAL_COMMUNITY)
Admission: RE | Admit: 2015-12-20 | Discharge: 2015-12-20 | Disposition: A | Discharge status: Home / Self Care | Payer: Medicare Other | Source: Ambulatory Visit | Attending: Cardiovascular Disease | Admitting: Cardiovascular Disease

## 2015-12-20 DIAGNOSIS — Z955 Presence of coronary angioplasty implant and graft: Secondary | ICD-10-CM

## 2015-12-20 DIAGNOSIS — I213 ST elevation (STEMI) myocardial infarction of unspecified site: Secondary | ICD-10-CM | POA: Diagnosis not present

## 2015-12-22 ENCOUNTER — Encounter (HOSPITAL_COMMUNITY)
Admission: RE | Admit: 2015-12-22 | Discharge: 2015-12-22 | Disposition: A | Discharge status: Home / Self Care | Payer: Medicare Other | Source: Ambulatory Visit | Attending: Cardiovascular Disease | Admitting: Cardiovascular Disease

## 2015-12-22 DIAGNOSIS — Z955 Presence of coronary angioplasty implant and graft: Secondary | ICD-10-CM | POA: Diagnosis not present

## 2015-12-22 DIAGNOSIS — I213 ST elevation (STEMI) myocardial infarction of unspecified site: Secondary | ICD-10-CM | POA: Diagnosis not present

## 2015-12-27 ENCOUNTER — Encounter (HOSPITAL_COMMUNITY)
Admission: RE | Admit: 2015-12-27 | Discharge: 2015-12-27 | Disposition: A | Discharge status: Home / Self Care | Payer: Medicare Other | Source: Ambulatory Visit | Attending: Cardiovascular Disease | Admitting: Cardiovascular Disease

## 2015-12-27 DIAGNOSIS — I213 ST elevation (STEMI) myocardial infarction of unspecified site: Secondary | ICD-10-CM

## 2015-12-27 DIAGNOSIS — Z955 Presence of coronary angioplasty implant and graft: Secondary | ICD-10-CM | POA: Diagnosis not present

## 2015-12-29 ENCOUNTER — Encounter (HOSPITAL_COMMUNITY)
Admission: RE | Admit: 2015-12-29 | Discharge: 2015-12-29 | Disposition: A | Discharge status: Home / Self Care | Payer: Medicare Other | Source: Ambulatory Visit | Attending: Cardiovascular Disease | Admitting: Cardiovascular Disease

## 2015-12-29 DIAGNOSIS — Z955 Presence of coronary angioplasty implant and graft: Secondary | ICD-10-CM | POA: Diagnosis not present

## 2015-12-29 DIAGNOSIS — I213 ST elevation (STEMI) myocardial infarction of unspecified site: Secondary | ICD-10-CM

## 2015-12-31 ENCOUNTER — Encounter (HOSPITAL_COMMUNITY)
Admission: RE | Admit: 2015-12-31 | Discharge: 2015-12-31 | Disposition: A | Discharge status: Home / Self Care | Payer: Medicare Other | Source: Ambulatory Visit | Attending: Cardiovascular Disease | Admitting: Cardiovascular Disease

## 2015-12-31 DIAGNOSIS — I213 ST elevation (STEMI) myocardial infarction of unspecified site: Secondary | ICD-10-CM

## 2015-12-31 DIAGNOSIS — Z955 Presence of coronary angioplasty implant and graft: Secondary | ICD-10-CM | POA: Diagnosis not present

## 2016-01-03 ENCOUNTER — Encounter (HOSPITAL_COMMUNITY)
Admission: RE | Admit: 2016-01-03 | Discharge: 2016-01-03 | Disposition: A | Discharge status: Home / Self Care | Payer: Medicare Other | Source: Ambulatory Visit | Attending: Cardiovascular Disease | Admitting: Cardiovascular Disease

## 2016-01-03 DIAGNOSIS — I213 ST elevation (STEMI) myocardial infarction of unspecified site: Secondary | ICD-10-CM | POA: Diagnosis not present

## 2016-01-03 DIAGNOSIS — Z955 Presence of coronary angioplasty implant and graft: Secondary | ICD-10-CM | POA: Diagnosis not present

## 2016-01-05 ENCOUNTER — Encounter (HOSPITAL_COMMUNITY)
Admission: RE | Admit: 2016-01-05 | Discharge: 2016-01-05 | Disposition: A | Discharge status: Home / Self Care | Payer: Medicare Other | Source: Ambulatory Visit | Attending: Cardiovascular Disease | Admitting: Cardiovascular Disease

## 2016-01-05 DIAGNOSIS — I213 ST elevation (STEMI) myocardial infarction of unspecified site: Secondary | ICD-10-CM | POA: Diagnosis not present

## 2016-01-05 DIAGNOSIS — Z955 Presence of coronary angioplasty implant and graft: Secondary | ICD-10-CM

## 2016-01-06 NOTE — Progress Notes (Signed)
Cardiac Individual Treatment Plan  Patient Details  Name: Gabrielle [name] MRN: [id number] Date of Birth: [birth year] Referring Provider:   Flowsheet Row CARDIAC REHAB PHASE II ORIENTATION from 10/12/2015 in [place]  Referring Provider  Sherren Mocha, MD      Initial Encounter Date:  [place] PHASE II ORIENTATION from 10/12/2015 in [place]  Date  10/12/15  Referring Provider  Sherren Mocha, MD      Visit Diagnosis: S/P drug eluting coronary stent placement  ST elevation myocardial infarction (STEMI), unspecified artery ([place])  Patient's Home Medications on Admission:  Current Outpatient Prescriptions:  .  acetaminophen (TYLENOL) 500 MG tablet, Take 1,500 mg by mouth daily. , Disp: , Rfl:  .  aspirin EC 81 MG tablet, Take 81 mg by mouth at bedtime. , Disp: , Rfl:  .  benazepril (LOTENSIN) 20 MG tablet, Take 20 mg by mouth 2 (two) times daily. , Disp: , Rfl:  .  BIOTIN PO, Take 1 tablet by mouth at bedtime. , Disp: , Rfl:  .  Calcium Carbonate-Vitamin D (CALCIUM 600 + D PO), Take 1 tablet by mouth 2 (two) times daily.  , Disp: , Rfl:  .  cholecalciferol (VITAMIN D) 1000 units tablet, Take 1,000 Units by mouth daily., Disp: , Rfl:  .  docusate sodium (COLACE) 100 MG capsule, Take 100 mg by mouth daily. , Disp: , Rfl:  .  fish oil-omega-3 fatty acids 1000 MG capsule, Take 1 g by mouth at bedtime. , Disp: , Rfl:  .  gabapentin (NEURONTIN) 300 MG capsule, Take 300 mg by mouth 2 (two) times daily., Disp: , Rfl: 1 .  glucosamine-chondroitin 500-400 MG tablet, Take 2 tablets by mouth daily with breakfast., Disp: , Rfl:  .  levothyroxine (SYNTHROID, LEVOTHROID) 75 MCG tablet, Take 75 mcg by mouth daily before breakfast. , Disp: , Rfl:  .  loratadine-pseudoephedrine (CLARITIN-D 24-HOUR) 10-240 MG per 24 hr tablet, Take 1 tablet by mouth at bedtime. , Disp: , Rfl:  .  Melatonin 3 MG TABS, Take 6 mg by  mouth at bedtime. , Disp: , Rfl:  .  metoprolol tartrate (LOPRESSOR) 25 MG tablet, Take 0.5 tablets (12.5 mg total) by mouth 2 (two) times daily., Disp: 90 tablet, Rfl: 1 .  nitroGLYCERIN (NITROSTAT) 0.4 MG SL tablet, Place 1 tablet (0.4 mg total) under the tongue every 5 (five) minutes x 3 doses as needed for chest pain., Disp: 25 tablet, Rfl: 2 .  omeprazole (PRILOSEC) 20 MG capsule, Take 20 mg by mouth daily with breakfast. , Disp: , Rfl:  .  polyethylene glycol (MIRALAX / GLYCOLAX) packet, Take 17 g by mouth daily as needed (for constipation). Mix with 8 oz liquid and drink, Disp: , Rfl:  .  potassium chloride (K-DUR,KLOR-CON) 10 MEQ tablet, Take 10 mEq by mouth daily. , Disp: , Rfl:  .  rosuvastatin (CRESTOR) 20 MG tablet, Take 1 tablet (20 mg total) by mouth at bedtime., Disp: 30 tablet, Rfl: 12 .  ticagrelor (BRILINTA) 90 MG TABS tablet, Take 1 tablet (90 mg total) by mouth 2 (two) times daily., Disp: 60 tablet, Rfl: 12 .  triamterene-hydrochlorothiazide (MAXZIDE-25) 37.5-25 MG tablet, Take 1 tablet by mouth daily., Disp: , Rfl: 1  Past Medical History: Past Medical History:  Diagnosis Date  . Arthritis 01-17-11   degenerative disc disease-all joints  . Cholesterol serum elevated 01-17-11   tx. meds  . GERD (gastroesophageal reflux disease) 01-17-11  tx. omeprazole  . Heart murmur    slight murmur  . Hypertension 01-17-11   tx. meds  . Seasonal allergies   . Sinus problem 01-17-11   tx. Claritin D  . ST elevation (STEMI) myocardial infarction involving left circumflex coronary artery ([place]) 08/21/2015    Tobacco Use: History  Smoking Status  . Former Smoker  . Packs/day: 1.00  . Years: 19.00  . Types: Cigarettes  . Quit date: 08/14/1983  Smokeless Tobacco  . Never Used    Labs: Recent Review Flowsheet Data    Labs for ITP Cardiac and Pulmonary Rehab Latest Ref Rng & Units 08/21/2015 08/22/2015 10/12/2015   Cholestrol 125 - 200 mg/dL - 176 180   LDLCALC <130 mg/dL - 82  78   HDL >=46 mg/dL - 63 74   Trlycerides <150 mg/dL - 153(H) 138   TCO2 0 - 100 mmol/L 22 - -      Capillary Blood Glucose: Lab Results  Component Value Date   GLUCAP 100 (H) 08/23/2015     Exercise Target Goals:    Exercise Program Goal: Individual exercise prescription set with THRR, safety & activity barriers. Participant demonstrates ability to understand and report RPE using BORG scale, to self-measure pulse accurately, and to acknowledge the importance of the exercise prescription.  Exercise Prescription Goal: Starting with aerobic activity 30 plus minutes a day, 3 days per week for initial exercise prescription. Provide home exercise prescription and guidelines that participant acknowledges understanding prior to discharge.  Activity Barriers & Risk Stratification:     Activity Barriers & Cardiac Risk Stratification - 10/12/15 1447      Activity Barriers & Cardiac Risk Stratification   Activity Barriers Arthritis;Back Problems;Left Knee Replacement;Right Knee Replacement;Joint Problems;History of Falls;Assistive Device   Cardiac Risk Stratification High      6 Minute Walk:     6 Minute Walk    Row Name 10/12/15 1653         6 Minute Walk   Phase Initial     Distance 1419 feet     Walk Time 6 minutes     # of Rest Breaks 0     MPH 2.69     METS 2.75     RPE 11     VO2 Peak 9.61     Symptoms Yes (comment)     Comments Pt c/o fatigue at end of walk, resolved with rest.     Resting HR 81 bpm     Resting BP 111/45     Max Ex. HR 102 bpm     Max Ex. BP 124/60     2 Minute Post BP 104/60        Initial Exercise Prescription:     Initial Exercise Prescription - 10/15/15 1700      Date of Initial Exercise RX and Referring Provider   Date 10/12/15   Referring Provider Sherren Mocha, MD     Bike   Level 0.7   Minutes 10   METs 2.71     NuStep   Level 2   Minutes 10   METs 2.5     Track   Laps 8   Minutes 10   METs 2.39      Prescription Details   Frequency (times per week) 3   Duration Progress to 30 minutes of continuous aerobic without signs/symptoms of physical distress     Intensity   THRR 40-80% of Max Heartrate 60-121   Ratings of Perceived Exertion 11-13  Perceived Dyspnea 0-4     Progression   Progression Continue to progress workloads to maintain intensity without signs/symptoms of physical distress.     Resistance Training   Training Prescription Yes   Weight 1lbs   Reps 10-12      Perform Capillary Blood Glucose checks as needed.  Exercise Prescription Changes:     Exercise Prescription Changes    Row Name 11/09/15 0900 11/24/15 1600 12/07/15 1000 01/04/16 1100       Exercise Review   Progression Yes Yes Yes Yes      Response to Exercise   Blood Pressure (Admit) 110/60 102/62 118/62 110/56    Blood Pressure (Exercise) 128/80 142/60 138/68 134/72    Blood Pressure (Exit) 100/68 102/70 110/68 110/60    Heart Rate (Admit) 72 bpm 79 bpm 78 bpm 83 bpm    Heart Rate (Exercise) 113 bpm 123 bpm 106 bpm 113 bpm    Heart Rate (Exit) 81 bpm 77 bpm 78 bpm 83 bpm    Rating of Perceived Exertion (Exercise) _0 Symptoms none none none none    Comments Home exercise guidelines reviewed on  10/20/15. Home exercise guidelines reviewed on  10/20/15. Home exercise guidelines reviewed on  10/20/15. Home exercise guidelines reviewed on  10/20/15.    Duration Progress to 30 minutes of continuous aerobic without signs/symptoms of physical distress Progress to 30 minutes of continuous aerobic without signs/symptoms of physical distress Progress to 30 minutes of continuous aerobic without signs/symptoms of physical distress Progress to 30 minutes of continuous aerobic without signs/symptoms of physical distress    Intensity THRR unchanged THRR unchanged THRR unchanged THRR unchanged      Progression   Progression Continue to progress workloads to maintain intensity without signs/symptoms of  physical distress. Continue to progress workloads to maintain intensity without signs/symptoms of physical distress. Continue to progress workloads to maintain intensity without signs/symptoms of physical distress. Continue to progress workloads to maintain intensity without signs/symptoms of physical distress.    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 Add 3 additional days to program exercise sessions. Add 3 additional days to program exercise sessions. Add 3 additional days to program exercise sessions.  Exercise Comments:     Exercise Comments    Row Name 10/20/15 1226 11/09/15 0951 12/31/15 1630       Exercise Comments Participant is walkng 30 minutes, 2-3 days per week. Doing well with exercise. Reviewed METs and goals. Pt is tolerating exercise well; will continue to monitor exercise progression.        Discharge Exercise Prescription (Final Exercise Prescription Changes):     Exercise Prescription Changes - 01/04/16 1100      Exercise Review   Progression Yes     Response to Exercise   Blood Pressure (Admit) 110/56   Blood Pressure (Exercise) 134/72   Blood Pressure (Exit) 110/60   Heart Rate (Admit) 83 bpm   Heart  Rate (Exercise) 113 bpm   Heart Rate (Exit) 83 bpm   Rating of Perceived Exertion (Exercise) 12   Symptoms none   Comments Home exercise guidelines reviewed on  10/20/15.   Duration Progress to 30 minutes of continuous aerobic without signs/symptoms of physical distress   Intensity THRR unchanged     Progression   Progression Continue to progress workloads to maintain intensity without signs/symptoms of physical distress.   Average METs 3.9     Resistance Training   Training Prescription Yes   Weight 5lbs   Reps 10-12     Interval Training   Interval Training No     Recumbant Bike   Level 3.5   Minutes 10   METs 3.3     NuStep   Level 5   Minutes 10   METs 5.7     Track   Laps 11   Minutes 3.26   METs 2.92     Home Exercise Plan   Plans to continue exercise at [employer] (comment)   Frequency Add 3 additional days to program exercise sessions.      Nutrition:  Target Goals: Understanding of nutrition guidelines, daily intake of sodium '1500mg'$ , cholesterol '200mg'$ , calories 30% from fat and 7% or less from saturated fats, daily to have 5 or more servings of fruits and vegetables.  Biometrics:     Pre Biometrics - 10/12/15 1415      Pre Biometrics   Height 5' 0.5" (1.537 m)   Weight 171 lb 1.2 oz (77.6 kg)   Waist Circumference 38 inches   Hip Circumference 45 inches   Waist to Hip Ratio 0.84 %   BMI (Calculated) 32.9   Triceps Skinfold 40 mm   % Body Fat 45.5 %   Grip Strength 26 kg   Flexibility 13.5 in   Single Leg Stand 30 seconds       Nutrition Therapy Plan and Nutrition Goals:     Nutrition Therapy & Goals - 10/14/15 0932      Nutrition Therapy   Diet Therapeutic Lifestyle Changes     Personal Nutrition Goals   Personal Goal #1 1-2 lb wt loss/week to a goal wt loss of 10-15 lb at graduation from [place], educate and counsel regarding individualized specific dietary  modifications aiming towards targeted core components such as weight, hypertension, lipid management, diabetes, heart failure and other comorbidities.   Expected Outcomes Short Term Goal: Understand basic principles of dietary content, such as calories, fat, sodium, cholesterol and nutrients.;Long Term Goal: Adherence to prescribed nutrition plan.      Nutrition Discharge: Nutrition Scores:     Nutrition Assessments - 10/29/15 1401      MEDFICTS Scores  Pre Score 18      Nutrition Goals Re-Evaluation:   Psychosocial: Target Goals: Acknowledge presence or absence of depression, maximize coping skills, provide positive support system. Participant is able to verbalize types and ability to use techniques and skills needed for reducing stress and depression.  Initial Review & Psychosocial Screening:     Initial Psych Review & Screening - 10/12/15 1523      Initial Review   Current issues with Current Stress Concerns     Family Dynamics   Good Support System? Yes     Barriers   Psychosocial barriers to participate in program The patient should benefit from training in stress management and relaxation.     Screening Interventions   Interventions Encouraged to exercise      Quality of Life Scores:     Quality of Life - 10/15/15 1710      Quality of Life Scores   Health/Function Pre 29.6 %   Socioeconomic Pre 28.21 %   Psych/Spiritual Pre 28.79 %   Family Pre 30 %   GLOBAL Pre 29.21 %      PHQ-9: Recent Review Flowsheet Data    Depression screen [facility] 2/9 10/18/2015   Decreased Interest 0   Down, Depressed, Hopeless 0   PHQ - 2 Score 0      Psychosocial Evaluation and Intervention:     Psychosocial Evaluation - 01/06/16 1620      Psychosocial Evaluation & Interventions   Interventions Encouraged to exercise with the program and follow exercise prescription   Comments no pyschosocial needs identified no interventions needed.      Psychosocial  Re-Evaluation:     Psychosocial Re-Evaluation    [place] Name 12/09/15 1528 01/06/16 1620           Psychosocial Re-Evaluation   Interventions Encouraged to attend Cardiac Rehabilitation for the exercise Encouraged to attend Cardiac Rehabilitation for the exercise;Relaxation education;Stress management education      Continued Psychosocial Services Needed  - No         Vocational Rehabilitation: Provide vocational rehab assistance to qualifying candidates.   Vocational Rehab Evaluation & Intervention:     Vocational Rehab - 10/12/15 1517      Initial Vocational Rehab Evaluation & Intervention   Assessment shows need for Vocational Rehabilitation No      Education: Education Goals: Education classes will be provided on a weekly basis, covering required topics. Participant will state understanding/return demonstration of topics presented.  Learning Barriers/Preferences:     Learning Barriers/Preferences - 10/15/15 1711      Learning Barriers/Preferences   Learning Barriers None   Learning Preferences Written Material;Video;Pictoral      Education Topics: Count Your Pulse:  -Group instruction provided by verbal instruction, demonstration, patient participation and written materials to support subject.  Instructors address importance of being able to find your pulse and how to count your pulse when at home without a heart monitor.  Patients get hands on experience counting their pulse with staff help and individually. Flowsheet Row CARDIAC REHAB PHASE II EXERCISE from 01/05/2016 in [place]  Date  01/05/16  Educator  Primus Bravo Ricahrds  Instruction Review Code  2- meets goals/outcomes      Heart Attack, Angina, and Risk Factor Modification:  -Group instruction provided by verbal instruction, video, and written materials to support subject.  Instructors address signs and symptoms of angina and heart attacks.    Also discuss risk factors for  heart disease and how  to make changes to improve heart health risk factors. Flowsheet Row CARDIAC REHAB PHASE II EXERCISE from 01/05/2016 in [place]  Date  12/31/15 [hypertension]  Educator  Andi Hence, RN  Instruction Review Code  2- meets goals/outcomes      Functional Fitness:  -Group instruction provided by verbal instruction, demonstration, patient participation, and written materials to support subject.  Instructors address safety measures for doing things around the house.  Discuss how to get up and down off the floor, how to pick things up properly, how to safely get out of a chair without assistance, and balance training. Flowsheet Row CARDIAC REHAB PHASE II EXERCISE from 01/05/2016 in [place]  Date  11/19/15  Educator  Seward Carol  Instruction Review Code  2- meets goals/outcomes      Meditation and Mindfulness:  -Group instruction provided by verbal instruction, patient participation, and written materials to support subject.  Instructor addresses importance of mindfulness and meditation practice to help reduce stress and improve awareness.  Instructor also leads participants through a meditation exercise.  Flowsheet Row CARDIAC REHAB PHASE II EXERCISE from 01/05/2016 in [place]  Date  11/24/15  Educator  Jeanella Craze  Instruction Review Code  2- meets goals/outcomes      Stretching for Flexibility and Mobility:  -Group instruction provided by verbal instruction, patient participation, and written materials to support subject.  Instructors lead participants through series of stretches that are designed to increase flexibility thus improving mobility.  These stretches are additional exercise for major muscle groups that are typically performed during regular warm up and cool down. Flowsheet Row CARDIAC REHAB PHASE II EXERCISE from 01/05/2016 in [place]  Date  11/26/15  Educator  Seward Carol  Instruction Review Code  R- Review/reinforce      Hands Only CPR Anytime:  -Group instruction provided by verbal instruction, video, patient participation and written materials to support subject.  Instructors co-teach with AHA video for hands only CPR.  Participants get hands on experience with mannequins.   Nutrition I class: Heart Healthy Eating:  -Group instruction provided by PowerPoint slides, verbal discussion, and written materials to support subject matter. The instructor gives an explanation and review of the Therapeutic Lifestyle Changes diet recommendations, which includes a discussion on lipid goals, dietary fat, sodium, fiber, plant stanol/sterol esters, sugar, and the components of a well-balanced, healthy diet.   Nutrition II class: Lifestyle Skills:  -Group instruction provided by PowerPoint slides, verbal discussion, and written materials to support subject matter. The instructor gives an explanation and review of label reading, grocery shopping for heart health, heart healthy recipe modifications, and ways to make healthier choices when eating out.   Diabetes Question & Answer:  -Group instruction provided by PowerPoint slides, verbal discussion, and written materials to support subject matter. The instructor gives an explanation and review of diabetes co-morbidities, pre- and post-prandial blood glucose goals, pre-exercise blood glucose goals, signs, symptoms, and treatment of hypoglycemia and hyperglycemia, and foot care basics. Flowsheet Row CARDIAC REHAB PHASE II EXERCISE from 01/05/2016 in [place]  Date  11/12/15  Educator  RD  Instruction Review Code  2- meets goals/outcomes      Diabetes Blitz:  -Group instruction provided by PowerPoint slides, verbal discussion, and written materials to support subject matter. The instructor gives an explanation and review of the  physiology behind type 1 and type 2  diabetes, diabetes medications and rational behind using different medications, pre- and post-prandial blood glucose recommendations and Hemoglobin A1c goals, diabetes diet, and exercise including blood glucose guidelines for exercising safely.    Portion Distortion:  -Group instruction provided by PowerPoint slides, verbal discussion, written materials, and food models to support subject matter. The instructor gives an explanation of serving size versus portion size, changes in portions sizes over the last 20 years, and what consists of a serving from each food group. Flowsheet Row CARDIAC REHAB PHASE II EXERCISE from 01/05/2016 in [place]  Date  12/29/15 [Holiday Eating Survival Tips]  Educator  RD  Instruction Review Code  2- meets goals/outcomes      Stress Management:  -Group instruction provided by verbal instruction, video, and written materials to support subject matter.  Instructors review role of stress in heart disease and how to cope with stress positively.   Flowsheet Row CARDIAC REHAB PHASE II EXERCISE from 01/05/2016 in [place]  Date  10/27/15  Instruction Review Code  2- meets goals/outcomes      Exercising on Your Own:  -Group instruction provided by verbal instruction, power point, and written materials to support subject.  Instructors discuss benefits of exercise, components of exercise, frequency and intensity of exercise, and end points for exercise.  Also discuss use of nitroglycerin and activating EMS.  Review options of places to exercise outside of rehab.  Review guidelines for sex with heart disease. Flowsheet Row CARDIAC REHAB PHASE II EXERCISE from 01/05/2016 in [place]  Date  12/17/15  Educator  Seward Carol  Instruction Review Code  2- meets goals/outcomes      Cardiac Drugs I:  -Group instruction provided by verbal  instruction and written materials to support subject.  Instructor reviews cardiac drug classes: antiplatelets, anticoagulants, beta blockers, and statins.  Instructor discusses reasons, side effects, and lifestyle considerations for each drug class. Flowsheet Row CARDIAC REHAB PHASE II EXERCISE from 01/05/2016 in [place]  Date  11/10/15  Educator  pharmacist  Instruction Review Code  2- meets goals/outcomes      Cardiac Drugs II:  -Group instruction provided by verbal instruction and written materials to support subject.  Instructor reviews cardiac drug classes: angiotensin converting enzyme inhibitors (ACE-I), angiotensin II receptor blockers (ARBs), nitrates, and calcium channel blockers.  Instructor discusses reasons, side effects, and lifestyle considerations for each drug class.   Anatomy and Physiology of the Circulatory System:  -Group instruction provided by verbal instruction, video, and written materials to support subject.  Reviews functional anatomy of heart, how it relates to various diagnoses, and what role the heart plays in the overall system. Flowsheet Row CARDIAC REHAB PHASE II EXERCISE from 01/05/2016 in [place]  Date  12/01/15  Educator  Barnet Pall, RN  Instruction Review Code  2- meets goals/outcomes      Knowledge Questionnaire Score:     Knowledge Questionnaire Score - 10/15/15 1709      Knowledge Questionnaire Score   Pre Score 22/24      Core Components/Risk Factors/Patient Goals at Admission:     Personal Goals and Risk Factors at Admission - 10/12/15 1522      Core Components/Risk Factors/Patient Goals on Admission    Weight Management Weight Loss;Yes   Intervention Weight Management: Develop a combined nutrition and exercise program designed to reach desired caloric intake, while maintaining appropriate intake of  nutrient and fiber, sodium and fats, and appropriate energy  expenditure required for the weight goal.;Weight Management: Provide education and appropriate resources to help participant work on and attain dietary goals.;Weight Management/Obesity: Establish reasonable short term and long term weight goals.   Expected Outcomes Short Term: Continue to assess and modify interventions until short term weight is achieved;Long Term: Adherence to nutrition and physical activity/exercise program aimed toward attainment of established weight goal   Hypertension Yes   Intervention Provide education on lifestyle modifcations including regular physical activity/exercise, weight management, moderate sodium restriction and increased consumption of fresh fruit, vegetables, and low fat dairy, alcohol moderation, and smoking cessation.;Monitor prescription use compliance.   Expected Outcomes Short Term: Continued assessment and intervention until BP is < 140/44m HG in hypertensive participants. < 130/844mHG in hypertensive participants with diabetes, heart failure or chronic kidney disease.;Long Term: Maintenance of blood pressure at goal levels.   Lipids Yes   Intervention Provide education and support for participant on nutrition & aerobic/resistive exercise along with prescribed medications to achieve LDL '70mg'$ , HDL >'40mg'$ .   Expected Outcomes Short Term: Participant states understanding of desired cholesterol values and is compliant with medications prescribed. Participant is following exercise prescription and nutrition guidelines.;Long Term: Cholesterol controlled with medications as prescribed, with individualized exercise RX and with personalized nutrition plan. Value goals: LDL < '70mg'$ , HDL > 40 mg.   Stress Yes   Intervention Offer individual and/or small group education and counseling on adjustment to heart disease, stress management and health-related lifestyle change. Teach and support self-help strategies.;Refer participants experiencing significant psychosocial distress  to appropriate mental health specialists for further evaluation and treatment. When possible, include family members and significant others in education/counseling sessions.   Expected Outcomes Short Term: Participant demonstrates changes in health-related behavior, relaxation and other stress management skills, ability to obtain effective social support, and compliance with psychotropic medications if prescribed.;Long Term: Emotional wellbeing is indicated by absence of clinically significant psychosocial distress or social isolation.      Core Components/Risk Factors/Patient Goals Review:      Goals and Risk Factor Review    Row Name 11/09/15 0914782/01/17 1625           Core Components/Risk Factors/Patient Goals Review   Personal Goals Review Weight Management/Obesity;Other Weight Management/Obesity;Other;Increase Strength and Stamina      Review Participant has established a regular exercise routine. She is walking 2-3 days per week at home in addition to attending cardiac rehab. Weight is down 2.2lbs since orientation. Pt is walking at home 2-3x/week at home. Pt is struggling with weighloss. Encouraged changing exercise order, changing WL or meeting with cardiac rehab nutrition for further assistance      Expected Outcomes [facility] [employer] [facility]. Pt will continue to show improvement in cardiovascular fitness. Pt will also benefit from nutrition counseling for weightloss advice.         Core Components/Risk Factors/Patient Goals at Discharge (Final Review):      Goals and Risk Factor Review - 12/31/15 1625      Core Components/Risk Factors/Patient Goals Review   Personal Goals Review Weight Management/Obesity;Other;Increase Strength and Stamina   Review Pt is walking at home 2-3x/week at home. Pt is struggling with weighloss. Encouraged changing exercise order, changing WL or meeting with cardiac rehab  nutrition for further assistance   Expected Outcomes Pt will continue to show improvement in cardiovascular fitness. Pt will also benefit from  nutrition counseling for weightloss advice.      ITP Comments:     ITP Comments    Row Name 10/12/15 1345 11/05/15 1135         ITP Comments Medical Director- Dr. Fransico Him, MD attended Hypertension education video/lecture class, met outcomes/goals 10/6         Comments:  Pt is making expected progress toward personal goals after completing 31sessions.  Pt works very hard in rehab given a good effort to exercise here in rehab but also to home exercise. Repeat Psychosocial Assessment: Pt with supportive family, denies any Psychosocial needs or interventions at this time. Recommend continued exercise and life style modification education including  stress management and relaxation techniques to decrease cardiac risk profile. Cherre Huger, BSN

## 2016-01-07 ENCOUNTER — Encounter (HOSPITAL_COMMUNITY)
Admission: RE | Admit: 2016-01-07 | Discharge: 2016-01-07 | Disposition: A | Discharge status: Home / Self Care | Payer: Medicare Other | Source: Ambulatory Visit | Attending: Cardiovascular Disease | Admitting: Cardiovascular Disease

## 2016-01-07 DIAGNOSIS — Z955 Presence of coronary angioplasty implant and graft: Secondary | ICD-10-CM

## 2016-01-07 DIAGNOSIS — I213 ST elevation (STEMI) myocardial infarction of unspecified site: Secondary | ICD-10-CM | POA: Diagnosis not present

## 2016-01-07 DIAGNOSIS — R350 Frequency of micturition: Secondary | ICD-10-CM | POA: Diagnosis not present

## 2016-01-10 ENCOUNTER — Encounter (HOSPITAL_COMMUNITY)
Admission: RE | Admit: 2016-01-10 | Discharge: 2016-01-10 | Disposition: A | Discharge status: Home / Self Care | Payer: Medicare Other | Source: Ambulatory Visit | Attending: Cardiovascular Disease | Admitting: Cardiovascular Disease

## 2016-01-10 DIAGNOSIS — Z955 Presence of coronary angioplasty implant and graft: Secondary | ICD-10-CM | POA: Diagnosis not present

## 2016-01-10 DIAGNOSIS — I213 ST elevation (STEMI) myocardial infarction of unspecified site: Secondary | ICD-10-CM | POA: Diagnosis not present

## 2016-01-10 DIAGNOSIS — R3129 Other microscopic hematuria: Secondary | ICD-10-CM | POA: Diagnosis not present

## 2016-01-12 ENCOUNTER — Encounter (HOSPITAL_COMMUNITY): Payer: Medicare Other

## 2016-01-14 ENCOUNTER — Encounter (HOSPITAL_COMMUNITY)
Admission: RE | Admit: 2016-01-14 | Discharge: 2016-01-14 | Disposition: A | Discharge status: Home / Self Care | Payer: Medicare Other | Source: Ambulatory Visit | Attending: Cardiovascular Disease | Admitting: Cardiovascular Disease

## 2016-01-14 DIAGNOSIS — Z955 Presence of coronary angioplasty implant and graft: Secondary | ICD-10-CM | POA: Diagnosis not present

## 2016-01-14 DIAGNOSIS — I213 ST elevation (STEMI) myocardial infarction of unspecified site: Secondary | ICD-10-CM | POA: Diagnosis not present

## 2016-01-17 ENCOUNTER — Encounter (HOSPITAL_COMMUNITY)
Admission: RE | Admit: 2016-01-17 | Discharge: 2016-01-17 | Disposition: A | Discharge status: Home / Self Care | Payer: Medicare Other | Source: Ambulatory Visit | Attending: Cardiovascular Disease | Admitting: Cardiovascular Disease

## 2016-01-17 DIAGNOSIS — I213 ST elevation (STEMI) myocardial infarction of unspecified site: Secondary | ICD-10-CM | POA: Diagnosis not present

## 2016-01-17 DIAGNOSIS — Z955 Presence of coronary angioplasty implant and graft: Secondary | ICD-10-CM

## 2016-01-19 ENCOUNTER — Encounter (HOSPITAL_COMMUNITY)
Admission: RE | Admit: 2016-01-19 | Discharge: 2016-01-19 | Disposition: A | Discharge status: Home / Self Care | Payer: Medicare Other | Source: Ambulatory Visit | Attending: Cardiovascular Disease | Admitting: Cardiovascular Disease

## 2016-01-19 VITALS — Ht 60.5 in | Wt 165.8 lb

## 2016-01-19 DIAGNOSIS — Z955 Presence of coronary angioplasty implant and graft: Secondary | ICD-10-CM

## 2016-01-19 DIAGNOSIS — I213 ST elevation (STEMI) myocardial infarction of unspecified site: Secondary | ICD-10-CM | POA: Diagnosis not present

## 2016-01-19 NOTE — Progress Notes (Signed)
Discharge Summary  Patient Details  Name: Gabrielle [name] MRN: [id number] Date of Birth: Oct 27, 1945 Referring Provider:   Flowsheet Row CARDIAC REHAB PHASE II ORIENTATION from 10/12/2015 in [facility]  Referring Provider  Sherren Mocha, MD       Number of Visits: 36  Reason for Discharge:  Patient reached a stable level of exercise. Patient independent in their exercise.  Smoking History:  History  Smoking Status  . Former Smoker  . Packs/day: 1.00  . Years: 19.00  . Types: Cigarettes  . Quit date: 08/14/1983  Smokeless Tobacco  . Never Used    Diagnosis:  S/P drug eluting coronary stent placement  ST elevation myocardial infarction (STEMI), unspecified artery ([place])  ADL [facility]:   Initial Exercise Prescription:     Initial Exercise Prescription - 10/15/15 1700      Date of Initial Exercise RX and Referring Provider   Date 10/12/15   Referring Provider Sherren Mocha, MD     Bike   Level 0.7   Minutes 10   METs 2.71     NuStep   Level 2   Minutes 10   METs 2.5     Track   Laps 8   Minutes 10   METs 2.39     Prescription Details   Frequency (times per week) 3   Duration Progress to 30 minutes of continuous aerobic without signs/symptoms of physical distress     Intensity   THRR 40-80% of Max Heartrate 60-121   Ratings of Perceived Exertion 11-13   Perceived Dyspnea 0-4     Progression   Progression Continue to progress workloads to maintain intensity without signs/symptoms of physical distress.     Resistance Training   Training Prescription Yes   Weight 1lbs   Reps 10-12      Discharge Exercise Prescription (Final Exercise Prescription Changes):     Exercise Prescription Changes - 01/04/16 1100      Exercise Review   Progression Yes     Response to Exercise   Blood Pressure (Admit) 110/56   Blood Pressure (Exercise) 134/72   Blood Pressure (Exit) 110/60   Heart Rate (Admit) 83 bpm   Heart Rate  (Exercise) 113 bpm   Heart Rate (Exit) 83 bpm   Rating of Perceived Exertion (Exercise) 12   Symptoms none   Comments Home exercise guidelines reviewed on  10/20/15.   Duration Progress to 30 minutes of continuous aerobic without signs/symptoms of physical distress   Intensity THRR unchanged     Progression   Progression Continue to progress workloads to maintain intensity without signs/symptoms of physical distress.   Average METs 3.9     Resistance Training   Training Prescription Yes   Weight 5lbs   Reps 10-12     Interval Training   Interval Training No     Recumbant Bike   Level 3.5   Minutes 10   METs 3.3     NuStep   Level 5   Minutes 10   METs 5.7     Track   Laps 11   Minutes 3.26   METs 2.92     Home Exercise Plan   Plans to continue exercise at [employer] [employer] (comment)   Frequency Add 3 additional days to program exercise sessions.      Functional Capacity:     6 Minute Walk    Row Name 10/12/15 1653         6 Minute Walk  Phase Initial     Distance 1419 feet     Walk Time 6 minutes     # of Rest Breaks 0     MPH 2.69     METS 2.75     RPE 11     VO2 Peak 9.61     Symptoms Yes (comment)     Comments Pt c/o fatigue at end of walk, resolved with rest.     Resting HR 81 bpm     Resting BP 111/45     Max Ex. HR 102 bpm     Max Ex. BP 124/60     2 Minute Post BP 104/60        Psychological, QOL, Others - Outcomes: PHQ 2/9: Depression screen PHQ 2/9 01/19/2016 10/18/2015  Decreased Interest 0 0  Down, Depressed, Hopeless 0 0  PHQ - 2 Score 0 0    Quality of Life:     Quality of Life - 01/17/16 1530      Quality of Life Scores   Health/Function Pre 29.6 %   Health/Function Post 30 %   Health/Function % Change 1.35 %   Socioeconomic Pre 28.21 %   Socioeconomic Post 28.21 %   Socioeconomic % Change  0 %   Psych/Spiritual Pre 28.79 %   Psych/Spiritual Post 30 %   Psych/Spiritual % Change 4.2 %   Family Pre 30 %   Family  Post 30 %   Family % Change 0 %   GLOBAL Pre 29.21 %   GLOBAL Post 29.63 %   GLOBAL % Change 1.44 %      Personal Goals: Goals established at orientation with interventions provided to work toward goal.     Personal Goals and Risk Factors at Admission - 10/12/15 1522      Core Components/Risk Factors/Patient Goals on Admission    Weight Management Weight Loss;Yes   Intervention Weight Management: Develop a combined nutrition and exercise program designed to reach desired caloric intake, while maintaining appropriate intake of nutrient and fiber, sodium and fats, and appropriate energy expenditure required for the weight goal.;Weight Management: Provide education and appropriate resources to help participant work on and attain dietary goals.;Weight Management/Obesity: Establish reasonable short term and long term weight goals.   Expected Outcomes Short Term: Continue to assess and modify interventions until short term weight is achieved;Long Term: Adherence to nutrition and physical activity/exercise program aimed toward attainment of established weight goal   Hypertension Yes   Intervention Provide education on lifestyle modifcations including regular physical activity/exercise, weight management, moderate sodium restriction and increased consumption of fresh fruit, vegetables, and low fat dairy, alcohol moderation, and smoking cessation.;Monitor prescription use compliance.   Expected Outcomes Short Term: Continued assessment and intervention until BP is < 140/90mm HG in hypertensive participants. < 130/80mm HG in hypertensive participants with diabetes, heart failure or chronic kidney disease.;Long Term: Maintenance of blood pressure at goal levels.   Lipids Yes   Intervention Provide education and support for participant on nutrition & aerobic/resistive exercise along with prescribed medications to achieve LDL <70mg, HDL >40mg.   Expected Outcomes Short Term: Participant states  understanding of desired cholesterol values and is compliant with medications prescribed. Participant is following exercise prescription and nutrition guidelines.;Long Term: Cholesterol controlled with medications as prescribed, with individualized exercise RX and with personalized nutrition plan. Value goals: LDL < 70mg, HDL > 40 mg.   Stress Yes   Intervention Offer individual and/or small group education and counseling on adjustment to   heart disease, stress management and health-related lifestyle change. Teach and support self-help strategies.;Refer participants experiencing significant psychosocial distress to appropriate mental health specialists for further evaluation and treatment. When possible, include family members and significant others in education/counseling sessions.   Expected Outcomes Short Term: Participant demonstrates changes in health-related behavior, relaxation and other stress management skills, ability to obtain effective social support, and compliance with psychotropic medications if prescribed.;Long Term: Emotional wellbeing is indicated by absence of clinically significant psychosocial distress or social isolation.       Personal Goals Discharge:     Goals and Risk Factor Review    Row Name 11/09/15 0952 12/31/15 1625           Core Components/Risk Factors/Patient Goals Review   Personal Goals Review Weight Management/Obesity;Other Weight Management/Obesity;Other;Increase Strength and Stamina      Review Participant has established a regular exercise routine. She is walking 2-3 days per week at home in addition to attending cardiac rehab. Weight is down 2.2lbs since orientation. Pt is walking at home 2-3x/week at home. Pt is struggling with weighloss. Encouraged changing exercise order, changing WL or meeting with cardiac rehab nutrition for further assistance      Expected Outcomes [facility] [employer] [facility]. Pt will continue to show improvement in cardiovascular fitness. Pt will also benefit from nutrition counseling for weightloss advice.         Nutrition & Weight - Outcomes:     Pre Biometrics - 10/12/15 1415      Pre Biometrics   Height 5' 0.5" (1.537 m)   Weight 171 lb 1.2 oz (77.6 kg)   Waist Circumference 38 inches   Hip Circumference 45 inches   Waist to Hip Ratio 0.84 %   BMI (Calculated) 32.9   Triceps Skinfold 40 mm   % Body Fat 45.5 %   Grip Strength 26 kg   Flexibility 13.5 in   Single Leg Stand 30 seconds       Nutrition:     Nutrition Therapy & Goals - 10/14/15 0932      Nutrition Therapy   Diet Therapeutic Lifestyle Changes     Personal Nutrition Goals   Personal Goal #1 1-2 lb wt loss/week to a goal wt loss of 10-15 lb at graduation from Cardiac Rehab     Intervention Plan   Intervention Prescribe, educate and counsel regarding individualized specific dietary modifications aiming towards targeted core components such as weight, hypertension, lipid management, diabetes, heart failure and other comorbidities.   Expected Outcomes Short Term Goal: Understand basic principles of dietary content, such as calories, fat, sodium, cholesterol and nutrients.;Long Term Goal: Adherence to prescribed nutrition plan.      Nutrition Discharge:     Nutrition Assessments - 10/29/15 1401      MEDFICTS Scores   Pre Score 18      Education Questionnaire Score:     Knowledge Questionnaire Score - 01/17/16 1530      Knowledge Questionnaire Score   Post Score 23/24      Goals reviewed with patient. Pt graduated from cardiac rehab program today with completion of 36 exercise sessions in Phase II. Pt maintained good attendance and progressed nicely during her participation in rehab as evidenced by increased MET level.   Medication list reconciled. Repeat  PHQ score-0.  Pt demonstrated positive and healthy coping skills.  Pt repeat QOL survey shows  improvement with maintaining for   socioeconomic which pt lifestyle remained unchanged.   Pt repeat scores are as follows     Quality of Life - 01/17/16 1530      Quality of Life Scores   Health/Function Pre 29.6 %   Health/Function Post 30 %   Health/Function % Change 1.35 %   Socioeconomic Pre 28.21 %   Socioeconomic Post 28.21 %   Socioeconomic % Change  0 %   Psych/Spiritual Pre 28.79 %   Psych/Spiritual Post 30 %   Psych/Spiritual % Change 4.2 %   Family Pre 30 %   Family Post 30 %   Family % Change 0 %   GLOBAL Pre 29.21 %   GLOBAL Post 29.63 %   GLOBAL % Change 1.44 %        Pt has made significant lifestyle changes and should be commended for her success. Pt feels she has made progress toward achieving her goals during cardiac rehab.Pt would like to return back to routine exercise. Pt has developed an exercise routine and will return to yoga exercise.   Pt desired to lose 10 pounds.  Pt lost 1.5 kg 3.3 pounds and has maintained this during her participation in rehab. Pt feels she has the tools to be successful in weight loss.  Pt plans to continue exercise at [facility] with yoga and equipment and walking with her husband on alternate days..  It was indeed a true delight to have this patient participate in cardiac rehab. Carlette Carlton RN, BSN   

## 2016-01-21 ENCOUNTER — Encounter (HOSPITAL_COMMUNITY): Payer: Medicare Other

## 2016-01-26 ENCOUNTER — Encounter (HOSPITAL_COMMUNITY): Payer: Medicare Other

## 2016-01-28 ENCOUNTER — Encounter (HOSPITAL_COMMUNITY): Payer: Medicare Other

## 2016-02-02 ENCOUNTER — Encounter (HOSPITAL_COMMUNITY): Payer: Medicare Other

## 2016-02-04 ENCOUNTER — Encounter (HOSPITAL_COMMUNITY): Payer: Medicare Other

## 2016-02-07 ENCOUNTER — Encounter (HOSPITAL_COMMUNITY): Payer: Self-pay | Admitting: *Deleted

## 2016-02-07 ENCOUNTER — Encounter (HOSPITAL_COMMUNITY): Payer: Medicare Other

## 2016-02-09 ENCOUNTER — Encounter (HOSPITAL_COMMUNITY): Payer: Medicare Other

## 2016-02-10 ENCOUNTER — Telehealth (HOSPITAL_COMMUNITY): Payer: Self-pay | Admitting: *Deleted

## 2016-02-11 ENCOUNTER — Encounter (HOSPITAL_COMMUNITY): Payer: Medicare Other

## 2016-02-14 ENCOUNTER — Encounter (HOSPITAL_COMMUNITY): Payer: Medicare Other

## 2016-02-14 ENCOUNTER — Ambulatory Visit: Payer: Medicare Other | Admitting: Physician Assistant

## 2016-02-15 ENCOUNTER — Ambulatory Visit (INDEPENDENT_AMBULATORY_CARE_PROVIDER_SITE_OTHER): Payer: Medicare Other | Admitting: Physician Assistant

## 2016-02-15 ENCOUNTER — Encounter: Payer: Self-pay | Admitting: Physician Assistant

## 2016-02-15 VITALS — BP 120/60 | HR 79 | Ht 60.0 in | Wt 168.0 lb

## 2016-02-15 DIAGNOSIS — E785 Hyperlipidemia, unspecified: Secondary | ICD-10-CM

## 2016-02-15 DIAGNOSIS — M79604 Pain in right leg: Secondary | ICD-10-CM | POA: Diagnosis not present

## 2016-02-15 DIAGNOSIS — I10 Essential (primary) hypertension: Secondary | ICD-10-CM | POA: Diagnosis not present

## 2016-02-15 DIAGNOSIS — I2510 Atherosclerotic heart disease of native coronary artery without angina pectoris: Secondary | ICD-10-CM

## 2016-02-15 DIAGNOSIS — M79605 Pain in left leg: Secondary | ICD-10-CM

## 2016-02-15 NOTE — Patient Instructions (Addendum)
Medication Instructions:  Your physician recommends that you continue on your current medications as directed. Please refer to the Current Medication list given to you today.  Labwork: NONE  Testing/Procedures: Your physician has requested that you have a lower extremity arterial duplex TO BE DONE WITH ABI'S . This test is an ultrasound of the arteries in the legs or arms. It looks at arterial blood flow in the legs and arms. Allow one hour for Lower and Upper Arterial scans. There are no restrictions or special instructions  Follow-Up: YOU WILL NEED A FOLLOW UP WITH DR. Burt Knack IN 07/2016; WE WILL SEND OUT A REMINDER LETTER IN FEW MONTHS TO CALL AND MAKE AN APPT  Any Other Special Instructions Will Be Listed Below (If Applicable).  If you need a refill on your cardiac medications before your next appointment, please call your pharmacy.

## 2016-02-15 NOTE — Progress Notes (Signed)
Cardiology Office Note:    Date:  02/15/2016   ID:  Gabrielle [name], DOB [birth year], MRN [id number]  PCP:  Gennette Pac, MD  Cardiologist:  Dr. Sherren Mocha   Electrophysiologist:  n/a  Referring MD: Hulan Fess, MD   Chief Complaint  Patient presents with  . Follow-up    CAD     History of Present Illness:    Gabrielle [name] is a 71 y.o. female with a hx of CAD. She presented with an inferolateral STEMI in 7/17. At cardiac catheterization, she had total occlusion of the OM1 which was treated with a DES. She also had severe stenosis of the RCA which was treated with staged intervention. Last seen by Dr. Burt Knack 9/17.  She returns for Cardiology follow up.  She is here with her husband.  She has completed cardiac rehabilitation and plans to go to [employer] at the [facility].  She denies chest pain, shortness of breath, syncope, orthopnea, PND, edema.  She dose note bilateral thigh pain with exertion.  She gets some discomfort at rest.  She denies ulcers or sores on her feet. She does have a hx of back surgery and also has neuropathy.  Prior CV studies that were reviewed today include:    PCI 08/23/15 2.5 x 16 mm Promus DES to the mid RCA  Echo 08/23/15  EF 55-60, normal wall motion, normal diastolic function  LHC [id number] LM 40 distal LAD proximal 25 LCx proximal 60-70, mid 50, OM1 100 with heavy thrombus RCA mid 85 LVEF preserved with lateral akinesis and inferior wall hypokinesis PCI: 2.5 x 16 mm Promus DES to the OM1  Past Medical History:  Diagnosis Date  . Arthritis 01-17-11   degenerative disc disease-all joints  . Cholesterol serum elevated 01-17-11   tx. meds  . GERD (gastroesophageal reflux disease) 01-17-11   tx. omeprazole  . Heart murmur    slight murmur  . Hypertension 01-17-11   tx. meds  . Seasonal allergies   . Sinus problem 01-17-11   tx. Claritin D  . ST elevation (STEMI) myocardial infarction involving left circumflex coronary artery  ([place]) 08/21/2015    Past Surgical History:  Procedure Laterality Date  . ABDOMINAL HYSTERECTOMY  01-17-11   '93-Hysterectomy-heavy bleeding  . BACK SURGERY  01-17-11   04-20-10-T-Lift lumbar fusion  . [facility] PROCEDURE  01-17-11   Bladder sling  . CARDIAC CATHETERIZATION N/A 08/21/2015   Procedure: Left Heart Cath and Coronary Angiography;  Surgeon: Sherren Mocha, MD;  Location: [place] [facility];  Service: Cardiovascular;  Laterality: N/A;  . CARDIAC CATHETERIZATION N/A 08/21/2015   Procedure: Coronary Stent Intervention;  Surgeon: Sherren Mocha, MD;  Location: [place] [facility];  Service: Cardiovascular;  Laterality: N/A;  . CARDIAC CATHETERIZATION N/A 08/23/2015   Procedure: Coronary Stent Intervention;  Surgeon: Leonie Man, MD;  Location: [place] [facility];  Service: Cardiovascular;  Laterality: N/A;  promus 2.5x16 in RCA  . CARDIAC CATHETERIZATION N/A 08/23/2015   Procedure: Left Heart Cath and Coronary Angiography;  Surgeon: Leonie Man, MD;  Location: [place] [facility];  Service: Cardiovascular;  Laterality: N/A;  . CARPAL TUNNEL RELEASE  01-17-11   '02-left, also right  . COLONOSCOPY    . HARDWARE REMOVAL Right 05/15/2013   Procedure: REMOVAL RIGHT L4-L5 PEDICLE SCREWS AND ROD ;  Surgeon: Melina Schools, MD;  Location: [place];  Service: Orthopedics;  Laterality: Right;  . JOINT REPLACEMENT  01-17-11    RTKA' 02  .  LAPAROSCOPIC APPENDECTOMY N/A 08/13/2013   Procedure: APPENDECTOMY LAPAROSCOPIC;  Surgeon: Pedro Earls, MD;  Location: [facility];  Service: General;  Laterality: N/A;  . TONSILLECTOMY  01-17-11   child  . TOTAL KNEE ARTHROPLASTY  01/19/2011   Procedure: TOTAL KNEE ARTHROPLASTY;  Surgeon: Johnn Hai;  Location: [facility];  Service: Orthopedics;  Laterality: Left;    Current Medications: Current Meds  Medication Sig  . acetaminophen (TYLENOL) 500 MG tablet Take 1,500 mg by mouth daily.   Marland Kitchen aspirin EC 81 MG tablet Take 81 mg by mouth at bedtime.   .  benazepril (LOTENSIN) 20 MG tablet Take 20 mg by mouth 2 (two) times daily.   Marland Kitchen BIOTIN PO Take 1 tablet by mouth at bedtime.   . Calcium Carbonate-Vitamin D (CALCIUM 600 + D PO) Take 1 tablet by mouth 2 (two) times daily.    . cholecalciferol (VITAMIN D) 1000 units tablet Take 1,000 Units by mouth daily.  Marland Kitchen docusate sodium (COLACE) 100 MG capsule Take 100 mg by mouth daily.   . fish oil-omega-3 fatty acids 1000 MG capsule Take 1 g by mouth at bedtime.   . gabapentin (NEURONTIN) 300 MG capsule Take 300 mg by mouth 2 (two) times daily.  Marland Kitchen glucosamine-chondroitin 500-400 MG tablet Take 2 tablets by mouth daily with breakfast.  . levothyroxine (SYNTHROID, LEVOTHROID) 75 MCG tablet Take 75 mcg by mouth daily before breakfast.   . loratadine-pseudoephedrine (CLARITIN-D 24-HOUR) 10-240 MG per 24 hr tablet Take 1 tablet by mouth at bedtime.   . Melatonin 3 MG TABS Take 6 mg by mouth at bedtime.   . metoprolol tartrate (LOPRESSOR) 25 MG tablet Take 0.5 tablets (12.5 mg total) by mouth 2 (two) times daily.  . nitroGLYCERIN (NITROSTAT) 0.4 MG SL tablet Place 1 tablet (0.4 mg total) under the tongue every 5 (five) minutes x 3 doses as needed for chest pain.  Marland Kitchen omeprazole (PRILOSEC) 20 MG capsule Take 20 mg by mouth daily with breakfast.   . polyethylene glycol (MIRALAX / GLYCOLAX) packet Take 17 g by mouth daily as needed (for constipation). Mix with 8 oz liquid and drink  . potassium chloride (K-DUR,KLOR-CON) 10 MEQ tablet Take 10 mEq by mouth daily.   . rosuvastatin (CRESTOR) 20 MG tablet Take 1 tablet (20 mg total) by mouth at bedtime.  . ticagrelor (BRILINTA) 90 MG TABS tablet Take 1 tablet (90 mg total) by mouth 2 (two) times daily.  Marland Kitchen triamterene-hydrochlorothiazide (MAXZIDE-25) 37.5-25 MG tablet Take 1 tablet by mouth daily.     Allergies:   Penicillins and Pravastatin   Social History   Social History  . Marital status: Married    Spouse name: N/A  . Number of children: N/A  . Years of  education: N/A   Social History Main Topics  . Smoking status: Former Smoker    Packs/day: 1.00    Years: 19.00    Types: Cigarettes    Quit date: 08/14/1983  . Smokeless tobacco: Never Used  . Alcohol use 1.2 oz/week    2 Glasses of wine per week     Comment: per day  . Drug use: No  . Sexual activity: Yes   Other Topics Concern  . None   Social History Narrative  . None     Family History:  The patient's family history includes Heart disease in her brother and father.   ROS:   Please see the history of present illness.    ROS All other systems reviewed  and are negative.   EKGs/Labs/Other Test Reviewed:    EKG:  EKG is  ordered today.  The ekg ordered today demonstrates NSR, HR 79, LAD, IVCD, QTc 435 ms, no changes since prior tracing.   Recent Labs: 08/21/2015: B Natriuretic Peptide 27.7; TSH 1.982 08/24/2015: BUN 12; Creatinine, Ser 0.82; Hemoglobin 11.6; Platelets 175; Potassium 3.9; Sodium 135 10/12/2015: ALT 24   Recent Lipid Panel    Component Value Date/Time   CHOL 180 10/12/2015 0835   TRIG 138 10/12/2015 0835   HDL 74 10/12/2015 0835   CHOLHDL 2.4 10/12/2015 0835   VLDL 28 10/12/2015 0835   LDLCALC 78 10/12/2015 0835     Physical Exam:    VS:  BP 120/60   Pulse 79   Ht 5' (1.524 m)   Wt 168 lb (76.2 kg)   BMI 32.81 kg/m     Wt Readings from Last 3 Encounters:  02/15/16 168 lb (76.2 kg)  01/28/16 165 lb 12.6 oz (75.2 kg)  10/14/15 169 lb 9.6 oz (76.9 kg)     Physical Exam  Constitutional: She is oriented to person, place, and time. She appears well-developed and well-nourished. No distress.  HENT:  Head: Normocephalic and atraumatic.  Eyes: No scleral icterus.  Neck: Normal range of motion. No JVD present. Carotid bruit is not present.  Cardiovascular: Normal rate, regular rhythm, S1 normal, S2 normal and normal heart sounds.   No murmur heard. Pulses:      Dorsalis pedis pulses are 1+ on the right side, and 1+ on the left side.        Posterior tibial pulses are 1+ on the right side, and 1+ on the left side.  Pulmonary/Chest: Breath sounds normal. She has no wheezes. She has no rhonchi. She has no rales.  Abdominal: Soft. There is no tenderness.  Musculoskeletal: She exhibits no edema.  Neurological: She is alert and oriented to person, place, and time.  Skin: Skin is warm and dry.  Psychiatric: She has a normal mood and affect.    ASSESSMENT:    1. Coronary artery disease involving native coronary artery of native heart without angina pectoris   2. Essential hypertension   3. Dyslipidemia   4. Pain in both lower extremities    PLAN:    In order of problems listed above:  1. CAD - s/p STEMI in 7/17 tx with DES to OM1 and staged PCI with DES to RCA.  EF was preserved by echo.  She is doing well without angina.  She has completed cardiac rehabilitation and plans to continue exercise in the community.    -  Continue ASA, Brilinta, statin, beta-blocker, ACE inhibitor.  2. HTN - BP controlled on current regimen.  3. HL - Continue Rosuvastatin 20.  LDL in 9/17 was 78.  4. Leg pain - Symptoms with typical and atypical features of claudication. Symptoms may be related to radiculopathy given known back issues.  DP/PT difficult to palpate.  With + CAD, she is at risk of PAD.     -  Check ABIs/arterial dopplers.   Medication Adjustments/Labs and Tests Ordered: Current medicines are reviewed at length with the patient today.  Concerns regarding medicines are outlined above.  Medication changes, Labs and Tests ordered today are outlined in the Patient Instructions noted below. Patient Instructions  Medication Instructions:  Your physician recommends that you continue on your current medications as directed. Please refer to the Current Medication list given to you today.  Labwork: NONE  Testing/Procedures: Your physician has requested that you have a lower extremity arterial duplex TO BE DONE WITH ABI'S . This test is  an ultrasound of the arteries in the legs or arms. It looks at arterial blood flow in the legs and arms. Allow one hour for Lower and Upper Arterial scans. There are no restrictions or special instructions  Follow-Up: YOU WILL NEED A FOLLOW UP WITH DR. Burt Knack IN 07/2016; WE WILL SEND OUT A REMINDER LETTER IN FEW MONTHS TO CALL AND MAKE AN APPT  Any Other Special Instructions Will Be Listed Below (If Applicable).  If you need a refill on your cardiac medications before your next appointment, please call your pharmacy.  Signed, Richardson Dopp, PA-C  02/15/2016 9:58 AM    [facility] [address], [place], [place]  [address] Phone: [phone]; Fax: [phone]

## 2016-02-18 ENCOUNTER — Other Ambulatory Visit: Payer: Self-pay | Admitting: Physician Assistant

## 2016-02-18 DIAGNOSIS — M79605 Pain in left leg: Principal | ICD-10-CM

## 2016-02-18 DIAGNOSIS — N189 Chronic kidney disease, unspecified: Secondary | ICD-10-CM | POA: Diagnosis not present

## 2016-02-18 DIAGNOSIS — I10 Essential (primary) hypertension: Secondary | ICD-10-CM | POA: Diagnosis not present

## 2016-02-18 DIAGNOSIS — E782 Mixed hyperlipidemia: Secondary | ICD-10-CM | POA: Diagnosis not present

## 2016-02-18 DIAGNOSIS — M899 Disorder of bone, unspecified: Secondary | ICD-10-CM | POA: Diagnosis not present

## 2016-02-18 DIAGNOSIS — R7301 Impaired fasting glucose: Secondary | ICD-10-CM | POA: Diagnosis not present

## 2016-02-18 DIAGNOSIS — M79606 Pain in leg, unspecified: Secondary | ICD-10-CM | POA: Diagnosis not present

## 2016-02-18 DIAGNOSIS — Z1389 Encounter for screening for other disorder: Secondary | ICD-10-CM | POA: Diagnosis not present

## 2016-02-18 DIAGNOSIS — I2510 Atherosclerotic heart disease of native coronary artery without angina pectoris: Secondary | ICD-10-CM | POA: Diagnosis not present

## 2016-02-18 DIAGNOSIS — H6120 Impacted cerumen, unspecified ear: Secondary | ICD-10-CM | POA: Diagnosis not present

## 2016-02-18 DIAGNOSIS — Z0000 Encounter for general adult medical examination without abnormal findings: Secondary | ICD-10-CM | POA: Diagnosis not present

## 2016-02-18 DIAGNOSIS — M8580 Other specified disorders of bone density and structure, unspecified site: Secondary | ICD-10-CM | POA: Diagnosis not present

## 2016-02-18 DIAGNOSIS — M79604 Pain in right leg: Secondary | ICD-10-CM

## 2016-02-18 DIAGNOSIS — E039 Hypothyroidism, unspecified: Secondary | ICD-10-CM | POA: Diagnosis not present

## 2016-02-21 DIAGNOSIS — Z1231 Encounter for screening mammogram for malignant neoplasm of breast: Secondary | ICD-10-CM | POA: Diagnosis not present

## 2016-02-21 DIAGNOSIS — M8589 Other specified disorders of bone density and structure, multiple sites: Secondary | ICD-10-CM | POA: Diagnosis not present

## 2016-02-22 ENCOUNTER — Telehealth: Payer: Self-pay | Admitting: Diagnostic Neuroimaging

## 2016-02-24 NOTE — Telephone Encounter (Signed)
Ok to see any provider. It has been more than 3 years. -VRP

## 2016-03-02 ENCOUNTER — Inpatient Hospital Stay (HOSPITAL_COMMUNITY): Admission: RE | Admit: 2016-03-02 | Payer: Medicare Other | Source: Ambulatory Visit

## 2016-03-06 ENCOUNTER — Other Ambulatory Visit: Payer: Self-pay | Admitting: Cardiology

## 2016-03-13 ENCOUNTER — Ambulatory Visit (HOSPITAL_COMMUNITY)
Admission: RE | Admit: 2016-03-13 | Discharge: 2016-03-13 | Disposition: A | Discharge status: Home / Self Care | Payer: Medicare Other | Source: Ambulatory Visit | Attending: Cardiovascular Disease | Admitting: Cardiovascular Disease

## 2016-03-13 DIAGNOSIS — M79604 Pain in right leg: Secondary | ICD-10-CM | POA: Insufficient documentation

## 2016-03-13 DIAGNOSIS — M79605 Pain in left leg: Secondary | ICD-10-CM | POA: Insufficient documentation

## 2016-03-14 ENCOUNTER — Encounter: Payer: Self-pay | Admitting: Physician Assistant

## 2016-03-15 ENCOUNTER — Ambulatory Visit (INDEPENDENT_AMBULATORY_CARE_PROVIDER_SITE_OTHER): Payer: Medicare Other | Admitting: Neurology

## 2016-03-15 ENCOUNTER — Encounter: Payer: Self-pay | Admitting: Neurology

## 2016-03-15 VITALS — BP 121/74 | HR 80 | Ht 60.0 in | Wt 168.8 lb

## 2016-03-15 DIAGNOSIS — E538 Deficiency of other specified B group vitamins: Secondary | ICD-10-CM

## 2016-03-15 DIAGNOSIS — M48062 Spinal stenosis, lumbar region with neurogenic claudication: Secondary | ICD-10-CM

## 2016-03-15 DIAGNOSIS — G5710 Meralgia paresthetica, unspecified lower limb: Secondary | ICD-10-CM | POA: Diagnosis not present

## 2016-03-15 DIAGNOSIS — M5416 Radiculopathy, lumbar region: Secondary | ICD-10-CM

## 2016-03-15 DIAGNOSIS — I2510 Atherosclerotic heart disease of native coronary artery without angina pectoris: Secondary | ICD-10-CM

## 2016-03-15 NOTE — Patient Instructions (Addendum)
Remember to drink plenty of fluid, eat healthy meals and do not skip any meals. Try to eat protein with a every meal and eat a healthy snack such as fruit or nuts in between meals. Try to keep a regular sleep-wake schedule and try to exercise daily, particularly in the form of walking, 20-30 minutes a day, if you can.   As far as your medications are concerned, I would like to suggest: Increase Gabapentin to 600mg  twice a day  As far as diagnostic testing: MRI lumbar spine and possibly emg/ncs, recommend either epidural steroid injections or lateral femoral cutaneous nerve block and stretching exercises  I would like to see you back as needed, sooner if we need to. Please call us with any interim questions, concerns, problems, updates or refill requests.   Our phone number is [phone]. We also have an after hours call service for urgent matters and there is a physician on-call for urgent questions. For any emergencies you know to call 911 or go to the nearest emergency room    Lateral Femoral Cutaneous Nerve Block Patient Information  Description: The lateral femoral cutaneous nerve of the thigh is a purely sensory nerve that can become entrapped or irritated for a number of reasons.  The pain associated with this condition is called meralgia paraesthetica.  Patients affected with this syndrome have burning pain or abnormal sensation along the lateral aspect of the thigh.  The pain can be worsened by prolonged walking, standing, or constrictive garments around the house.   The diagnosis can be confirmed and treatment initiated by blocking the nerve with local anesthetic (like Novocaine).  At times, a steroid solution may be injected at the same time.  The site of injection is through a tiny needle in the left, lower quadrant of the abdomen.   The entire block usually lasts less than 5 minutes.  Conditions which may be treated by lateral femoral cutaneous nerve block:   Meralagia  paraesthetica  Preparation for the injection:  1. Do not eat any solid food or dairy products within 8 hours of your appointment.  2. You may drink clear liquids up to 3 hours before appointment.  Clear liquids include water, black coffee, juice or soda. No milk or cream please. 3. You may take your regular medication, including pain medications, with a sip of water before your appointment.  Diabetics should hold regular insulin (if taken separately) and take 1/2 normal NPH dose the morning of the procedure.  Carry some sugar containing items with you to your appointment. 4. A driver must accompany you and be prepared to drive you home after your procedure. 5. Bring all you current medications with you 6. An IV may be inserted and sedation may be given at the discretion of the physician. 7. A blood pressure cuff, EKG and other monitors will often be applied during the procedure.  Some patients may need to have extra oxygen administered for a short period. 8. You will be asked to provide medical information, including your allergies and medications, prior to the procedure.  We must know immediately if you are taking blood thinners (like Coumadin/Warfarin) or if you allergic to IV iodine contrast (dye)  We must know if you could possible be pregnant.   Possible side-effects:   Bleeding from needle site  Infection (rate, may require surgery)  Nerve injury (rare)  Numbness and Tingling (temporary)  Light-headedness (temporary)  Pain at injection site (several day)  Decreased blood pressure (rare, temporary)  Weakness in leg (temporary)  Call if you experience:  Hives or difficulty breathing (go to the emergency room)  Inflammation or drainage at the injection site(s)  Please note:  Although the local anesthetic injected can often make your leg feel good for several hours after the injection,  The pain may return.  It takes 3-7 days for steroids to work.  You may not notice any  pain relief for at least one week.  If effective, we will often do a series of injections spaced 3-6 weeks apart to maximally decrease your pain.  If you have any questions, please cll [phone] [facility]

## 2016-03-15 NOTE — Progress Notes (Signed)
[facility]    Provider:  Dr Jaynee Eagles Referring Provider: Hulan Fess, MD Primary Care Physician:  Gennette Pac, MD  CC:  Bilateral leg pain  HPI:  Gabrielle [name] is a 71 y.o. female here as a referral from Dr. Rex Kras for bilateral leg pain. She has a past medical history of impaired glucose tolerance, CKD, CAD, lumbar spondylolisthesis, hypertension, hyperlipidemia, hypothyroidism, obesity, peripheral neuropathy. She is having new pain for 2 months she has intermittent sharp shooting pains right > left in the lateral upper thighs. No inciting events, no trauma, no falls, she has chronic back pain. Bilateral dopplers of the lower extremities were normal. No difficulty climbing stairs or getting out of low seats or cars but difficulty walking long distances. She can only walk a certain distance and her legs stated hurting and she is almost in tears with burning. Pain also wakes her up at night and makes her jump. The symptoms are not worsening but also not improving. Worse with activity but also worse with prolonged sitting or prolonged activity. She has chronic low back pain. Symptoms are more the right than the left. Also numbness. No other focal neurologic deficits, associated symptoms, inciting events or modifiable factors.  Reviewed notes, labs and imaging from outside physicians, which showed:   CBC with differential normal, CMP showed elevated glucose 120, EGFR 57, BUN 21, and creatinine is 0.97 in January 2018.   She was evaluated in 2014 by neurology for bilateral foot numbness and tingling since 2009. Patient has a history of lumbar radiculopathy status post surgery 2013 L4-L5 lumbar decompression and fusion. At that time she was having bilateral severe hip and sciatic pain which did improve following surgery. Pain in the feet has not significantly changed since lumbar spine surgery. She drinks 2-3 glasses of wine on a daily basis for years. Exam showed decreased  pinprick, temperature in the toes, vibration less than 5 seconds at the toes with intact proprioception. Reflexes were trace in the uppers and absent in the lowers. Romberg was positive. The neuropathy lab was ordered, she was advised to cut down on her alcohol, started daily multivitamin, and a trial of compounded neuropathy cream.   Review of Systems: Patient complains of symptoms per HPI as well as the following symptoms: easy bruising, numbness. Pertinent negatives per HPI. All others negative.   Social History   Social History  . Marital status: Married    Spouse name: N/A  . Number of children: 3  . Years of education: BSN+   Occupational History  . Retired    Social History Main Topics  . Smoking status: Former Smoker    Packs/day: 1.00    Years: 19.00    Types: Cigarettes    Quit date: 08/14/1983  . Smokeless tobacco: Never Used  . Alcohol use 1.2 oz/week    2 Glasses of wine per week     Comment: per day  . Drug use: No  . Sexual activity: Yes    Partners: Male     Comment: Married   Other Topics Concern  . Not on file   Social History Narrative   Lives at home w/ her husband and granddaughter   Right-handed   Caffeine: 3 cups of tea daily    Family History  Problem Relation Age of Onset  . Aneurysm Mother   . Heart disease Father   . Heart disease Brother   . Neuropathy Neg Hx     Past Medical History:  Diagnosis  Date  . Arthritis 01-17-11   degenerative disc disease-all joints  . Bursitis   . Cholesterol serum elevated 01-17-11   tx. meds  . GERD (gastroesophageal reflux disease) 01-17-11   tx. omeprazole  . Heart murmur    slight murmur  . High cholesterol   . Hypertension 01-17-11   tx. meds  . Hypothyroidism   . Leg pain    ABIs 2/18: normal bilaterally  . Osteoarthritis   . Peripheral neuropathy ([place])   . Seasonal allergies   . Sinus problem 01-17-11   tx. Claritin D  . ST elevation (STEMI) myocardial infarction involving left  circumflex coronary artery ([place]) 08/21/2015    Past Surgical History:  Procedure Laterality Date  . ABDOMINAL HYSTERECTOMY  01-17-11   '93-Hysterectomy-heavy bleeding  . BACK SURGERY  01-17-11   04-20-10-T-Lift lumbar fusion  . [facility] PROCEDURE  01-17-11   Bladder sling  . CARDIAC CATHETERIZATION N/A 08/21/2015   Procedure: Left Heart Cath and Coronary Angiography;  Surgeon: Sherren Mocha, MD;  Location: [facility];  Service: Cardiovascular;  Laterality: N/A;  . CARDIAC CATHETERIZATION N/A 08/21/2015   Procedure: Coronary Stent Intervention;  Surgeon: Sherren Mocha, MD;  Location: [facility];  Service: Cardiovascular;  Laterality: N/A;  . CARDIAC CATHETERIZATION N/A 08/23/2015   Procedure: Coronary Stent Intervention;  Surgeon: Leonie Man, MD;  Location: [facility];  Service: Cardiovascular;  Laterality: N/A;  promus 2.5x16 in RCA  . CARDIAC CATHETERIZATION N/A 08/23/2015   Procedure: Left Heart Cath and Coronary Angiography;  Surgeon: Leonie Man, MD;  Location: [facility];  Service: Cardiovascular;  Laterality: N/A;  . CARPAL TUNNEL RELEASE  01-17-11   '02-left, also right  . COLONOSCOPY    . HARDWARE REMOVAL Right 05/15/2013   Procedure: REMOVAL RIGHT L4-L5 PEDICLE SCREWS AND ROD ;  Surgeon: Melina Schools, MD;  Location: [facility];  Service: Orthopedics;  Laterality: Right;  . JOINT REPLACEMENT  01-17-11    RTKA' 02  . LAPAROSCOPIC APPENDECTOMY N/A 08/13/2013   Procedure: APPENDECTOMY LAPAROSCOPIC;  Surgeon: Pedro Earls, MD;  Location: [facility];  Service: General;  Laterality: N/A;  . TONSILLECTOMY  01-17-11   child  . TOTAL KNEE ARTHROPLASTY  01/19/2011   Procedure: TOTAL KNEE ARTHROPLASTY;  Surgeon: Johnn Hai;  Location: [facility];  Service: Orthopedics;  Laterality: Left;    Current Outpatient Prescriptions  Medication Sig Dispense Refill  . acetaminophen (TYLENOL) 500 MG tablet Take 1,500 mg by mouth daily.     Marland Kitchen aspirin EC 81 MG tablet  Take 81 mg by mouth at bedtime.     . benazepril (LOTENSIN) 20 MG tablet Take 20 mg by mouth 2 (two) times daily.     Marland Kitchen BIOTIN PO Take 1 tablet by mouth at bedtime.     . Calcium Carbonate-Vitamin D (CALCIUM 600 + D PO) Take 1 tablet by mouth 2 (two) times daily.      . cholecalciferol (VITAMIN D) 1000 units tablet Take 1,000 Units by mouth daily.    Marland Kitchen docusate sodium (COLACE) 100 MG capsule Take 100 mg by mouth daily.     . fish oil-omega-3 fatty acids 1000 MG capsule Take 1 g by mouth at bedtime.     . gabapentin (NEURONTIN) 300 MG capsule Take 300 mg by mouth 2 (two) times daily.  1  . glucosamine-chondroitin 500-400 MG tablet Take 2 tablets by mouth daily with breakfast.    . levothyroxine (SYNTHROID, LEVOTHROID) 75 MCG  tablet Take 75 mcg by mouth daily before breakfast.     . loratadine-pseudoephedrine (CLARITIN-D 24-HOUR) 10-240 MG per 24 hr tablet Take 1 tablet by mouth at bedtime.     . Melatonin 3 MG TABS Take 6 mg by mouth at bedtime.     . metoprolol tartrate (LOPRESSOR) 25 MG tablet TAKE ONE-HALF TABLET BY MOUTH 2 (TWO) TIMES DAILY. 90 tablet 0  . nitroGLYCERIN (NITROSTAT) 0.4 MG SL tablet Place 1 tablet (0.4 mg total) under the tongue every 5 (five) minutes x 3 doses as needed for chest pain. 25 tablet 2  . omeprazole (PRILOSEC) 20 MG capsule Take 20 mg by mouth daily with breakfast.     . polyethylene glycol (MIRALAX / GLYCOLAX) packet Take 17 g by mouth daily as needed (for constipation). Mix with 8 oz liquid and drink    . potassium chloride (K-DUR,KLOR-CON) 10 MEQ tablet Take 10 mEq by mouth daily.     . rosuvastatin (CRESTOR) 20 MG tablet Take 1 tablet (20 mg total) by mouth at bedtime. 30 tablet 12  . ticagrelor (BRILINTA) 90 MG TABS tablet Take 1 tablet (90 mg total) by mouth 2 (two) times daily. 60 tablet 12  . triamterene-hydrochlorothiazide (MAXZIDE-25) 37.5-25 MG tablet Take 1 tablet by mouth daily.  1   No current facility-administered medications for this visit.      Allergies as of 03/15/2016 - Review Complete 03/15/2016  Allergen Reaction Noted  . Penicillins Other (See Comments) 01/12/2011  . Pravastatin Other (See Comments) 08/13/2013    Vitals: BP 121/74   Pulse 80   Ht 5' (1.524 m)   Wt 168 lb 12.8 oz (76.6 kg)   BMI 32.97 kg/m  Last Weight:  Wt Readings from Last 1 Encounters:  03/15/16 168 lb 12.8 oz (76.6 kg)   Last Height:   Ht Readings from Last 1 Encounters:  03/15/16 5' (1.524 m)    Physical exam: Exam: Gen: NAD, conversant, well nourised, obese, well groomed                     CV: RRR, no MRG. No Carotid Bruits. No peripheral edema, warm, nontender Eyes: Conjunctivae clear without exudates or hemorrhage  Neuro: Detailed Neurologic Exam  Speech:    Speech is normal; fluent and spontaneous with normal comprehension.  Cognition:    The patient is oriented to person, place, and time;     recent and remote memory intact;     language fluent;     normal attention, concentration,     fund of knowledge Cranial Nerves:    The pupils are equal, round, and reactive to light. The fundi are normal and spontaneous venous pulsations are present. Visual fields are full to finger confrontation. Extraocular movements are intact. Trigeminal sensation is intact and the muscles of mastication are normal. The face is symmetric. The palate elevates in the midline. Hearing intact. Voice is normal. Shoulder shrug is normal. The tongue has normal motion without fasciculations.   Coordination:    No dysmetria   Gait:    Normal native gait  Motor Observation:    No asymmetry, no atrophy, and no involuntary movements noted. Tone:    Normal muscle tone.    Posture:    Posture is normal. normal erect    Strength: Right hip flexion 4/5, left  Hip flexion 3+/5     Sensation:  Dysesthesias lateral thighs       Reflex Exam:  DTR's:    Deep  tendon reflexes in the upper and lower extremities are symmetrical bilaterally.    Toes:    The toes are downgoing bilaterally.   Clonus:    Clonus is absent.  Assessment/Plan:  71 year old female with bilateral upper leg pain and hip flexion weakness with gait difficulty.  MRI lumbar spine to evaluate for lumbar stenosis, neurogenic claudication and radiculopathy for surgical evaluation or ESI. Possibly emg/ncs if further investigation warranted after MRI l-spine Physical therapy (DDx lumbar radic, meralgia paresthetica) Increase Gabapentin Recommend epidural steroid injections vs lateral femoral cutaneous nerve blocks pending results of MRI lumbar spine Gave patient stretching exercises for Meralgia Paresthetica Check B12 and MMA  Cc: Dr. Simmie Davies, [facility] [address] [place], [place] [address]  Phone [phone] Fax [phone]

## 2016-03-16 ENCOUNTER — Telehealth: Payer: Self-pay | Admitting: *Deleted

## 2016-03-16 ENCOUNTER — Encounter: Payer: Self-pay | Admitting: Neurology

## 2016-03-16 NOTE — Telephone Encounter (Signed)
Per Sharlette Dense, spoke with patient's husband, Ronalee Belts, on [place] and informed him her Vit B12 level is normal. He verbalized understanding, appreciation.

## 2016-03-17 LAB — METHYLMALONIC ACID, SERUM: Methylmalonic Acid: 141 nmol/L (ref 0–378)

## 2016-03-17 LAB — VITAMIN B12: Vitamin B-12: 430 pg/mL (ref 232–1245)

## 2016-03-28 ENCOUNTER — Ambulatory Visit
Admission: RE | Admit: 2016-03-28 | Discharge: 2016-03-28 | Disposition: A | Discharge status: Home / Self Care | Payer: Medicare Other | Source: Ambulatory Visit | Attending: Neurology | Admitting: Neurology

## 2016-03-28 DIAGNOSIS — M5416 Radiculopathy, lumbar region: Secondary | ICD-10-CM | POA: Diagnosis not present

## 2016-03-28 DIAGNOSIS — G5710 Meralgia paresthetica, unspecified lower limb: Secondary | ICD-10-CM

## 2016-03-28 DIAGNOSIS — M48061 Spinal stenosis, lumbar region without neurogenic claudication: Secondary | ICD-10-CM | POA: Diagnosis not present

## 2016-03-28 DIAGNOSIS — M48062 Spinal stenosis, lumbar region with neurogenic claudication: Secondary | ICD-10-CM

## 2016-03-30 ENCOUNTER — Telehealth: Payer: Self-pay | Admitting: Neurology

## 2016-03-30 DIAGNOSIS — M48061 Spinal stenosis, lumbar region without neurogenic claudication: Secondary | ICD-10-CM

## 2016-03-30 NOTE — Telephone Encounter (Signed)
Reviewed abnormal MRI results w/ patient. She is agreeable w/ neurosurg ref to [place]. Orders entered per Dr. Jaynee Eagles.

## 2016-03-30 NOTE — Telephone Encounter (Signed)
Patient has severe lumbar spinal stenosis. She needs to see a Psychologist, sport and exercise as soon as possible. I would like to send her to [facility]. Please discuss thanks.   On axial views:  L1-2: disc bulging, facet hypertrophy with no spinal stenosis or foraminal narrowing  L2-3: disc bulging, facet hypertrophy, posterior epidural lipomatosis, with moderate spinal stenosis and mild biforaminal stenosis  L3-4: disc bulging, facet hypertrophy, posterior epidural lipomatosis, with severe spinal stenosis and moderate biforaminal stenosis; left sided pedicle screw L4-5: interbody fusion device, left sided pedicle screw, with no spinal stenosis or foraminal narrowing  L5-S1: disc bulging with no spinal stenosis or foraminal narrowing   Limited views of the aorta, kidneys, iliopsoas muscles and sacroiliac joints are unremarkable.    IMPRESSION:  Abnormal MRI lumbar spine (without) demonstrating: 1. At L3-4: disc bulging, facet hypertrophy, posterior epidural lipomatosis, with severe spinal stenosis and moderate biforaminal stenosis; left sided pedicle screw. 2. At L2-3: disc bulging, facet hypertrophy, posterior epidural lipomatosis, with moderate spinal stenosis and mild biforaminal stenosis.

## 2016-03-30 NOTE — Addendum Note (Signed)
Addended by: Monte Fantasia on: 03/30/2016 05:56 PM   Modules accepted: Orders

## 2016-04-19 DIAGNOSIS — M48062 Spinal stenosis, lumbar region with neurogenic claudication: Secondary | ICD-10-CM | POA: Diagnosis not present

## 2016-04-19 DIAGNOSIS — M545 Low back pain: Secondary | ICD-10-CM | POA: Diagnosis not present

## 2016-04-19 DIAGNOSIS — G8929 Other chronic pain: Secondary | ICD-10-CM | POA: Diagnosis not present

## 2016-04-26 ENCOUNTER — Telehealth: Payer: Self-pay

## 2016-04-26 NOTE — Telephone Encounter (Signed)
Received faxed consult from [facility]. Given to Dr. Jaynee Eagles for review. Dr. Rolena Infante notes that "the patient does have spinal stenosis at the adjacent segment. Unfortunately, she had an MI in July 2017 and is on anticoagulation s/p 2 stents. At this point, she will not be cleared medically for surgical intervention nor can she have injection therapy... I will see her again in July and reevaluate her."

## 2016-05-23 DIAGNOSIS — H8112 Benign paroxysmal vertigo, left ear: Secondary | ICD-10-CM | POA: Diagnosis not present

## 2016-06-04 ENCOUNTER — Other Ambulatory Visit: Payer: Self-pay | Admitting: Cardiology

## 2016-07-30 ENCOUNTER — Encounter: Payer: Self-pay | Admitting: Physician Assistant

## 2016-07-31 ENCOUNTER — Other Ambulatory Visit: Payer: Self-pay | Admitting: Cardiovascular Disease

## 2016-07-31 MED ORDER — TICAGRELOR 90 MG PO TABS: 90.0000 mg | ORAL_TABLET | Freq: Two times a day (BID) | ORAL | 4 refills | Status: DC

## 2016-07-31 MED ORDER — TICAGRELOR 90 MG PO TABS: 90.0000 mg | ORAL_TABLET | Freq: Two times a day (BID) | ORAL | 0 refills | Status: DC

## 2016-07-31 NOTE — Telephone Encounter (Signed)
Pt's medication was sent to pt's pharmacy as requested. Confirmation received.  °

## 2016-09-25 ENCOUNTER — Ambulatory Visit: Payer: Medicare Other | Admitting: Cardiovascular Disease

## 2016-10-06 NOTE — Progress Notes (Signed)
Cardiology Office Note    Date:  10/10/2016   ID:  Gabrielle [name], DOB 1945-03-18, MRN [id number]  PCP:  Hulan Fess, MD  Cardiologist:  Dr. Burt Knack  Chief Complaint: CAD followup  History of Present Illness:   Gabrielle [name] is a 71 y.o. female with hx of CAD s/p DES to OM1 & staged PCI with DES to RCA, HTN and HLD presents for follow up.   She presented with an inferolateral STEMI in 7/17. At cardiac catheterization, she had total occlusion of the OM1 which was treated with a DES. She also had severe stenosis of the RCA which was treated with staged intervention.  Last seen by APP 02/15/16. She had bilateral thigh pain with exertion.  She gets some discomfort at rest. Follow up ABI is normal.   Here today for follow up. Patient denies any exertional chest pain or shortness of breath. No orthopnea, PND, syncope, lower extremity edema, melena, palpitation or blood in her stool or urine. She does complains of all extremity coldness, especially in evening time. She does have a history of hypothyroidism and takes supplement. She is overdue for blood work. Also complains of intermittent dizziness, mostly during quick movements. She has a history of vertigo.    Past Medical History:  Diagnosis Date  . Arthritis 01-17-11   degenerative disc disease-all joints  . Bursitis   . Cholesterol serum elevated 01-17-11   tx. meds  . GERD (gastroesophageal reflux disease) 01-17-11   tx. omeprazole  . Heart murmur    slight murmur  . High cholesterol   . Hypertension 01-17-11   tx. meds  . Hypothyroidism   . Leg pain    ABIs 2/18: normal bilaterally  . Osteoarthritis   . Peripheral neuropathy   . Seasonal allergies   . Sinus problem 01-17-11   tx. Claritin D  . ST elevation (STEMI) myocardial infarction involving left circumflex coronary artery ([place]) 08/21/2015    Past Surgical History:  Procedure Laterality Date  . ABDOMINAL HYSTERECTOMY  01-17-11   '93-Hysterectomy-heavy  bleeding  . BACK SURGERY  01-17-11   04-20-10-T-Lift lumbar fusion  . [facility] PROCEDURE  01-17-11   Bladder sling  . CARDIAC CATHETERIZATION N/A 08/21/2015   Procedure: Left Heart Cath and Coronary Angiography;  Surgeon: Sherren Mocha, MD;  Location: [place] [facility];  Service: Cardiovascular;  Laterality: N/A;  . CARDIAC CATHETERIZATION N/A 08/21/2015   Procedure: Coronary Stent Intervention;  Surgeon: Sherren Mocha, MD;  Location: [place] [facility];  Service: Cardiovascular;  Laterality: N/A;  . CARDIAC CATHETERIZATION N/A 08/23/2015   Procedure: Coronary Stent Intervention;  Surgeon: Leonie Man, MD;  Location: [place] [facility];  Service: Cardiovascular;  Laterality: N/A;  promus 2.5x16 in RCA  . CARDIAC CATHETERIZATION N/A 08/23/2015   Procedure: Left Heart Cath and Coronary Angiography;  Surgeon: Leonie Man, MD;  Location: [place] [facility];  Service: Cardiovascular;  Laterality: N/A;  . CARPAL TUNNEL RELEASE  01-17-11   '02-left, also right  . COLONOSCOPY    . HARDWARE REMOVAL Right 05/15/2013   Procedure: REMOVAL RIGHT L4-L5 PEDICLE SCREWS AND ROD ;  Surgeon: Melina Schools, MD;  Location: [place];  Service: Orthopedics;  Laterality: Right;  . JOINT REPLACEMENT  01-17-11    RTKA' 02  . LAPAROSCOPIC APPENDECTOMY N/A 08/13/2013   Procedure: APPENDECTOMY LAPAROSCOPIC;  Surgeon: Pedro Earls, MD;  [facility];  Service: General;  Laterality: N/A;  . TONSILLECTOMY  01-17-11   child  .  TOTAL KNEE ARTHROPLASTY  01/19/2011   Procedure: TOTAL KNEE ARTHROPLASTY;  Surgeon: Johnn Hai;  Location: [facility];  Service: Orthopedics;  Laterality: Left;    Current Medications: Prior to Admission medications   Medication Sig Start Date End Date Taking? Authorizing Provider  acetaminophen (TYLENOL) 500 MG tablet Take 1,500 mg by mouth daily.     [provider]  aspirin EC 81 MG tablet Take 81 mg by mouth at bedtime.     [provider]  benazepril (LOTENSIN) 20  MG tablet Take 20 mg by mouth 2 (two) times daily.     [provider]  BIOTIN PO Take 1 tablet by mouth at bedtime.     [provider]  Calcium Carbonate-Vitamin D (CALCIUM 600 + D PO) Take 1 tablet by mouth 2 (two) times daily.      [provider]  cholecalciferol (VITAMIN D) 1000 units tablet Take 1,000 Units by mouth daily.    [provider]  docusate sodium (COLACE) 100 MG capsule Take 100 mg by mouth daily.     [provider]  fish oil-omega-3 fatty acids 1000 MG capsule Take 1 g by mouth at bedtime.     [provider]  gabapentin (NEURONTIN) 300 MG capsule Take 300 mg by mouth 2 (two) times daily. 08/03/15   [provider]  glucosamine-chondroitin 500-400 MG tablet Take 2 tablets by mouth daily with breakfast.    [provider]  levothyroxine (SYNTHROID, LEVOTHROID) 75 MCG tablet Take 75 mcg by mouth daily before breakfast.     [provider]  loratadine-pseudoephedrine (CLARITIN-D 24-HOUR) 10-240 MG per 24 hr tablet Take 1 tablet by mouth at bedtime.     [provider]  Melatonin 3 MG TABS Take 6 mg by mouth at bedtime.     [provider]  metoprolol tartrate (LOPRESSOR) 25 MG tablet TAKE ONE-HALF TABLET BY MOUTH 2 (TWO) TIMES DAILY. 06/06/16   Sherren Mocha, MD  nitroGLYCERIN (NITROSTAT) 0.4 MG SL tablet Place 1 tablet (0.4 mg total) under the tongue every 5 (five) minutes x 3 doses as needed for chest pain. 08/24/15   Arbutus Leas, NP  omeprazole (PRILOSEC) 20 MG capsule Take 20 mg by mouth daily with breakfast.     [provider]  polyethylene glycol (MIRALAX / GLYCOLAX) packet Take 17 g by mouth daily as needed (for constipation). Mix with 8 oz liquid and drink    [provider]  potassium chloride (K-DUR,KLOR-CON) 10 MEQ tablet Take 10 mEq by mouth daily.  11/02/12   [provider]  rosuvastatin (CRESTOR) 20 MG tablet Take 1 tablet (20 mg total) by mouth  at bedtime. 08/24/15   Arbutus Leas, NP  ticagrelor (BRILINTA) 90 MG TABS tablet Take 1 tablet (90 mg total) by mouth 2 (two) times daily. 07/31/16   Sherren Mocha, MD  triamterene-hydrochlorothiazide (MAXZIDE-25) 37.5-25 MG tablet Take 1 tablet by mouth daily. 06/08/15   [provider]    Allergies:   Penicillins and Pravastatin   Social History   Social History  . Marital status: Married    Spouse name: N/A  . Number of children: 3  . Years of education: BSN+   Occupational History  . Retired    Social History Main Topics  . Smoking status: Former Smoker    Packs/day: 1.00    Years: 19.00    Types: Cigarettes    Quit date: 08/14/1983  . Smokeless tobacco: Never Used  .  Alcohol use 1.2 oz/week    2 Glasses of wine per week     Comment: per day  . Drug use: No  . Sexual activity: Yes    Partners: Male     Comment: Married   Other Topics Concern  . None   Social History Narrative   Lives at home w/ her husband and granddaughter   Right-handed   Caffeine: 3 cups of tea daily     Family History:  The patient's family history includes Aneurysm in her mother; Heart disease in her brother and father.   ROS:   Please see the history of present illness.    ROS All other systems reviewed and are negative.   PHYSICAL EXAM:   VS:  BP 116/62   Pulse 74   Ht 5' (1.524 m)   SpO2 98%    GEN: Well nourished, well developed, in no acute distress  HEENT: normal  Neck: no JVD, carotid bruits, or masses Cardiac: RRR; no murmurs, rubs, or gallops,no edema  Respiratory:  clear to auscultation bilaterally, normal work of breathing GI: soft, nontender, nondistended, + BS MS: no deformity or atrophy  Skin: warm and dry, no rash Neuro:  Alert and Oriented x 3, Strength and sensation are intact Psych: euthymic mood, full affect  Wt Readings from Last 3 Encounters:  03/15/16 168 lb 12.8 oz (76.6 kg)  02/15/16 168 lb (76.2 kg)  01/28/16 165 lb 12.6 oz (75.2 kg)       Studies/Labs Reviewed:   EKG:  EKG is ordered today.  The ekg ordered today demonstrates NSR at rate of 74 bpm. No change.   Recent Labs: 10/12/2015: ALT 24   Lipid Panel    Component Value Date/Time   CHOL 180 10/12/2015 0835   TRIG 138 10/12/2015 0835   HDL 74 10/12/2015 0835   CHOLHDL 2.4 10/12/2015 0835   VLDL 28 10/12/2015 0835   LDLCALC 78 10/12/2015 0835    Additional studies/ records that were reviewed today include:   Echocardiogram: 07/2015 Study Conclusions  - Left ventricle: The cavity size was normal. Systolic function was   normal. The estimated ejection fraction was in the range of 55%   to 60%. Wall motion was normal; there were no regional wall   motion abnormalities. Left ventricular diastolic function   parameters were normal. - Atrial septum: No defect or patent foramen ovale was identified.  Coronary Stent Intervention   07/2015  Left Heart Cath and Coronary Angiography  Conclusion     Mid RCA lesion, 85 %stenosed.  A STENT PROMUS PREM MR 2.5X16 drug eluting stent was successfully placed. Post intervention, there is a 0% residual stenosis.  1st Mrg stent appears to be widely patent  Ost Cx to Prox Cx lesion, 70 %stenosed.    Successful focal PCI of mid RCA lesion with DES stent. Reviewed with Dr. Burt Knack, we both feel that the osteo-proximal circumflex lesion is a very difficult lesion to consider PCI as there is really minimal landing zone for stent.  This also somewhat calcified and likely chronic. The patient had not been having anginal symptoms prior to coming in, therefore it is unlikely to be flow-limiting. Plan for now is medical management.   Plan: Transfer to 6 central post procedure unit for TR band removal. Continue dual antiplatelet therapy. Continue aggressive risk factor modification.  Expected discharge tomorrow. Will need follow-up with Dr. Burt Knack       ASSESSMENT & PLAN:    1. CAD s/p  DES to OM1 & staged PCI  with DES to RCA - No angina or dyspnea. Continue aspirin, statin, beta blocker. She is > 12 months of her last PCI. Discontinue Brillinta.   2. HTN - Stable and well controlled. Continue current regimen.   3. HLD - 10/12/2015: Cholesterol 180; HDL 74; LDL Cholesterol 78; Triglycerides 138; VLDL 28  - LDL 104 on 02/18/16.  Due for labs. She will follow up with PCP tomorrow. She has eaten today. LDL goal less than 70. If not at goal, consider increasing statin.   4.  hypothyroidism  - He is overdue for lab work. Complains of cold upper and lower extremities. She will go for blood work tomorrow at PCP office.   5. Dizziness  - Intermittent. Likely due to rapid head movement. Orthostatic negative today. Advised to get balance prior to movement. This is chronic and stable. Does have a history of vertigo. Follow-up with PCP.     Medication Adjustments/Labs and Tests Ordered: Current medicines are reviewed at length with the patient today.  Concerns regarding medicines are outlined above.  Medication changes, Labs and Tests ordered today are listed in the Patient Instructions below. Patient Instructions  Medication Instructions:   STOP TAKING BIRLINTA   If you need a refill on your cardiac medications before your next appointment, please call your pharmacy.  Labwork: NONE ORDERED  TODAY    Testing/Procedures: NONE ORDERED  TODAY    Follow-Up: Your physician wants you to follow-up in:  IN 6   MONTHS WITH DR Burt Knack You will receive a reminder letter in the mail two months in advance. If you don't receive a letter, please call our office to schedule the follow-up appointment.      Any Other Special Instructions Will Be Listed Below (If Applicable).                                                                                                                                                      Jarrett Soho, [place]  10/10/2016 10:06 AM    [facility] [address], [place], [place]  [address] Phone: [phone]; Fax: [phone]

## 2016-10-10 ENCOUNTER — Ambulatory Visit (INDEPENDENT_AMBULATORY_CARE_PROVIDER_SITE_OTHER): Payer: Medicare Other | Admitting: Physician Assistant

## 2016-10-10 ENCOUNTER — Encounter: Payer: Self-pay | Admitting: Physician Assistant

## 2016-10-10 VITALS — BP 116/62 | HR 74 | Ht 60.0 in

## 2016-10-10 DIAGNOSIS — E039 Hypothyroidism, unspecified: Secondary | ICD-10-CM

## 2016-10-10 DIAGNOSIS — R42 Dizziness and giddiness: Secondary | ICD-10-CM

## 2016-10-10 DIAGNOSIS — E785 Hyperlipidemia, unspecified: Secondary | ICD-10-CM

## 2016-10-10 DIAGNOSIS — I10 Essential (primary) hypertension: Secondary | ICD-10-CM

## 2016-10-10 DIAGNOSIS — I2510 Atherosclerotic heart disease of native coronary artery without angina pectoris: Secondary | ICD-10-CM

## 2016-10-10 NOTE — Patient Instructions (Signed)
Medication Instructions:   STOP TAKING BIRLINTA   If you need a refill on your cardiac medications before your next appointment, please call your pharmacy.  Labwork: NONE ORDERED  TODAY    Testing/Procedures: NONE ORDERED  TODAY    Follow-Up: Your physician wants you to follow-up in:  IN 6   MONTHS WITH DR Burt Knack You will receive a reminder letter in the mail two months in advance. If you don't receive a letter, please call our office to schedule the follow-up appointment.      Any Other Special Instructions Will Be Listed Below (If Applicable).

## 2016-10-11 DIAGNOSIS — R7301 Impaired fasting glucose: Secondary | ICD-10-CM | POA: Diagnosis not present

## 2016-10-11 DIAGNOSIS — I10 Essential (primary) hypertension: Secondary | ICD-10-CM | POA: Diagnosis not present

## 2016-10-11 DIAGNOSIS — E782 Mixed hyperlipidemia: Secondary | ICD-10-CM | POA: Diagnosis not present

## 2016-10-31 DIAGNOSIS — M4800 Spinal stenosis, site unspecified: Secondary | ICD-10-CM | POA: Diagnosis not present

## 2016-11-17 DIAGNOSIS — Z23 Encounter for immunization: Secondary | ICD-10-CM | POA: Diagnosis not present

## 2016-11-21 DIAGNOSIS — M545 Low back pain: Secondary | ICD-10-CM | POA: Diagnosis not present

## 2016-11-21 DIAGNOSIS — M48062 Spinal stenosis, lumbar region with neurogenic claudication: Secondary | ICD-10-CM | POA: Diagnosis not present

## 2016-11-21 DIAGNOSIS — G8929 Other chronic pain: Secondary | ICD-10-CM | POA: Diagnosis not present

## 2016-11-29 ENCOUNTER — Other Ambulatory Visit: Payer: Self-pay | Admitting: Cardiovascular Disease

## 2016-12-05 DIAGNOSIS — M7071 Other bursitis of hip, right hip: Secondary | ICD-10-CM | POA: Diagnosis not present

## 2016-12-05 DIAGNOSIS — M4800 Spinal stenosis, site unspecified: Secondary | ICD-10-CM | POA: Diagnosis not present

## 2016-12-09 DIAGNOSIS — M48062 Spinal stenosis, lumbar region with neurogenic claudication: Secondary | ICD-10-CM | POA: Diagnosis not present

## 2016-12-29 DIAGNOSIS — G8929 Other chronic pain: Secondary | ICD-10-CM | POA: Diagnosis not present

## 2016-12-29 DIAGNOSIS — M545 Low back pain: Secondary | ICD-10-CM | POA: Diagnosis not present

## 2016-12-29 DIAGNOSIS — M48062 Spinal stenosis, lumbar region with neurogenic claudication: Secondary | ICD-10-CM | POA: Diagnosis not present

## 2017-01-03 ENCOUNTER — Telehealth: Payer: Self-pay | Admitting: Cardiovascular Disease

## 2017-01-03 NOTE — Telephone Encounter (Signed)
Walk in pt Form-[facility] Clearance dropped off. Placed in Triage Box to be addressed.

## 2017-01-03 NOTE — Telephone Encounter (Signed)
° °  [facility] Pre-operative Risk Assessment    Request for surgical clearance:  1. What type of surgery is being performed? Lateral lumbar fusion  2. When is this surgery scheduled? TBS 02/08/17   3. Are there any medications that need to be held prior to surgery and how long? If there is any medications that need to be held, please inform us.   4. Practice name and name of physician performing surgery?  [facility], Dahari D. Rolena Infante, MD  5. What is your office phone and fax number? Ph# [phone], Fax# [phone] ATTN: Orson Slick  6. Anesthesia type (None, local, MAC, general) ? Not specified    Derl Barrow 01/03/2017, 9:08 AM  _________________________________________________________________   (provider comments below)

## 2017-01-06 NOTE — Telephone Encounter (Addendum)
   Primary Cardiologist: Sherren Mocha, MD  Chart reviewed as part of pre-operative protocol coverage. Patient was contacted 01/06/2017 in reference to pre-operative risk assessment for pending surgery as outlined below.  Gabrielle [name] was last seen on 10/10/16 by Robbie Lis. RCRI calculated at 0.9% for CAD. Attempted to contact patient by both phone numbers but got voicemail - asked her to call office back when we re-open on Tuesday, between 1:30-5pm to discuss how she is doing for pre-op clearance. Will also route to Dr. Burt Knack in interim to find out how long he is comfortable holding aspirin. Dr. Burt Knack, as long as patient has had no interim issues identified by phone call, how long are you comfortable holding aspirin? Last PCI 07/2015; Brilinta was stopped at 09/2016 OV. Please route response to P CV DIV PREOP only. Thanks.  Charlie Pitter, PA-C 01/06/2017, 2:19 PM

## 2017-01-10 NOTE — Telephone Encounter (Signed)
   Primary Cardiologist: Sherren Mocha, MD  Chart reviewed as part of pre-operative protocol coverage. Spoke with Mrs. Tatham and she reports doing well from a cardiac perspective, denying any recent chest pain, palpitations, or dyspnea on exertion. No further testing is indicated at this time prior to her procedure.   Awaiting information from Dr. Burt Knack regarding holding ASA.    Erma Heritage, PA-C 01/10/2017, 2:55 PM

## 2017-01-10 NOTE — Telephone Encounter (Signed)
Can hold aspirin 7 days prior to surgery if needed. Resume post-op when safe. thanks

## 2017-01-11 NOTE — Telephone Encounter (Signed)
   Primary Cardiologist: Sherren Mocha, MD  Chart reviewed as part of pre-operative protocol coverage. Patient was successfully contacted on 01/10/17 and was noted to be doing well from a cardiac perspective, denying any recent chest pain, palpitations, or dyspnea on exertion. Dr. Burt Knack has indicated the patient can hold aspirin 7 days prior to surgery, if needed. Resume aspirin post-op when safe from a treating physician standpoint. Given past medical history and time since last visit, based on ACC/AHA guidelines, JOURNIEE FELDKAMP would be at acceptable risk for the planned procedure without further cardiovascular testing.   I will route this recommendation to the requesting party via Epic fax function and remove from pre-op pool.  Please call with questions.  Christell Faith, PA-C 01/11/2017, 3:17 PM

## 2017-01-25 DIAGNOSIS — M48061 Spinal stenosis, lumbar region without neurogenic claudication: Secondary | ICD-10-CM | POA: Diagnosis not present

## 2017-01-25 DIAGNOSIS — Z6834 Body mass index (BMI) 34.0-34.9, adult: Secondary | ICD-10-CM | POA: Diagnosis not present

## 2017-01-25 DIAGNOSIS — E669 Obesity, unspecified: Secondary | ICD-10-CM | POA: Diagnosis not present

## 2017-02-09 ENCOUNTER — Telehealth: Payer: Self-pay | Admitting: Cardiovascular Disease

## 2017-02-09 NOTE — Telephone Encounter (Signed)
New message     Please refax the surgical clearance to [place]

## 2017-02-12 NOTE — Telephone Encounter (Signed)
Faxed clearance to requesting office [place]

## 2017-02-23 DIAGNOSIS — Z1231 Encounter for screening mammogram for malignant neoplasm of breast: Secondary | ICD-10-CM | POA: Diagnosis not present

## 2017-02-26 DIAGNOSIS — M545 Other chronic pain: Secondary | ICD-10-CM | POA: Insufficient documentation

## 2017-02-26 DIAGNOSIS — G8929 Other chronic pain: Secondary | ICD-10-CM | POA: Insufficient documentation

## 2017-02-26 NOTE — H&P (Addendum)
Patient ID: Gabrielle [name] MRN: [id number] DOB/AGE: 72-Feb-1947 72 y.o.  Admit date: (Not on file)  Admission Diagnoses:  Lumbar Spinal stenosis  HPI: pt is here for an H & P for extreme lumbar interbody fusion at L3-4 because of lumbar stenosis at that level.  Patient has a history of an MI in July 2017 placement of 2 stents.  She currently takes low dose aspirin.  She is treated for blood pressure as well as thyroid disease and hyperlipidemia.  Past Medical History: Past Medical History:  Diagnosis Date  . Arthritis 01-17-11   degenerative disc disease-all joints  . Bursitis   . Cholesterol serum elevated 01-17-11   tx. meds  . GERD (gastroesophageal reflux disease) 01-17-11   tx. omeprazole  . Heart murmur    slight murmur  . High cholesterol   . Hypertension 01-17-11   tx. meds  . Hypothyroidism   . Leg pain    ABIs 2/18: normal bilaterally  . Osteoarthritis   . Peripheral neuropathy   . Seasonal allergies   . Sinus problem 01-17-11   tx. Claritin D  . ST elevation (STEMI) myocardial infarction involving left circumflex coronary artery ([place]) 08/21/2015    Surgical History: Past Surgical History:  Procedure Laterality Date  . ABDOMINAL HYSTERECTOMY  01-17-11   '93-Hysterectomy-heavy bleeding  . BACK SURGERY  01-17-11   04-20-10-T-Lift lumbar fusion  . [facility] PROCEDURE  01-17-11   Bladder sling  . CARDIAC CATHETERIZATION N/A 08/21/2015   Procedure: Left Heart Cath and Coronary Angiography;  Surgeon: Sherren Mocha, MD;  Location: [place] [facility];  Service: Cardiovascular;  Laterality: N/A;  . CARDIAC CATHETERIZATION N/A 08/21/2015   Procedure: Coronary Stent Intervention;  Surgeon: Sherren Mocha, MD;  Location: [place] [facility];  Service: Cardiovascular;  Laterality: N/A;  . CARDIAC CATHETERIZATION N/A 08/23/2015   Procedure: Coronary Stent Intervention;  Surgeon: Leonie Man, MD;  Location: [place] [facility];  Service: Cardiovascular;  Laterality: N/A;   promus 2.5x16 in RCA  . CARDIAC CATHETERIZATION N/A 08/23/2015   Procedure: Left Heart Cath and Coronary Angiography;  Surgeon: Leonie Man, MD;  Location: [place] [facility];  Service: Cardiovascular;  Laterality: N/A;  . CARPAL TUNNEL RELEASE  01-17-11   '02-left, also right  . COLONOSCOPY    . HARDWARE REMOVAL Right 05/15/2013   Procedure: REMOVAL RIGHT L4-L5 PEDICLE SCREWS AND ROD ;  Surgeon: Melina Schools, MD;  Location: [place];  Service: Orthopedics;  Laterality: Right;  . JOINT REPLACEMENT  01-17-11    RTKA' 02  . LAPAROSCOPIC APPENDECTOMY N/A 08/13/2013   Procedure: APPENDECTOMY LAPAROSCOPIC;  Surgeon: Pedro Earls, MD;  [facility];  Service: General;  Laterality: N/A;  . TONSILLECTOMY  01-17-11   child  . TOTAL KNEE ARTHROPLASTY  01/19/2011   Procedure: TOTAL KNEE ARTHROPLASTY;  Surgeon: Johnn Hai;  [facility];  Service: Orthopedics;  Laterality: Left;    Family History: Family History  Problem Relation Age of Onset  . Aneurysm Mother   . Heart disease Father   . Heart disease Brother   . Neuropathy Neg Hx     Social History: Social History   Socioeconomic History  . Marital status: Married    Spouse name: Not on file  . Number of children: 3  . Years of education: BSN+  . Highest education level: Not on file  Social Needs  . Financial resource strain: Not on file  . Food insecurity - worry: Not  on file  . Food insecurity - inability: Not on file  . Transportation needs - medical: Not on file  . Transportation needs - non-medical: Not on file  Occupational History  . Occupation: Retired  Tobacco Use  . Smoking status: Former Smoker    Packs/day: 1.00    Years: 19.00    Pack years: 19.00    Types: Cigarettes    Last attempt to quit: 08/14/1983    Years since quitting: 33.5  . Smokeless tobacco: Never Used  Substance and Sexual Activity  . Alcohol use: Yes    Alcohol/week: 1.2 oz    Types: 2 Glasses of wine per week    Comment: per  day  . Drug use: No  . Sexual activity: Yes    Partners: Male    Comment: Married  Other Topics Concern  . Not on file  Social History Narrative   Lives at home w/ her husband and granddaughter   Right-handed   Caffeine: 3 cups of tea daily    Allergies: Penicillins and Pravastatin  Medications: I have reviewed the patient's current medications.  Vital Signs: No data found.  Radiology: No results found.  Labs: No results for input(s): WBC, RBC, HCT, PLT in the last 72 hours. No results for input(s): NA, K, CL, CO2, BUN, CREATININE, GLUCOSE, CALCIUM in the last 72 hours. No results for input(s): LABPT, INR in the last 72 hours.  Review of Systems: ROS  Physical Exam: Alert 3, no shortness of breath lungs are clear to auscultation no chest pain regular rate and rhythm is noted on auscultation.  No bowel or bladder dysfunction lumbar paraspinals tender to palpation no focal neurological deficits no straight leg raise intact peripheral pulses are 1+ compartments are soft and tender negative Babinski's no clonus.  Altered gait plaid pattern noted with increased ambulation patient utilize his cane at times.  Abdomen is soft and nontender.  Assessment and Plan: Risks, benefits of surgery were reviewed with the patient. These include: infection, bleeding, death, stroke, paralysis, ongoing or worse pain, need for additional surgery, injury to the lumbar plexus resulting in hip flexor weakness and difficulty walking without assistive devices. Adjacent segment degenerative disease, need for additional surgery including fusing other levels, leak of spinal fluid, Nonunion, hardware failure, breakage, or mal-position. Deep venous thrombosis (DVT) requiring additional treatment such as filter, and/or medications. Injury to abdominal contents, loss in bowel and bladder control.  Goal of surgery is to reduce (not eliminate) pain, and improve quality of life.  Lumbar spine from November 2018  demonstrate severe segment spinal stenosis at L3-4 slight anterior listhesis minimal anterior listhesis at L5-S1 minimal anterior listhesis at [place], PAC for Melina Schools, MD [place] [phone]  Patient's clinical exam is essentially unchanged.  She continues to have significant back buttock and neuropathic leg pain.  Clinical exam consistent with spinal stenosis with neurogenic claudication.  Despite appropriate conservative care her overall quality of life continues to suffer.  As a result we have elected to move forward with a lateral interbody fusion at L3-4 with supplemental fixation using either a lateral plate or pedicle screws.  I reviewed all of the risks and benefits again with the patient and her husband and all their questions were addressed.

## 2017-03-06 ENCOUNTER — Encounter (HOSPITAL_COMMUNITY)
Admission: RE | Admit: 2017-03-06 | Discharge: 2017-03-06 | Disposition: A | Discharge status: Home / Self Care | Payer: Medicare Other | Source: Ambulatory Visit | Attending: Orthopedic Surgery | Admitting: Orthopedic Surgery

## 2017-03-06 ENCOUNTER — Other Ambulatory Visit (HOSPITAL_COMMUNITY): Payer: Self-pay | Admitting: Physician Assistant

## 2017-03-06 ENCOUNTER — Encounter (HOSPITAL_COMMUNITY): Payer: Self-pay

## 2017-03-06 ENCOUNTER — Other Ambulatory Visit: Payer: Self-pay

## 2017-03-06 HISTORY — DX: Polyneuropathy, unspecified: G62.9

## 2017-03-06 LAB — BASIC METABOLIC PANEL
Anion gap: 14 (ref 5–15)
BUN: 25 mg/dL — ABNORMAL HIGH (ref 6–20)
CO2: 21 mmol/L — ABNORMAL LOW (ref 22–32)
Calcium: 9.5 mg/dL (ref 8.9–10.3)
Chloride: 96 mmol/L — ABNORMAL LOW (ref 101–111)
Creatinine, Ser: 1.13 mg/dL — ABNORMAL HIGH (ref 0.44–1.00)
GFR calc Af Amer: 55 mL/min — ABNORMAL LOW (ref >60)
GFR calc non Af Amer: 48 mL/min — ABNORMAL LOW (ref >60)
Glucose, Bld: 118 mg/dL — ABNORMAL HIGH (ref 65–99)
Potassium: 3.9 mmol/L (ref 3.5–5.1)
Sodium: 131 mmol/L — ABNORMAL LOW (ref 135–145)

## 2017-03-06 LAB — CBC
HCT: 34.6 % — ABNORMAL LOW (ref 36.0–46.0)
Hemoglobin: 10.8 g/dL — ABNORMAL LOW (ref 12.0–15.0)
MCH: 25.2 pg — ABNORMAL LOW (ref 26.0–34.0)
MCHC: 31.2 g/dL (ref 30.0–36.0)
MCV: 80.7 fL (ref 78.0–100.0)
Platelets: 203 10*3/uL (ref 150–400)
RBC: 4.29 MIL/uL (ref 3.87–5.11)
RDW: 16.3 % — ABNORMAL HIGH (ref 11.5–15.5)
WBC: 7.5 10*3/uL (ref 4.0–10.5)

## 2017-03-06 LAB — SURGICAL PCR SCREEN
MRSA, PCR: NEGATIVE
Staphylococcus aureus: POSITIVE — AB

## 2017-03-06 LAB — TYPE AND SCREEN
ABO/RH(D): B POS
Antibody Screen: NEGATIVE

## 2017-03-06 NOTE — Pre-Procedure Instructions (Signed)
TAURIEL SCRONCE  03/06/2017      [id number] [place], [place] - [address] [place] [address] Phone: [phone] Fax: [phone]  [id number] [place], [place] [address] AT [place] [address] Phone: [phone] Fax: [phone]    Your procedure is scheduled on Wednesday 03/07/17  Report to [facility] Admitting at 1000 A.M.  Call this number if you have problems the morning of surgery:  [phone]   Remember:  Do not eat food or drink liquids after midnight.  Take these medicines the morning of surgery with A SIP OF WATER -  GABAPENTIN, LEVOTHYROXINE, METOPROLOL (LOPRESSOR), OMEPRAZOLE (PRILOSEC)  7 days prior to surgery STOP taking any Aspirin(unless otherwise instructed by your surgeon), Aleve, Naproxen, Ibuprofen, Motrin, Advil, Goody's, BC's, all herbal medications, fish oil, and all vitamins, CBD OIL   Do not wear jewelry, make-up or nail polish.  Do not wear lotions, powders, or perfumes, or deodorant.  Do not shave 48 hours prior to surgery.  Men may shave face and neck.  Do not bring valuables to the hospital.  [facility] is not responsible for any belongings or valuables.  Contacts, dentures or bridgework may not be worn into surgery.  Leave your suitcase in the car.  After surgery it may be brought to your room.  For patients admitted to the hospital, discharge time will be determined by your treatment team.  Patients discharged the day of surgery will not be allowed to drive home.   Name and phone number of your driver:    Special instructions:  [facility] - Preparing for Surgery  Before surgery, you can play an important role.  Because skin is not sterile, your skin needs to be as free of germs as possible.  You can reduce the number of germs on you skin by washing with CHG (chlorahexidine gluconate) soap before surgery.  CHG is an antiseptic  cleaner which kills germs and bonds with the skin to continue killing germs even after washing.  Please DO NOT use if you have an allergy to CHG or antibacterial soaps.  If your skin becomes reddened/irritated stop using the CHG and inform your nurse when you arrive at Short Stay.  Do not shave (including legs and underarms) for at least 48 hours prior to the first CHG shower.  You may shave your face.  Please follow these instructions carefully:   1.  Shower with CHG Soap the night before surgery and the                                morning of Surgery.  2.  If you choose to wash your hair, wash your hair first as usual with your       normal shampoo.  3.  After you shampoo, rinse your hair and body thoroughly to remove the                      Shampoo.  4.  Use CHG as you would any other liquid soap.  You can apply chg directly       to the skin and wash gently with scrungie or a clean washcloth.  5.  Apply the CHG Soap to your body ONLY FROM THE NECK DOWN.        Do not use  on open wounds or open sores.  Avoid contact with your eyes,       ears, mouth and genitals (private parts).  Wash genitals (private parts)       with your normal soap.  6.  Wash thoroughly, paying special attention to the area where your surgery        will be performed.  7.  Thoroughly rinse your body with warm water from the neck down.  8.  DO NOT shower/wash with your normal soap after using and rinsing off       the CHG Soap.  9.  Pat yourself dry with a clean towel.            10.  Wear clean pajamas.            11.  Place clean sheets on your bed the night of your first shower and do not        sleep with pets.  Day of Surgery  Do not apply any lotions/deoderants the morning of surgery.  Please wear clean clothes to the hospital/surgery center.    Please read over the following fact sheets that you were given. MRSA Information and Surgical Site Infection Prevention

## 2017-03-06 NOTE — Progress Notes (Signed)
Anesthesia chart review: Patient is a 72 year old female scheduled for XLIF 3-4 on 03/07/17 by Dr. Melina Schools.  History includes former smoker (quit '85), HTN, GERD, hypercholesterolemia, murmur, inferolateral STEMI 08/21/15 (s/p DES OM1 08/21/15 and DES RCA 08/23/15), peripheral neuropathy, hypothyroidism, hysterectomy, tonsillectomy, appendectomy, Gill decompression/discectomy/posterior fusion/posterolateral arthrodesis L4-5 04/20/10, left TKA 01/19/11. BMI is consistent with obesity.  - PCP is listed as Dr. Hulan Fess. - Cardiologist is Dr. Sherren Mocha. Last visit with B. Bhagat, PA-C on 10/10/16. Per telephone encounter 01/11/17 by Christell Faith, PA-C, "Chart reviewed as part of pre-operative protocol coverage. Patient was successfully contacted on 01/10/17 and was noted to be doing well from a cardiac perspective, denying any recent chest pain, palpitations, or dyspnea on exertion. Dr. Burt Knack has indicated the patient can hold aspirin 7 days prior to surgery, if needed. Resume aspirin post-op when safe from a treating physician standpoint. Given past medical history and time since last visit, based on ACC/AHA guidelines, Gabrielle [name] would be at acceptable risk for the planned procedure without further cardiovascular testing."   Meds include ASA 81 mg (on hold), benazepril, fish oil, Neurontin, levothyroxine, Claritin-D, melatonin, Lopressor, Nitro, Prilosec, KCl, Crestor, Maxzide-25, CBD oil (1 gtt daily).  BP (!) 135/57   Pulse 99   Temp 36.6 C   Resp 20   Ht 5' 0.5" (1.537 m)   Wt 182 lb 8 oz (82.8 kg)   SpO2 99%   BMI 35.06 kg/m   EKG 10/10/16: NSR, possible LAE, incomplete right BBB, LVH with repolarization abnormality.  Cardiac cath/PCI 08/21/15 (Dr. Sherren Mocha):   Mid RCA lesion, 85% stenosed.  Ost LM lesion, 40% stenosed.  Prox LAD to Mid LAD lesion, 25% stenosed.  Ost Cx to Prox Cx lesion, 70% stenosed.  There is mild left ventricular systolic dysfunction.  1st Mrg  lesion, 100% stenosed. Post intervention, there is a 0% residual stenosis.  1. Acute inferolateral STEMI secondary to total occlusion of the first OM branch. Is is treated successfully with primary PCI (2.5 x 16 mm Promus DES). The proximal circumflex has moderate diffuse disease all the way back to the ostium. This would require a long overlapping stent in a diffusely calcified vessel. I felt it was best to treat this area medically. 2. Mild nonobstructive disease of the left main and LAD 3. Severe stenosis of the mid RCA: Recommend staged PCI prior to discharge 4. Mild segmental contraction abnormality the left ventricle with preserved overall LVEF Recommendations: Aspirin and brilinta 12 months, aggressive medical therapy, staged PCI the right coronary artery on Monday.  PCI 08/23/15 (Dr. Glenetta Hew):   Mid RCA lesion, 85 %stenosed.  A STENT PROMUS PREM MR 2.5X16 drug eluting stent was successfully placed. Post intervention, there is a 0% residual stenosis.  1st Mrg stent appears to be widely patent  Ost Cx to Prox Cx lesion, 70 %stenosed. Successful focal PCI of mid RCA lesion with DES stent. Reviewed with Dr. Burt Knack, we both feel that the osteo-proximal circumflex lesion is a very difficult lesion to consider PCI as there is really minimal landing zone for stent.  This also somewhat calcified and likely chronic. The patient had not been having anginal symptoms prior to coming in, therefore it is unlikely to be flow-limiting. Plan for now is medical management.  Echo 08/23/15: Study Conclusions - Left ventricle: The cavity size was normal. Systolic function was   normal. The estimated ejection fraction was in the range of 55%   to 60%. Wall  motion was normal; there were no regional wall   motion abnormalities. Left ventricular diastolic function   parameters were normal. - Atrial septum: No defect or patent foramen ovale was identified.  Preoperative labs noted. H/H 10.8/34.6  (11.6/35.9 08/24/15). PLT 203. Cr 1.13. Glucose 118.   If no acute changes then I anticipate that she can proceed as planned.  George Hugh [facility] Short Stay Center/Anesthesiology Phone [phone] 03/06/2017 6:38 PM

## 2017-03-07 ENCOUNTER — Inpatient Hospital Stay (HOSPITAL_COMMUNITY): Payer: Medicare Other | Admitting: Anesthesiology

## 2017-03-07 ENCOUNTER — Inpatient Hospital Stay (HOSPITAL_COMMUNITY): Payer: Medicare Other | Admitting: Vascular Surgery

## 2017-03-07 ENCOUNTER — Other Ambulatory Visit: Payer: Self-pay

## 2017-03-07 ENCOUNTER — Encounter (HOSPITAL_COMMUNITY)
Admission: RE | Disposition: A | Discharge status: Home / Self Care | Payer: Self-pay | Source: Ambulatory Visit | Attending: Orthopedic Surgery

## 2017-03-07 ENCOUNTER — Inpatient Hospital Stay (HOSPITAL_COMMUNITY): Payer: Medicare Other

## 2017-03-07 ENCOUNTER — Inpatient Hospital Stay (HOSPITAL_COMMUNITY)
Admission: RE | Admit: 2017-03-07 | Discharge: 2017-03-08 | DRG: 460 | Disposition: A | Discharge status: Home / Self Care | Payer: Medicare Other | Source: Ambulatory Visit | Attending: Orthopedic Surgery | Admitting: Orthopedic Surgery

## 2017-03-07 ENCOUNTER — Encounter (HOSPITAL_COMMUNITY): Payer: Self-pay | Admitting: *Deleted

## 2017-03-07 DIAGNOSIS — M48062 Spinal stenosis, lumbar region with neurogenic claudication: Secondary | ICD-10-CM | POA: Diagnosis not present

## 2017-03-07 DIAGNOSIS — M5136 Other intervertebral disc degeneration, lumbar region: Principal | ICD-10-CM | POA: Diagnosis present

## 2017-03-07 DIAGNOSIS — K219 Gastro-esophageal reflux disease without esophagitis: Secondary | ICD-10-CM | POA: Diagnosis not present

## 2017-03-07 DIAGNOSIS — M48061 Spinal stenosis, lumbar region without neurogenic claudication: Secondary | ICD-10-CM | POA: Diagnosis not present

## 2017-03-07 DIAGNOSIS — Z90710 Acquired absence of both cervix and uterus: Secondary | ICD-10-CM

## 2017-03-07 DIAGNOSIS — G629 Polyneuropathy, unspecified: Secondary | ICD-10-CM | POA: Diagnosis present

## 2017-03-07 DIAGNOSIS — I10 Essential (primary) hypertension: Secondary | ICD-10-CM | POA: Diagnosis present

## 2017-03-07 DIAGNOSIS — Z981 Arthrodesis status: Secondary | ICD-10-CM | POA: Diagnosis not present

## 2017-03-07 DIAGNOSIS — E039 Hypothyroidism, unspecified: Secondary | ICD-10-CM | POA: Diagnosis present

## 2017-03-07 DIAGNOSIS — M545 Low back pain: Secondary | ICD-10-CM | POA: Diagnosis not present

## 2017-03-07 DIAGNOSIS — E7800 Pure hypercholesterolemia, unspecified: Secondary | ICD-10-CM | POA: Diagnosis not present

## 2017-03-07 DIAGNOSIS — I252 Old myocardial infarction: Secondary | ICD-10-CM | POA: Diagnosis not present

## 2017-03-07 DIAGNOSIS — Z87891 Personal history of nicotine dependence: Secondary | ICD-10-CM

## 2017-03-07 DIAGNOSIS — E785 Hyperlipidemia, unspecified: Secondary | ICD-10-CM | POA: Diagnosis not present

## 2017-03-07 DIAGNOSIS — Z96653 Presence of artificial knee joint, bilateral: Secondary | ICD-10-CM | POA: Diagnosis not present

## 2017-03-07 DIAGNOSIS — Z419 Encounter for procedure for purposes other than remedying health state, unspecified: Secondary | ICD-10-CM

## 2017-03-07 DIAGNOSIS — I2510 Atherosclerotic heart disease of native coronary artery without angina pectoris: Secondary | ICD-10-CM | POA: Diagnosis not present

## 2017-03-07 HISTORY — PX: ANTERIOR LATERAL LUMBAR FUSION WITH PERCUTANEOUS SCREW 1 LEVEL: SHX5553

## 2017-03-07 SURGERY — ANTERIOR LATERAL LUMBAR FUSION WITH PERCUTANEOUS SCREW 1 LEVEL
Anesthesia: General | Site: Spine Lumbar

## 2017-03-07 MED ORDER — LACTATED RINGERS IV SOLN
INTRAVENOUS | Status: DC
  Administered 2017-03-07 (×3): via INTRAVENOUS

## 2017-03-07 MED ORDER — HYDROMORPHONE HCL 1 MG/ML IJ SOLN
INTRAMUSCULAR | Status: AC
  Administered 2017-03-07: 0.5 mg via INTRAVENOUS
  Filled 2017-03-07: qty 1

## 2017-03-07 MED ORDER — PHENOL 1.4 % MT LIQD: 1.0000 | OROMUCOSAL | Status: DC | PRN

## 2017-03-07 MED ORDER — OXYCODONE HCL 5 MG PO TABS
10.0000 mg | ORAL_TABLET | ORAL | Status: DC | PRN
  Administered 2017-03-07 – 2017-03-08 (×7): 10 mg via ORAL
  Filled 2017-03-07 (×6): qty 2

## 2017-03-07 MED ORDER — VANCOMYCIN HCL IN DEXTROSE 1-5 GM/200ML-% IV SOLN
1000.0000 mg | Freq: Once | INTRAVENOUS | Status: AC
  Administered 2017-03-08: 1000 mg via INTRAVENOUS
  Filled 2017-03-07: qty 200

## 2017-03-07 MED ORDER — MAGNESIUM CITRATE PO SOLN
1.0000 | Freq: Once | ORAL | Status: AC | PRN
  Administered 2017-03-08: 1 via ORAL
  Filled 2017-03-07: qty 296

## 2017-03-07 MED ORDER — PROPOFOL 10 MG/ML IV BOLUS
INTRAVENOUS | Status: DC | PRN
  Administered 2017-03-07: 100 mg via INTRAVENOUS
  Administered 2017-03-07: 50 mg via INTRAVENOUS
  Administered 2017-03-07: 150 mg via INTRAVENOUS

## 2017-03-07 MED ORDER — HEMOSTATIC AGENTS (NO CHARGE) OPTIME
TOPICAL | Status: DC | PRN
  Administered 2017-03-07 (×2): 1

## 2017-03-07 MED ORDER — SODIUM CHLORIDE 0.9% FLUSH: 3.0000 mL | INTRAVENOUS | Status: DC | PRN

## 2017-03-07 MED ORDER — ACETAMINOPHEN 325 MG PO TABS
650.0000 mg | ORAL_TABLET | ORAL | Status: DC | PRN
  Administered 2017-03-08: 650 mg via ORAL
  Filled 2017-03-07: qty 2

## 2017-03-07 MED ORDER — ACETAMINOPHEN 10 MG/ML IV SOLN
INTRAVENOUS | Status: DC | PRN
  Administered 2017-03-07: 1000 mg via INTRAVENOUS

## 2017-03-07 MED ORDER — FENTANYL CITRATE (PF) 100 MCG/2ML IJ SOLN
INTRAMUSCULAR | Status: DC | PRN
  Administered 2017-03-07 (×4): 50 ug via INTRAVENOUS
  Administered 2017-03-07: 100 ug via INTRAVENOUS
  Administered 2017-03-07 (×3): 50 ug via INTRAVENOUS

## 2017-03-07 MED ORDER — BUPIVACAINE-EPINEPHRINE (PF) 0.5% -1:200000 IJ SOLN
INTRAMUSCULAR | Status: AC
  Filled 2017-03-07: qty 30

## 2017-03-07 MED ORDER — OXYCODONE-ACETAMINOPHEN 10-325 MG PO TABS: 1.0000 | ORAL_TABLET | ORAL | 0 refills | Status: AC | PRN

## 2017-03-07 MED ORDER — PHENYLEPHRINE HCL 10 MG/ML IJ SOLN
INTRAMUSCULAR | Status: DC | PRN
  Administered 2017-03-07 (×2): 80 ug via INTRAVENOUS

## 2017-03-07 MED ORDER — LIDOCAINE HCL (CARDIAC) 20 MG/ML IV SOLN
INTRAVENOUS | Status: DC | PRN
  Administered 2017-03-07: 100 mg via INTRAVENOUS

## 2017-03-07 MED ORDER — MENTHOL 3 MG MT LOZG: 1.0000 | LOZENGE | OROMUCOSAL | Status: DC | PRN

## 2017-03-07 MED ORDER — HYDROMORPHONE HCL 1 MG/ML IJ SOLN
0.2500 mg | INTRAMUSCULAR | Status: DC | PRN
  Administered 2017-03-07 (×4): 0.5 mg via INTRAVENOUS

## 2017-03-07 MED ORDER — OXYCODONE HCL 5 MG PO TABS: 5.0000 mg | ORAL_TABLET | ORAL | Status: DC | PRN

## 2017-03-07 MED ORDER — TRIAMTERENE-HCTZ 37.5-25 MG PO TABS
1.0000 | ORAL_TABLET | Freq: Every day | ORAL | Status: DC
  Administered 2017-03-08: 1 via ORAL
  Filled 2017-03-07 (×2): qty 1

## 2017-03-07 MED ORDER — NITROGLYCERIN 0.4 MG SL SUBL: 0.4000 mg | SUBLINGUAL_TABLET | SUBLINGUAL | Status: DC | PRN

## 2017-03-07 MED ORDER — ONDANSETRON 4 MG PO TBDP: 4.0000 mg | ORAL_TABLET | Freq: Three times a day (TID) | ORAL | 0 refills | Status: DC | PRN

## 2017-03-07 MED ORDER — METOPROLOL TARTRATE 12.5 MG HALF TABLET
12.5000 mg | ORAL_TABLET | Freq: Two times a day (BID) | ORAL | Status: DC
  Administered 2017-03-07 – 2017-03-08 (×2): 12.5 mg via ORAL
  Filled 2017-03-07 (×2): qty 1

## 2017-03-07 MED ORDER — PANTOPRAZOLE SODIUM 40 MG PO TBEC
40.0000 mg | DELAYED_RELEASE_TABLET | Freq: Every day | ORAL | Status: DC
  Administered 2017-03-08: 40 mg via ORAL
  Filled 2017-03-07: qty 1

## 2017-03-07 MED ORDER — LEVOTHYROXINE SODIUM 75 MCG PO TABS
75.0000 ug | ORAL_TABLET | Freq: Every day | ORAL | Status: DC
  Administered 2017-03-08: 75 ug via ORAL
  Filled 2017-03-07: qty 1

## 2017-03-07 MED ORDER — THROMBIN (RECOMBINANT) 20000 UNITS EX SOLR
CUTANEOUS | Status: AC
  Filled 2017-03-07: qty 20000

## 2017-03-07 MED ORDER — BUPIVACAINE-EPINEPHRINE 0.25% -1:200000 IJ SOLN
INTRAMUSCULAR | Status: AC
  Filled 2017-03-07: qty 1

## 2017-03-07 MED ORDER — VANCOMYCIN HCL IN DEXTROSE 1-5 GM/200ML-% IV SOLN
1000.0000 mg | INTRAVENOUS | Status: AC
  Administered 2017-03-07: 1000 mg via INTRAVENOUS
  Filled 2017-03-07: qty 200

## 2017-03-07 MED ORDER — BENAZEPRIL HCL 20 MG PO TABS
20.0000 mg | ORAL_TABLET | Freq: Two times a day (BID) | ORAL | Status: DC
  Administered 2017-03-07 – 2017-03-08 (×2): 20 mg via ORAL
  Filled 2017-03-07 (×4): qty 1

## 2017-03-07 MED ORDER — METHOCARBAMOL 500 MG PO TABS: 500.0000 mg | ORAL_TABLET | Freq: Three times a day (TID) | ORAL | 0 refills | Status: DC

## 2017-03-07 MED ORDER — MORPHINE SULFATE (PF) 4 MG/ML IV SOLN: 1.0000 mg | INTRAVENOUS | Status: DC | PRN

## 2017-03-07 MED ORDER — FENTANYL CITRATE (PF) 250 MCG/5ML IJ SOLN
INTRAMUSCULAR | Status: AC
  Filled 2017-03-07: qty 5

## 2017-03-07 MED ORDER — BUPIVACAINE-EPINEPHRINE 0.5% -1:200000 IJ SOLN
INTRAMUSCULAR | Status: DC | PRN
  Administered 2017-03-07: 10 mL

## 2017-03-07 MED ORDER — LACTATED RINGERS IV SOLN: INTRAVENOUS | Status: DC

## 2017-03-07 MED ORDER — PHENYLEPHRINE HCL 10 MG/ML IJ SOLN
INTRAMUSCULAR | Status: DC | PRN
  Administered 2017-03-07: 50 ug/min via INTRAVENOUS

## 2017-03-07 MED ORDER — MIDAZOLAM HCL 2 MG/2ML IJ SOLN
INTRAMUSCULAR | Status: DC | PRN
  Administered 2017-03-07: 2 mg via INTRAVENOUS

## 2017-03-07 MED ORDER — ACETAMINOPHEN 650 MG RE SUPP: 650.0000 mg | RECTAL | Status: DC | PRN

## 2017-03-07 MED ORDER — THROMBIN (RECOMBINANT) 20000 UNITS EX SOLR
CUTANEOUS | Status: DC | PRN
  Administered 2017-03-07: 20000 [IU] via TOPICAL

## 2017-03-07 MED ORDER — BUPIVACAINE-EPINEPHRINE 0.25% -1:200000 IJ SOLN
INTRAMUSCULAR | Status: DC | PRN
  Administered 2017-03-07: 10 mL

## 2017-03-07 MED ORDER — ONDANSETRON HCL 4 MG/2ML IJ SOLN
INTRAMUSCULAR | Status: DC | PRN
  Administered 2017-03-07: 4 mg via INTRAVENOUS

## 2017-03-07 MED ORDER — SUCCINYLCHOLINE CHLORIDE 20 MG/ML IJ SOLN
INTRAMUSCULAR | Status: DC | PRN
  Administered 2017-03-07: 120 mg via INTRAVENOUS

## 2017-03-07 MED ORDER — METHOCARBAMOL 500 MG PO TABS
ORAL_TABLET | ORAL | Status: AC
  Filled 2017-03-07: qty 1

## 2017-03-07 MED ORDER — ACETAMINOPHEN 10 MG/ML IV SOLN
INTRAVENOUS | Status: AC
  Filled 2017-03-07: qty 100

## 2017-03-07 MED ORDER — SODIUM CHLORIDE 0.9 % IV SOLN: 250.0000 mL | INTRAVENOUS | Status: DC

## 2017-03-07 MED ORDER — FENTANYL CITRATE (PF) 100 MCG/2ML IJ SOLN: 25.0000 ug | INTRAMUSCULAR | Status: DC | PRN

## 2017-03-07 MED ORDER — PROPOFOL 10 MG/ML IV BOLUS
INTRAVENOUS | Status: AC
  Filled 2017-03-07: qty 20

## 2017-03-07 MED ORDER — ONDANSETRON HCL 4 MG/2ML IJ SOLN: 4.0000 mg | Freq: Four times a day (QID) | INTRAMUSCULAR | Status: DC | PRN

## 2017-03-07 MED ORDER — METHOCARBAMOL 1000 MG/10ML IJ SOLN: 500.0000 mg | Freq: Four times a day (QID) | INTRAVENOUS | Status: DC | PRN

## 2017-03-07 MED ORDER — POLYETHYLENE GLYCOL 3350 17 G PO PACK: 17.0000 g | PACK | Freq: Every day | ORAL | Status: DC | PRN

## 2017-03-07 MED ORDER — METHOCARBAMOL 500 MG PO TABS
500.0000 mg | ORAL_TABLET | Freq: Four times a day (QID) | ORAL | Status: DC | PRN
  Administered 2017-03-07 – 2017-03-08 (×2): 500 mg via ORAL
  Filled 2017-03-07: qty 1

## 2017-03-07 MED ORDER — DOCUSATE SODIUM 100 MG PO CAPS
100.0000 mg | ORAL_CAPSULE | Freq: Two times a day (BID) | ORAL | Status: DC
  Administered 2017-03-07 – 2017-03-08 (×2): 100 mg via ORAL
  Filled 2017-03-07 (×2): qty 1

## 2017-03-07 MED ORDER — OXYCODONE HCL 5 MG PO TABS
ORAL_TABLET | ORAL | Status: AC
  Filled 2017-03-07: qty 2

## 2017-03-07 MED ORDER — OXYCODONE HCL 5 MG PO TABS: 5.0000 mg | ORAL_TABLET | Freq: Once | ORAL | Status: DC | PRN

## 2017-03-07 MED ORDER — ROSUVASTATIN CALCIUM 20 MG PO TABS
40.0000 mg | ORAL_TABLET | Freq: Every day | ORAL | Status: DC
  Administered 2017-03-07: 40 mg via ORAL
  Filled 2017-03-07: qty 2

## 2017-03-07 MED ORDER — DEXAMETHASONE SODIUM PHOSPHATE 10 MG/ML IJ SOLN
INTRAMUSCULAR | Status: DC | PRN
  Administered 2017-03-07: 10 mg via INTRAVENOUS

## 2017-03-07 MED ORDER — 0.9 % SODIUM CHLORIDE (POUR BTL) OPTIME
TOPICAL | Status: DC | PRN
  Administered 2017-03-07 (×3): 1000 mL

## 2017-03-07 MED ORDER — GABAPENTIN 600 MG PO TABS
600.0000 mg | ORAL_TABLET | Freq: Two times a day (BID) | ORAL | Status: DC
  Administered 2017-03-07 – 2017-03-08 (×2): 600 mg via ORAL
  Filled 2017-03-07 (×2): qty 1

## 2017-03-07 MED ORDER — ONDANSETRON HCL 4 MG PO TABS: 4.0000 mg | ORAL_TABLET | Freq: Four times a day (QID) | ORAL | Status: DC | PRN

## 2017-03-07 MED ORDER — MIDAZOLAM HCL 2 MG/2ML IJ SOLN
INTRAMUSCULAR | Status: AC
  Filled 2017-03-07: qty 2

## 2017-03-07 MED ORDER — OXYCODONE HCL 5 MG/5ML PO SOLN: 5.0000 mg | Freq: Once | ORAL | Status: DC | PRN

## 2017-03-07 MED ORDER — SODIUM CHLORIDE 0.9% FLUSH: 3.0000 mL | Freq: Two times a day (BID) | INTRAVENOUS | Status: DC

## 2017-03-07 SURGICAL SUPPLY — 99 items
APPLIER CLIP 11 MED OPEN (CLIP)
BLADE CLIPPER SURG (BLADE) IMPLANT
BLADE SURG 10 STRL SS (BLADE) ×3 IMPLANT
BONE MATRIX OSTEOCEL PRO MED (Bone Implant) ×3 IMPLANT
BONE VIVIGEN FORMABLE 5.4CC (Bone Implant) ×3 IMPLANT
CAGE MODULUS XL 12X18X50 - 10 (Cage) ×3 IMPLANT
CLIP APPLIE 11 MED OPEN (CLIP) IMPLANT
CLIP NEUROVISION LG (CLIP) ×3 IMPLANT
CLOSURE STERI-STRIP 1/2X4 (GAUZE/BANDAGES/DRESSINGS) ×2
CLOSURE WOUND 1/2 X4 (GAUZE/BANDAGES/DRESSINGS)
CLSR STERI-STRIP ANTIMIC 1/2X4 (GAUZE/BANDAGES/DRESSINGS) ×4 IMPLANT
CORD BIPOLAR FORCEPS 12FT (ELECTRODE) ×3 IMPLANT
CORDS BIPOLAR (ELECTRODE) ×3 IMPLANT
COVER SURGICAL LIGHT HANDLE (MISCELLANEOUS) ×6 IMPLANT
DECANTER SPIKE VIAL GLASS SM (MISCELLANEOUS) ×3 IMPLANT
DERMABOND ADVANCED (GAUZE/BANDAGES/DRESSINGS) ×2
DERMABOND ADVANCED .7 DNX12 (GAUZE/BANDAGES/DRESSINGS) ×1 IMPLANT
DRAPE C-ARM 42X72 X-RAY (DRAPES) ×3 IMPLANT
DRAPE INCISE IOBAN 66X45 STRL (DRAPES) ×3 IMPLANT
DRAPE LAPAROTOMY T 102X78X121 (DRAPES) ×3 IMPLANT
DRAPE ORTHO SPLIT 77X108 STRL (DRAPES) ×4
DRAPE POUCH INSTRU U-SHP 10X18 (DRAPES) ×6 IMPLANT
DRAPE SURG 17X11 SM STRL (DRAPES) ×3 IMPLANT
DRAPE SURG ORHT 6 SPLT 77X108 (DRAPES) ×2 IMPLANT
DRAPE U-SHAPE 47X51 STRL (DRAPES) ×9 IMPLANT
DRSG AQUACEL AG ADV 3.5X 6 (GAUZE/BANDAGES/DRESSINGS) ×3 IMPLANT
DRSG OPSITE POSTOP 3X4 (GAUZE/BANDAGES/DRESSINGS) ×3 IMPLANT
DRSG OPSITE POSTOP 4X6 (GAUZE/BANDAGES/DRESSINGS) ×6 IMPLANT
DURAPREP 26ML APPLICATOR (WOUND CARE) ×3 IMPLANT
ELECT BLADE 4.0 EZ CLEAN MEGAD (MISCELLANEOUS) ×3
ELECT CAUTERY BLADE 6.4 (BLADE) ×3 IMPLANT
ELECT PENCIL ROCKER SW 15FT (MISCELLANEOUS) ×6 IMPLANT
ELECT REM PT RETURN 9FT ADLT (ELECTROSURGICAL) ×6
ELECTRODE BLDE 4.0 EZ CLN MEGD (MISCELLANEOUS) ×1 IMPLANT
ELECTRODE REM PT RTRN 9FT ADLT (ELECTROSURGICAL) ×2 IMPLANT
FLOSEAL 10ML (HEMOSTASIS) ×3 IMPLANT
GAUZE SPONGE 4X4 16PLY XRAY LF (GAUZE/BANDAGES/DRESSINGS) ×3 IMPLANT
GLOVE BIO SURGEON STRL SZ 6.5 (GLOVE) ×6 IMPLANT
GLOVE BIO SURGEONS STRL SZ 6.5 (GLOVE) ×3
GLOVE BIOGEL PI IND STRL 6.5 (GLOVE) ×1 IMPLANT
GLOVE BIOGEL PI IND STRL 8.5 (GLOVE) ×2 IMPLANT
GLOVE BIOGEL PI INDICATOR 6.5 (GLOVE) ×2
GLOVE BIOGEL PI INDICATOR 8.5 (GLOVE) ×4
GLOVE SS BIOGEL STRL SZ 8.5 (GLOVE) ×2 IMPLANT
GLOVE SUPERSENSE BIOGEL SZ 8.5 (GLOVE) ×4
GLOVE SURG SS PI 7.0 STRL IVOR (GLOVE) ×3 IMPLANT
GOWN L4 XXLG W/PAP TWL (GOWN DISPOSABLE) ×3 IMPLANT
GOWN STRL REUS W/ TWL LRG LVL3 (GOWN DISPOSABLE) ×2 IMPLANT
GOWN STRL REUS W/TWL 2XL LVL3 (GOWN DISPOSABLE) ×6 IMPLANT
GOWN STRL REUS W/TWL LRG LVL3 (GOWN DISPOSABLE) ×4
GUIDEWIRE NITINOL BEVEL TIP (WIRE) ×6 IMPLANT
KIT BASIN OR (CUSTOM PROCEDURE TRAY) ×3 IMPLANT
KIT DILATOR XLIF 5 (KITS) ×2 IMPLANT
KIT ROOM TURNOVER OR (KITS) ×3 IMPLANT
KIT SURGICAL ACCESS MAXCESS 4 (KITS) ×3 IMPLANT
KIT XLIF (KITS) ×1
MODULE NVM5 NEXT GEN EMG (NEEDLE) ×3 IMPLANT
NEEDLE 22X1 1/2 (OR ONLY) (NEEDLE) ×6 IMPLANT
NEEDLE I-PASS III (NEEDLE) IMPLANT
NEEDLE SPNL 18GX3.5 QUINCKE PK (NEEDLE) ×3 IMPLANT
NS IRRIG 1000ML POUR BTL (IV SOLUTION) ×6 IMPLANT
PACK LAMINECTOMY ORTHO (CUSTOM PROCEDURE TRAY) ×3 IMPLANT
PACK UNIVERSAL I (CUSTOM PROCEDURE TRAY) ×6 IMPLANT
PAD ARMBOARD 7.5X6 YLW CONV (MISCELLANEOUS) ×6 IMPLANT
PIN FIXATION XLIF DECADE (PIN) ×3 IMPLANT
PROBE BALL TIP NVM5 SNG USE (BALLOONS) ×3 IMPLANT
ROD RELINE MAS TI LORD 5.5X40 (Rod) ×3 IMPLANT
SCREW LOCK RELINE 5.5 TULIP (Screw) ×6 IMPLANT
SCREW RELINE MAS POLY 6.5X40MM (Screw) ×3 IMPLANT
SPONGE INTESTINAL PEANUT (DISPOSABLE) ×6 IMPLANT
SPONGE LAP 4X18 X RAY DECT (DISPOSABLE) ×9 IMPLANT
SPONGE SURGIFOAM ABS GEL 100 (HEMOSTASIS) ×3 IMPLANT
STAPLER VISISTAT 35W (STAPLE) ×3 IMPLANT
STRIP CLOSURE SKIN 1/2X4 (GAUZE/BANDAGES/DRESSINGS) IMPLANT
SURGIFLO W/THROMBIN 8M KIT (HEMOSTASIS) IMPLANT
SUT BONE WAX W31G (SUTURE) ×3 IMPLANT
SUT MON AB 3-0 SH 27 (SUTURE) ×6
SUT MON AB 3-0 SH27 (SUTURE) ×3 IMPLANT
SUT PROLENE 5 0 C 1 24 (SUTURE) IMPLANT
SUT SILK 2 0 TIES 10X30 (SUTURE) ×3 IMPLANT
SUT SILK 3 0 TIES 10X30 (SUTURE) ×3 IMPLANT
SUT VIC AB 1 CT1 18XCR BRD 8 (SUTURE) ×2 IMPLANT
SUT VIC AB 1 CT1 27 (SUTURE) ×4
SUT VIC AB 1 CT1 27XBRD ANBCTR (SUTURE) ×2 IMPLANT
SUT VIC AB 1 CT1 8-18 (SUTURE) ×4
SUT VIC AB 1 CTX 36 (SUTURE) ×4
SUT VIC AB 1 CTX36XBRD ANBCTR (SUTURE) ×2 IMPLANT
SUT VIC AB 2-0 CT1 18 (SUTURE) ×6 IMPLANT
SYR BULB IRRIGATION 50ML (SYRINGE) ×3 IMPLANT
SYR CONTROL 10ML LL (SYRINGE) ×6 IMPLANT
TAPE CLOTH 4X10 WHT NS (GAUZE/BANDAGES/DRESSINGS) ×6 IMPLANT
TOWEL GREEN STERILE FF (TOWEL DISPOSABLE) ×3 IMPLANT
TOWEL OR 17X24 6PK STRL BLUE (TOWEL DISPOSABLE) IMPLANT
TOWEL OR 17X26 10 PK STRL BLUE (TOWEL DISPOSABLE) ×3 IMPLANT
TRAY FOLEY CATH SILVER 16FR (SET/KITS/TRAYS/PACK) ×3 IMPLANT
TUBE CONNECTING 12'X1/4 (SUCTIONS) ×1
TUBE CONNECTING 12X1/4 (SUCTIONS) ×2 IMPLANT
WATER STERILE IRR 1000ML POUR (IV SOLUTION) IMPLANT
YANKAUER SUCT BULB TIP NO VENT (SUCTIONS) ×3 IMPLANT

## 2017-03-07 NOTE — Transfer of Care (Signed)
Immediate Anesthesia Transfer of Care Note  Patient: GINEVRA TACKER  Procedure(s) Performed: XLIF L3-4 (N/A Spine Lumbar)  Patient Location: PACU  Anesthesia Type:General  Level of Consciousness: awake and alert   Airway & Oxygen Therapy: Patient Spontanous Breathing and Patient connected to nasal cannula oxygen  Post-op Assessment: Report given to RN and Post -op Vital signs reviewed and stable  Post vital signs: Reviewed and stable  Last Vitals:  Vitals:   03/07/17 1026 03/07/17 1757  BP: (!) 152/62   Pulse: 74   Resp: 18   Temp: 36.5 C (!) 36.2 C  SpO2: 100%     Last Pain:  Vitals:   03/07/17 1042  TempSrc:   PainSc: 4          Complications: No apparent anesthesia complications

## 2017-03-07 NOTE — Progress Notes (Signed)
Patient spoke with Dr. Rolena Infante and requested thigh ted hose.  Notified or nurse that thigh ted hose were placed on patient.

## 2017-03-07 NOTE — Progress Notes (Signed)
Pharmacy Antibiotic Note  Gabrielle [name] is a 72 y.o. female admitted on 03/07/2017 with surgical prophylaxis.  Pharmacy has been consulted for vancomycin dosing.  Will only need 1 additional dose since no drains in place.  Had vancomycin 1 g earlier today at 1406 PM.  Plan: 1. Vancomycin 1g x 1 dose tomorrow AM at 0200. 2. Pharmacy will sign-off, please contact if questions.  Height: 5' 0.5" (153.7 cm) Weight: 182 lb 8 oz (82.8 kg) IBW/kg (Calculated) : 46.65  Temp (24hrs), Avg:97.2 F (36.2 C), Min:96.8 F (36 C), Max:97.7 F (36.5 C)  Recent Labs  Lab 03/06/17 1545  WBC 7.5  CREATININE 1.13*    Estimated Creatinine Clearance: 44 mL/min (A) (by C-G formula based on SCr of 1.13 mg/dL (H)).    Allergies  Allergen Reactions  . Penicillins Other (See Comments)    Allergy as an infant - no other information available Has patient had a PCN reaction causing immediate rash, facial/tongue/throat swelling, SOB or lightheadedness with hypotension: Unknown Has patient had a PCN reaction causing severe rash involving mucus membranes or skin necrosis: Unknown Has patient had a PCN reaction that required hospitalization: No Has patient had a PCN reaction occurring within the last 10 years: No If all of the above answers are "NO", then may proceed with Cephalospor  . Pravastatin Other (See Comments)    Effects liver function      Thank you for allowing pharmacy to be a part of this patient's care.  Uvaldo Rising, BCPS  Clinical Pharmacist Pager [phone]  03/07/2017 8:01 PM

## 2017-03-07 NOTE — Anesthesia Procedure Notes (Signed)
Procedure Name: Intubation Date/Time: 03/07/2017 2:10 PM Performed by: Scheryl Darter, CRNA Pre-anesthesia Checklist: Patient identified, Emergency Drugs available, Suction available and Patient being monitored Patient Re-evaluated:Patient Re-evaluated prior to induction Oxygen Delivery Method: Circle System Utilized Preoxygenation: Pre-oxygenation with 100% oxygen Induction Type: IV induction Ventilation: Mask ventilation without difficulty Laryngoscope Size: Miller and 2 Grade View: Grade I Tube type: Oral Tube size: 7.5 mm Number of attempts: 1 Airway Equipment and Method: Stylet and Oral airway Placement Confirmation: ETT inserted through vocal cords under direct vision,  positive ETCO2 and breath sounds checked- equal and bilateral Secured at: 21 cm Tube secured with: Tape Dental Injury: Teeth and Oropharynx as per pre-operative assessment

## 2017-03-07 NOTE — Brief Op Note (Signed)
03/07/2017  5:52 PM  PATIENT:  Gabrielle [name]  72 y.o. female  PRE-OPERATIVE DIAGNOSIS:  Spinal Stenosis L3-4  POST-OPERATIVE DIAGNOSIS:  Spinal Stenosis L3-4  PROCEDURE:  Procedure(s) with comments: XLIF L3-4 (N/A) - 4 hrs  SURGEON:  Surgeon(s) and Role:    Melina Schools, MD - Primary  PHYSICIAN ASSISTANT:   ASSISTANTS: Carmen Mayo   ANESTHESIA:   general  EBL:  100 mL   BLOOD ADMINISTERED:none  DRAINS: none   LOCAL MEDICATIONS USED:  MARCAINE     SPECIMEN:  No Specimen  DISPOSITION OF SPECIMEN:  N/A  COUNTS:  YES  TOURNIQUET:  * No tourniquets in log *  DICTATION: .Dragon Dictation  PLAN OF CARE: Admit to inpatient   PATIENT DISPOSITION:  PACU - hemodynamically stable.

## 2017-03-07 NOTE — Anesthesia Postprocedure Evaluation (Signed)
Anesthesia Post Note  Patient: Gabrielle [name]  Procedure(s) Performed: XLIF L3-4 (N/A Spine Lumbar)     Patient location during evaluation: PACU Anesthesia Type: General Level of consciousness: sedated Pain management: pain level controlled Vital Signs Assessment: post-procedure vital signs reviewed and stable Respiratory status: spontaneous breathing and respiratory function stable Cardiovascular status: stable Postop Assessment: no apparent nausea or vomiting Anesthetic complications: no    Last Vitals:  Vitals:   03/07/17 1930 03/07/17 1945  BP:  (!) 148/81  Pulse: 87 90  Resp: 13 18  Temp:  36.8 C  SpO2: 100% 94%    Last Pain:  Vitals:   03/07/17 1945  TempSrc: Oral  PainSc:                  Kenneisha Cochrane DANIEL

## 2017-03-07 NOTE — Discharge Instructions (Signed)
Spinal Fusion, Care After °These instructions give you information about caring for yourself after your procedure. Your doctor may also give you more specific instructions. Call your doctor if you have any problems or questions after your procedure. °Follow these instructions at home: °Medicines °· Take over-the-counter and prescription medicines only as told by your doctor. These include any medicines for pain. °· Do not drive for 24 hours if you received a sedative. °· Do not drive or use heavy machinery while taking prescription pain medicine. °· If you were prescribed an antibiotic medicine, take it as told by your doctor. Do not stop taking the antibiotic even if you start to feel better. °Surgical Cut (Incision) Care °· Follow instructions from your doctor about how to take care of your surgical cut. Make sure you: °? Wash your hands with soap and water before you change your bandage (dressing). If you cannot use soap and water, use hand sanitizer. °? Change your bandage as told by your doctor. °? Leave stitches (sutures), skin glue, or skin tape (adhesive) strips in place. They may need to stay in place for 2 weeks or longer. If tape strips get loose and curl up, you may trim the loose edges. Do not remove tape strips completely unless your doctor says it is okay. °· Keep your surgical cut clean and dry. Do not take baths, swim, or use a hot tub until your doctor says it is okay. °· Check your surgical cut and the area around it every day for: °? Redness. °? Swelling. °? Fluid. °Physical Activity °· Return to your normal activities as told by your doctor. Ask your doctor what activities are safe for you. Rest and protect your back as much as you can. °· Follow instructions from your doctor about how to move. Use good posture to help your spine heal. °· Do not lift anything that is heavier than 8 lb (3.6 kg) or as told by your doctor until he or she says that it is safe. Do not lift anything over your  head. °· Do not twist or bend at the waist until your doctor says it is okay. °· Avoid pushing or pulling motions. °· Do not sit or lie down in the same position for long periods of time. °· Do not start to exercise until your doctor says it is okay. Ask your doctor what kinds of exercise you can do to make your back stronger. °General instructions °· If you were given a brace, use it as told by your doctor. °· Wear compression stockings as told by your doctor. °· Do not use tobacco products. These include cigarettes, chewing tobacco, or e-cigarettes. If you need help quitting, ask your doctor. °· Keep all follow-up visits as told by your doctor. This is important. This includes any visits with your physical therapist, if this applies. °Contact a doctor if: °· Your pain gets worse. °· Your medicine does not help your pain. °· Your legs or feet become painful or swollen. °· Your surgical cut is red, swollen, or painful. °· You have fluid, blood, or pus coming from your surgical cut. °· You feel sick to your stomach (nauseous). °· You throw up (vomit). °· Your have weakness or loss of feeling (numbness) in your legs that is new or getting worse. °· You have a fever. °· You have trouble controlling when you pee (urinate) or poop (have a bowel movement). °Get help right away if: °· Your pain is very bad. °· You have   chest pain. °· You have trouble breathing. °· You start to have a cough. °These symptoms may be an emergency. Do not wait to see if the symptoms will go away. Get medical help right away. Call your local emergency services (911 in the [place] Do not drive yourself to the hospital. °This information is not intended to replace advice given to you by your health care provider. Make sure you discuss any questions you have with your health care provider. °Document Released: 05/12/2010 Document Revised: 09/14/2015 Document Reviewed: 07/01/2014 °Elsevier Interactive Patient Education © 2018 [employer] ° °

## 2017-03-07 NOTE — Op Note (Signed)
Operative report.  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  Postoperative diagnosis: Same.  Operative procedure: Lateral interbody fusion L3-4.  Posterior pedicle screw fixation (segmental) L3-4  First Assistant: Ronette Deter, [place].  Implants: 1.  Nuvasive titanium 3D printed lateral interbody cage.  12 by 18 x 50 x 10 degree lordosis.   2.  Allograft: osteocel and vivogen   3. Nuvasive MIS pedicle screws.  6.5 x 40 (x 2)  Intraoperative neuro monitoring: Both pedicle screws were stimulated, no activity at greater than 40 mA.  No adverse free running EMG activity during the lateral interbody cage to indicate injury to the lumbar plexus.  Indications: Gabrielle [name] is a very pleasant young woman who several years ago underwent a TLIF L4-5.  Patient has done exceptionally well until recently when she had recurrent low back pain buttock pain and neuropathic leg pain.  Imaging studies demonstrated adjacent segment degenerative disease at L3-4 with lumbar spinal stenosis.  As result of the failure of conservative measures we elected to move forward with surgery.  All appropriate risks benefits and alternatives were discussed with the patient and consent was obtained.  Operative note: Patient was brought the operating room placed upon the operating room table.  After successful induction of general anesthesia and endotracheally patient teds SCDs and a Foley were applied and the neuro monitoring leads were attached to the patient she was turned into the lateral decubitus position (left side up) and an axillary roll was placed.  The knee and heel were also padded.  The arm was placed in a well arm holder and pillows were placed in between the legs to ensure that all bony prominences were well-padded.  The patient was secured to the table with tape confirming that L3-4 level was properly centered and the patient was  adequately positioned on the table.  Once I assured myself that the patient properly positioned the lateral aspect of the flank was prepped and draped in standard fashion.  Timeout was taken to confirm patient procedure and all other important data.  Fluoroscopy view in the AP plane was used to identify the anterior and posterior margins of the L3-4 disc space.  I marked this out and infiltrated the area with quarter percent Marcaine with an incision was made and I sharply dissected down to the fascia of the external oblique.  A second incision was made 1 fingerbreadth posterior and I bluntly dissected down through this incision to the retroperitoneal fascia I bluntly dissected into the retroperitoneum and begin mobilizing the adipose tissue.  At this point I palpate the surface of the iliopsoas muscle.  I then dissected on the undersurface of the external oblique until I could see my finger in the the first wound  I then placed first dilating tube on my finger and brought it down to the surface of the iliopsoas.  I stimulated circumferentially to ensure that I was not directly on top of the bar plexus.  I then advanced the dilator through the psoas doctored on the anterior third of the disc space.  I placed the trocar to hold it in place.  I then sequentially dilated stimulating with each dilatation in a circumferential manner to confirm satisfactory position.  I then placed the working trocar and exposed the lateral aspect of the disc space.  Once I had placed the trocar and appropriate position I stimulated behind the blades to ensure that the lumbar plexus was not  being traumatized.  I then secured the working trocar to the disc space with the shim and then placed my lites into the I now had excellent visualization of the lateral aspect of the disc space.  An annulotomy was performed with a 10 blade scalpel and then using pituitary rongeurs curettes and Kerrison rongeurs I removed all of the disc material I  then used a Cobb elevator to release the contralateral annulus.  Once this was done I then used my trial devices starting with a 6 x 18 trial eventually dilated up until I could place the 12 lordotic trial.  This provided excellent fixation and also significant distraction of the disc space and indirect increase in foraminal volume.  I was very pleased with this cage and so I obtained it and it was packed with the allograft bone.  I then rasped the endplates and so I had ensured bleeding subchondral bone and then malleted the permanent implant into place.  At excellent fixation.  I then tried to place a lateral plate but unfortunately the previous pedicle screw at L4 that was preventing me from placing this plate.  At this point I elected to move forward with the unilateral pedicle screw fixation instead of the lateral plate.  I did call out to the patient's husband to inform him of the change.  At this point I irrigated the wound copiously normal saline and closed in a layered fashion with interrupted #1 Vicryl suture, 2-0 Vicryl suture, 3-0 Monocryl.  Once both wounds were closed and dry dressings were applied I removed the drapes and placed the patient supine on the table.  Jackson spine frame was then brought into the surgical suite and the patient was turned prone on the [place] spine frame.  All bony prominences well-padded and the back was now prepped and draped in a standard fashion.  The patient had previous left-sided L L4-5 pedicle screw fixation and so I elected to put a unilateral pedicle screw construct on the right side at L3-4.  This would be adequate fixation supplement and stabilize the anterior construct.  Identified the lateral border of the L3 pedicle on x-ray made a small incision and advanced the Jamshidi needle percutaneously down to the lateral aspect of the pedicle.  I then advanced the Jamshidi needle using direct stimulation as well as fluoroscopy until I was nearing the medial wall  of the pedicle once I confirmed this I went to the lateral plane to confirm that I was beyond the posterior wall of the vertebral body.  Once I confirm this I then continue to insert the Jamshidi needle.  To the appropriate depth.  I then placed the guidepin through the trocar and removed it.  I repeated this exact same procedure at L5 4.  Once both pedicles were cannulated I then measured and placed the appropriate size pedicle screw (40 mm length) into the pedicles.  I then directly stimulated both screws and there was no adverse activity at 40 mA.  X-rays also confirmed satisfactory trajectory and positioning of the pedicle screw construct.  A 40 mm rod was then secured into place and the top locking nuts were placed.  Both were torqued off according manufacturer standards.  At this point I irrigated the posterior wound copiously with normal saline and closed in a layered fashion with interrupted #1 Vicryl suture, 2-0 Vicryl suture and 3-0 Monocryl.  Steri-Strips were applied to the wound and bulky dry dressings were applied.  Patient was ultimately extubated transferred  the PACU without incident.  The end of the case all needle sponge counts were correct there were no adverse intraoperative events.

## 2017-03-07 NOTE — Anesthesia Preprocedure Evaluation (Signed)
Anesthesia Evaluation  Patient identified by MRN, date of birth, ID band Patient awake    Reviewed: Allergy & Precautions, H&P , Patient's Chart, lab work & pertinent test results, reviewed documented beta blocker date and time   Airway Mallampati: II  TM Distance: >3 FB Neck ROM: full    Dental no notable dental hx.    Pulmonary former smoker,    Pulmonary exam normal breath sounds clear to auscultation       Cardiovascular hypertension,  Rhythm:regular Rate:Normal     Neuro/Psych    GI/Hepatic   Endo/Other    Renal/GU      Musculoskeletal   Abdominal   Peds  Hematology   Anesthesia Other Findings Left ventricle: The cavity size was normal. Systolic function was   normal. The estimated ejection fraction was in the range of 55%   to 60%.   Reproductive/Obstetrics                             Anesthesia Physical Anesthesia Plan  ASA: II  Anesthesia Plan: General   Post-op Pain Management:    Induction: Intravenous  PONV Risk Score and Plan: 2 and Dexamethasone, Ondansetron and Treatment may vary due to age or medical condition  Airway Management Planned: Oral ETT  Additional Equipment:   Intra-op Plan:   Post-operative Plan: Extubation in OR  Informed Consent: I have reviewed the patients History and Physical, chart, labs and discussed the procedure including the risks, benefits and alternatives for the proposed anesthesia with the patient or authorized representative who has indicated his/her understanding and acceptance.   Dental Advisory Given  Plan Discussed with: CRNA and Surgeon  Anesthesia Plan Comments: (  )        Anesthesia Quick Evaluation

## 2017-03-08 ENCOUNTER — Encounter (HOSPITAL_COMMUNITY): Payer: Self-pay | Admitting: Orthopedic Surgery

## 2017-03-08 NOTE — Evaluation (Signed)
Occupational Therapy Evaluation and Discharge Patient Details Name: Gabrielle [name] MRN: [id number] DOB: [birth year] Today's Date: 03/08/2017    History of Present Illness Pt is a 72 y.o. female with a PMH significant for lumbar stenosis, neuropathy, OA, and STEMI. Patient is s/p lumbar fusion L3-4.     Clinical Impression   Pt reports she was independent with ADL PTA. Currently pt supervision-min guard with ADL and functional mobility. All back, safety, and ADL education completed with pt and husband. Pt planning to d/c home with 24/7 supervision from family. No further acute OT needs identified; signing off at this time. Please re-consult if needs change. Thank you for this referral.    Follow Up Recommendations  No OT follow up;Supervision - Intermittent    Equipment Recommendations  None recommended by OT    Recommendations for Other Services       Precautions / Restrictions Precautions Precautions: Back;Fall Precaution Booklet Issued: No Precaution Comments: Pt able to recall 3/3 back precautions Required Braces or Orthoses: Spinal Brace Spinal Brace: Lumbar corset;Applied in sitting position Restrictions Weight Bearing Restrictions: No      Mobility Bed Mobility Overal bed mobility: Needs Assistance Bed Mobility: Rolling;Sidelying to Sit;Sit to Supine Rolling: Supervision Sidelying to sit: Supervision   Sit to supine: Min guard   General bed mobility comments: Min guard for feet back into bed. HOB flat without use of bed rails.  Transfers Overall transfer level: Needs assistance Equipment used: Rolling walker (2 wheeled) Transfers: Sit to/from Stand Sit to Stand: Supervision         General transfer comment: for safety, cues for hand placement    Balance Overall balance assessment: Needs assistance Sitting-balance support: Feet supported;No upper extremity supported Sitting balance-Leahy Scale: Good     Standing balance support: Bilateral upper  extremity supported Standing balance-Leahy Scale: Poor                             ADL either performed or assessed with clinical judgement   ADL Overall ADL's : Needs assistance/impaired Eating/Feeding: Independent;Sitting   Grooming: Supervision/safety;Standing Grooming Details (indicate cue type and reason): Educated on use of 2 cups for oral care Upper Body Bathing: Set up;Sitting   Lower Body Bathing: Min guard;Sit to/from stand   Upper Body Dressing : Set up;Sitting Upper Body Dressing Details (indicate cue type and reason): for brace, educated on management and wear Lower Body Dressing: Min guard;Sit to/from stand Lower Body Dressing Details (indicate cue type and reason): Pt able to cross foot over opposite knee, educated on compensatory strategies Toilet Transfer: Supervision/safety;Ambulation;BSC;RW Toilet Transfer Details (indicate cue type and reason): Simulated by sit to stand from EOB with functional mobility   Toileting - Clothing Manipulation Details (indicate cue type and reason): Educated on proper technqiue for peri care without twisting and use of wet wipes; husband to assist as needed Tub/ Shower Transfer: Supervision/safety;Walk-in shower;Ambulation;Shower seat;Rolling walker;Grab Paediatric nurse Details (indicate cue type and reason): Recommend use of shower seat initially and supervision Functional mobility during ADLs: Supervision/safety;Rolling walker General ADL Comments: Educated pt on maintaining back precautions during functional activities, keeping frequently used items at counter top height, log roll for bed mobility, frequent mobility throghout the day upon return home     Vision         Perception     Praxis      Pertinent Vitals/Pain Pain Assessment: Faces Faces Pain Scale: Hurts even more Pain Location: back  Pain Descriptors / Indicators: Aching;Sore Pain Intervention(s): Monitored during session     Hand Dominance  Right   Extremity/Trunk Assessment Upper Extremity Assessment Upper Extremity Assessment: Overall WFL for tasks assessed   Lower Extremity Assessment Lower Extremity Assessment: Defer to PT evaluation   Cervical / Trunk Assessment Cervical / Trunk Assessment: Other exceptions Cervical / Trunk Exceptions: s/p spine sx   Communication Communication Communication: No difficulties   Cognition Arousal/Alertness: Awake/alert Behavior During Therapy: WFL for tasks assessed/performed Overall Cognitive Status: Within Functional Limits for tasks assessed                                     General Comments       Exercises     Shoulder Instructions      Home Living Family/patient expects to be discharged to:: Private residence Living Arrangements: Spouse/significant other Available Help at Discharge: Family;Available 24 hours/day Type of Home: House       Home Layout: Two level;Bed/bath upstairs     Bathroom Shower/Tub: Walk-in Psychologist, prison and probation services: Standard     Home Equipment: Cane - single point;Walker - 2 wheels;Shower seat - built in;Toilet riser;Grab bars - tub/shower;Hand held shower head          Prior Functioning/Environment Level of Independence: Independent with assistive device(s)        Comments: occasional use of cane for mobility, independent with ADL        OT Problem List:        OT Treatment/Interventions:      OT Goals(Current goals can be found in the care plan section) Acute Rehab OT Goals Patient Stated Goal: return home today OT Goal Formulation: All assessment and education complete, DC therapy  OT Frequency:     Barriers to D/C:            Co-evaluation              AM-PAC PT "6 Clicks" Daily Activity     Outcome Measure Help from another person eating meals?: None Help from another person taking care of personal grooming?: A Little Help from another person toileting, which includes using toliet,  bedpan, or urinal?: A Little Help from another person bathing (including washing, rinsing, drying)?: A Little Help from another person to put on and taking off regular upper body clothing?: None Help from another person to put on and taking off regular lower body clothing?: A Little 6 Click Score: 20   End of Session Equipment Utilized During Treatment: Rolling walker;Back brace Nurse Communication: Mobility status;Other (comment)(no equipment or f/u needs)  Activity Tolerance: Patient tolerated treatment well Patient left: in bed;with call bell/phone within reach;with family/visitor present  OT Visit Diagnosis: Pain;Unsteadiness on feet (R26.81) Pain - part of body: (back)                Time: 9735-3299 OT Time Calculation (min): 26 min Charges:  OT General Charges $OT Visit: 1 Visit OT Evaluation $OT Eval Moderate Complexity: 1 Mod OT Treatments $Self Care/Home Management : 8-22 mins G-Codes:     Tomoya Ringwald A. Ulice Brilliant, M.S., OTR/L Pager: [place] 03/08/2017, 9:40 AM

## 2017-03-08 NOTE — Progress Notes (Signed)
Pt doing well. Pt and husband given D/C instructions with Rx's, verbal understanding was provided. Pt's incision is clean and dry with no sign of infection. Pt's IV was removed prior to D/C. Pt D/C'd home via wheelchair per MD order. Pt is stable @ D/C and has no other needs at this time. Sharma Lawrance, RN  

## 2017-03-08 NOTE — Progress Notes (Signed)
    Subjective: 1 Day Post-Op Procedure(s) (LRB): XLIF L3-4 (N/A) Patient reports pain as 4 on 0-10 scale.   Denies CP or SOB.  Foley just removed. Positive flatus. Pt ambulating in hallway Objective: Vital signs in last 24 hours: Temp:  [96.8 F (36 C)-98.3 F (36.8 C)] 98.1 F (36.7 C) (02/07 0400) Pulse Rate:  [74-93] 89 (02/07 0400) Resp:  [8-20] 18 (02/07 0400) BP: (121-152)/(57-88) 134/68 (02/07 0400) SpO2:  [94 %-100 %] 99 % (02/07 0400) Weight:  [82.8 kg (182 lb 8 oz)] 82.8 kg (182 lb 8 oz) (02/06 1026)  Intake/Output from previous day: 02/06 0701 - 02/07 0700 In: 1120 [P.O.:120; I.V.:1000] Out: 1100 [Urine:1000; Blood:100] Intake/Output this shift: No intake/output data recorded.  Labs: Recent Labs    03/06/17 1545  HGB 10.8*   Recent Labs    03/06/17 1545  WBC 7.5  RBC 4.29  HCT 34.6*  PLT 203   Recent Labs    03/06/17 1545  NA 131*  K 3.9  CL 96*  CO2 21*  BUN 25*  CREATININE 1.13*  GLUCOSE 118*  CALCIUM 9.5   No results for input(s): LABPT, INR in the last 72 hours.  Physical Exam: Neurologically intact ABD soft Sensation intact distally Dorsiflexion/Plantar flexion intact Incision: scant drainage Compartment soft Body mass index is 35.06 kg/m.   Assessment/Plan: 1 Day Post-Op Procedure(s) (LRB): XLIF L3-4 (N/A) Advance diet Up with therapy  Pt may DC home after cleared from PT and is urinating  Masaru Chamberlin, Darla Lesches for Dr. Melina Schools [facility] [phone] 03/08/2017, 7:26 AM

## 2017-03-08 NOTE — Evaluation (Signed)
Physical Therapy Evaluation Patient Details Name: Gabrielle [name] MRN: [id number] DOB: Apr 09, 1945 Today's Date: 03/08/2017   History of Present Illness  Pt is a 72 y.o. female with a PMH significant for lumbar stenosis, neuropathy, OA, and STEMI. Patient is s/p lumbar fusion L3-4.    Clinical Impression  Pt admitted with above diagnosis. Pt currently with functional limitations due to the deficits listed below (see PT Problem List). At the time of PT eval pt was able to perform transfers and ambulation with gross min guard assist for balance support and safety. Pt was educated on car transfer, stair negotiation, precautions, and brace application/wearing schedule. Pt will benefit from skilled PT to increase their independence and safety with mobility to allow discharge to the venue listed below.       Follow Up Recommendations No PT follow up;Supervision for mobility/OOB    Equipment Recommendations  None recommended by PT    Recommendations for Other Services       Precautions / Restrictions Precautions Precautions: Back;Fall Precaution Booklet Issued: No Precaution Comments: Pt able to recall 3/3 back precautions Required Braces or Orthoses: Spinal Brace Spinal Brace: Lumbar corset;Applied in sitting position Restrictions Weight Bearing Restrictions: No      Mobility  Bed Mobility Overal bed mobility: Needs Assistance Bed Mobility: Rolling;Sidelying to Sit;Sit to Supine Rolling: Supervision Sidelying to sit: Supervision   Sit to supine: Min guard   General bed mobility comments: Min guard for feet back into bed. HOB flat without use of bed rails.  Transfers Overall transfer level: Needs assistance Equipment used: Rolling walker (2 wheeled) Transfers: Sit to/from Stand Sit to Stand: Supervision         General transfer comment: for safety, cues for hand placement  Ambulation/Gait Ambulation/Gait assistance: Min guard Ambulation Distance (Feet): 300  Feet Assistive device: Rolling walker (2 wheeled) Gait Pattern/deviations: Step-through pattern;Narrow base of support;Decreased stride length Gait velocity: decreased Gait velocity interpretation: Below normal speed for age/gender General Gait Details: Minimal cuing required for maintenance of upright posture and hand placement for ambulation with RW.   Stairs Stairs: Yes Stairs assistance: Min guard Stair Management: One rail Right;One rail Left Number of Stairs: 10 General stair comments: VC required for sequencing and safety during navigation of stairs. Stair education was reviewed with husband on assistance with patient while at home.   Wheelchair Mobility    Modified Rankin (Stroke Patients Only)       Balance Overall balance assessment: Needs assistance Sitting-balance support: Feet supported;No upper extremity supported Sitting balance-Leahy Scale: Good     Standing balance support: Bilateral upper extremity supported Standing balance-Leahy Scale: Poor                               Pertinent Vitals/Pain Pain Assessment: Faces Faces Pain Scale: Hurts even more Pain Location: back Pain Descriptors / Indicators: Aching;Sore Pain Intervention(s): Limited activity within patient's tolerance;Monitored during session;Repositioned    Home Living Family/patient expects to be discharged to:: Private residence Living Arrangements: Spouse/significant other Available Help at Discharge: Family;Available 24 hours/day Type of Home: House Home Access: Stairs to enter Entrance Stairs-Rails: None Entrance Stairs-Number of Steps: 2 Home Layout: Two level;Bed/bath upstairs Home Equipment: Cane - single point;Walker - 2 wheels;Shower seat - built in;Toilet riser;Grab bars - tub/shower;Hand held shower head      Prior Function Level of Independence: Independent with assistive device(s)         Comments: occasional use of  cane for mobility, independent with ADL      Hand Dominance   Dominant Hand: Right    Extremity/Trunk Assessment   Upper Extremity Assessment Upper Extremity Assessment: Overall WFL for tasks assessed    Lower Extremity Assessment Lower Extremity Assessment: Generalized weakness(consistent with pre-op diagnosis)    Cervical / Trunk Assessment Cervical / Trunk Assessment: Other exceptions Cervical / Trunk Exceptions: s/p spine sx  Communication   Communication: No difficulties  Cognition Arousal/Alertness: Awake/alert Behavior During Therapy: WFL for tasks assessed/performed Overall Cognitive Status: Within Functional Limits for tasks assessed                                        General Comments      Exercises     Assessment/Plan    PT Assessment Patient needs continued PT services  PT Problem List Decreased strength;Decreased range of motion;Decreased activity tolerance;Decreased balance;Decreased mobility;Decreased knowledge of use of DME;Decreased safety awareness;Decreased knowledge of precautions;Pain       PT Treatment Interventions DME instruction;Gait training;Stair training;Functional mobility training;Therapeutic activities;Therapeutic exercise;Neuromuscular re-education;Patient/family education    PT Goals (Current goals can be found in the Care Plan section)  Acute Rehab PT Goals Patient Stated Goal: return home today PT Goal Formulation: With patient/family Time For Goal Achievement: 03/22/17 Potential to Achieve Goals: Good    Frequency Min 5X/week   Barriers to discharge        Co-evaluation               AM-PAC PT "6 Clicks" Daily Activity  Outcome Measure Difficulty turning over in bed (including adjusting bedclothes, sheets and blankets)?: None Difficulty moving from lying on back to sitting on the side of the bed? : A Little Difficulty sitting down on and standing up from a chair with arms (e.g., wheelchair, bedside commode, etc,.)?: A Little Help  needed moving to and from a bed to chair (including a wheelchair)?: A Little Help needed walking in hospital room?: A Little Help needed climbing 3-5 steps with a railing? : A Little 6 Click Score: 19    End of Session Equipment Utilized During Treatment: Gait belt;Back brace Activity Tolerance: Patient tolerated treatment well Patient left: in bed;with call bell/phone within reach;with family/visitor present Nurse Communication: Mobility status PT Visit Diagnosis: Unsteadiness on feet (R26.81);Pain;Difficulty in walking, not elsewhere classified (R26.2) Pain - part of body: (back)    Time: 9629-5284 PT Time Calculation (min) (ACUTE ONLY): 23 min   Charges:   PT Evaluation $PT Eval Moderate Complexity: 1 Mod PT Treatments $Gait Training: 8-22 mins   PT G Codes:        Rolinda Roan, PT, DPT Acute Rehabilitation Services Pager: [phone]   Thelma Comp 03/08/2017, 10:52 AM

## 2017-03-13 NOTE — Discharge Summary (Signed)
Physician Discharge Summary  Patient ID: Gabrielle [name] MRN: [id number] DOB/AGE: 72-14-1947 72 y.o.  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 Arthritis 01-17-11   degenerative disc disease-all joints  . Bursitis   . Cholesterol serum elevated 01-17-11   tx. meds  . GERD (gastroesophageal reflux disease) 01-17-11   tx. omeprazole  . Heart murmur    slight murmur  . High cholesterol   . Hypertension 01-17-11   tx. meds  . Hypothyroidism   . Leg pain    ABIs 2/18: normal bilaterally  . Neuropathy    FEET  . Osteoarthritis   . Peripheral neuropathy   . Seasonal allergies   . Sinus problem 01-17-11   tx. Claritin D  . ST elevation (STEMI) myocardial infarction involving left circumflex coronary artery ([place]) 08/21/2015    Surgeries: Procedure(s): XLIF L3-4 on 03/07/2017   Consultants (if any):   Discharged Condition: Improved  Hospital Course: Gabrielle [name] is an 72 y.o. female who was admitted 03/07/2017 with a diagnosis of Adjacent segment lumbar DDD with spinal stenosis and went to the operating room on 03/07/2017 and underwent the above named procedures.  Post op day one pt reports low level of pain controlled on oral medication.  Pt is ambulating in hallway.  Pt is cleared by PT for DC.   She was given perioperative antibiotics:  Anti-infectives (From admission, onward)   Start     Dose/Rate Route Frequency Ordered Stop   03/08/17 0200  vancomycin (VANCOCIN) IVPB 1000 mg/200 mL premix     1,000 mg 200 mL/hr over 60 Minutes Intravenous  Once 03/07/17 2003 03/08/17 0300   03/07/17 1145  vancomycin (VANCOCIN) IVPB 1000 mg/200 mL premix     1,000 mg 200 mL/hr over 60 Minutes Intravenous 60 min pre-op 03/07/17 1028 03/07/17 1406    .  She was given sequential compression devices, early  ambulation, and TED for DVT prophylaxis.  She benefited maximally from the hospital stay and there were no complications.    Recent vital signs:  Vitals:   03/08/17 0748 03/08/17 1137  BP: 120/67 127/62  Pulse: 99 86  Resp: 18 18  Temp: 99.2 F (37.3 C) 98.3 F (36.8 C)  SpO2: 99%     Recent laboratory studies:  Lab Results  Component Value Date   HGB 10.8 (L) 03/06/2017   HGB 11.6 (L) 08/24/2015   HGB 11.5 (L) 08/22/2015   Lab Results  Component Value Date   WBC 7.5 03/06/2017   PLT 203 03/06/2017   Lab Results  Component Value Date   INR 0.91 01/17/2011   Lab Results  Component Value Date   NA 131 (L) 03/06/2017   K 3.9 03/06/2017   CL 96 (L) 03/06/2017   CO2 21 (L) 03/06/2017   BUN 25 (H) 03/06/2017   CREATININE 1.13 (H) 03/06/2017   GLUCOSE 118 (H) 03/06/2017    Discharge Medications:   Allergies as of 03/08/2017      Reactions   Penicillins Other (See Comments)   Allergy as an infant - no other information available Has patient had a PCN reaction causing immediate rash, facial/tongue/throat swelling, SOB or lightheadedness with hypotension: Unknown Has patient had a PCN reaction causing severe rash involving mucus membranes or skin necrosis: Unknown Has patient had a PCN reaction that required  hospitalization: No Has patient had a PCN reaction occurring within the last 10 years: No If all of the above answers are "NO", then may proceed with Cephalospor   Pravastatin Other (See Comments)   Effects liver function      Medication List    STOP taking these medications   aspirin EC 81 MG tablet   NON FORMULARY     TAKE these medications   acetaminophen 500 MG tablet Commonly known as:  TYLENOL Take 2,000 mg by mouth daily. Takes 4 tablets every morning   benazepril 20 MG tablet Commonly known as:  LOTENSIN Take 20 mg by mouth 2 (two) times daily.   CALCIUM 600 + D PO Take 1 tablet by mouth 2 (two) times daily.   docusate sodium 100 MG  capsule Commonly known as:  COLACE Take 100 mg by mouth daily.   fish oil-omega-3 fatty acids 1000 MG capsule Take 1 g by mouth at bedtime.   gabapentin 600 MG tablet Commonly known as:  NEURONTIN Take 600 mg by mouth 2 (two) times daily.   GLUCOSAMINE CHONDR 1500 COMPLX PO Take 2 tablets by mouth daily.   HM SALONPAS PAIN RELIEF 1.2-5.7-6.3 % Ptch Generic drug:  Camphor-Menthol-Methyl Sal Apply 0.5 patches topically daily.   levothyroxine 75 MCG tablet Commonly known as:  SYNTHROID, LEVOTHROID Take 75 mcg by mouth daily before breakfast.   loratadine-pseudoephedrine 10-240 MG 24 hr tablet Commonly known as:  CLARITIN-D 24-hour Take 1 tablet by mouth at bedtime.   Melatonin 5 MG Caps Take 10 mg by mouth at bedtime.   methocarbamol 500 MG tablet Commonly known as:  ROBAXIN Take 1 tablet (500 mg total) by mouth 3 (three) times daily.   metoprolol tartrate 25 MG tablet Commonly known as:  LOPRESSOR TAKE ONE-HALF TABLET BY MOUTH 2 (TWO) TIMES DAILY.   nitroGLYCERIN 0.4 MG SL tablet Commonly known as:  NITROSTAT Place 1 tablet (0.4 mg total) under the tongue every 5 (five) minutes x 3 doses as needed for chest pain.   omeprazole 20 MG capsule Commonly known as:  PRILOSEC Take 20 mg by mouth daily with breakfast.   ondansetron 4 MG disintegrating tablet Commonly known as:  ZOFRAN ODT Take 1 tablet (4 mg total) by mouth every 8 (eight) hours as needed for nausea or vomiting.   polyethylene glycol packet Commonly known as:  MIRALAX / GLYCOLAX Take 17 g by mouth daily as needed for mild constipation. Mix with 8 oz liquid and drink   potassium chloride 10 MEQ tablet Commonly known as:  K-DUR,KLOR-CON Take 10 mEq by mouth daily.   rosuvastatin 20 MG tablet Commonly known as:  CRESTOR Take 1 tablet (20 mg total) by mouth at bedtime.   rosuvastatin 40 MG tablet Commonly known as:  CRESTOR Take 40 mg by mouth at bedtime.   triamterene-hydrochlorothiazide 37.5-25 MG  tablet Commonly known as:  MAXZIDE-25 Take 1 tablet by mouth daily.   Vitamin D 2000 units Caps Take 2,000 Units by mouth daily.     ASK your doctor about these medications   oxyCODONE-acetaminophen 10-325 MG tablet Commonly known as:  PERCOCET Take 1 tablet by mouth every 4 (four) hours as needed for up to 5 days for pain. Ask about: Should I take this medication?       Diagnostic Studies: Dg Lumbar Spine 2-3 Views  Result Date: 03/07/2017 CLINICAL DATA:  L3-4 fixation. EXAM: LUMBAR SPINE - 2-3 VIEW COMPARISON:  None. FINDINGS: By the end of the study, right  L3-4 pedicle rods and screws were placed. A disc spacer device was placed at the same level. Hardware in good position. IMPRESSION: Hardware placement as above. Electronically Signed   By: Dorise Bullion III M.D   On: 03/07/2017 19:31   Dg C-arm 1-60 Min  Result Date: 03/07/2017 CLINICAL DATA:  Surgery. FLUOROSCOPY TIME:  3 minutes and 59 seconds. Images: 6 EXAM: DG C-ARM 61-120 MIN COMPARISON:  None. FINDINGS: At the beginning of the study, pedicle rods and screws at L4-5 on the left and a disc spacer device at the same level is identified. Throughout the study, a disc spacer device is placed at L3-4 and pedicle rods and screws are placed at the same levels on the right. IMPRESSION: Disc spacer device and pedicle rods/screws placed at L3-4 on the right. Electronically Signed   By: Dorise Bullion III M.D   On: 03/07/2017 19:30   Dg C-arm 1-60 Min  Result Date: 03/07/2017 CLINICAL DATA:  Surgery. FLUOROSCOPY TIME:  3 minutes and 59 seconds. Images: 6 EXAM: DG C-ARM 61-120 MIN COMPARISON:  None. FINDINGS: At the beginning of the study, pedicle rods and screws at L4-5 on the left and a disc spacer device at the same level is identified. Throughout the study, a disc spacer device is placed at L3-4 and pedicle rods and screws are placed at the same levels on the right. IMPRESSION: Disc spacer device and pedicle rods/screws placed at L3-4 on  the right. Electronically Signed   By: Dorise Bullion III M.D   On: 03/07/2017 19:30   Dg C-arm 1-60 Min  Result Date: 03/07/2017 CLINICAL DATA:  Surgery. FLUOROSCOPY TIME:  3 minutes and 59 seconds. Images: 6 EXAM: DG C-ARM 61-120 MIN COMPARISON:  None. FINDINGS: At the beginning of the study, pedicle rods and screws at L4-5 on the left and a disc spacer device at the same level is identified. Throughout the study, a disc spacer device is placed at L3-4 and pedicle rods and screws are placed at the same levels on the right. IMPRESSION: Disc spacer device and pedicle rods/screws placed at L3-4 on the right. Electronically Signed   By: Dorise Bullion III M.D   On: 03/07/2017 19:30    Disposition: 01-Home or Self Care Pt will present to clinic in 2 weeks Post op meds provided  Discharge Instructions    Incentive spirometry RT   Complete by:  As directed       Follow-up Information    Melina Schools, MD Follow up.   Specialty:  Orthopedic Surgery Contact information: [address] [place] [address] [phone]            Signed: Valinda Hoar 03/13/2017, 11:30 AM

## 2017-04-02 ENCOUNTER — Other Ambulatory Visit (HOSPITAL_COMMUNITY): Payer: Self-pay | Admitting: Orthopedic Surgery

## 2017-04-02 ENCOUNTER — Ambulatory Visit (HOSPITAL_COMMUNITY)
Admission: RE | Admit: 2017-04-02 | Discharge: 2017-04-02 | Disposition: A | Discharge status: Home / Self Care | Payer: Medicare Other | Source: Ambulatory Visit | Attending: Orthopedic Surgery | Admitting: Orthopedic Surgery

## 2017-04-02 DIAGNOSIS — M47816 Spondylosis without myelopathy or radiculopathy, lumbar region: Secondary | ICD-10-CM | POA: Diagnosis not present

## 2017-04-02 DIAGNOSIS — R609 Edema, unspecified: Secondary | ICD-10-CM

## 2017-04-02 DIAGNOSIS — I2510 Atherosclerotic heart disease of native coronary artery without angina pectoris: Secondary | ICD-10-CM | POA: Diagnosis not present

## 2017-04-02 DIAGNOSIS — R7301 Impaired fasting glucose: Secondary | ICD-10-CM | POA: Diagnosis not present

## 2017-04-02 DIAGNOSIS — Z0000 Encounter for general adult medical examination without abnormal findings: Secondary | ICD-10-CM | POA: Diagnosis not present

## 2017-04-02 DIAGNOSIS — M79662 Pain in left lower leg: Secondary | ICD-10-CM | POA: Diagnosis not present

## 2017-04-02 DIAGNOSIS — E782 Mixed hyperlipidemia: Secondary | ICD-10-CM | POA: Diagnosis not present

## 2017-04-02 DIAGNOSIS — I10 Essential (primary) hypertension: Secondary | ICD-10-CM | POA: Diagnosis not present

## 2017-04-02 DIAGNOSIS — N189 Chronic kidney disease, unspecified: Secondary | ICD-10-CM | POA: Diagnosis not present

## 2017-04-02 DIAGNOSIS — E039 Hypothyroidism, unspecified: Secondary | ICD-10-CM | POA: Diagnosis not present

## 2017-04-02 DIAGNOSIS — M8580 Other specified disorders of bone density and structure, unspecified site: Secondary | ICD-10-CM | POA: Diagnosis not present

## 2017-04-02 NOTE — Progress Notes (Signed)
Attempted to call Dr. Rolena Infante' office after exam finished to notify the study result, could not reach.  Gabrielle [name] (RDMS, RVT)

## 2017-04-02 NOTE — Progress Notes (Signed)
Preliminary notes by tech--Left lower extremity venous exam completed. Veins demonstrate compressible with flow. No evidence of deep and superficial vein thrombosis.   Hongying Haedyn Breau(RDMS, RVT)

## 2017-04-04 ENCOUNTER — Ambulatory Visit (INDEPENDENT_AMBULATORY_CARE_PROVIDER_SITE_OTHER): Payer: Medicare Other | Admitting: Cardiovascular Disease

## 2017-04-04 ENCOUNTER — Encounter (INDEPENDENT_AMBULATORY_CARE_PROVIDER_SITE_OTHER): Payer: Self-pay

## 2017-04-04 ENCOUNTER — Encounter: Payer: Self-pay | Admitting: Cardiovascular Disease

## 2017-04-04 VITALS — BP 110/60 | HR 81 | Ht 60.5 in | Wt 183.8 lb

## 2017-04-04 DIAGNOSIS — E785 Hyperlipidemia, unspecified: Secondary | ICD-10-CM

## 2017-04-04 DIAGNOSIS — M8580 Other specified disorders of bone density and structure, unspecified site: Secondary | ICD-10-CM | POA: Diagnosis not present

## 2017-04-04 DIAGNOSIS — I10 Essential (primary) hypertension: Secondary | ICD-10-CM | POA: Diagnosis not present

## 2017-04-04 DIAGNOSIS — E871 Hypo-osmolality and hyponatremia: Secondary | ICD-10-CM | POA: Diagnosis not present

## 2017-04-04 DIAGNOSIS — Z0000 Encounter for general adult medical examination without abnormal findings: Secondary | ICD-10-CM | POA: Diagnosis not present

## 2017-04-04 DIAGNOSIS — E782 Mixed hyperlipidemia: Secondary | ICD-10-CM | POA: Diagnosis not present

## 2017-04-04 DIAGNOSIS — D649 Anemia, unspecified: Secondary | ICD-10-CM | POA: Diagnosis not present

## 2017-04-04 DIAGNOSIS — N189 Chronic kidney disease, unspecified: Secondary | ICD-10-CM | POA: Diagnosis not present

## 2017-04-04 DIAGNOSIS — R7301 Impaired fasting glucose: Secondary | ICD-10-CM | POA: Diagnosis not present

## 2017-04-04 DIAGNOSIS — E039 Hypothyroidism, unspecified: Secondary | ICD-10-CM | POA: Diagnosis not present

## 2017-04-04 DIAGNOSIS — I2510 Atherosclerotic heart disease of native coronary artery without angina pectoris: Secondary | ICD-10-CM | POA: Diagnosis not present

## 2017-04-04 DIAGNOSIS — M47816 Spondylosis without myelopathy or radiculopathy, lumbar region: Secondary | ICD-10-CM | POA: Diagnosis not present

## 2017-04-04 MED ORDER — EZETIMIBE 10 MG PO TABS: 10.0000 mg | ORAL_TABLET | Freq: Every day | ORAL | 3 refills | Status: DC

## 2017-04-04 NOTE — Patient Instructions (Signed)
Medication Instructions:  1) START ZETIA 10 mg daily  Labwork: None  Testing/Procedures: None  Follow-Up: Your provider wants you to follow-up in: 6 months with Dr. Burt Knack. You will receive a reminder letter in the mail two months in advance. If you don't receive a letter, please call our office to schedule the follow-up appointment.    Any Other Special Instructions Will Be Listed Below (If Applicable).     If you need a refill on your cardiac medications before your next appointment, please call your pharmacy.

## 2017-04-04 NOTE — Progress Notes (Signed)
Cardiology Office Note Date:  04/04/2017   ID:  Gabrielle [name], DOB 12/10/1945, MRN [id number]  PCP:  Hulan Fess, MD  Cardiologist:  Sherren Mocha, MD    Chief Complaint  Patient presents with  . Follow-up    CAD     History of Present Illness: Gabrielle [name] is a 72 y.o. female who presents for follow-up of coronary artery disease.  Patient has a history of coronary stenting when she presented with an acute inferolateral STEMI in 2017.  She underwent staged PCI of the right coronary artery during her index hospitalization.  She was treated with drug-eluting stents  in both vessels.  She is here with her husband today. Had back surgery last month. She had some swelling in her left foot after surgery, venous duplex negative for DVT. Symptoms now improved with leg elevation. No cardiac complications with surgery. Denies chest pain or shortness of breath.   Past Medical History:  Diagnosis Date  . Arthritis 01-17-11   degenerative disc disease-all joints  . Bursitis   . Cholesterol serum elevated 01-17-11   tx. meds  . GERD (gastroesophageal reflux disease) 01-17-11   tx. omeprazole  . Heart murmur    slight murmur  . High cholesterol   . Hypertension 01-17-11   tx. meds  . Hypothyroidism   . Leg pain    ABIs 2/18: normal bilaterally  . Neuropathy    FEET  . Osteoarthritis   . Peripheral neuropathy   . Seasonal allergies   . Sinus problem 01-17-11   tx. Claritin D  . ST elevation (STEMI) myocardial infarction involving left circumflex coronary artery ([place]) 08/21/2015    Past Surgical History:  Procedure Laterality Date  . ABDOMINAL HYSTERECTOMY  01-17-11   '93-Hysterectomy-heavy bleeding  . ANTERIOR LATERAL LUMBAR FUSION WITH PERCUTANEOUS SCREW 1 LEVEL N/A 03/07/2017   Procedure: XLIF L3-4;  Surgeon: Melina Schools, MD;  Location: [facility];  Service: Orthopedics;  Laterality: N/A;  4 hrs  . BACK SURGERY  01-17-11   04-20-10-T-Lift lumbar fusion  . [facility] PROCEDURE   01-17-11   Bladder sling  . CARDIAC CATHETERIZATION N/A 08/21/2015   Procedure: Left Heart Cath and Coronary Angiography;  Surgeon: Sherren Mocha, MD;  Location: [facility];  Service: Cardiovascular;  Laterality: N/A;  . CARDIAC CATHETERIZATION N/A 08/21/2015   Procedure: Coronary Stent Intervention;  Surgeon: Sherren Mocha, MD;  Location: [facility];  Service: Cardiovascular;  Laterality: N/A;  . CARDIAC CATHETERIZATION N/A 08/23/2015   Procedure: Coronary Stent Intervention;  Surgeon: Leonie Man, MD;  Location: [facility];  Service: Cardiovascular;  Laterality: N/A;  promus 2.5x16 in RCA  . CARDIAC CATHETERIZATION N/A 08/23/2015   Procedure: Left Heart Cath and Coronary Angiography;  Surgeon: Leonie Man, MD;  Location: [facility];  Service: Cardiovascular;  Laterality: N/A;  . CARPAL TUNNEL RELEASE  01-17-11   '02-left, also right  . COLONOSCOPY    . HARDWARE REMOVAL Right 05/15/2013   Procedure: REMOVAL RIGHT L4-L5 PEDICLE SCREWS AND ROD ;  Surgeon: Melina Schools, MD;  Location: [facility];  Service: Orthopedics;  Laterality: Right;  . JOINT REPLACEMENT  01-17-11    RTKA' 02  . LAPAROSCOPIC APPENDECTOMY N/A 08/13/2013   Procedure: APPENDECTOMY LAPAROSCOPIC;  Surgeon: Pedro Earls, MD;  Location: [facility];  Service: General;  Laterality: N/A;  . TONSILLECTOMY  01-17-11   child  . TOTAL KNEE ARTHROPLASTY  01/19/2011   Procedure: TOTAL KNEE ARTHROPLASTY;  Surgeon: Johnn Hai;  Location: [facility];  Service: Orthopedics;  Laterality: Left;    Current Outpatient Medications  Medication Sig Dispense Refill  . aspirin EC 81 MG tablet Take 81 mg by mouth daily.    . benazepril (LOTENSIN) 20 MG tablet Take 20 mg by mouth 2 (two) times daily.     . Calcium Carbonate-Vitamin D (CALCIUM 600 + D PO) Take 1 tablet by mouth 2 (two) times daily.      . Cholecalciferol (VITAMIN D) 2000 units CAPS Take 2,000 Units by mouth daily.    Marland Kitchen docusate sodium (COLACE) 100  MG capsule Take 100 mg by mouth daily.     . fish oil-omega-3 fatty acids 1000 MG capsule Take 1 g by mouth at bedtime.     . gabapentin (NEURONTIN) 600 MG tablet Take 600 mg by mouth 2 (two) times daily.    . Glucosamine-Chondroit-Vit C-Mn (GLUCOSAMINE CHONDR 1500 COMPLX PO) Take 2 tablets by mouth daily.    Marland Kitchen levothyroxine (SYNTHROID, LEVOTHROID) 75 MCG tablet Take 75 mcg by mouth daily before breakfast.     . loratadine-pseudoephedrine (CLARITIN-D 24-HOUR) 10-240 MG per 24 hr tablet Take 1 tablet by mouth at bedtime.     . Melatonin 5 MG CAPS Take 10 mg by mouth at bedtime.    . metoprolol tartrate (LOPRESSOR) 25 MG tablet TAKE ONE-HALF TABLET BY MOUTH 2 (TWO) TIMES DAILY. 90 tablet 3  . nitroGLYCERIN (NITROSTAT) 0.4 MG SL tablet Place 1 tablet (0.4 mg total) under the tongue every 5 (five) minutes x 3 doses as needed for chest pain. 25 tablet 2  . NON FORMULARY Take 1 Dose by mouth daily. CBD oil    . omeprazole (PRILOSEC) 20 MG capsule Take 20 mg by mouth daily with breakfast.     . polyethylene glycol (MIRALAX / GLYCOLAX) packet Take 17 g by mouth daily as needed for mild constipation. Mix with 8 oz liquid and drink    . potassium chloride (K-DUR,KLOR-CON) 10 MEQ tablet Take 10 mEq by mouth daily.     . rosuvastatin (CRESTOR) 40 MG tablet Take 40 mg by mouth at bedtime.   2  . triamterene-hydrochlorothiazide (MAXZIDE-25) 37.5-25 MG tablet Take 1 tablet by mouth daily.  1  . ezetimibe (ZETIA) 10 MG tablet Take 1 tablet (10 mg total) by mouth daily. 90 tablet 3   No current facility-administered medications for this visit.     Allergies:   Penicillins and Pravastatin   Social History:  The patient  reports that she quit smoking about 33 years ago. Her smoking use included cigarettes. She has a 19.00 pack-year smoking history. she has never used smokeless tobacco. She reports that she drinks about 1.2 oz of alcohol per week. She reports that she does not use drugs.   Family History:  The  patient's family history includes Aneurysm in her mother; Heart disease in her brother and father.    ROS:  Please see the history of present illness.  All other systems are reviewed and negative.    PHYSICAL EXAM: VS:  BP 110/60   Pulse 81   Ht 5' 0.5" (1.537 m)   Wt 183 lb 12.8 oz (83.4 kg)   BMI 35.31 kg/m  , BMI Body mass index is 35.31 kg/m. GEN: Well nourished, well developed, in no acute distress  HEENT: normal  Neck: no JVD, no masses. No carotid bruits Cardiac: RRR with2/6 SEM at the RUSB  Respiratory:  clear to auscultation bilaterally, normal work of breathing GI: soft, nontender, nondistended, + BS MS: no deformity or atrophy  Ext: no pretibial edema, pedal pulses 2+= bilaterally Skin: warm and dry, no rash Neuro:  Strength and sensation are intact Psych: euthymic mood, full affect  EKG:  EKG is ordered today. The ekg ordered today shows normal sinus rhythm 81 bpm, left anterior fascicular block, LVH with repolarization abnormality  Recent Labs: 03/06/2017: BUN 25; Creatinine, Ser 1.13; Hemoglobin 10.8; Platelets 203; Potassium 3.9; Sodium 131   Lipid Panel     Component Value Date/Time   CHOL 180 10/12/2015 0835   TRIG 138 10/12/2015 0835   HDL 74 10/12/2015 0835   CHOLHDL 2.4 10/12/2015 0835   VLDL 28 10/12/2015 0835   LDLCALC 78 10/12/2015 0835      Wt Readings from Last 3 Encounters:  04/04/17 183 lb 12.8 oz (83.4 kg)  03/07/17 182 lb 8 oz (82.8 kg)  03/06/17 182 lb 8 oz (82.8 kg)     Cardiac Studies Reviewed: Echo 08-23-2015: Left ventricle:  The cavity size was normal. Systolic function was normal. The estimated ejection fraction was in the range of 55% to 60%. Wall motion was normal; there were no regional wall motion abnormalities. The transmitral flow pattern was normal. The deceleration time of the early transmitral flow velocity was normal. The pulmonary vein flow pattern was normal. The tissue Doppler parameters were normal.  Left ventricular diastolic function parameters were normal.  ------------------------------------------------------------------- Aortic valve:   Trileaflet; normal thickness, mildly calcified leaflets. Mobility was not restricted.  Doppler:  Transvalvular velocity was within the normal range. There was no stenosis. There was no regurgitation.  ------------------------------------------------------------------- Aorta:  The aorta was normal, not dilated, and non-diseased. Aortic root: The aortic root was normal in size.  ------------------------------------------------------------------- Mitral valve:   Structurally normal valve.   Mobility was not restricted.  Doppler:  Transvalvular velocity was within the normal range. There was no evidence for stenosis. There was no regurgitation.    Peak gradient (D): 4 mm Hg.  ------------------------------------------------------------------- Left atrium:  The atrium was normal in size.  ------------------------------------------------------------------- Atrial septum:  No defect or patent foramen ovale was identified.   ------------------------------------------------------------------- Right ventricle:  The cavity size was normal. Wall thickness was normal. Systolic function was normal.  ------------------------------------------------------------------- Pulmonic valve:    Doppler:  Transvalvular velocity was within the normal range. There was no evidence for stenosis. There was trivial regurgitation.  ------------------------------------------------------------------- Tricuspid valve:   Structurally normal valve.    Doppler: Transvalvular velocity was within the normal range. There was trivial regurgitation.  ------------------------------------------------------------------- Pulmonary artery:   The main pulmonary artery was normal-sized. Systolic pressure was within the normal  range.  ------------------------------------------------------------------- Right atrium:  The atrium was normal in size.  ------------------------------------------------------------------- Pericardium:  The pericardium was normal in appearance. There was no pericardial effusion.  ------------------------------------------------------------------- Systemic veins: Inferior vena cava: The vessel was normal in size. The respirophasic diameter changes were in the normal range (>= 50%), consistent with normal central venous pressure.  ------------------------------------------------------------------- Post procedure conclusions Ascending Aorta:  - The aorta was normal, not dilated, and non-diseased.  ASSESSMENT AND PLAN: 1.  Coronary artery disease, native vessel, without angina: The patient is tolerating her current medical program without problems.  She will follow-up in 6 months.  2.  Hypertension: Blood pressure is well controlled on benazepril, triamterene/HCTZ, and metoprolol  3.  Hyperlipidemia: Lipids are reviewed and LDL cholesterol is 90 mg/dL on maximal dose Crestor.  I recommended adding Zetia 10 mg daily.  She will have labs in a few months as scheduled with Dr. Rex Kras.  Lifestyle modification also discussed today.   Current medicines are reviewed with the patient today.  The patient does not have concerns regarding medicines.  Labs/ tests ordered today include:   Orders Placed This Encounter  Procedures  . EKG 12-Lead    Disposition:   FU one year  Signed, Sherren Mocha, MD  04/04/2017 5:36 PM    [facility] [address] [place], [place]  [address] Phone: [phone]; Fax: [phone]

## 2017-04-20 DIAGNOSIS — Z4889 Encounter for other specified surgical aftercare: Secondary | ICD-10-CM | POA: Diagnosis not present

## 2017-04-20 DIAGNOSIS — M48062 Spinal stenosis, lumbar region with neurogenic claudication: Secondary | ICD-10-CM | POA: Diagnosis not present

## 2017-05-01 DIAGNOSIS — M545 Low back pain: Secondary | ICD-10-CM | POA: Diagnosis not present

## 2017-05-04 DIAGNOSIS — M545 Low back pain: Secondary | ICD-10-CM | POA: Diagnosis not present

## 2017-05-08 DIAGNOSIS — M545 Low back pain: Secondary | ICD-10-CM | POA: Diagnosis not present

## 2017-05-11 DIAGNOSIS — M545 Low back pain: Secondary | ICD-10-CM | POA: Diagnosis not present

## 2017-05-14 DIAGNOSIS — M545 Low back pain: Secondary | ICD-10-CM | POA: Diagnosis not present

## 2017-05-15 DIAGNOSIS — R42 Dizziness and giddiness: Secondary | ICD-10-CM | POA: Diagnosis not present

## 2017-05-15 DIAGNOSIS — D509 Iron deficiency anemia, unspecified: Secondary | ICD-10-CM | POA: Diagnosis not present

## 2017-05-15 DIAGNOSIS — H6121 Impacted cerumen, right ear: Secondary | ICD-10-CM | POA: Diagnosis not present

## 2017-05-15 DIAGNOSIS — Z6835 Body mass index (BMI) 35.0-35.9, adult: Secondary | ICD-10-CM | POA: Diagnosis not present

## 2017-05-16 DIAGNOSIS — M545 Low back pain: Secondary | ICD-10-CM | POA: Diagnosis not present

## 2017-05-29 DIAGNOSIS — M545 Low back pain: Secondary | ICD-10-CM | POA: Diagnosis not present

## 2017-06-01 DIAGNOSIS — M545 Low back pain: Secondary | ICD-10-CM | POA: Diagnosis not present

## 2017-06-04 DIAGNOSIS — Z981 Arthrodesis status: Secondary | ICD-10-CM | POA: Diagnosis not present

## 2017-06-29 DIAGNOSIS — M47816 Spondylosis without myelopathy or radiculopathy, lumbar region: Secondary | ICD-10-CM | POA: Diagnosis not present

## 2017-06-29 DIAGNOSIS — E789 Disorder of lipoprotein metabolism, unspecified: Secondary | ICD-10-CM | POA: Diagnosis not present

## 2017-06-29 DIAGNOSIS — Z0000 Encounter for general adult medical examination without abnormal findings: Secondary | ICD-10-CM | POA: Diagnosis not present

## 2017-06-29 DIAGNOSIS — R7301 Impaired fasting glucose: Secondary | ICD-10-CM | POA: Diagnosis not present

## 2017-06-29 DIAGNOSIS — D509 Iron deficiency anemia, unspecified: Secondary | ICD-10-CM | POA: Diagnosis not present

## 2017-06-29 DIAGNOSIS — N189 Chronic kidney disease, unspecified: Secondary | ICD-10-CM | POA: Diagnosis not present

## 2017-06-29 DIAGNOSIS — D649 Anemia, unspecified: Secondary | ICD-10-CM | POA: Diagnosis not present

## 2017-06-29 DIAGNOSIS — I10 Essential (primary) hypertension: Secondary | ICD-10-CM | POA: Diagnosis not present

## 2017-06-29 DIAGNOSIS — E039 Hypothyroidism, unspecified: Secondary | ICD-10-CM | POA: Diagnosis not present

## 2017-06-29 DIAGNOSIS — R42 Dizziness and giddiness: Secondary | ICD-10-CM | POA: Diagnosis not present

## 2017-06-29 DIAGNOSIS — I2510 Atherosclerotic heart disease of native coronary artery without angina pectoris: Secondary | ICD-10-CM | POA: Diagnosis not present

## 2017-06-29 DIAGNOSIS — E782 Mixed hyperlipidemia: Secondary | ICD-10-CM | POA: Diagnosis not present

## 2017-07-09 DIAGNOSIS — Z981 Arthrodesis status: Secondary | ICD-10-CM | POA: Diagnosis not present

## 2017-08-13 DIAGNOSIS — R42 Dizziness and giddiness: Secondary | ICD-10-CM | POA: Diagnosis not present

## 2017-09-07 DIAGNOSIS — Z981 Arthrodesis status: Secondary | ICD-10-CM | POA: Diagnosis not present

## 2017-10-18 DIAGNOSIS — R195 Other fecal abnormalities: Secondary | ICD-10-CM | POA: Diagnosis not present

## 2017-10-25 DIAGNOSIS — K6389 Other specified diseases of intestine: Secondary | ICD-10-CM | POA: Diagnosis not present

## 2017-10-25 DIAGNOSIS — K317 Polyp of stomach and duodenum: Secondary | ICD-10-CM | POA: Diagnosis not present

## 2017-10-25 DIAGNOSIS — R195 Other fecal abnormalities: Secondary | ICD-10-CM | POA: Diagnosis not present

## 2017-10-30 DIAGNOSIS — K317 Polyp of stomach and duodenum: Secondary | ICD-10-CM | POA: Diagnosis not present

## 2017-11-02 DIAGNOSIS — Z23 Encounter for immunization: Secondary | ICD-10-CM | POA: Diagnosis not present

## 2017-11-16 ENCOUNTER — Encounter: Payer: Self-pay | Admitting: Cardiovascular Disease

## 2017-11-16 ENCOUNTER — Ambulatory Visit (INDEPENDENT_AMBULATORY_CARE_PROVIDER_SITE_OTHER): Payer: Medicare Other | Admitting: Cardiovascular Disease

## 2017-11-16 VITALS — BP 124/68 | HR 88 | Ht 60.0 in | Wt 185.0 lb

## 2017-11-16 DIAGNOSIS — I2510 Atherosclerotic heart disease of native coronary artery without angina pectoris: Secondary | ICD-10-CM | POA: Diagnosis not present

## 2017-11-16 DIAGNOSIS — E782 Mixed hyperlipidemia: Secondary | ICD-10-CM

## 2017-11-16 DIAGNOSIS — I10 Essential (primary) hypertension: Secondary | ICD-10-CM

## 2017-11-16 MED ORDER — NITROGLYCERIN 0.4 MG SL SUBL: 0.4000 mg | SUBLINGUAL_TABLET | SUBLINGUAL | 2 refills | Status: DC | PRN

## 2017-11-16 NOTE — Progress Notes (Signed)
Cardiology Office Note:    Date:  11/16/2017   ID:  Gabrielle [name], DOB 11-05-1945, MRN [id number]  PCP:  Hulan Fess, MD  Cardiologist:  Sherren Mocha, MD  Electrophysiologist:  None   Referring MD: Hulan Fess, MD   Chief Complaint  Patient presents with  . Follow-up    CAD    History of Present Illness:    Gabrielle [name] is a 72 y.o. female with a hx of coronary artery disease with initial presentation in 2017 with an inferolateral STEMI.  She was treated with primary PCI of the left circumflex followed by staged PCI of the right coronary artery during her index hospitalization.  She is had no recurrent ischemic events.  The patient is here with her husband today.  She is doing quite well.  She specifically denies any symptoms of chest pain, chest pressure, or shortness of breath.  Her medications are unchanged.  When she was seen last year, her LDL cholesterol was noted to be above goal.  She was started on ezetimibe as an adjunct to her statin therapy.  She had follow-up labs with Dr. Rex Kras demonstrating a cholesterol of 167, HDL 64, and LDL 67 mg/dL.  Past Medical History:  Diagnosis Date  . Arthritis 01-17-11   degenerative disc disease-all joints  . Bursitis   . Cholesterol serum elevated 01-17-11   tx. meds  . GERD (gastroesophageal reflux disease) 01-17-11   tx. omeprazole  . Heart murmur    slight murmur  . High cholesterol   . Hypertension 01-17-11   tx. meds  . Hypothyroidism   . Leg pain    ABIs 2/18: normal bilaterally  . Neuropathy    FEET  . Osteoarthritis   . Peripheral neuropathy   . Seasonal allergies   . Sinus problem 01-17-11   tx. Claritin D  . ST elevation (STEMI) myocardial infarction involving left circumflex coronary artery ([place]) 08/21/2015    Past Surgical History:  Procedure Laterality Date  . ABDOMINAL HYSTERECTOMY  01-17-11   '93-Hysterectomy-heavy bleeding  . ANTERIOR LATERAL LUMBAR FUSION WITH PERCUTANEOUS SCREW 1 LEVEL N/A  03/07/2017   Procedure: XLIF L3-4;  Surgeon: Melina Schools, MD;  Location: [facility];  Service: Orthopedics;  Laterality: N/A;  4 hrs  . BACK SURGERY  01-17-11   04-20-10-T-Lift lumbar fusion  . [facility] PROCEDURE  01-17-11   Bladder sling  . CARDIAC CATHETERIZATION N/A 08/21/2015   Procedure: Left Heart Cath and Coronary Angiography;  Surgeon: Sherren Mocha, MD;  Location: [facility];  Service: Cardiovascular;  Laterality: N/A;  . CARDIAC CATHETERIZATION N/A 08/21/2015   Procedure: Coronary Stent Intervention;  Surgeon: Sherren Mocha, MD;  Location: [facility];  Service: Cardiovascular;  Laterality: N/A;  . CARDIAC CATHETERIZATION N/A 08/23/2015   Procedure: Coronary Stent Intervention;  Surgeon: Leonie Man, MD;  Location: [facility];  Service: Cardiovascular;  Laterality: N/A;  promus 2.5x16 in RCA  . CARDIAC CATHETERIZATION N/A 08/23/2015   Procedure: Left Heart Cath and Coronary Angiography;  Surgeon: Leonie Man, MD;  Location: [facility];  Service: Cardiovascular;  Laterality: N/A;  . CARPAL TUNNEL RELEASE  01-17-11   '02-left, also right  . COLONOSCOPY    . HARDWARE REMOVAL Right 05/15/2013   Procedure: REMOVAL RIGHT L4-L5 PEDICLE SCREWS AND ROD ;  Surgeon: Melina Schools, MD;  Location: [facility];  Service: Orthopedics;  Laterality: Right;  . JOINT REPLACEMENT  01-17-11    RTKA' 02  .  LAPAROSCOPIC APPENDECTOMY N/A 08/13/2013   Procedure: APPENDECTOMY LAPAROSCOPIC;  Surgeon: Pedro Earls, MD;  Location: [facility];  Service: General;  Laterality: N/A;  . TONSILLECTOMY  01-17-11   child  . TOTAL KNEE ARTHROPLASTY  01/19/2011   Procedure: TOTAL KNEE ARTHROPLASTY;  Surgeon: Johnn Hai;  Location: [facility];  Service: Orthopedics;  Laterality: Left;    Current Medications: Current Meds  Medication Sig  . aspirin EC 81 MG tablet Take 81 mg by mouth daily.  . benazepril (LOTENSIN) 20 MG tablet Take 20 mg by mouth 2 (two) times daily.   . Calcium  Carbonate-Vitamin D (CALCIUM 600 + D PO) Take 1 tablet by mouth 2 (two) times daily.    . Cholecalciferol (VITAMIN D) 2000 units CAPS Take 2,000 Units by mouth daily.  Marland Kitchen docusate sodium (COLACE) 100 MG capsule Take 100 mg by mouth daily.   Marland Kitchen ezetimibe (ZETIA) 10 MG tablet Take 1 tablet (10 mg total) by mouth daily.  . fish oil-omega-3 fatty acids 1000 MG capsule Take 1 g by mouth at bedtime.   . gabapentin (NEURONTIN) 600 MG tablet Take 600 mg by mouth 2 (two) times daily.  . Glucosamine-Chondroit-Vit C-Mn (GLUCOSAMINE CHONDR 1500 COMPLX PO) Take 2 tablets by mouth daily.  Marland Kitchen levothyroxine (SYNTHROID, LEVOTHROID) 75 MCG tablet Take 75 mcg by mouth daily before breakfast.   . loratadine-pseudoephedrine (CLARITIN-D 24-HOUR) 10-240 MG per 24 hr tablet Take 1 tablet by mouth at bedtime.   . Melatonin 5 MG CAPS Take 10 mg by mouth at bedtime.  . metoprolol tartrate (LOPRESSOR) 25 MG tablet TAKE ONE-HALF TABLET BY MOUTH 2 (TWO) TIMES DAILY.  . nitroGLYCERIN (NITROSTAT) 0.4 MG SL tablet Place 1 tablet (0.4 mg total) under the tongue every 5 (five) minutes x 3 doses as needed for chest pain.  . NON FORMULARY Take 1 Dose by mouth daily. CBD oil  . omeprazole (PRILOSEC) 20 MG capsule Take 20 mg by mouth daily with breakfast.   . polyethylene glycol (MIRALAX / GLYCOLAX) packet Take 17 g by mouth daily as needed for mild constipation. Mix with 8 oz liquid and drink  . potassium chloride (K-DUR,KLOR-CON) 10 MEQ tablet Take 10 mEq by mouth daily.   . rosuvastatin (CRESTOR) 40 MG tablet Take 40 mg by mouth at bedtime.   . triamterene-hydrochlorothiazide (MAXZIDE-25) 37.5-25 MG tablet Take 1 tablet by mouth daily.  . [DISCONTINUED] nitroGLYCERIN (NITROSTAT) 0.4 MG SL tablet Place 1 tablet (0.4 mg total) under the tongue every 5 (five) minutes x 3 doses as needed for chest pain.     Allergies:   Penicillins and Pravastatin   Social History   Socioeconomic History  . Marital status: Married    Spouse name:  Not on file  . Number of children: 3  . Years of education: BSN+  . Highest education level: Not on file  Occupational History  . Occupation: Retired  Scientific laboratory technician  . Financial resource strain: Not on file  . Food insecurity:    Worry: Not on file    Inability: Not on file  . Transportation needs:    Medical: Not on file    Non-medical: Not on file  Tobacco Use  . Smoking status: Former Smoker    Packs/day: 1.00    Years: 19.00    Pack years: 19.00    Types: Cigarettes    Last attempt to quit: 08/14/1983    Years since quitting: 34.2  . Smokeless tobacco: Never Used  Substance and Sexual Activity  .  Alcohol use: Yes    Alcohol/week: 2.0 standard drinks    Types: 2 Glasses of wine per week    Comment: per day  . Drug use: No  . Sexual activity: Yes    Partners: Male    Comment: Married  Lifestyle  . Physical activity:    Days per week: Not on file    Minutes per session: Not on file  . Stress: Not on file  Relationships  . Social connections:    Talks on phone: Not on file    Gets together: Not on file    Attends religious service: Not on file    Active member of club or organization: Not on file    Attends meetings of clubs or organizations: Not on file    Relationship status: Not on file  Other Topics Concern  . Not on file  Social History Narrative   Lives at home w/ her husband and granddaughter   Right-handed   Caffeine: 3 cups of tea daily     Family History: The patient's family history includes Aneurysm in her mother; Heart disease in her brother and father. There is no history of Neuropathy.  ROS:   Please see the history of present illness.    All other systems reviewed and are negative.  EKGs/Labs/Other Studies Reviewed:    The following studies were reviewed today: Echo 08-23-2015: Study Conclusions  - Left ventricle: The cavity size was normal. Systolic function was   normal. The estimated ejection fraction was in the range of 55%   to  60%. Wall motion was normal; there were no regional wall   motion abnormalities. Left ventricular diastolic function   parameters were normal. - Atrial septum: No defect or patent foramen ovale was identified.  EKG:  EKG is not ordered today.   Recent Labs: 03/06/2017: BUN 25; Creatinine, Ser 1.13; Hemoglobin 10.8; Platelets 203; Potassium 3.9; Sodium 131  Recent Lipid Panel    Component Value Date/Time   CHOL 180 10/12/2015 0835   TRIG 138 10/12/2015 0835   HDL 74 10/12/2015 0835   CHOLHDL 2.4 10/12/2015 0835   VLDL 28 10/12/2015 0835   LDLCALC 78 10/12/2015 0835    Physical Exam:    VS:  BP 124/68   Pulse 88   Ht 5' (1.524 m)   Wt 185 lb (83.9 kg)   BMI 36.13 kg/m     Wt Readings from Last 3 Encounters:  11/16/17 185 lb (83.9 kg)  04/04/17 183 lb 12.8 oz (83.4 kg)  03/07/17 182 lb 8 oz (82.8 kg)     GEN: Well nourished, well developed in no acute distress HEENT: Normal NECK: No JVD; No carotid bruits LYMPHATICS: No lymphadenopathy CARDIAC: RRR, 2/6 systolic murmur at the RUSB RESPIRATORY:  Clear to auscultation without rales, wheezing or rhonchi  ABDOMEN: Soft, non-tender, non-distended MUSCULOSKELETAL:  No edema; No deformity  SKIN: Warm and dry NEUROLOGIC:  Alert and oriented x 3 PSYCHIATRIC:  Normal affect   ASSESSMENT:    1. Coronary artery disease involving native coronary artery of native heart without angina pectoris   2. Mixed hyperlipidemia   3. Essential hypertension    PLAN:    In order of problems listed above:  1. The patient is stable without symptoms of angina.  She is on good medical program with aspirin for antiplatelet therapy, a combination of rosuvastatin and ezetimibe for lipid lowering, a beta-blocker, and an ACE inhibitor. 2. Lipids at goal as outlined above.  Labs will  be followed by Dr. Rex Kras. 3. Blood pressure well controlled on current medications.  Lifestyle modification discussed today.    Medication Adjustments/Labs and  Tests Ordered: Current medicines are reviewed at length with the patient today.  Concerns regarding medicines are outlined above.  No orders of the defined types were placed in this encounter.  Meds ordered this encounter  Medications  . nitroGLYCERIN (NITROSTAT) 0.4 MG SL tablet    Sig: Place 1 tablet (0.4 mg total) under the tongue every 5 (five) minutes x 3 doses as needed for chest pain.    Dispense:  25 tablet    Refill:  2    Patient Instructions  Medication Instructions:  Your provider recommends that you continue on your current medications as directed. Please refer to the Current Medication list given to you today.   If you need a refill on your cardiac medications before your next appointment, please call your pharmacy.   Lab work: None ordered  Testing/Procedures: None ordered   Follow-Up: At [facility], you and your health needs are our priority.  As part of our continuing mission to provide you with exceptional heart care, we have created designated Provider Care Teams.  These Care Teams include your primary Cardiologist (physician) and Advanced Practice Providers (APPs -  Physician Assistants and Nurse Practitioners) who all work together to provide you with the care you need, when you need it. You will need a follow up appointment in:  12 months.  Please call our office 2 months in advance to schedule this appointment.  You may see Sherren Mocha, MD or one of the following Advanced Practice Providers on your designated Care Team: Richardson Dopp, PA-C [place], [place] . Daune Perch, NP      Signed, Sherren Mocha, MD  11/16/2017 1:20 PM    [facility]

## 2017-11-16 NOTE — Patient Instructions (Addendum)
Medication Instructions:  Your provider recommends that you continue on your current medications as directed. Please refer to the Current Medication list given to you today.   If you need a refill on your cardiac medications before your next appointment, please call your pharmacy.   Lab work: None ordered  Testing/Procedures: None ordered   Follow-Up: At [facility], you and your health needs are our priority.  As part of our continuing mission to provide you with exceptional heart care, we have created designated Provider Care Teams.  These Care Teams include your primary Cardiologist (physician) and Advanced Practice Providers (APPs -  Physician Assistants and Nurse Practitioners) who all work together to provide you with the care you need, when you need it. You will need a follow up appointment in:  12 months.  Please call our office 2 months in advance to schedule this appointment.  You may see Sherren Mocha, MD or one of the following Advanced Practice Providers on your designated Care Team: Richardson Dopp, PA-C [place], [place] . Daune Perch, NP

## 2017-11-22 ENCOUNTER — Encounter

## 2017-12-03 ENCOUNTER — Other Ambulatory Visit: Payer: Self-pay | Admitting: Cardiovascular Disease

## 2018-03-06 DIAGNOSIS — Z96653 Presence of artificial knee joint, bilateral: Secondary | ICD-10-CM | POA: Diagnosis not present

## 2018-03-06 DIAGNOSIS — Z1231 Encounter for screening mammogram for malignant neoplasm of breast: Secondary | ICD-10-CM | POA: Diagnosis not present

## 2018-03-06 DIAGNOSIS — M8589 Other specified disorders of bone density and structure, multiple sites: Secondary | ICD-10-CM | POA: Diagnosis not present

## 2018-03-06 DIAGNOSIS — Z90710 Acquired absence of both cervix and uterus: Secondary | ICD-10-CM | POA: Diagnosis not present

## 2018-03-06 DIAGNOSIS — Z981 Arthrodesis status: Secondary | ICD-10-CM | POA: Diagnosis not present

## 2018-03-07 ENCOUNTER — Other Ambulatory Visit: Payer: Self-pay | Admitting: Family Medicine

## 2018-03-07 DIAGNOSIS — R1011 Right upper quadrant pain: Secondary | ICD-10-CM

## 2018-03-08 ENCOUNTER — Other Ambulatory Visit: Payer: Self-pay | Admitting: Family Medicine

## 2018-03-08 DIAGNOSIS — R1011 Right upper quadrant pain: Secondary | ICD-10-CM

## 2018-03-11 ENCOUNTER — Ambulatory Visit
Admission: RE | Admit: 2018-03-11 | Discharge: 2018-03-11 | Disposition: A | Discharge status: Home / Self Care | Payer: Medicare Other | Source: Ambulatory Visit | Attending: Family Medicine | Admitting: Family Medicine

## 2018-03-11 DIAGNOSIS — R1011 Right upper quadrant pain: Secondary | ICD-10-CM

## 2018-03-11 DIAGNOSIS — K8020 Calculus of gallbladder without cholecystitis without obstruction: Secondary | ICD-10-CM | POA: Diagnosis not present

## 2018-03-12 DIAGNOSIS — M545 Low back pain: Secondary | ICD-10-CM | POA: Diagnosis not present

## 2018-03-18 ENCOUNTER — Telehealth: Payer: Self-pay | Admitting: *Deleted

## 2018-03-18 ENCOUNTER — Other Ambulatory Visit: Payer: Self-pay | Admitting: General Surgery

## 2018-03-18 DIAGNOSIS — K8020 Calculus of gallbladder without cholecystitis without obstruction: Secondary | ICD-10-CM | POA: Diagnosis not present

## 2018-03-18 DIAGNOSIS — Z9079 Acquired absence of other genital organ(s): Secondary | ICD-10-CM | POA: Diagnosis not present

## 2018-03-18 DIAGNOSIS — Z9049 Acquired absence of other specified parts of digestive tract: Secondary | ICD-10-CM | POA: Diagnosis not present

## 2018-03-18 DIAGNOSIS — Z90710 Acquired absence of both cervix and uterus: Secondary | ICD-10-CM | POA: Diagnosis not present

## 2018-03-18 DIAGNOSIS — Z955 Presence of coronary angioplasty implant and graft: Secondary | ICD-10-CM | POA: Diagnosis not present

## 2018-03-18 DIAGNOSIS — I2510 Atherosclerotic heart disease of native coronary artery without angina pectoris: Secondary | ICD-10-CM | POA: Diagnosis not present

## 2018-03-18 DIAGNOSIS — Z90722 Acquired absence of ovaries, bilateral: Secondary | ICD-10-CM | POA: Diagnosis not present

## 2018-03-18 DIAGNOSIS — Z6835 Body mass index (BMI) 35.0-35.9, adult: Secondary | ICD-10-CM | POA: Diagnosis not present

## 2018-03-18 DIAGNOSIS — I10 Essential (primary) hypertension: Secondary | ICD-10-CM | POA: Diagnosis not present

## 2018-03-18 NOTE — Telephone Encounter (Signed)
   Primary Cardiologist: Sherren Mocha, MD  Chart reviewed as part of pre-operative protocol coverage. Patient was contacted 03/18/2018 in reference to pre-operative risk assessment for pending surgery as outlined below.  Gabrielle [name] was last seen on 11/16/17 by Dr. Burt Knack.  Since that day, Gabrielle [name] has done well. She is getting >4 mets of activity.   Therefore, based on ACC/AHA guidelines, the patient would be at acceptable risk for the planned procedure without further cardiovascular testing.   Dr. Burt Knack, is it okay to hold ASA for 5-7 days?  [place], [place] 03/18/2018, 3:29 PM

## 2018-03-18 NOTE — Telephone Encounter (Signed)
   [facility] Pre-operative Risk Assessment    Request for surgical clearance:  1. What type of surgery is being performed? LAP CHOLE W/IOC   2. When is this surgery scheduled? 04/05/18 OR SOONER  3. What type of clearance is required (medical clearance vs. Pharmacy clearance to hold med vs. Both)? MEDICAL  4. Are there any medications that need to be held prior to surgery and how long?ASA   5. Practice name and name of physician performing surgery? [facility]; DR. Dalbert Batman   6. What is your office phone number [phone]    7.   What is your office fax number [phone]  8.   Anesthesia type (None, local, MAC, general) ? GENERAL    Julaine Hua 03/18/2018, 3:03 PM  _________________________________________________________________   (provider comments below)

## 2018-03-18 NOTE — Telephone Encounter (Signed)
This is fine thanks Vin

## 2018-03-20 NOTE — Pre-Procedure Instructions (Signed)
JOYCE HEITMAN  03/20/2018      [id number] [place], [place] [address] [place] [address] Phone: [phone] Fax: [phone]  [id number] [place], [place] [address] Phone: [phone] Fax: [phone]    Your procedure is scheduled on Fri., Feb. 21, 2020 from 1:00PM-2:30PM  Report to [facility] Entrance "A" at 11:00AM  Call this number if you have problems the morning of surgery:  [phone]   Remember:  Do not eat after midnight on Feb. 20th  You may drink clear liquids until 3 hours (10:00AM) prior to surgery .  Clear liquids allowed are:  Water, Juice (non-citric and without pulp), Carbonated beverages, Clear Tea, Black Coffee only, Plain Jell-O only, Gatorade and Plain Popsicles only    Take these medicines the morning of surgery with A SIP OF WATER: Ezetimibe (ZETIA), Gabapentin (NEURONTIN), Levothyroxine (SYNTHROID, LEVOTHROID),  Metoprolol tartrate (LOPRESSOR), and Omeprazole (PRILOSEC)   If needed: Meclizine (ANTIVERT) and NitroGLYCERIN (NITROSTAT)   Hold Aspirin 5-7 days before surgery  As of today, stop taking all Other Aspirin Products, Vitamins, Fish oils, and Herbal medications. Also stop all NSAIDS i.e. Advil, Ibuprofen, Motrin, Aleve, Anaprox, Naproxen, BC, Goody Powders, and all Supplements. Including: Loratadine-pseudoephedrine (CLARITIN-D 24-HOUR)   Do not wear jewelry, make-up or nail polish.  Do not wear lotions, powders, or perfumes, or deodorant.  Do not shave 48 hours prior to surgery.    Do not bring valuables to the hospital.  [facility] is not responsible for any belongings or valuables.  Contacts, dentures or bridgework may not be worn into surgery.  Leave your suitcase in the car.  After surgery it may be brought to your room.  For patients admitted to the hospital, discharge time will be determined by your  treatment team.  Patients discharged the day of surgery will not be allowed to drive home.   Special instructions:  [facility]- Preparing For Surgery  Before surgery, you can play an important role. Because skin is not sterile, your skin needs to be as free of germs as possible. You can reduce the number of germs on your skin by washing with CHG (chlorahexidine gluconate) Soap before surgery.  CHG is an antiseptic cleaner which kills germs and bonds with the skin to continue killing germs even after washing.    Oral Hygiene is also important to reduce your risk of infection.  Remember - BRUSH YOUR TEETH THE MORNING OF SURGERY WITH YOUR REGULAR TOOTHPASTE  Please do not use if you have an allergy to CHG or antibacterial soaps. If your skin becomes reddened/irritated stop using the CHG.  Do not shave (including legs and underarms) for at least 48 hours prior to first CHG shower. It is OK to shave your face.  Please follow these instructions carefully.   1. Shower the NIGHT BEFORE SURGERY and the MORNING OF SURGERY with CHG.   2. If you chose to wash your hair, wash your hair first as usual with your normal shampoo.  3. After you shampoo, rinse your hair and body thoroughly to remove the shampoo.  4. Use CHG as you would any other liquid soap. You can apply CHG directly to the skin and wash gently with a scrungie or a clean washcloth.   5. Apply the CHG Soap to your body ONLY FROM THE NECK DOWN.  Do not use on open  wounds or open sores. Avoid contact with your eyes, ears, mouth and genitals (private parts). Wash Face and genitals (private parts)  with your normal soap.  6. Wash thoroughly, paying special attention to the area where your surgery will be performed.  7. Thoroughly rinse your body with warm water from the neck down.  8. DO NOT shower/wash with your normal soap after using and rinsing off the CHG Soap.  9. Pat yourself dry with a CLEAN TOWEL.  10. Wear CLEAN PAJAMAS to bed  the night before surgery, wear comfortable clothes the morning of surgery  11. Place CLEAN SHEETS on your bed the night of your first shower and DO NOT SLEEP WITH PETS.  Day of Surgery:  Do not apply any deodorants/lotions.  Please wear clean clothes to the hospital/surgery center.   Remember to brush your teeth WITH YOUR REGULAR TOOTHPASTE.  Please read over the following fact sheets that you were given. Pain Booklet, Coughing and Deep Breathing and Surgical Site Infection Prevention

## 2018-03-20 NOTE — H&P (Signed)
Gabrielle [name] Location: [facility] Patient #: [id number] DOB: [birth year] Married / Language: English / Race: White Female       History of Present Illness       This is a 73 year old woman, referred by Dr. Hulan Fess for surgical management of symptomatic gallstones. Sherren Mocha is her cardiologist. Dr. Penelope Coop is her gastroenterologist. Her husband is with her throughout the encounter.     She has a history of right upper quadrant pain for 6 months. This is intermittent. It can radiate to the back. It is often but not always after meals. Sometimes will last for 5 hours. Also nausea. Has only vomited once or twice. It is getting worse. Liver function tests, lipase and CBC were normal on March 07, 2018. Gallbladder ultrasound on February 10 shows a 1.9 cm gallstone. CBD 2.8 mm. Fatty liver. She says she has a little bit of pain every day      Past history reveals myocardial infarction 2017 with stent placement. On aspirin. Chronic constipation. CKD. Hyperlipidemia. Hypertension. Hypothyroidism. Open appendectomy. TAH and BSO. Bladder suspension. BMI 34. Takes aspirin daily Family history reveals mother had cholecystectomy. She died of other causes. Father died of heart disease Social history reveals she is married. 3 children. Is in [place] 33 years. Quit smoking 30 years ago. 2 alcoholic beverages a day.      I think it is fairly clear that she is symptomatic from her gallstones and cholecystectomy is indicated. She would like to proceed in the near future. She has a trip to [place] scheduled for March 23. We will try to expedite her surgery so she can have at least 2 weeks to recover      We will send a note to Dr. Sherren Mocha for cardiac risk assessment and clearance. Hopefully he will let us stop the aspirin for a few days      She'll be scheduled for laparoscopic cholecystectomy with cholangiogram. I have discussed the indications,  details, techniques, and risk of this surgery in detail with her and her husband. She is aware the risks of bleeding, infection, conversion to laparotomy, port site hernia, common duct stones requiring further intervention, bile leak requiring further intervention, injury to adjacent organs with major reconstructive surgery, diarrhea, and other unforeseen problems. She understands all these issues well. All of her questions are answered. She agrees with this plan   Past Surgical History Appendectomy  Carotid Artery Surgery  Left. Foot Surgery  Left. Hysterectomy (not due to cancer) - Complete  Knee Surgery  Bilateral. Spinal Surgery - Lower Back  Tonsillectomy   Diagnostic Studies History  Colonoscopy  within last year Mammogram  within last year Pap Smear  >5 years ago  Allergies  Penicillins  Allergies Reconciled   Medication History  Benazepril HCl (20MG  Tablet, Oral) Active. Levothyroxine Sodium (75MCG Tablet, Oral) Active. Triamterene-HCTZ (37.5-25MG  Tablet, Oral) Active. Metoprolol Tartrate (25MG  Tablet, Oral) Active. Gabapentin (600MG  Tablet, Oral) Active. Rosuvastatin Calcium (40MG  Tablet, Oral) Active. Nitroglycerin (0.4MG  Tab Sublingual, Sublingual) Active. methylPREDNISolone (4MG  Tab Ther Pack, Oral) Active. Shingrix (50MCG/0.5ML For Suspension, Intramuscular) Active. Klor-Con M10 (10MEQ Tablet ER, Oral) Active. Medications Reconciled  Social History  Alcohol use  Moderate alcohol use. Caffeine use  Coffee, Tea. Tobacco use  Former smoker.  Family History  Alcohol Abuse  Daughter. Cerebrovascular Accident  Brother. Depression  Daughter. Diabetes Mellitus  Brother, Sister. Heart Disease  Brother, Father. Heart disease in female family member before age 96  Hypertension  Sister.  Pregnancy / Birth History  Age at menarche  80 years. Age of menopause  [age] Contraceptive History  Intrauterine device, Oral  contraceptives. Gravida  3 Length (months) of breastfeeding  7-12 Para  3  Other Problems  Arthritis  Back Pain  Gastroesophageal Reflux Disease  High blood pressure  Hypercholesterolemia  Myocardial infarction  Oophorectomy  Bilateral. Thyroid Disease  Vascular Disease     Review of Systems General Present- Night Sweats. Not Present- Appetite Loss, Chills, Fatigue, Fever, Weight Gain and Weight Loss. Skin Not Present- Change in Wart/Mole, Dryness, Hives, Jaundice, New Lesions, Non-Healing Wounds, Rash and Ulcer. HEENT Not Present- Earache, Hearing Loss, Hoarseness, Nose Bleed, Oral Ulcers, Ringing in the Ears, Seasonal Allergies, Sinus Pain, Sore Throat, Visual Disturbances, Wears glasses/contact lenses and Yellow Eyes. Respiratory Not Present- Bloody sputum, Chronic Cough, Difficulty Breathing, Snoring and Wheezing. Breast Not Present- Breast Mass, Breast Pain, Nipple Discharge and Skin Changes. Cardiovascular Not Present- Chest Pain, Difficulty Breathing Lying Down, Leg Cramps, Palpitations, Rapid Heart Rate, Shortness of Breath and Swelling of Extremities. Gastrointestinal Present- Abdominal Pain, Bloating and Excessive gas. Not Present- Bloody Stool, Change in Bowel Habits, Chronic diarrhea, Constipation, Difficulty Swallowing, Gets full quickly at meals, Hemorrhoids, Indigestion, Nausea, Rectal Pain and Vomiting. Female Genitourinary Not Present- Frequency, Nocturia, Painful Urination, Pelvic Pain and Urgency. Musculoskeletal Present- Back Pain, Joint Pain and Joint Stiffness. Not Present- Muscle Pain, Muscle Weakness and Swelling of Extremities. Neurological Present- Numbness. Not Present- Decreased Memory, Fainting, Headaches, Seizures, Tingling, Tremor, Trouble walking and Weakness. Psychiatric Not Present- Anxiety, Bipolar, Change in Sleep Pattern, Depression, Fearful and Frequent crying. Endocrine Not Present- Cold Intolerance, Excessive Hunger, Hair Changes, Heat  Intolerance, Hot flashes and New Diabetes. Hematology Not Present- Blood Thinners, Easy Bruising, Excessive bleeding, Gland problems, HIV and Persistent Infections.  Vitals Weight: 181.2 lb Height: 60in Body Surface Area: 1.79 m Body Mass Index: 35.39 kg/m  Temp.: 97.42F  Pulse: 89 (Regular)  BP: 130/78 (Sitting, Left Arm, Standard)     Physical Exam  General Mental Status-Alert. General Appearance-Consistent with stated age. Hydration-Well hydrated. Voice-Normal.  Head and Neck Head-normocephalic, atraumatic with no lesions or palpable masses. Trachea-midline. Thyroid Gland Characteristics - normal size and consistency.  Eye Eyeball - Bilateral-Extraocular movements intact. Sclera/Conjunctiva - Bilateral-No scleral icterus.  Chest and Lung Exam Chest and lung exam reveals -quiet, even and easy respiratory effort with no use of accessory muscles and on auscultation, normal breath sounds, no adventitious sounds and normal vocal resonance. Inspection Chest Wall - Normal. Back - normal.  Cardiovascular Cardiovascular examination reveals -normal heart sounds, regular rate and rhythm with no murmurs and normal pedal pulses bilaterally.  Abdomen Inspection Inspection of the abdomen reveals - No Hernias. Palpation/Percussion Palpation and Percussion of the abdomen reveal - Soft, Non Tender, No Rebound tenderness, No Rigidity (guarding) and No hepatosplenomegaly. Auscultation Auscultation of the abdomen reveals - Bowel sounds normal. Note: Mild, subjective, right upper quadrant tenderness without guarding or mass. Pfannenstiel incision well healed.   Neurologic Neurologic evaluation reveals -alert and oriented x 3 with no impairment of recent or remote memory. Mental Status-Normal.  Musculoskeletal Normal Exam - Left-Upper Extremity Strength Normal and Lower Extremity Strength Normal. Normal Exam - Right-Upper Extremity Strength  Normal and Lower Extremity Strength Normal.  Lymphatic Head & Neck  General Head & Neck Lymphatics: Bilateral - Description - Normal. Axillary  General Axillary Region: Bilateral - Description - Normal. Tenderness - Non Tender. Femoral & Inguinal  Generalized Femoral & Inguinal Lymphatics: Bilateral - Description - Normal.  Tenderness - Non Tender.    Assessment & Plan  GALLSTONES (K80.20)   You have been having what sounds like classic gallbladder attacks for 6 months This is accelerating calming becoming more frequent, and has been associated with at least one episode of vomiting The lab work is fortunately normal Your ultrasound shows a 1.9 cm gallstone but no sign of any complication I have reviewed the patient information booklets with you and your husband. The symptoms will continue until you have a complication You are advised to have a gallbladder surgery prior to having a complication and you agree  you will be scheduled for laparoscopic cholecystectomy and possible cholangiogram x-ray in the very near future I have discussed the indications, techniques, and risks of this surgery in detail  I would like to have the surgery done 2 weeks before you go to [place] on March 23. We should be able to do this  We would like to stop the aspirin 5 days prior to the surgery, if okay with Dr. Sherren Mocha   CORONARY ARTERY DISEASE, OCCLUSIVE (I25.10) CORONARY STENT PATENT (Z95.5) BMI 35.0-35.9,ADULT (Z68.35) HYPERTENSION, ESSENTIAL, BENIGN (I10) HISTORY OF APPENDECTOMY (Z90.49) HISTORY OF TOTAL ABDOMINAL HYSTERECTOMY AND BILATERAL SALPINGO-OOPHORECTOMY (Z90.710)   Edsel Petrin. Dalbert Batman, M.D., [facility] General and Minimally invasive Surgery Breast and Colorectal Surgery Office:   [phone] Pager:   [phone]

## 2018-03-21 ENCOUNTER — Encounter (HOSPITAL_COMMUNITY): Payer: Self-pay

## 2018-03-21 ENCOUNTER — Encounter (HOSPITAL_COMMUNITY)
Admission: RE | Admit: 2018-03-21 | Discharge: 2018-03-21 | Disposition: A | Discharge status: Home / Self Care | Payer: Medicare Other | Source: Ambulatory Visit | Attending: General Surgery | Admitting: General Surgery

## 2018-03-21 ENCOUNTER — Other Ambulatory Visit: Payer: Self-pay

## 2018-03-21 DIAGNOSIS — I252 Old myocardial infarction: Secondary | ICD-10-CM | POA: Diagnosis not present

## 2018-03-21 DIAGNOSIS — E039 Hypothyroidism, unspecified: Secondary | ICD-10-CM | POA: Diagnosis not present

## 2018-03-21 DIAGNOSIS — Z79899 Other long term (current) drug therapy: Secondary | ICD-10-CM | POA: Diagnosis not present

## 2018-03-21 DIAGNOSIS — K8010 Calculus of gallbladder with chronic cholecystitis without obstruction: Secondary | ICD-10-CM | POA: Diagnosis not present

## 2018-03-21 DIAGNOSIS — Z7952 Long term (current) use of systemic steroids: Secondary | ICD-10-CM | POA: Diagnosis not present

## 2018-03-21 DIAGNOSIS — Z888 Allergy status to other drugs, medicaments and biological substances status: Secondary | ICD-10-CM | POA: Diagnosis not present

## 2018-03-21 DIAGNOSIS — E785 Hyperlipidemia, unspecified: Secondary | ICD-10-CM | POA: Diagnosis not present

## 2018-03-21 DIAGNOSIS — K8020 Calculus of gallbladder without cholecystitis without obstruction: Secondary | ICD-10-CM | POA: Diagnosis present

## 2018-03-21 DIAGNOSIS — Z955 Presence of coronary angioplasty implant and graft: Secondary | ICD-10-CM | POA: Diagnosis not present

## 2018-03-21 DIAGNOSIS — Z01812 Encounter for preprocedural laboratory examination: Secondary | ICD-10-CM

## 2018-03-21 DIAGNOSIS — Z880 Allergy status to penicillin: Secondary | ICD-10-CM | POA: Diagnosis not present

## 2018-03-21 DIAGNOSIS — I129 Hypertensive chronic kidney disease with stage 1 through stage 4 chronic kidney disease, or unspecified chronic kidney disease: Secondary | ICD-10-CM | POA: Diagnosis not present

## 2018-03-21 DIAGNOSIS — N189 Chronic kidney disease, unspecified: Secondary | ICD-10-CM | POA: Diagnosis not present

## 2018-03-21 DIAGNOSIS — Z79890 Hormone replacement therapy (postmenopausal): Secondary | ICD-10-CM | POA: Diagnosis not present

## 2018-03-21 DIAGNOSIS — K219 Gastro-esophageal reflux disease without esophagitis: Secondary | ICD-10-CM | POA: Diagnosis not present

## 2018-03-21 DIAGNOSIS — E7800 Pure hypercholesterolemia, unspecified: Secondary | ICD-10-CM | POA: Diagnosis not present

## 2018-03-21 DIAGNOSIS — Z87891 Personal history of nicotine dependence: Secondary | ICD-10-CM | POA: Diagnosis not present

## 2018-03-21 HISTORY — DX: Anemia, unspecified: D64.9

## 2018-03-21 LAB — COMPREHENSIVE METABOLIC PANEL
ALT: 40 U/L (ref 0–44)
AST: 29 U/L (ref 15–41)
Albumin: 3.9 g/dL (ref 3.5–5.0)
Alkaline Phosphatase: 76 U/L (ref 38–126)
Anion gap: 10 (ref 5–15)
BUN: 20 mg/dL (ref 8–23)
CO2: 24 mmol/L (ref 22–32)
Calcium: 9.6 mg/dL (ref 8.9–10.3)
Chloride: 97 mmol/L — ABNORMAL LOW (ref 98–111)
Creatinine, Ser: 0.92 mg/dL (ref 0.44–1.00)
GFR calc Af Amer: >60 mL/min (ref >60)
GFR calc non Af Amer: >60 mL/min (ref >60)
Glucose, Bld: 119 mg/dL — ABNORMAL HIGH (ref 70–99)
Potassium: 4.3 mmol/L (ref 3.5–5.1)
Sodium: 131 mmol/L — ABNORMAL LOW (ref 135–145)
Total Bilirubin: 0.6 mg/dL (ref 0.3–1.2)
Total Protein: 6.6 g/dL (ref 6.5–8.1)

## 2018-03-21 LAB — CBC WITH DIFFERENTIAL/PLATELET
Abs Immature Granulocytes: 0.02 10*3/uL (ref 0.00–0.07)
Basophils Absolute: 0.1 10*3/uL (ref 0.0–0.1)
Basophils Relative: 1 %
Eosinophils Absolute: 0.3 10*3/uL (ref 0.0–0.5)
Eosinophils Relative: 4 %
HCT: 39.5 % (ref 36.0–46.0)
Hemoglobin: 12.7 g/dL (ref 12.0–15.0)
Immature Granulocytes: 0 %
Lymphocytes Relative: 28 %
Lymphs Abs: 1.9 10*3/uL (ref 0.7–4.0)
MCH: 30.4 pg (ref 26.0–34.0)
MCHC: 32.2 g/dL (ref 30.0–36.0)
MCV: 94.5 fL (ref 80.0–100.0)
Monocytes Absolute: 0.7 10*3/uL (ref 0.1–1.0)
Monocytes Relative: 10 %
Neutro Abs: 4 10*3/uL (ref 1.7–7.7)
Neutrophils Relative %: 57 %
Platelets: 183 10*3/uL (ref 150–400)
RBC: 4.18 MIL/uL (ref 3.87–5.11)
RDW: 13 % (ref 11.5–15.5)
WBC: 7 10*3/uL (ref 4.0–10.5)
nRBC: 0 % (ref 0.0–0.2)

## 2018-03-21 NOTE — Progress Notes (Signed)
Anesthesia Chart Review:  Case:  [id number] Date/Time:  03/22/18 1245   Procedure:  LAPAROSCOPIC CHOLECYSTECTOMY WITH INTRAOPERATIVE CHOLANGIOGRAM (N/A ) - NEEDS RNFA   Anesthesia type:  General   Pre-op diagnosis:  GALLSTONES   Location:  [facility] OR ROOM 08 / [facility]   Surgeon:  Fanny Skates, MD      DISCUSSION: Patient is a 73 year old female scheduled for the above procedure.   History includes former smoker (quit '85), HTN, GERD, hypercholesterolemia, murmur (no significant valvular disease by echo 2017), CAD (inferolateral STEMI 08/21/15, s/p DES OM1 08/21/15 and DES RCA 08/23/15), peripheral neuropathy, hypothyroidism, back surgeries (s/p Gill decompression/discectomy/posterior fusion/posterolateral arthrodesis L4-5 04/20/10; s/p XLIF L3-4 03/07/17). BMI is consistent with obesity.  Cardiology input per Leanor Kail, [place] (reviewed with Dr. Burt Knack as well) on 03/18/18: ".Marland KitchenMarland KitchenTherefore, based on ACC/AHA guidelines, the patient would be at acceptable risk for the planned procedure without further cardiovascular testing." Permission to hold ASA for 5-7 days for surgery(last day 03/16/18).    She denied SOB, chest pain, cough, and fever at PAT. If no acute changes then I anticipate that she can proceed as planned.    VS: BP (!) 128/59   Pulse 76   Temp 36.4 C (Oral)   Resp 17   Ht 5' (1.524 m)   Wt 82.1 kg   SpO2 98%   BMI 35.35 kg/m   PROVIDERS: Hulan Fess, MD is PCP Sherren Mocha, MD is cardiologist. Last visit 11/16/17. One year follow-up recommended.   LABS: Labs reviewed: Acceptable for surgery. (all labs ordered are listed, but only abnormal results are displayed)  Labs Reviewed  COMPREHENSIVE METABOLIC PANEL - Abnormal; Notable for the following components:      Result Value   Sodium 131 (*)    Chloride 97 (*)    Glucose, Bld 119 (*)    All other components within normal limits  CBC WITH DIFFERENTIAL/PLATELET     EKG: 04/04/17 ([facility]): NSR, LAFB. LVH with  repolarization abnormality.    CV: Cardiac cath/PCI 08/21/15 (Dr. Sherren Mocha):   Mid RCA lesion, 85% stenosed.  Ost LM lesion, 40% stenosed.  Prox LAD to Mid LAD lesion, 25% stenosed.  Ost Cx to Prox Cx lesion, 70% stenosed.  There is mild left ventricular systolic dysfunction.  1st Mrg lesion, 100% stenosed. Post intervention, there is a 0% residual stenosis. 1. Acute inferolateral STEMI secondary to total occlusion of the first OM branch. Is is treated successfully with primary PCI (2.5 x 16 mm Promus DES). The proximal circumflex has moderate diffuse disease all the way back to the ostium. This would require a long overlapping stent in a diffusely calcified vessel. I felt it was best to treat this area medically. 2. Mild nonobstructive disease of the left main and LAD 3. Severe stenosis of the mid RCA: Recommend staged PCI prior to discharge 4. Mild segmental contraction abnormality the left ventricle with preserved overall LVEF Recommendations: Aspirin and brilinta 12 months, aggressive medical therapy, staged PCI the right coronary artery on Monday.  PCI 08/23/15 (Dr. Glenetta Hew):   Mid RCA lesion, 85 %stenosed.  A STENT PROMUS PREM MR 2.5X16 drug eluting stent was successfully placed. Post intervention, there is a 0% residual stenosis.  1st Mrg stent appears to be widely patent  Ost Cx to Prox Cx lesion, 70 %stenosed. Successful focal PCI of mid RCA lesion with DES stent. Reviewed with Dr. Burt Knack, we both feel that the osteo-proximal circumflex lesion is a very difficult lesion to  consider PCI as there is really minimal landing zone for stent. This also somewhat calcified and likely chronic. The patient had not been having anginal symptoms prior to coming in, therefore it is unlikely to be flow-limiting. Plan for now is medical management.  Echo 08/23/15: Study Conclusions - Left ventricle: The cavity size was normal. Systolic function was normal. The estimated  ejection fraction was in the range of 55% to 60%. Wall motion was normal; there were no regional wall motion abnormalities. Left ventricular diastolic function parameters were normal. - Atrial septum: No defect or patent foramen ovale was identified.   Past Medical History:  Diagnosis Date  . Anemia   . Arthritis 01-17-11   degenerative disc disease-all joints  . Bursitis   . Cholesterol serum elevated 01-17-11   tx. meds  . GERD (gastroesophageal reflux disease) 01-17-11   tx. omeprazole  . Heart murmur    slight murmur  . High cholesterol   . Hypertension 01-17-11   tx. meds  . Hypothyroidism   . Leg pain    ABIs 2/18: normal bilaterally  . Neuropathy    FEET  . Osteoarthritis   . Peripheral neuropathy   . Seasonal allergies   . Sinus problem 01-17-11   tx. Claritin D  . ST elevation (STEMI) myocardial infarction involving left circumflex coronary artery ([place]) 08/21/2015    Past Surgical History:  Procedure Laterality Date  . ABDOMINAL HYSTERECTOMY  01-17-11   '93-Hysterectomy-heavy bleeding  . ANTERIOR LATERAL LUMBAR FUSION WITH PERCUTANEOUS SCREW 1 LEVEL N/A 03/07/2017   Procedure: XLIF L3-4;  Surgeon: Melina Schools, MD;  Location: [facility];  Service: Orthopedics;  Laterality: N/A;  4 hrs  . APPENDECTOMY    . BACK SURGERY  01-17-11   04-20-10-T-Lift lumbar fusion  . [facility] PROCEDURE  01-17-11   Bladder sling  . CARDIAC CATHETERIZATION N/A 08/21/2015   Procedure: Left Heart Cath and Coronary Angiography;  Surgeon: Sherren Mocha, MD;  Location: [place] [facility];  Service: Cardiovascular;  Laterality: N/A;  . CARDIAC CATHETERIZATION N/A 08/21/2015   Procedure: Coronary Stent Intervention;  Surgeon: Sherren Mocha, MD;  Location: [place] [facility];  Service: Cardiovascular;  Laterality: N/A;  . CARDIAC CATHETERIZATION N/A 08/23/2015   Procedure: Coronary Stent Intervention;  Surgeon: Leonie Man, MD;  Location: [place] [facility];  Service: Cardiovascular;   Laterality: N/A;  promus 2.5x16 in RCA  . CARDIAC CATHETERIZATION N/A 08/23/2015   Procedure: Left Heart Cath and Coronary Angiography;  Surgeon: Leonie Man, MD;  Location: [place] [facility];  Service: Cardiovascular;  Laterality: N/A;  . CARPAL TUNNEL RELEASE  01-17-11   '02-left, also right  . COLONOSCOPY    . HARDWARE REMOVAL Right 05/15/2013   Procedure: REMOVAL RIGHT L4-L5 PEDICLE SCREWS AND ROD ;  Surgeon: Melina Schools, MD;  Location: [place];  Service: Orthopedics;  Laterality: Right;  . JOINT REPLACEMENT  01-17-11    RTKA' 02  . LAPAROSCOPIC APPENDECTOMY N/A 08/13/2013   Procedure: APPENDECTOMY LAPAROSCOPIC;  Surgeon: Pedro Earls, MD;  Location: [facility];  Service: General;  Laterality: N/A;  . TONSILLECTOMY  01-17-11   child  . TOTAL KNEE ARTHROPLASTY  01/19/2011   Procedure: TOTAL KNEE ARTHROPLASTY;  Surgeon: Johnn Hai;  Location: [facility];  Service: Orthopedics;  Laterality: Left;    MEDICATIONS: . aspirin EC 81 MG tablet  . benazepril (LOTENSIN) 20 MG tablet  . Calcium Carbonate-Vitamin D (CALCIUM 600 + D PO)  . Cholecalciferol (VITAMIN D) 2000 units  CAPS  . diphenhydramine-acetaminophen (TYLENOL PM) 25-500 MG TABS tablet  . docusate sodium (COLACE) 100 MG capsule  . ezetimibe (ZETIA) 10 MG tablet  . ferrous sulfate 325 (65 FE) MG tablet  . fish oil-omega-3 fatty acids 1000 MG capsule  . gabapentin (NEURONTIN) 600 MG tablet  . Glucosamine-Chondroit-Vit C-Mn (GLUCOSAMINE CHONDR 1500 COMPLX PO)  . levothyroxine (SYNTHROID, LEVOTHROID) 75 MCG tablet  . Liniments (SALONPAS PAIN RELIEF PATCH EX)  . loratadine-pseudoephedrine (CLARITIN-D 24-HOUR) 10-240 MG per 24 hr tablet  . meclizine (ANTIVERT) 25 MG tablet  . Melatonin 5 MG CAPS  . metoprolol tartrate (LOPRESSOR) 25 MG tablet  . nitroGLYCERIN (NITROSTAT) 0.4 MG SL tablet  . NON FORMULARY  . omeprazole (PRILOSEC) 20 MG capsule  . polyethylene glycol (MIRALAX / GLYCOLAX) packet  . potassium chloride  (K-DUR,KLOR-CON) 10 MEQ tablet  . rosuvastatin (CRESTOR) 40 MG tablet  . triamterene-hydrochlorothiazide (MAXZIDE-25) 37.5-25 MG tablet   No current facility-administered medications for this encounter.   CBD oil 1 drop Q AM   Myra Gianotti, PA-C Surgical Short Stay/Anesthesiology [facility] Phone [phone] [facility] Phone [phone] 03/21/2018 1:40 PM

## 2018-03-21 NOTE — Anesthesia Preprocedure Evaluation (Addendum)
Anesthesia Evaluation  Patient identified by MRN, date of birth, ID band Patient awake    Reviewed: Allergy & Precautions, NPO status , Patient's Chart, lab work & pertinent test results, reviewed documented beta blocker date and time   Airway Mallampati: II  TM Distance: >3 FB Neck ROM: Full    Dental  (+) Dental Advisory Given, Teeth Intact   Pulmonary former smoker,    Pulmonary exam normal breath sounds clear to auscultation       Cardiovascular hypertension, Pt. on home beta blockers and Pt. on medications + CAD and + Past MI  Normal cardiovascular exam+ Valvular Problems/Murmurs  Rhythm:Regular Rate:Normal     Neuro/Psych negative neurological ROS  negative psych ROS   GI/Hepatic Neg liver ROS, GERD  ,  Endo/Other  Hypothyroidism   Renal/GU negative Renal ROS  negative genitourinary   Musculoskeletal  (+) Arthritis ,   Abdominal   Peds negative pediatric ROS (+)  Hematology  (+) Blood dyscrasia, anemia ,   Anesthesia Other Findings   Reproductive/Obstetrics negative OB ROS                                                            Anesthesia Evaluation  Patient identified by MRN, date of birth, ID band Patient awake    Reviewed: Allergy & Precautions, H&P , Patient's Chart, lab work & pertinent test results, reviewed documented beta blocker date and time   Airway Mallampati: II  TM Distance: >3 FB Neck ROM: full    Dental no notable dental hx.    Pulmonary former smoker,    Pulmonary exam normal breath sounds clear to auscultation       Cardiovascular hypertension,  Rhythm:regular Rate:Normal     Neuro/Psych    GI/Hepatic   Endo/Other    Renal/GU      Musculoskeletal   Abdominal   Peds  Hematology   Anesthesia Other Findings Left ventricle: The cavity size was normal. Systolic function was   normal. The estimated ejection fraction was in  the range of 55%   to 60%.   Reproductive/Obstetrics                             Anesthesia Physical Anesthesia Plan  ASA: II  Anesthesia Plan: General   Post-op Pain Management:    Induction: Intravenous  PONV Risk Score and Plan: 2 and Dexamethasone, Ondansetron and Treatment may vary due to age or medical condition  Airway Management Planned: Oral ETT  Additional Equipment:   Intra-op Plan:   Post-operative Plan: Extubation in OR  Informed Consent: I have reviewed the patients History and Physical, chart, labs and discussed the procedure including the risks, benefits and alternatives for the proposed anesthesia with the patient or authorized representative who has indicated his/her understanding and acceptance.   Dental Advisory Given  Plan Discussed with: CRNA and Surgeon  Anesthesia Plan Comments: (  )        Anesthesia Quick Evaluation  Anesthesia Physical Anesthesia Plan  ASA: III  Anesthesia Plan: General   Post-op Pain Management:    Induction: Intravenous  PONV Risk Score and Plan: 4 or greater and Ondansetron, Dexamethasone and Treatment may vary due to age or medical condition  Airway Management Planned: Oral  ETT  Additional Equipment: None  Intra-op Plan:   Post-operative Plan: Extubation in OR  Informed Consent: I have reviewed the patients History and Physical, chart, labs and discussed the procedure including the risks, benefits and alternatives for the proposed anesthesia with the patient or authorized representative who has indicated his/her understanding and acceptance.     Dental advisory given  Plan Discussed with: CRNA  Anesthesia Plan Comments: (PAT note written 03/21/2018 by Myra Gianotti, PA-C. )       Anesthesia Quick Evaluation

## 2018-03-21 NOTE — Progress Notes (Signed)
PCP - Dr. Hulan Fess Cardiologist - Dr. Sherren Mocha  Chest x-ray - N/A EKG - 04/04/17 Stress Test - 5+ years ECHO - 08/23/15 Cardiac Cath - 08/23/15  Sleep Study - denies  Blood Thinner Instructions:N/A Aspirin Instructions: LD 03/16/18  Anesthesia review: Yes, heart history.   Patient denies shortness of breath, fever, cough and chest pain at PAT appointment   Patient verbalized understanding of instructions that were given to them at the PAT appointment. Patient was also instructed that they will need to review over the PAT instructions again at home before surgery.

## 2018-03-22 ENCOUNTER — Ambulatory Visit (HOSPITAL_COMMUNITY): Payer: Medicare Other | Admitting: Vascular Surgery

## 2018-03-22 ENCOUNTER — Ambulatory Visit (HOSPITAL_COMMUNITY)
Admission: RE | Admit: 2018-03-22 | Discharge: 2018-03-22 | Disposition: A | Discharge status: Home / Self Care | Payer: Medicare Other | Attending: General Surgery | Admitting: General Surgery

## 2018-03-22 ENCOUNTER — Other Ambulatory Visit: Payer: Self-pay

## 2018-03-22 ENCOUNTER — Ambulatory Visit (HOSPITAL_COMMUNITY): Payer: Medicare Other | Admitting: Registered Nurse

## 2018-03-22 ENCOUNTER — Encounter (HOSPITAL_COMMUNITY): Payer: Self-pay | Admitting: Registered Nurse

## 2018-03-22 ENCOUNTER — Encounter (HOSPITAL_COMMUNITY)
Admission: RE | Disposition: A | Discharge status: Home / Self Care | Payer: Self-pay | Source: Home / Self Care | Attending: General Surgery

## 2018-03-22 DIAGNOSIS — E039 Hypothyroidism, unspecified: Secondary | ICD-10-CM | POA: Diagnosis not present

## 2018-03-22 DIAGNOSIS — E7800 Pure hypercholesterolemia, unspecified: Secondary | ICD-10-CM | POA: Diagnosis not present

## 2018-03-22 DIAGNOSIS — I129 Hypertensive chronic kidney disease with stage 1 through stage 4 chronic kidney disease, or unspecified chronic kidney disease: Secondary | ICD-10-CM | POA: Insufficient documentation

## 2018-03-22 DIAGNOSIS — Z79890 Hormone replacement therapy (postmenopausal): Secondary | ICD-10-CM | POA: Diagnosis not present

## 2018-03-22 DIAGNOSIS — Z880 Allergy status to penicillin: Secondary | ICD-10-CM | POA: Diagnosis not present

## 2018-03-22 DIAGNOSIS — K8010 Calculus of gallbladder with chronic cholecystitis without obstruction: Secondary | ICD-10-CM | POA: Diagnosis not present

## 2018-03-22 DIAGNOSIS — I10 Essential (primary) hypertension: Secondary | ICD-10-CM | POA: Diagnosis not present

## 2018-03-22 DIAGNOSIS — I252 Old myocardial infarction: Secondary | ICD-10-CM | POA: Diagnosis not present

## 2018-03-22 DIAGNOSIS — Z79899 Other long term (current) drug therapy: Secondary | ICD-10-CM | POA: Insufficient documentation

## 2018-03-22 DIAGNOSIS — Z87891 Personal history of nicotine dependence: Secondary | ICD-10-CM | POA: Insufficient documentation

## 2018-03-22 DIAGNOSIS — Z888 Allergy status to other drugs, medicaments and biological substances status: Secondary | ICD-10-CM | POA: Insufficient documentation

## 2018-03-22 DIAGNOSIS — N189 Chronic kidney disease, unspecified: Secondary | ICD-10-CM | POA: Diagnosis not present

## 2018-03-22 DIAGNOSIS — Z955 Presence of coronary angioplasty implant and graft: Secondary | ICD-10-CM | POA: Insufficient documentation

## 2018-03-22 DIAGNOSIS — Z7952 Long term (current) use of systemic steroids: Secondary | ICD-10-CM | POA: Insufficient documentation

## 2018-03-22 DIAGNOSIS — E785 Hyperlipidemia, unspecified: Secondary | ICD-10-CM | POA: Insufficient documentation

## 2018-03-22 DIAGNOSIS — K219 Gastro-esophageal reflux disease without esophagitis: Secondary | ICD-10-CM | POA: Insufficient documentation

## 2018-03-22 DIAGNOSIS — K8020 Calculus of gallbladder without cholecystitis without obstruction: Secondary | ICD-10-CM | POA: Diagnosis not present

## 2018-03-22 DIAGNOSIS — I2510 Atherosclerotic heart disease of native coronary artery without angina pectoris: Secondary | ICD-10-CM | POA: Diagnosis not present

## 2018-03-22 HISTORY — DX: Calculus of gallbladder with chronic cholecystitis without obstruction: K80.10

## 2018-03-22 HISTORY — PX: CHOLECYSTECTOMY: SHX55

## 2018-03-22 SURGERY — LAPAROSCOPIC CHOLECYSTECTOMY
Anesthesia: General | Site: Abdomen

## 2018-03-22 MED ORDER — HYDROMORPHONE HCL 1 MG/ML IJ SOLN
INTRAMUSCULAR | Status: AC
  Filled 2018-03-22: qty 0.5

## 2018-03-22 MED ORDER — LACTATED RINGERS IV SOLN: INTRAVENOUS | Status: DC

## 2018-03-22 MED ORDER — PROPOFOL 10 MG/ML IV BOLUS
INTRAVENOUS | Status: AC
  Filled 2018-03-22: qty 20

## 2018-03-22 MED ORDER — LACTATED RINGERS IV SOLN
INTRAVENOUS | Status: DC
  Administered 2018-03-22 (×2): via INTRAVENOUS

## 2018-03-22 MED ORDER — ACETAMINOPHEN 650 MG RE SUPP: 650.0000 mg | RECTAL | Status: DC | PRN

## 2018-03-22 MED ORDER — CEFAZOLIN SODIUM-DEXTROSE 2-4 GM/100ML-% IV SOLN
2.0000 g | INTRAVENOUS | Status: AC
  Administered 2018-03-22: 2 g via INTRAVENOUS

## 2018-03-22 MED ORDER — ACETAMINOPHEN 325 MG PO TABS: 650.0000 mg | ORAL_TABLET | ORAL | Status: DC | PRN

## 2018-03-22 MED ORDER — CHLORHEXIDINE GLUCONATE CLOTH 2 % EX PADS: 6.0000 | MEDICATED_PAD | Freq: Once | CUTANEOUS | Status: DC

## 2018-03-22 MED ORDER — FENTANYL CITRATE (PF) 250 MCG/5ML IJ SOLN
INTRAMUSCULAR | Status: DC | PRN
  Administered 2018-03-22 (×2): 50 ug via INTRAVENOUS
  Administered 2018-03-22: 100 ug via INTRAVENOUS
  Administered 2018-03-22: 50 ug via INTRAVENOUS

## 2018-03-22 MED ORDER — HYDROMORPHONE HCL 1 MG/ML IJ SOLN: 0.2500 mg | INTRAMUSCULAR | Status: DC | PRN

## 2018-03-22 MED ORDER — ONDANSETRON HCL 4 MG/2ML IJ SOLN
INTRAMUSCULAR | Status: DC | PRN
  Administered 2018-03-22: 4 mg via INTRAVENOUS

## 2018-03-22 MED ORDER — ACETAMINOPHEN 500 MG PO TABS
1000.0000 mg | ORAL_TABLET | ORAL | Status: AC
  Administered 2018-03-22: 1000 mg via ORAL

## 2018-03-22 MED ORDER — OXYCODONE HCL 5 MG PO TABS
5.0000 mg | ORAL_TABLET | Freq: Once | ORAL | Status: AC | PRN
  Administered 2018-03-22: 5 mg via ORAL

## 2018-03-22 MED ORDER — ACETAMINOPHEN 500 MG PO TABS
ORAL_TABLET | ORAL | Status: AC
  Filled 2018-03-22: qty 2

## 2018-03-22 MED ORDER — GABAPENTIN 300 MG PO CAPS: 300.0000 mg | ORAL_CAPSULE | ORAL | Status: DC

## 2018-03-22 MED ORDER — DEXAMETHASONE SODIUM PHOSPHATE 10 MG/ML IJ SOLN
INTRAMUSCULAR | Status: DC | PRN
  Administered 2018-03-22: 10 mg via INTRAVENOUS

## 2018-03-22 MED ORDER — ROCURONIUM BROMIDE 10 MG/ML (PF) SYRINGE
PREFILLED_SYRINGE | INTRAVENOUS | Status: DC | PRN
  Administered 2018-03-22: 40 mg via INTRAVENOUS
  Administered 2018-03-22: 10 mg via INTRAVENOUS

## 2018-03-22 MED ORDER — EPHEDRINE 5 MG/ML INJ
INTRAVENOUS | Status: AC
  Filled 2018-03-22: qty 10

## 2018-03-22 MED ORDER — BUPIVACAINE HCL (PF) 0.25 % IJ SOLN
INTRAMUSCULAR | Status: AC
  Filled 2018-03-22: qty 30

## 2018-03-22 MED ORDER — HYDROCODONE-ACETAMINOPHEN 5-325 MG PO TABS: 1.0000 | ORAL_TABLET | Freq: Four times a day (QID) | ORAL | 0 refills | Status: DC | PRN

## 2018-03-22 MED ORDER — ACETAMINOPHEN 500 MG PO TABS: 1000.0000 mg | ORAL_TABLET | Freq: Four times a day (QID) | ORAL | Status: DC

## 2018-03-22 MED ORDER — FENTANYL CITRATE (PF) 100 MCG/2ML IJ SOLN
INTRAMUSCULAR | Status: AC
  Filled 2018-03-22: qty 2

## 2018-03-22 MED ORDER — PHENYLEPHRINE 40 MCG/ML (10ML) SYRINGE FOR IV PUSH (FOR BLOOD PRESSURE SUPPORT)
PREFILLED_SYRINGE | INTRAVENOUS | Status: AC
  Filled 2018-03-22: qty 10

## 2018-03-22 MED ORDER — BUPIVACAINE HCL 0.25 % IJ SOLN
INTRAMUSCULAR | Status: DC | PRN
  Administered 2018-03-22: 12 mL

## 2018-03-22 MED ORDER — SODIUM CHLORIDE 0.9% FLUSH: 3.0000 mL | INTRAVENOUS | Status: DC | PRN

## 2018-03-22 MED ORDER — SUGAMMADEX SODIUM 200 MG/2ML IV SOLN
INTRAVENOUS | Status: DC | PRN
  Administered 2018-03-22: 200 mg via INTRAVENOUS

## 2018-03-22 MED ORDER — FENTANYL CITRATE (PF) 250 MCG/5ML IJ SOLN
INTRAMUSCULAR | Status: AC
  Filled 2018-03-22: qty 5

## 2018-03-22 MED ORDER — PHENYLEPHRINE HCL 10 MG/ML IJ SOLN
INTRAMUSCULAR | Status: DC | PRN
  Administered 2018-03-22 (×2): 80 ug via INTRAVENOUS

## 2018-03-22 MED ORDER — PROMETHAZINE HCL 25 MG/ML IJ SOLN: 6.2500 mg | INTRAMUSCULAR | Status: DC | PRN

## 2018-03-22 MED ORDER — SODIUM CHLORIDE 0.9 % IR SOLN
Status: DC | PRN
  Administered 2018-03-22: 1

## 2018-03-22 MED ORDER — CEFAZOLIN SODIUM-DEXTROSE 2-4 GM/100ML-% IV SOLN
INTRAVENOUS | Status: AC
  Filled 2018-03-22: qty 100

## 2018-03-22 MED ORDER — OXYCODONE HCL 5 MG/5ML PO SOLN: 5.0000 mg | Freq: Once | ORAL | Status: AC | PRN

## 2018-03-22 MED ORDER — LIDOCAINE 2% (20 MG/ML) 5 ML SYRINGE
INTRAMUSCULAR | Status: DC | PRN
  Administered 2018-03-22: 100 mg via INTRAVENOUS

## 2018-03-22 MED ORDER — 0.9 % SODIUM CHLORIDE (POUR BTL) OPTIME
TOPICAL | Status: DC | PRN
  Administered 2018-03-22: 1000 mL

## 2018-03-22 MED ORDER — SODIUM CHLORIDE 0.9 % IV SOLN: 250.0000 mL | INTRAVENOUS | Status: DC | PRN

## 2018-03-22 MED ORDER — SODIUM CHLORIDE 0.9% FLUSH: 3.0000 mL | Freq: Two times a day (BID) | INTRAVENOUS | Status: DC

## 2018-03-22 MED ORDER — PROPOFOL 10 MG/ML IV BOLUS
INTRAVENOUS | Status: DC | PRN
  Administered 2018-03-22: 150 mg via INTRAVENOUS

## 2018-03-22 MED ORDER — HYDROMORPHONE HCL 1 MG/ML IJ SOLN
INTRAMUSCULAR | Status: DC | PRN
  Administered 2018-03-22 (×2): 0.5 mg via INTRAVENOUS

## 2018-03-22 MED ORDER — GABAPENTIN 300 MG PO CAPS
ORAL_CAPSULE | ORAL | Status: AC
  Filled 2018-03-22: qty 1

## 2018-03-22 MED ORDER — OXYCODONE HCL 5 MG PO TABS
ORAL_TABLET | ORAL | Status: AC
  Filled 2018-03-22: qty 1

## 2018-03-22 MED ORDER — OXYCODONE HCL 5 MG PO TABS: 5.0000 mg | ORAL_TABLET | ORAL | Status: DC | PRN

## 2018-03-22 MED ORDER — FENTANYL CITRATE (PF) 100 MCG/2ML IJ SOLN
25.0000 ug | INTRAMUSCULAR | Status: DC | PRN
  Administered 2018-03-22 (×3): 25 ug via INTRAVENOUS

## 2018-03-22 MED ORDER — IOPAMIDOL (ISOVUE-300) INJECTION 61%
INTRAVENOUS | Status: AC
  Filled 2018-03-22: qty 50

## 2018-03-22 MED ORDER — FENTANYL CITRATE (PF) 100 MCG/2ML IJ SOLN
25.0000 ug | INTRAMUSCULAR | Status: DC | PRN
  Administered 2018-03-22: 25 ug via INTRAVENOUS

## 2018-03-22 SURGICAL SUPPLY — 39 items
APPLIER CLIP ROT 10 11.4 M/L (STAPLE) ×4
BLADE CLIPPER SURG (BLADE) IMPLANT
CANISTER SUCT 3000ML PPV (MISCELLANEOUS) ×4 IMPLANT
CHLORAPREP W/TINT 26ML (MISCELLANEOUS) ×4 IMPLANT
CLIP APPLIE ROT 10 11.4 M/L (STAPLE) ×2 IMPLANT
COVER MAYO STAND STRL (DRAPES) ×4 IMPLANT
COVER SURGICAL LIGHT HANDLE (MISCELLANEOUS) ×4 IMPLANT
COVER WAND RF STERILE (DRAPES) ×4 IMPLANT
DERMABOND ADVANCED (GAUZE/BANDAGES/DRESSINGS) ×2
DERMABOND ADVANCED .7 DNX12 (GAUZE/BANDAGES/DRESSINGS) ×2 IMPLANT
DRAPE C-ARM 42X72 X-RAY (DRAPES) ×4 IMPLANT
ELECT REM PT RETURN 9FT ADLT (ELECTROSURGICAL) ×4
ELECTRODE REM PT RTRN 9FT ADLT (ELECTROSURGICAL) ×2 IMPLANT
GLOVE EUDERMIC 7 POWDERFREE (GLOVE) ×4 IMPLANT
GOWN STRL REUS W/ TWL LRG LVL3 (GOWN DISPOSABLE) ×4 IMPLANT
GOWN STRL REUS W/ TWL XL LVL3 (GOWN DISPOSABLE) ×2 IMPLANT
GOWN STRL REUS W/TWL LRG LVL3 (GOWN DISPOSABLE) ×4
GOWN STRL REUS W/TWL XL LVL3 (GOWN DISPOSABLE) ×2
KIT BASIN OR (CUSTOM PROCEDURE TRAY) ×4 IMPLANT
KIT TURNOVER KIT B (KITS) ×4 IMPLANT
NS IRRIG 1000ML POUR BTL (IV SOLUTION) ×4 IMPLANT
PAD ARMBOARD 7.5X6 YLW CONV (MISCELLANEOUS) ×4 IMPLANT
POUCH RETRIEVAL ECOSAC 10 (ENDOMECHANICALS) IMPLANT
POUCH RETRIEVAL ECOSAC 10MM (ENDOMECHANICALS)
POUCH SPECIMEN RETRIEVAL 10MM (ENDOMECHANICALS) IMPLANT
SCISSORS LAP 5X35 DISP (ENDOMECHANICALS) ×4 IMPLANT
SET CHOLANGIOGRAPH 5 50 .035 (SET/KITS/TRAYS/PACK) ×4 IMPLANT
SET IRRIG TUBING LAPAROSCOPIC (IRRIGATION / IRRIGATOR) ×4 IMPLANT
SET TUBE SMOKE EVAC HIGH FLOW (TUBING) ×4 IMPLANT
SLEEVE ENDOPATH XCEL 5M (ENDOMECHANICALS) ×4 IMPLANT
SPECIMEN JAR SMALL (MISCELLANEOUS) ×4 IMPLANT
SUT MNCRL AB 4-0 PS2 18 (SUTURE) ×4 IMPLANT
TOWEL OR 17X24 6PK STRL BLUE (TOWEL DISPOSABLE) ×4 IMPLANT
TOWEL OR 17X26 10 PK STRL BLUE (TOWEL DISPOSABLE) ×4 IMPLANT
TRAY LAPAROSCOPIC MC (CUSTOM PROCEDURE TRAY) ×4 IMPLANT
TROCAR XCEL BLUNT TIP 100MML (ENDOMECHANICALS) ×4 IMPLANT
TROCAR XCEL NON-BLD 11X100MML (ENDOMECHANICALS) ×4 IMPLANT
TROCAR XCEL NON-BLD 5MMX100MML (ENDOMECHANICALS) ×4 IMPLANT
WATER STERILE IRR 1000ML POUR (IV SOLUTION) ×4 IMPLANT

## 2018-03-22 NOTE — Transfer of Care (Signed)
Immediate Anesthesia Transfer of Care Note  Patient: Gabrielle [name]  Procedure(s) Performed: LAPAROSCOPIC CHOLECYSTECTOMY (N/A Abdomen)  Patient Location: PACU  Anesthesia Type:General  Level of Consciousness: awake, alert  and oriented  Airway & Oxygen Therapy: Patient Spontanous Breathing and Patient connected to face mask oxygen  Post-op Assessment: Report given to RN and Post -op Vital signs reviewed and stable  Post vital signs: Reviewed and stable  Last Vitals:  Vitals Value Taken Time  BP 127/73 03/22/2018  2:17 PM  Temp    Pulse 73 03/22/2018  2:18 PM  Resp 11 03/22/2018  2:18 PM  SpO2 100 % 03/22/2018  2:18 PM  Vitals shown include unvalidated device data.  Last Pain:  Vitals:   03/22/18 1129  TempSrc:   PainSc: 0-No pain      Patients Stated Pain Goal: 6 (51/88/41 [id number])  Complications: No apparent anesthesia complications

## 2018-03-22 NOTE — Anesthesia Procedure Notes (Signed)
Procedure Name: Intubation Date/Time: 03/22/2018 12:56 PM Performed by: Elayne Snare, CRNA Pre-anesthesia Checklist: Patient identified, Emergency Drugs available, Suction available and Patient being monitored Patient Re-evaluated:Patient Re-evaluated prior to induction Oxygen Delivery Method: Circle System Utilized Preoxygenation: Pre-oxygenation with 100% oxygen Induction Type: IV induction Ventilation: Mask ventilation without difficulty Laryngoscope Size: Mac and 4 Grade View: Grade I Tube type: Oral Tube size: 7.0 mm Number of attempts: 1 Airway Equipment and Method: Stylet and Oral airway Placement Confirmation: ETT inserted through vocal cords under direct vision,  positive ETCO2 and breath sounds checked- equal and bilateral Secured at: 21 cm Tube secured with: Tape Dental Injury: Teeth and Oropharynx as per pre-operative assessment

## 2018-03-22 NOTE — Op Note (Signed)
Patient Name:           SCHERYL SANBORN   Date of Surgery:        03/22/2018  Pre op Diagnosis:      Chronic cholecystitis with cholelithiasis  Post op Diagnosis:    Same  Procedure:                 Laparoscopic cholecystectomy  Surgeon:                     Edsel Petrin. Dalbert Batman, M.D., FACS  Assistant:                      OR staff  Operative Indications:     [name] Location: [facility] Patient #: [id number] DOB: 1945/07/08 Married / Language: English / Race: White Female       History of Present Illness       This is a 73 year old woman, referred by Dr. Hulan Fess for surgical management of symptomatic gallstones. Sherren Mocha is her cardiologist. Dr. Penelope Coop is her gastroenterologist.      She has a history of right upper quadrant pain for 6 months. This is intermittent. It can radiate to the back. It is often but not always after meals. Sometimes will last for 5 hours. Also nausea. Has only vomited once or twice. It is getting worse. Liver function tests, lipase and CBC were normal on March 07, 2018. Gallbladder ultrasound on February 10 shows a 1.9 cm gallstone. CBD 2.8 mm. Fatty liver. She says she has a little bit of pain every day      Past history reveals myocardial infarction 2017 with stent placement. On aspirin. Chronic constipation. CKD. Hyperlipidemia. Hypertension. Hypothyroidism. Open appendectomy. TAH and BSO. Bladder suspension. BMI 34. Takes aspirin daily.      I think it is fairly clear that she is symptomatic from her gallstones and cholecystectomy is indicated. She would like to proceed in the near future.      We will send a note to Dr. Sherren Mocha for cardiac risk assessment and clearance. Hopefully he will let us stop the aspirin for a few days      She'll be scheduled for laparoscopic cholecystectomy with possible cholangiogram.  Operative Findings:       The gallbladder was chronically inflamed  somewhat discolored but was thin-walled.  Two thirds of the gallbladder was covered by chronic adhesions and omentum.  It contained a single large gallstone.  The anatomy of the cystic duct cystic artery and common bile duct appeared conventional.  The cystic duct was very small four-quadrant inspection revealed no other disease process or injury to intra-abdominal viscera or peritoneal surface.  Procedure in Detail:          Following the induction of general endotracheal anesthesia the patient's abdomen was prepped and draped in a sterile fashion.  Surgical timeout was performed.  Intravenous antibiotics were given.  0.5% Marcaine was used as a local infiltration anesthetic.      A vertical incision was made in the lower rim of the umbilicus.  The fascia was incised in the midline and the abdominal cavity entered under direct vision.  An 11 mm Hassan trocar was inserted and secured with a pursestring suture of 0 Vicryl.  Pneumoperitoneum was created and video camera was inserted.  A trocar was placed in the subxiphoid region and two 5 mm trochars placed in the right upper quadrant.  The  gallbladder fundus was visualized and was elevated.  We slowly took all the chronic adhesions down.  We had good view of the duodenum and there was no evidence of any injury.  The infundibulum was retracted.  We dissected out the cystic duct and the cystic artery.  We controlled the cystic artery with metal clips and divided it as it went on to the bed of the gallbladder.  We created a large window and critical view behind the cystic duct.  We decided to simply clip the cystic duct with multiple clips and divide it..  The gallbladder was dissected from its bed with electrocautery, placed in a specimen bag and removed.  The operative field in the subphrenic space was copiously irrigated with saline until the irrigation fluid was completely clear.  The clips were in place and there was no evidence of bleeding or bile leak.   Trochars were removed.  Pneumoperitoneum was released.  The fascia at the umbilicus was closed with 0 Vicryl sutures and the skin incisions were closed with subcuticular 4-0 Monocryl and Dermabond.  The patient tolerated the procedure well was taken to PACU in stable condition.  EBL 10 cc.  Counts correct.  Complications none.    Addendum: I logged onto the PMP aware website and reviewed her prescription medication history breast mass     Alphia Behanna M. Dalbert Batman, M.D., FACS General and Minimally Invasive Surgery Breast and Colorectal Surgery  03/22/2018 2:07 PM

## 2018-03-22 NOTE — Interval H&P Note (Signed)
History and Physical Interval Note:  03/22/2018 12:17 PM  Gabrielle [name]  has presented today for surgery, with the diagnosis of GALLSTONES  The various methods of treatment have been discussed with the patient and family. After consideration of risks, benefits and other options for treatment, the patient has consented to  Procedure(s) with comments: LAPAROSCOPIC CHOLECYSTECTOMY WITH INTRAOPERATIVE CHOLANGIOGRAM (N/A) - NEEDS RNFA as a surgical intervention .  The patient's history has been reviewed, patient examined, no change in status, stable for surgery.  I have reviewed the patient's chart and labs.  Questions were answered to the patient's satisfaction.     Adin Hector

## 2018-03-22 NOTE — Discharge Instructions (Signed)
[facility] ______[facility] °LAPAROSCOPIC SURGERY: POST OP INSTRUCTIONS °Always review your discharge instruction sheet given to you by the facility where your surgery was performed. °IF YOU HAVE DISABILITY OR FAMILY LEAVE FORMS, YOU MUST BRING THEM TO THE OFFICE FOR PROCESSING.   °DO NOT GIVE THEM TO YOUR DOCTOR. ° °1. A prescription for pain medication may be given to you upon discharge.  Take your pain medication as prescribed, if needed.  If narcotic pain medicine is not needed, then you may take acetaminophen (Tylenol) or ibuprofen (Advil) as needed. °2. Take your usually prescribed medications unless otherwise directed. °3. If you need a refill on your pain medication, please contact your pharmacy.  They will contact our office to request authorization. Prescriptions will not be filled after 5pm or on week-ends. °4. You should follow a light diet the first few days after arrival home, such as soup and crackers, etc.  Be sure to include lots of fluids daily. °5. Most patients will experience some swelling and bruising in the area of the incisions.  Ice packs will help.  Swelling and bruising can take several days to resolve.  °6. It is common to experience some constipation if taking pain medication after surgery.  Increasing fluid intake and taking a stool softener (such as Colace) will usually help or prevent this problem from occurring.  A mild laxative (Milk of Magnesia or Miralax) should be taken according to package instructions if there are no bowel movements after 48 hours. °7. Unless discharge instructions indicate otherwise, you may remove your bandages 24-48 hours after surgery, and you may shower at that time.  You may have steri-strips (small skin tapes) in place directly over the incision.  These strips should be left on the skin for 7-10 days.  If your surgeon used skin glue on the incision, you may shower in 24 hours.  The glue will flake off over the next 2-3 weeks.  Any sutures or  staples will be removed at the office during your follow-up visit. °8. ACTIVITIES:  You may resume regular (light) daily activities beginning the next day--such as daily self-care, walking, climbing stairs--gradually increasing activities as tolerated.  You may have sexual intercourse when it is comfortable.  Refrain from any heavy lifting or straining until approved by your doctor. °a. You may drive when you are no longer taking prescription pain medication, you can comfortably wear a seatbelt, and you can safely maneuver your car and apply brakes. °b. RETURN TO WORK:  __________________________________________________________ °9. You should see your doctor in the office for a follow-up appointment approximately 2-3 weeks after your surgery.  Make sure that you call for this appointment within a day or two after you arrive home to insure a convenient appointment time. °10. OTHER INSTRUCTIONS: __________________________________________________________________________________________________________________________ __________________________________________________________________________________________________________________________ °WHEN TO CALL YOUR DOCTOR: °1. Fever over 101.0 °2. Inability to urinate °3. Continued bleeding from incision. °4. Increased pain, redness, or drainage from the incision. °5. Increasing abdominal pain ° °The clinic staff is available to answer your questions during regular business hours.  Please don’t hesitate to call and ask to speak to one of the nurses for clinical concerns.  If you have a medical emergency, go to the nearest emergency room or call 911.  A surgeon from [facility] is always on call at the hospital. °[address], [address], [place], [place]  [address] ? [address] [address], [place],    [address] °[phone] ? [phone] ? FAX [phone] °Web site:   [email or web address] ° °••••••••• ° ° °Managing Your Pain After Surgery Without  Opioids ° ° ° °Thank you for participating in our program to help patients manage their pain after surgery without opioids. This is part of our effort to provide you with the best care possible, without exposing you or your family to the risk that opioids pose. ° °What pain can I expect after surgery? °You can expect to have some pain after surgery. This is normal. The pain is typically worse the day after surgery, and quickly begins to get better. °Many studies have found that many patients are able to manage their pain after surgery with Over-the-Counter (OTC) medications such as Tylenol and Motrin. If you have a condition that does not allow you to take Tylenol or Motrin, notify your surgical team. ° °How will I manage my pain? °The best strategy for controlling your pain after surgery is around the clock pain control with Tylenol (acetaminophen) and Motrin (ibuprofen or Advil). Alternating these medications with each other allows you to maximize your pain control. In addition to Tylenol and Motrin, you can use heating pads or ice packs on your incisions to help reduce your pain. ° °How will I alternate your regular strength over-the-counter pain medication? °You will take a dose of pain medication every three hours. °; Start by taking 650 mg of Tylenol (2 pills of 325 mg) °; 3 hours later take 600 mg of Motrin (3 pills of 200 mg) °; 3 hours after taking the Motrin take 650 mg of Tylenol °; 3 hours after that take 600 mg of Motrin. ° ° °- 1 - ° °See example - if your first dose of Tylenol is at 12:00 PM ° ° °12:00 PM Tylenol 650 mg (2 pills of 325 mg)  °3:00 PM Motrin 600 mg (3 pills of 200 mg)  °6:00 PM Tylenol 650 mg (2 pills of 325 mg)  °9:00 PM Motrin 600 mg (3 pills of 200 mg)  °Continue alternating every 3 hours  ° °We recommend that you follow this schedule around-the-clock for at least 3 days after surgery, or until you feel that it is no longer needed. Use the table on the last page of this handout to  keep track of the medications you are taking. °Important: °Do not take more than 3000mg of Tylenol or 3200mg of Motrin in a 24-hour period. °Do not take ibuprofen/Motrin if you have a history of bleeding stomach ulcers, severe kidney disease, &/or actively taking a blood thinner ° °What if I still have pain? °If you have pain that is not controlled with the over-the-counter pain medications (Tylenol and Motrin or Advil) you might have what we call “breakthrough” pain. You will receive a prescription for a small amount of an opioid pain medication such as Oxycodone, Tramadol, or Tylenol with Codeine. Use these opioid pills in the first 24 hours after surgery if you have breakthrough pain. Do not take more than 1 pill every 4-6 hours. ° °If you still have uncontrolled pain after using all opioid pills, don't hesitate to call our staff using the number provided. We will help make sure you are managing your pain in the best way possible, and if necessary, we can provide a prescription for additional pain medication. ° ° °Day 1   ° °Time  °Name of Medication Number of pills taken  °Amount of Acetaminophen  °Pain Level  ° °Comments  °AM PM       °AM PM       °  AM PM       °AM PM       °AM PM       °AM PM       °AM PM       °AM PM       °Total Daily amount of Acetaminophen °Do not take more than  3,000 mg per day    ° ° °Day 2   ° °Time  °Name of Medication Number of pills °taken  °Amount of Acetaminophen  °Pain Level  ° °Comments  °AM PM       °AM PM       °AM PM       °AM PM       °AM PM       °AM PM       °AM PM       °AM PM       °Total Daily amount of Acetaminophen °Do not take more than  3,000 mg per day    ° ° °Day 3   ° °Time  °Name of Medication Number of pills taken  °Amount of Acetaminophen  °Pain Level  ° °Comments  °AM PM       °AM PM       °AM PM       °AM PM       ° ° ° °AM PM       °AM PM       °AM PM       °AM PM       °Total Daily amount of Acetaminophen °Do not take more than  3,000 mg per day    ° ° °Day  4   ° °Time  °Name of Medication Number of pills taken  °Amount of Acetaminophen  °Pain Level  ° °Comments  °AM PM       °AM PM       °AM PM       °AM PM       °AM PM       °AM PM       °AM PM       °AM PM       °Total Daily amount of Acetaminophen °Do not take more than  3,000 mg per day    ° ° °Day 5   ° °Time  °Name of Medication Number °of pills taken  °Amount of Acetaminophen  °Pain Level  ° °Comments  °AM PM       °AM PM       °AM PM       °AM PM       °AM PM       °AM PM       °AM PM       °AM PM       °Total Daily amount of Acetaminophen °Do not take more than  3,000 mg per day    ° ° ° °Day 6   ° °Time  °Name of Medication Number of pills °taken  °Amount of Acetaminophen  °Pain Level  °Comments  °AM PM       °AM PM       °AM PM       °AM PM       °AM PM       °AM PM       °AM PM       °AM PM       °Total Daily amount   of Acetaminophen °Do not take more than  3,000 mg per day    ° ° °Day 7   ° °Time  °Name of Medication Number of pills taken  °Amount of Acetaminophen  °Pain Level  ° °Comments  °AM PM       °AM PM       °AM PM       °AM PM       °AM PM       °AM PM       °AM PM       °AM PM       °Total Daily amount of Acetaminophen °Do not take more than  3,000 mg per day    ° ° ° ° °For additional information about how and where to safely dispose of unused opioid °medications - https://www.[employer]powerful[employer].org ° °Disclaimer: This document contains information and/or instructional materials adapted from [facility] for the typical patient with your condition. It does not replace medical advice from your health care provider because your experience may differ from that of the °typical patient. Talk to your health care provider if you have any questions about this °document, your condition or your treatment plan. °Adapted from [facility] ° ° °

## 2018-03-22 NOTE — Anesthesia Postprocedure Evaluation (Signed)
Anesthesia Post Note  Patient: Gabrielle [name]  Procedure(s) Performed: LAPAROSCOPIC CHOLECYSTECTOMY (N/A Abdomen)     Patient location during evaluation: PACU Anesthesia Type: General Level of consciousness: awake and alert Pain management: pain level controlled Vital Signs Assessment: post-procedure vital signs reviewed and stable Respiratory status: spontaneous breathing, nonlabored ventilation and respiratory function stable Cardiovascular status: blood pressure returned to baseline and stable Postop Assessment: no apparent nausea or vomiting Anesthetic complications: no    Last Vitals:  Vitals:   03/22/18 1445 03/22/18 1500  BP: 133/80 134/73  Pulse: 73 68  Resp: 12 12  Temp:    SpO2: 100% 98%    Last Pain:  Vitals:   03/22/18 1445  TempSrc:   PainSc: Asleep                 Lynda Rainwater

## 2018-03-23 ENCOUNTER — Encounter (HOSPITAL_COMMUNITY): Payer: Self-pay | Admitting: General Surgery

## 2018-03-29 ENCOUNTER — Other Ambulatory Visit: Payer: Self-pay | Admitting: Cardiovascular Disease

## 2018-04-22 DIAGNOSIS — L509 Urticaria, unspecified: Secondary | ICD-10-CM | POA: Diagnosis not present

## 2018-05-29 DIAGNOSIS — I2510 Atherosclerotic heart disease of native coronary artery without angina pectoris: Secondary | ICD-10-CM | POA: Diagnosis not present

## 2018-05-29 DIAGNOSIS — E039 Hypothyroidism, unspecified: Secondary | ICD-10-CM | POA: Diagnosis not present

## 2018-05-29 DIAGNOSIS — I10 Essential (primary) hypertension: Secondary | ICD-10-CM | POA: Diagnosis not present

## 2018-05-29 DIAGNOSIS — E782 Mixed hyperlipidemia: Secondary | ICD-10-CM | POA: Diagnosis not present

## 2018-05-29 DIAGNOSIS — L509 Urticaria, unspecified: Secondary | ICD-10-CM | POA: Diagnosis not present

## 2018-05-29 DIAGNOSIS — M8580 Other specified disorders of bone density and structure, unspecified site: Secondary | ICD-10-CM | POA: Diagnosis not present

## 2018-05-29 DIAGNOSIS — M25561 Pain in right knee: Secondary | ICD-10-CM | POA: Diagnosis not present

## 2018-05-29 DIAGNOSIS — R7301 Impaired fasting glucose: Secondary | ICD-10-CM | POA: Diagnosis not present

## 2018-05-29 DIAGNOSIS — N189 Chronic kidney disease, unspecified: Secondary | ICD-10-CM | POA: Diagnosis not present

## 2018-05-29 DIAGNOSIS — G629 Polyneuropathy, unspecified: Secondary | ICD-10-CM | POA: Diagnosis not present

## 2018-05-31 DIAGNOSIS — M25561 Pain in right knee: Secondary | ICD-10-CM | POA: Diagnosis not present

## 2018-05-31 DIAGNOSIS — N189 Chronic kidney disease, unspecified: Secondary | ICD-10-CM | POA: Diagnosis not present

## 2018-05-31 DIAGNOSIS — I2510 Atherosclerotic heart disease of native coronary artery without angina pectoris: Secondary | ICD-10-CM | POA: Diagnosis not present

## 2018-05-31 DIAGNOSIS — L509 Urticaria, unspecified: Secondary | ICD-10-CM | POA: Diagnosis not present

## 2018-05-31 DIAGNOSIS — G629 Polyneuropathy, unspecified: Secondary | ICD-10-CM | POA: Diagnosis not present

## 2018-05-31 DIAGNOSIS — R7301 Impaired fasting glucose: Secondary | ICD-10-CM | POA: Diagnosis not present

## 2018-05-31 DIAGNOSIS — E782 Mixed hyperlipidemia: Secondary | ICD-10-CM | POA: Diagnosis not present

## 2018-05-31 DIAGNOSIS — M8580 Other specified disorders of bone density and structure, unspecified site: Secondary | ICD-10-CM | POA: Diagnosis not present

## 2018-05-31 DIAGNOSIS — I10 Essential (primary) hypertension: Secondary | ICD-10-CM | POA: Diagnosis not present

## 2018-05-31 DIAGNOSIS — E039 Hypothyroidism, unspecified: Secondary | ICD-10-CM | POA: Diagnosis not present

## 2018-06-10 DIAGNOSIS — M25561 Pain in right knee: Secondary | ICD-10-CM | POA: Diagnosis not present

## 2018-06-18 ENCOUNTER — Other Ambulatory Visit: Payer: Self-pay

## 2018-06-18 ENCOUNTER — Ambulatory Visit (INDEPENDENT_AMBULATORY_CARE_PROVIDER_SITE_OTHER): Payer: Medicare Other | Admitting: Allergy & Immunology

## 2018-06-18 ENCOUNTER — Encounter: Payer: Self-pay | Admitting: Allergy & Immunology

## 2018-06-18 VITALS — BP 142/64 | HR 85 | Temp 98.0°F | Resp 20 | Ht 60.0 in | Wt 181.2 lb

## 2018-06-18 DIAGNOSIS — R748 Abnormal levels of other serum enzymes: Secondary | ICD-10-CM | POA: Diagnosis not present

## 2018-06-18 DIAGNOSIS — L508 Other urticaria: Secondary | ICD-10-CM

## 2018-06-18 DIAGNOSIS — J302 Other seasonal allergic rhinitis: Secondary | ICD-10-CM | POA: Diagnosis not present

## 2018-06-18 MED ORDER — FAMOTIDINE 20 MG PO TABS: 20.0000 mg | ORAL_TABLET | Freq: Every day | ORAL | 5 refills | Status: DC

## 2018-06-18 MED ORDER — FEXOFENADINE HCL 180 MG PO TABS: ORAL_TABLET | ORAL | 5 refills | Status: DC

## 2018-06-18 MED ORDER — FEXOFENADINE HCL 180 MG PO TABS: 180.0000 mg | ORAL_TABLET | Freq: Two times a day (BID) | ORAL | 5 refills | Status: DC

## 2018-06-18 MED ORDER — CETIRIZINE HCL 10 MG PO TABS: 10.0000 mg | ORAL_TABLET | Freq: Two times a day (BID) | ORAL | 5 refills | Status: DC

## 2018-06-18 MED ORDER — CETIRIZINE HCL 10 MG PO TABS: ORAL_TABLET | ORAL | 5 refills | Status: DC

## 2018-06-18 NOTE — Progress Notes (Addendum)
NEW PATIENT  Date of Service/Encounter:  06/18/18  Referring provider: Hulan Fess, MD   Assessment:   Chronic urticaria  Seasonal allergic rhinitis - with worsening symptoms in the spring   Ms. Andon reports that she has had 2 months of chronic urticaria.  This certainly fits the definition of chronic urticaria and we will get some lab work to evaluate for this.  Unfortunately, she was very dermatographic today and we cannot do skin testing.  We will do the same thing with the blood testing however, to see if there are any triggers that we can find.  She did have a recently normal complete metabolic panel and a complete blood count, so I did not get these today.  We are going to treat with suppressive doses of antihistamine, targeting both the H1 and H2 receptors, in hopes that this can control her symptoms better than her current regimen.   Plan/Recommendations:   1. Chronic urticaria - Your history does not have any "red flags" such as fevers, joint pains, or permanent skin changes that would be concerning for a more serious cause of hives.  - We will get some labs to rule out serious causes of hives: environmental allergy panel, basic food panel, tryptase level, alpha gal panel, ESR, and CRP. - Chronic hives are often times a self limited process and will "burn themselves out" over 6-12 months, although this is not always the case.  - In the meantime, start suppressive dosing of antihistamines:   - Morning: Allegra (fexofenadine) 180-'360mg'$  (two tablets)   - Evening: Zyrtec (cetirizine) 10-'20mg'$  (two tablets)  - If the above is not working, try adding: Pepcid (famotidine) '20mg'$  - You can change this dosing at home, decreasing the dose as needed or increasing the dosing as needed.  - If you are not tolerating the medications or are tired of taking them every day, we can start treatment with a monthly injectable medication called Xolair.   2. Return in about 3 months (around  09/18/2018). This can be an in-person, a virtual Webex or a telephone follow up visit.   Subjective:   Gabrielle [name] is a 73 y.o. female presenting today for evaluation of  Chief Complaint  Patient presents with  . Urticaria    first episode in March,     Gabrielle [name] has a history of the following: Patient Active Problem List   Diagnosis Date Noted  . Cholecystitis with cholelithiasis 03/22/2018  . S/P lumbar fusion 03/07/2017  . Hematoma of arm 08/24/2015  . Coronary artery disease involving native coronary artery of native heart without angina pectoris   . Dyslipidemia 08/22/2015  . Heart murmur   . History of ST elevation myocardial infarction (STEMI) 08/21/2015  . Appendicitis, acute 08/14/2013  . S/P laparoscopic appendectomy 08/13/2013  . Neuropathy 11/11/2012  . Hypertension 01/17/2011    History obtained from: chart review and patient.  Gabrielle [name] was referred by Hulan Fess, MD.     Gabrielle [name] is a 73 y.o. female presenting for an evaluation of hives.  She first had hives two months ago. They popped up all over her body. She took a course of prednisone with complete resolution of her symptoms. She takes Claritin-D at night which seems to work for her. She started taking Benadryl in the morning, which does knock her out somewhat. She cut it down to one tablet which she seems to tolerate without a problem. The seem to to pop up all over without a  pattern. There have been no changes with her medications or detergents.  She does not eat red meat often at all and denies tick bites.  These hives have never woken her up in the middle the night.  She has never had any systemic reactions, including throat swelling, wheezing, abdominal pain, or passing out.  She has never been to the emergency room for these episodes.  With resolution it does leave normal skin. However she has noticed that when the hives are on the fatty parts of the arms or legs, they seem to bruise more than  normal.  She denies any fevers or joint pain.  She does have underlying arthritis, which she has had for years.  She currently treats this with CBD oil twice daily.  She has been on the same brand of CBD oil for years.  She does have a history of seasonal allergic rhinitis, which is worse in the spring.  She takes Claritin-D to treat this.  She does not take this throughout the year.  It does not seem to affect her blood pressure at all.  She does not take it during the summer months, but will take it again during the fall.  She has never been allergy tested and is never been on allergen immunotherapy.  She has no history of eczema.  She has no history of food allergies.  She tolerates all of the food allergens without adverse event.   Otherwise, there is no history of other atopic diseases, including asthma, food allergies, drug allergies, stinging insect allergies, eczema or contact dermatitis. There is no significant infectious history. Vaccinations are up to date.    Past Medical History: Patient Active Problem List   Diagnosis Date Noted  . Cholecystitis with cholelithiasis 03/22/2018  . S/P lumbar fusion 03/07/2017  . Hematoma of arm 08/24/2015  . Coronary artery disease involving native coronary artery of native heart without angina pectoris   . Dyslipidemia 08/22/2015  . Heart murmur   . History of ST elevation myocardial infarction (STEMI) 08/21/2015  . Appendicitis, acute 08/14/2013  . S/P laparoscopic appendectomy 08/13/2013  . Neuropathy 11/11/2012  . Hypertension 01/17/2011    Medication List:  Allergies as of 06/18/2018      Reactions   Penicillins Other (See Comments)   Allergy as an infant - no other information available Has patient had a PCN reaction causing immediate rash, facial/tongue/throat swelling, SOB or lightheadedness with hypotension: Unknown Has patient had a PCN reaction causing severe rash involving mucus membranes or skin necrosis: Unknown Has patient  had a PCN reaction that required hospitalization: No Has patient had a PCN reaction occurring within the last 10 years: No If all of the above answers are "NO", then may proceed with Cephalospor   Pravastatin Other (See Comments)   Effects liver function      Medication List       Accurate as of Jun 18, 2018 10:42 AM. If you have any questions, ask your nurse or doctor.        STOP taking these medications   HYDROcodone-acetaminophen 5-325 MG tablet Commonly known as:  Norco Stopped by:  Valentina Shaggy, MD     TAKE these medications   aspirin EC 81 MG tablet Take 81 mg by mouth at bedtime.   Benadryl Allergy 25 mg capsule Generic drug:  diphenhydrAMINE Benadryl   benazepril 20 MG tablet Commonly known as:  LOTENSIN Take 20 mg by mouth 2 (two) times daily.   CALCIUM 600 +  D PO Take 1 tablet by mouth 2 (two) times daily.   cetirizine 10 MG tablet Commonly known as:  ZYRTEC Take 1 tablet (10 mg total) by mouth 2 (two) times a day. Two tablets in the evening. Started by:  Valentina Shaggy, MD   diphenhydramine-acetaminophen 25-500 MG Tabs tablet Commonly known as:  TYLENOL PM Take 3 tablets by mouth every morning.   docusate sodium 100 MG capsule Commonly known as:  COLACE Take 100 mg by mouth 2 (two) times daily.   ezetimibe 10 MG tablet Commonly known as:  ZETIA TAKE 1 TABLET BY MOUTH EVERY DAY   famotidine 20 MG tablet Commonly known as:  Pepcid Take 1 tablet (20 mg total) by mouth daily. Started by:  Valentina Shaggy, MD   ferrous sulfate 325 (65 FE) MG tablet Take 325 mg by mouth daily with breakfast.   fexofenadine 180 MG tablet Commonly known as:  ALLEGRA Take 1 tablet (180 mg total) by mouth 2 (two) times a day. Two tablets in the morning. Started by:  Valentina Shaggy, MD   fish oil-omega-3 fatty acids 1000 MG capsule Take 1 g by mouth every evening.   gabapentin 600 MG tablet Commonly known as:  NEURONTIN Take 600 mg by  mouth 2 (two) times daily.   GLUCOSAMINE CHONDR 1500 COMPLX PO Take 2 tablets by mouth daily.   levothyroxine 75 MCG tablet Commonly known as:  SYNTHROID Take 75 mcg by mouth daily before breakfast.   loratadine-pseudoephedrine 10-240 MG 24 hr tablet Commonly known as:  CLARITIN-D 24-hour Take 1 tablet by mouth every evening.   meclizine 25 MG tablet Commonly known as:  ANTIVERT Take 25 mg by mouth 3 (three) times daily as needed for dizziness.   Melatonin 5 MG Caps Take 5 mg by mouth at bedtime.   metoprolol tartrate 25 MG tablet Commonly known as:  LOPRESSOR TAKE ONE-HALF TABLET BY MOUTH 2 (TWO) TIMES DAILY. What changed:  See the new instructions.   nitroGLYCERIN 0.4 MG SL tablet Commonly known as:  NITROSTAT Place 1 tablet (0.4 mg total) under the tongue every 5 (five) minutes x 3 doses as needed for chest pain.   NON FORMULARY Place 1 drop under the tongue every morning. CBD Oil   omeprazole 20 MG capsule Commonly known as:  PRILOSEC Take 20 mg by mouth daily with breakfast.   polyethylene glycol 17 g packet Commonly known as:  MIRALAX / GLYCOLAX Take 17 g by mouth daily as needed for mild constipation.   potassium chloride 10 MEQ tablet Commonly known as:  K-DUR Take 10 mEq by mouth daily.   rosuvastatin 40 MG tablet Commonly known as:  CRESTOR Take 40 mg by mouth at bedtime.   SALONPAS PAIN RELIEF PATCH EX Place 1 patch onto the skin daily as needed (pain).   triamterene-hydrochlorothiazide 37.5-25 MG tablet Commonly known as:  MAXZIDE-25 Take 1 tablet by mouth daily.   Vitamin D 50 MCG (2000 UT) Caps Take 2,000 Units by mouth daily.       Birth History: non-contributory  Developmental History: non-contributory  Past Surgical History: Past Surgical History:  Procedure Laterality Date  . ABDOMINAL HYSTERECTOMY  01-17-11   '93-Hysterectomy-heavy bleeding  . ANTERIOR LATERAL LUMBAR FUSION WITH PERCUTANEOUS SCREW 1 LEVEL N/A 03/07/2017    Procedure: XLIF L3-4;  Surgeon: Melina Schools, MD;  Location: [place];  Service: Orthopedics;  Laterality: N/A;  4 hrs  . APPENDECTOMY    . BACK SURGERY  01-17-11  04-20-10-T-Lift lumbar fusion  . [facility] PROCEDURE  01-17-11   Bladder sling  . CARDIAC CATHETERIZATION N/A 08/21/2015   Procedure: Left Heart Cath and Coronary Angiography;  Surgeon: Sherren Mocha, MD;  Location: [place] [facility];  Service: Cardiovascular;  Laterality: N/A;  . CARDIAC CATHETERIZATION N/A 08/21/2015   Procedure: Coronary Stent Intervention;  Surgeon: Sherren Mocha, MD;  Location: [place] [facility];  Service: Cardiovascular;  Laterality: N/A;  . CARDIAC CATHETERIZATION N/A 08/23/2015   Procedure: Coronary Stent Intervention;  Surgeon: Leonie Man, MD;  Location: [place] [facility];  Service: Cardiovascular;  Laterality: N/A;  promus 2.5x16 in RCA  . CARDIAC CATHETERIZATION N/A 08/23/2015   Procedure: Left Heart Cath and Coronary Angiography;  Surgeon: Leonie Man, MD;  Location: [place] [facility];  Service: Cardiovascular;  Laterality: N/A;  . CARPAL TUNNEL RELEASE  01-17-11   '02-left, also right  . CHOLECYSTECTOMY N/A 03/22/2018   Procedure: LAPAROSCOPIC CHOLECYSTECTOMY;  Surgeon: Fanny Skates, MD;  Location: [place];  Service: General;  Laterality: N/A;  . COLONOSCOPY    . HARDWARE REMOVAL Right 05/15/2013   Procedure: REMOVAL RIGHT L4-L5 PEDICLE SCREWS AND ROD ;  Surgeon: Melina Schools, MD;  Location: [place];  Service: Orthopedics;  Laterality: Right;  . JOINT REPLACEMENT  01-17-11    RTKA' 02  . LAPAROSCOPIC APPENDECTOMY N/A 08/13/2013   Procedure: APPENDECTOMY LAPAROSCOPIC;  Surgeon: Pedro Earls, MD;  Location: [facility];  Service: General;  Laterality: N/A;  . TONSILLECTOMY  01-17-11   child  . TOTAL KNEE ARTHROPLASTY  01/19/2011   Procedure: TOTAL KNEE ARTHROPLASTY;  Surgeon: Johnn Hai;  Location: [facility];  Service: Orthopedics;  Laterality: Left;     Family History: Family History   Problem Relation Age of Onset  . Aneurysm Mother   . Heart disease Father   . Heart disease Brother   . Neuropathy Neg Hx   . Urticaria Neg Hx   . Allergic rhinitis Neg Hx   . Asthma Neg Hx      Social History: Gabriela lives at home with her husband of 50+ years.  She does in a house that is 73 years old.  There is carpeting in the main living areas and hardwood and carpeting in the bedrooms.  They have gas and electric heating with central cooling.  There is 1 dog and 2 cats at home.  There is no smoking exposure, but she did smoke for around 20 years, quitting around 20 years ago.  She does not have dust mite covers on her bedding.  She worked as a Marine scientist prior to retiring.  She has 3 children and 7 grandchildren.   Review of Systems  Constitutional: Negative.  Negative for chills, fever, malaise/fatigue and weight loss.  HENT: Negative.  Negative for congestion, ear discharge and ear pain.   Eyes: Negative for pain, discharge and redness.  Respiratory: Negative for cough, sputum production, shortness of breath and wheezing.   Cardiovascular: Negative.  Negative for chest pain and palpitations.  Gastrointestinal: Negative for constipation, diarrhea, heartburn, nausea and vomiting.  Genitourinary: Negative for dysuria and hematuria.  Skin: Positive for itching and rash.  Neurological: Negative for dizziness and headaches.  Endo/Heme/Allergies: Negative for environmental allergies. Does not bruise/bleed easily.       Objective:   Blood pressure (!) 142/64, pulse 85, temperature 98 F (36.7 C), temperature source Temporal, resp. rate 20, height 5' (1.524 m), weight 181 lb 3.2 oz (82.2 kg), SpO2 96 %. Body  mass index is 35.39 kg/m.   Physical Exam:   Physical Exam  Constitutional: She appears well-developed.  Very pleasant.   HENT:  Head: Normocephalic and atraumatic.  Right Ear: Tympanic membrane, external ear and ear canal normal. No drainage, swelling or tenderness.  Tympanic membrane is not injected, not scarred, not erythematous, not retracted and not bulging.  Left Ear: Tympanic membrane, external ear and ear canal normal. No drainage, swelling or tenderness. Tympanic membrane is not injected, not scarred, not erythematous, not retracted and not bulging.  Nose: Mucosal edema present. No rhinorrhea, nasal deformity or septal deviation. No epistaxis. Right sinus exhibits no maxillary sinus tenderness and no frontal sinus tenderness. Left sinus exhibits no maxillary sinus tenderness and no frontal sinus tenderness.  Mouth/Throat: Uvula is midline and oropharynx is clear and moist. Mucous membranes are not pale and not dry.  Eyes: Pupils are equal, round, and reactive to light. Conjunctivae and EOM are normal. Right eye exhibits no chemosis and no discharge. Left eye exhibits no chemosis and no discharge. Right conjunctiva is not injected. Left conjunctiva is not injected.  Cardiovascular: Normal rate, regular rhythm and normal heart sounds.  Respiratory: Effort normal and breath sounds normal. No accessory muscle usage. No tachypnea. No respiratory distress. She has no wheezes. She has no rhonchi. She has no rales. She exhibits no tenderness.  Moving air well in all lung fields.   GI: There is no abdominal tenderness. There is no rebound and no guarding.  Lymphadenopathy:       Head (right side): No submandibular, no tonsillar and no occipital adenopathy present.       Head (left side): No submandibular, no tonsillar and no occipital adenopathy present.    She has no cervical adenopathy.  Neurological: She is alert.  Skin: No abrasion, no petechiae and no rash noted. Rash is not papular, not vesicular and not urticarial. No erythema. No pallor.  Very dermatographic. Excoriations present.   Psychiatric: She has a normal mood and affect.     Diagnostic studies: labs sent instead       Salvatore Marvel, MD [facility] [place]

## 2018-06-18 NOTE — Patient Instructions (Addendum)
1. Chronic urticaria - Your history does not have any "red flags" such as fevers, joint pains, or permanent skin changes that would be concerning for a more serious cause of hives.  - We will get some labs to rule out serious causes of hives: environmental allergy panel, basic food panel, tryptase level, alpha gal panel, ESR, and CRP. - Chronic hives are often times a self limited process and will "burn themselves out" over 6-12 months, although this is not always the case.  - In the meantime, start suppressive dosing of antihistamines:   - Morning: Allegra (fexofenadine) 180-322m (two tablets)   - Evening: Zyrtec (cetirizine) 10-238m(two tablets)  - If the above is not working, try adding: Pepcid (famotidine) 2084m You can change this dosing at home, decreasing the dose as needed or increasing the dosing as needed.  - If you are not tolerating the medications or are tired of taking them every day, we can start treatment with a monthly injectable medication called Xolair.   2. Return in about 3 months (around 09/18/2018). This can be an in-person, a virtual Webex or a telephone follow up visit.   Please inform us [place] any Emergency Department visits, hospitalizations, or changes in symptoms. Call us [place] going to the ED for breathing or allergy symptoms since we might be able to fit you in for a sick visit. Feel free to contact us [place] with any questions, problems, or concerns.  It was a pleasure to meet you today!  Websites that have reliable patient information: 1. American Academy of Asthma, Allergy, and Immunology: www.aaaai.org 2. Food Allergy Research and Education (FARE): foodallergy.org 3. Mothers of Asthmatics: http://www.asthmacommunitynetwork.org 4. American College of Allergy, Asthma, and Immunology: www.acaai.org  "Like" us [place] Facebook and Instagram for our latest updates!      Make sure you are registered to vote! If you have moved or changed any of your contact  information, you will need to get this updated before voting!    Voter ID laws are NOT going into effect for the General Election in November 2020! DO NOT let this stop you from exercising your right to vote!

## 2018-06-22 LAB — IGE+ALLERGENS ZONE 2(30)
Alternaria Alternata IgE: <0.1 kU/L
Amer Sycamore IgE Qn: <0.1 kU/L
Aspergillus Fumigatus IgE: <0.1 kU/L
Bahia Grass IgE: 0.32 kU/L — AB
Bermuda Grass IgE: <0.1 kU/L
Cat Dander IgE: 20.4 kU/L — AB
Cedar, Mountain IgE: 0.53 kU/L — AB
Cladosporium Herbarum IgE: <0.1 kU/L
Cockroach, American IgE: <0.1 kU/L
Common Silver Birch IgE: <0.1 kU/L
D Farinae IgE: 2.97 kU/L — AB
D Pteronyssinus IgE: 2.32 kU/L — AB
Dog Dander IgE: 1.89 kU/L — AB
Elm, American IgE: <0.1 kU/L
Hickory, White IgE: 1.15 kU/L — AB
IgE (Immunoglobulin E), Serum: 623 IU/mL — ABNORMAL HIGH (ref 6–495)
Johnson Grass IgE: 0.51 kU/L — AB
Maple/Box Elder IgE: 1.44 kU/L — AB
Mucor Racemosus IgE: <0.1 kU/L
Mugwort IgE Qn: 0.14 kU/L — AB
Nettle IgE: <0.1 kU/L
Oak, White IgE: <0.1 kU/L
Penicillium Chrysogen IgE: <0.1 kU/L
Pigweed, Rough IgE: <0.1 kU/L
Plantain, English IgE: <0.1 kU/L
Ragweed, Short IgE: 12.4 kU/L — AB
Sheep Sorrel IgE Qn: <0.1 kU/L
Stemphylium Herbarum IgE: <0.1 kU/L
Sweet gum IgE RAST Ql: <0.1 kU/L
Timothy Grass IgE: 3.03 kU/L — AB
White Mulberry IgE: <0.1 kU/L

## 2018-06-22 LAB — C-REACTIVE PROTEIN: CRP: 2 mg/L (ref 0–10)

## 2018-06-22 LAB — ALLERGEN PROFILE, BASIC FOOD
Allergen Corn, IgE: <0.1 kU/L
Beef IgE: 0.28 kU/L — AB
Chocolate/Cacao IgE: <0.1 kU/L
Egg, Whole IgE: 0.16 kU/L — AB
Food Mix (Seafoods) IgE: NEGATIVE
Milk IgE: 0.27 kU/L — AB
Peanut IgE: <0.1 kU/L
Pork IgE: 0.38 kU/L — AB
Soybean IgE: <0.1 kU/L
Wheat IgE: 0.17 kU/L — AB

## 2018-06-22 LAB — ALPHA-GAL PANEL
Alpha Gal IgE*: <0.1 kU/L (ref <0.10)
Beef (Bos spp) IgE: 0.3 kU/L (ref <0.35)
Class Interpretation: 0
Class Interpretation: 1
Lamb/Mutton (Ovis spp) IgE: <0.1 kU/L (ref <0.35)
Pork (Sus spp) IgE: 0.35 kU/L — ABNORMAL HIGH (ref <0.35)

## 2018-06-22 LAB — THYROID ANTIBODIES
Thyroglobulin Antibody: <1 IU/mL (ref 0.0–0.9)
Thyroperoxidase Ab SerPl-aCnc: 14 IU/mL (ref 0–34)

## 2018-06-22 LAB — TRYPTASE: Tryptase: 21.1 ug/L — ABNORMAL HIGH (ref 2.2–13.2)

## 2018-06-22 LAB — SEDIMENTATION RATE: Sed Rate: 32 mm/hr (ref 0–40)

## 2018-06-26 ENCOUNTER — Encounter: Payer: Self-pay | Admitting: Allergy & Immunology

## 2018-06-27 NOTE — Addendum Note (Signed)
Addended by: Valere Dross on: 06/27/2018 09:58 AM   Modules accepted: Orders

## 2018-06-28 DIAGNOSIS — L508 Other urticaria: Secondary | ICD-10-CM | POA: Diagnosis not present

## 2018-06-28 DIAGNOSIS — R748 Abnormal levels of other serum enzymes: Secondary | ICD-10-CM | POA: Diagnosis not present

## 2018-06-30 LAB — TRYPTASE: Tryptase: 20.2 ug/L — ABNORMAL HIGH (ref 2.2–13.2)

## 2018-07-04 ENCOUNTER — Telehealth: Payer: Self-pay | Admitting: *Deleted

## 2018-07-04 NOTE — Telephone Encounter (Signed)
Venom panel was able to be added to lab work

## 2018-07-04 NOTE — Telephone Encounter (Signed)
Thank you so much for taking care of that, [facility]!  Salvatore Marvel, MD [facility] [place] [facility] [place]

## 2018-07-04 NOTE — Telephone Encounter (Signed)
Spoke to patient in regards to lab result patient verbalized understanding. Add on for Venom panel given to [facility] to add on patient had blood work done on Friday 06/28/2018.   Karena Addison can you refer patient to hematologist/oncologist

## 2018-07-04 NOTE — Addendum Note (Signed)
Addended by: Valentina Shaggy on: 07/04/2018 09:21 AM   Modules accepted: Orders

## 2018-07-04 NOTE — Telephone Encounter (Signed)
Referral has been placed to oncology.   Thanks

## 2018-07-04 NOTE — Telephone Encounter (Signed)
-----   Message from Valentina Shaggy, MD sent at 07/04/2018  9:21 AM EDT ----- MyChart message sent. The serum tryptase is still elevated. I am going to add on a stinging insect panel, but I think that the blood is likely either too old or in the wrong tube. Sometimes elevated tryptase can be seen in patients with stinging insect allergies. I also think she needs to see a hematologist/oncologist to make sure we are ruling a mast cell neoplasm.   Salvatore Marvel, MD [facility] [place] [facility] [place]

## 2018-07-06 LAB — ALLERGEN STINGING INSECT PANEL
Honeybee IgE: 0.25 kU/L — AB
Hornet, White Face, IgE: 0.16 kU/L — AB
Hornet, Yellow, IgE: <0.1 kU/L
Paper Wasp IgE: 0.15 kU/L — AB
Yellow Jacket, IgE: 0.12 kU/L — AB

## 2018-07-06 LAB — SPECIMEN STATUS REPORT

## 2018-07-08 ENCOUNTER — Telehealth: Payer: Self-pay | Admitting: Internal Medicine

## 2018-07-08 NOTE — Telephone Encounter (Signed)
A new hem appt has been scheduled for Gabrielle [name] to see Dr. Walden Field on 6/18 at 950am. Pt has been made aware to arrive 20 minutes early.

## 2018-07-10 ENCOUNTER — Telehealth: Payer: Self-pay | Admitting: Internal Medicine

## 2018-07-10 NOTE — Telephone Encounter (Signed)
Pt cld to rschedule appt w/Dr. Walden Field to 6/12 at 1pm. Pt aware to arrive 20 minutes.

## 2018-07-10 NOTE — Telephone Encounter (Signed)
Patient is scheduled for 07/18/2018 for Oncology

## 2018-07-10 NOTE — Telephone Encounter (Signed)
Thanks, Dee!!   Logan Vegh, MD [facility] [place]  

## 2018-07-12 ENCOUNTER — Inpatient Hospital Stay: Payer: Medicare Other | Attending: Internal Medicine | Admitting: Internal Medicine

## 2018-07-12 ENCOUNTER — Inpatient Hospital Stay: Payer: Medicare Other

## 2018-07-12 ENCOUNTER — Other Ambulatory Visit: Payer: Self-pay

## 2018-07-12 VITALS — BP 140/67 | HR 86 | Temp 97.5°F | Resp 18 | Ht 60.0 in | Wt 185.7 lb

## 2018-07-12 DIAGNOSIS — R197 Diarrhea, unspecified: Secondary | ICD-10-CM | POA: Diagnosis not present

## 2018-07-12 DIAGNOSIS — L299 Pruritus, unspecified: Secondary | ICD-10-CM

## 2018-07-12 DIAGNOSIS — K5900 Constipation, unspecified: Secondary | ICD-10-CM

## 2018-07-12 DIAGNOSIS — R7989 Other specified abnormal findings of blood chemistry: Secondary | ICD-10-CM | POA: Diagnosis not present

## 2018-07-12 DIAGNOSIS — Z7289 Other problems related to lifestyle: Secondary | ICD-10-CM | POA: Diagnosis not present

## 2018-07-12 DIAGNOSIS — R140 Abdominal distension (gaseous): Secondary | ICD-10-CM | POA: Diagnosis not present

## 2018-07-12 DIAGNOSIS — R935 Abnormal findings on diagnostic imaging of other abdominal regions, including retroperitoneum: Secondary | ICD-10-CM

## 2018-07-12 DIAGNOSIS — I10 Essential (primary) hypertension: Secondary | ICD-10-CM | POA: Insufficient documentation

## 2018-07-12 DIAGNOSIS — R899 Unspecified abnormal finding in specimens from other organs, systems and tissues: Secondary | ICD-10-CM

## 2018-07-12 DIAGNOSIS — K700 Alcoholic fatty liver: Secondary | ICD-10-CM | POA: Diagnosis not present

## 2018-07-12 DIAGNOSIS — D539 Nutritional anemia, unspecified: Secondary | ICD-10-CM

## 2018-07-12 DIAGNOSIS — R232 Flushing: Secondary | ICD-10-CM | POA: Diagnosis not present

## 2018-07-12 DIAGNOSIS — M1990 Unspecified osteoarthritis, unspecified site: Secondary | ICD-10-CM | POA: Diagnosis not present

## 2018-07-12 DIAGNOSIS — Z114 Encounter for screening for human immunodeficiency virus [HIV]: Secondary | ICD-10-CM

## 2018-07-12 LAB — CMP (CANCER CENTER ONLY)
ALT: 46 U/L — ABNORMAL HIGH (ref 0–44)
AST: 26 U/L (ref 15–41)
Albumin: 4.2 g/dL (ref 3.5–5.0)
Alkaline Phosphatase: 103 U/L (ref 38–126)
Anion gap: 13 (ref 5–15)
BUN: 26 mg/dL — ABNORMAL HIGH (ref 8–23)
CO2: 24 mmol/L (ref 22–32)
Calcium: 9.7 mg/dL (ref 8.9–10.3)
Chloride: 99 mmol/L (ref 98–111)
Creatinine: 1.18 mg/dL — ABNORMAL HIGH (ref 0.44–1.00)
GFR, Est AFR Am: 53 mL/min — ABNORMAL LOW (ref >60)
GFR, Estimated: 46 mL/min — ABNORMAL LOW (ref >60)
Glucose, Bld: 101 mg/dL — ABNORMAL HIGH (ref 70–99)
Potassium: 4.3 mmol/L (ref 3.5–5.1)
Sodium: 136 mmol/L (ref 135–145)
Total Bilirubin: 0.3 mg/dL (ref 0.3–1.2)
Total Protein: 7.5 g/dL (ref 6.5–8.1)

## 2018-07-12 LAB — CBC WITH DIFFERENTIAL (CANCER CENTER ONLY)
Abs Immature Granulocytes: 0.02 10*3/uL (ref 0.00–0.07)
Basophils Absolute: 0.1 10*3/uL (ref 0.0–0.1)
Basophils Relative: 1 %
Eosinophils Absolute: 0.3 10*3/uL (ref 0.0–0.5)
Eosinophils Relative: 3 %
HCT: 39.8 % (ref 36.0–46.0)
Hemoglobin: 13.2 g/dL (ref 12.0–15.0)
Immature Granulocytes: 0 %
Lymphocytes Relative: 30 %
Lymphs Abs: 2.4 10*3/uL (ref 0.7–4.0)
MCH: 31.3 pg (ref 26.0–34.0)
MCHC: 33.2 g/dL (ref 30.0–36.0)
MCV: 94.3 fL (ref 80.0–100.0)
Monocytes Absolute: 0.7 10*3/uL (ref 0.1–1.0)
Monocytes Relative: 8 %
Neutro Abs: 4.6 10*3/uL (ref 1.7–7.7)
Neutrophils Relative %: 58 %
Platelet Count: 186 10*3/uL (ref 150–400)
RBC: 4.22 MIL/uL (ref 3.87–5.11)
RDW: 12.7 % (ref 11.5–15.5)
WBC Count: 7.9 10*3/uL (ref 4.0–10.5)
nRBC: 0 % (ref 0.0–0.2)

## 2018-07-12 LAB — SEDIMENTATION RATE: Sed Rate: 28 mm/hr — ABNORMAL HIGH (ref 0–22)

## 2018-07-12 LAB — VITAMIN B12: Vitamin B-12: 205 pg/mL (ref 180–914)

## 2018-07-12 LAB — FOLATE: Folate: 9.4 ng/mL (ref >5.9)

## 2018-07-12 LAB — LACTATE DEHYDROGENASE: LDH: 184 U/L (ref 98–192)

## 2018-07-12 NOTE — Progress Notes (Signed)
Referring Physician.  Salvatore Marvel, MD and Dr. Hulan Fess  Diagnosis Abnormal laboratory test - Plan: CBC with Differential ([facility] Only), CMP ([place] only), Lactate dehydrogenase (LDH), Ferritin, Iron and TIBC, Chromogranin A, Folate, Serum, Vitamin B12, Methylmalonic acid, serum, SPEP with reflex to IFE, Hepatitis B surface antibody, Hepatitis B surface antigen, Hepatitis B core antibody, total, Hepatitis C antibody, HIV antibody (with reflex), Rheumatoid factor, Sedimentation rate, ANA, IFA (with reflex), Tryptase, Miscellaneous [facility] test (send-out), Hemochromatosis DNA, PCR  Flushing - Plan: CBC with Differential ([facility] Only), CMP ([place] only), Lactate dehydrogenase (LDH), Ferritin, Iron and TIBC, Chromogranin A, Folate, Serum, Vitamin B12, Methylmalonic acid, serum, SPEP with reflex to IFE, Hepatitis B surface antibody, Hepatitis B surface antigen, Hepatitis B core antibody, total, Hepatitis C antibody, HIV antibody (with reflex), Rheumatoid factor, Sedimentation rate, ANA, IFA (with reflex), Tryptase, Miscellaneous [facility] test (send-out), Hemochromatosis DNA, PCR  Itching - Plan: CBC with Differential ([facility] Only), CMP ([place] only), Lactate dehydrogenase (LDH), Ferritin, Iron and TIBC, Chromogranin A, Folate, Serum, Vitamin B12, Methylmalonic acid, serum, SPEP with reflex to IFE, Hepatitis B surface antibody, Hepatitis B surface antigen, Hepatitis B core antibody, total, Hepatitis C antibody, HIV antibody (with reflex), Rheumatoid factor, Sedimentation rate, ANA, IFA (with reflex), Tryptase, Miscellaneous [facility] test (send-out), Hemochromatosis DNA, PCR  Screening for HIV (human immunodeficiency virus) - Plan: CBC with Differential ([facility] Only), CMP ([place] only), Lactate dehydrogenase (LDH), Ferritin, Iron and TIBC, Chromogranin A, Folate, Serum, Vitamin B12, Methylmalonic acid, serum, SPEP with reflex to IFE, Hepatitis B surface  antibody, Hepatitis B surface antigen, Hepatitis B core antibody, total, Hepatitis C antibody, HIV antibody (with reflex), Rheumatoid factor, Sedimentation rate, ANA, IFA (with reflex), Tryptase, Miscellaneous [facility] test (send-out), Hemochromatosis DNA, PCR  Abnormal abdominal CT scan - Plan: CBC with Differential ([facility] Only), CMP ([place] only), Lactate dehydrogenase (LDH), Ferritin, Iron and TIBC, Chromogranin A, Folate, Serum, Vitamin B12, Methylmalonic acid, serum, SPEP with reflex to IFE, Hepatitis B surface antibody, Hepatitis B surface antigen, Hepatitis B core antibody, total, Hepatitis C antibody, HIV antibody (with reflex), Rheumatoid factor, Sedimentation rate, ANA, IFA (with reflex), Tryptase, Miscellaneous [facility] test (send-out), Hemochromatosis DNA, PCR  Nutritional anemia - Plan: CBC with Differential ([facility] Center Only), CMP ([place] only), Lactate dehydrogenase (LDH), Ferritin, Iron and TIBC, Chromogranin A, Folate, Serum, Vitamin B12, Methylmalonic acid, serum, SPEP with reflex to IFE, Hepatitis B surface antibody, Hepatitis B surface antigen, Hepatitis B core antibody, total, Hepatitis C antibody, HIV antibody (with reflex), Rheumatoid factor, Sedimentation rate, ANA, IFA (with reflex), Tryptase, Miscellaneous [facility] test (send-out), Hemochromatosis DNA, PCR  Staging Cancer Staging No matching staging information was found for the patient.  Assessment and Plan:  1.  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  73 year old Gabrielle [name] referred for evaluation due to flushing, itching and elevated tryptase level.  Pt has been been by allergy for evaluation.  She reports symptoms began in March 2020. She took a course of prednisone with complete resolution of her symptoms. She takes Claritin-D and benadryl which also improves symptoms.  She denies any changes with her medications or detergents.  She denies wheezes. She denies skin cancer  history.  She denies skin biopsies.  She has abdominal bloating and pain.  She also reports constipation and diarrhea.  She does have underlying arthritis and treats this with CBD oil twice daily.  She she denies any new medications.    Pt had tryptase level done  on 06/18/2018 and was elevated at 20.2.  She had tryptase level repeated on 06/28/2018 and level was again elevated at 21.2.  Pt reports she was not taking Benadryl or Claritin at the time so she had symptoms when labs were drawn.    Pt had USN of the abdomen done 03/11/2018 that showed IMPRESSION: 1. 1.9 cm gallstone. No ultrasound findings to suggest acute cholecystitis. 2. Liver of increased echogenicity consistent with fatty infiltration and/or hepatocellular disease. No focal hepatic lesion noted.  When questioned she drinks alcohol daily.    Pt is seen today for consultation due to  flushing, itching and elevated tryptase level and suspicion of mastocytosis.    Discussion held with pt today that she will undergone lab evaluation to determine if additional work-up with be recommended.  I discussed with her additional work-up may include bone marrow biopsy pending lab results. Awaiting results of  Ferritin, Iron and TIBC, Chromogranin A, Folate, Serum, Vitamin B12, Methylmalonic acid, serum, SPEP with reflex to IFE, Hepatitis B surface antibody, Hepatitis B surface antigen, Hepatitis B core antibody, total, Hepatitis C antibody, HIV antibody (with reflex), Rheumatoid factor, Sedimentation rate, ANA, IFA (with reflex), Tryptase, Kit mutational analysis for mastocytosis and  Hemochromatosis gene evaluation.    Pt will have phone visit follow-up in 2 weeks to go over results.  All questions answered and she expressed understanding of information presented.    2.  GI symptoms.  Pt reports abdominal bloating, diarrhea and constipation.  Pt reports daily alcohol use.  Pt has USN abdomen done 03/11/2018 that showed fatty changes or findings  that may be consistent with HC disease.  Awaiting results of hepatitis and HIV and CHR A level.  Will consider CT imaging for further evaluation once labs reviewed.  Pt reportedly had recent GI evaluation.    3.  Flushing.  Pt has been seen by allergy.  Will check Chr A level to evaluate for possible carcinoid.  Will consider imaging pending results.    4.  Joint pain.  Pt reports history of arthritis.  Awaiting results of ANA, RF, Sed rate, RF, SPEP.  Pt should follow-up with PCP as directed.  Pt will have phone visit follow-up in 2 weeks to go over results.    5.  Hypertension.  BP is 140/67.  Follow-up with PCP for monitoring.    6.  Health maintenance.  Mammogram and GI follow-up as recommended.    40 minutes spent with more than 50% spent in review of records, counseling and coordination of care.    HPI:  73 year old Gabrielle [name] referred for evaluation due to flushing, itching and elevated tryptase level.  Pt has been been by allergy for evaluation.  She reports symptoms began in March 2020. She took a course of prednisone with complete resolution of her symptoms. She takes Claritin-D and benadryl which also improves symptoms.  She denies any changes with her medications or detergents.  She denies wheezes.  She denies skin cancer history.  She denies skin biopsies.   She has abdominal bloating and pain.  She also reports constipation and diarrhea.  She does have underlying arthritis and treats this with CBD oil twice daily.  She she denies any new medications.    Pt had tryptase level done on 06/18/2018 and was elevated at 20.2.  She had tryptase level repeated on 06/28/2018 and level was again elevated at 21.2.  Pt reports she was not taking Benadryl or Claritin at the time so she had  symptoms when labs were drawn.    Pt had USN of the abdomen done 03/11/2018 that showed IMPRESSION: 1. 1.9 cm gallstone. No ultrasound findings to suggest acute cholecystitis. 2. Liver of increased echogenicity  consistent with fatty infiltration and/or hepatocellular disease. No focal hepatic lesion noted.  When questioned she drinks alcohol daily.    Pt is seen today for consultation due to  flushing, itching and elevated tryptase level and suspicion of mastocytosis.     Problem List Patient Active Problem List   Diagnosis Date Noted  . Cholecystitis with cholelithiasis [K80.10] 03/22/2018  . S/P lumbar fusion [Z98.1] 03/07/2017  . Hematoma of arm [S40.029A] 08/24/2015  . Coronary artery disease involving native coronary artery of native heart without angina pectoris [I25.10]   . Dyslipidemia [E78.5] 08/22/2015  . Heart murmur [R01.1]   . History of ST elevation myocardial infarction (STEMI) [I25.2] 08/21/2015  . Appendicitis, acute [K35.80] 08/14/2013  . S/P laparoscopic appendectomy [Z90.49] 08/13/2013  . Neuropathy [G62.9] 11/11/2012  . Hypertension [I10] 01/17/2011    Past Medical History Past Medical History:  Diagnosis Date  . Anemia   . Arthritis 01-17-11   degenerative disc disease-all joints  . Bursitis   . Cholecystitis with cholelithiasis 03/22/2018  . Cholesterol serum elevated 01-17-11   tx. meds  . GERD (gastroesophageal reflux disease) 01-17-11   tx. omeprazole  . Heart murmur    slight murmur  . High cholesterol   . Hypertension 01-17-11   tx. meds  . Hypothyroidism   . Leg pain    ABIs 2/18: normal bilaterally  . Neuropathy    FEET  . Osteoarthritis   . Peripheral neuropathy   . Seasonal allergies   . Sinus problem 01-17-11   tx. Claritin D  . ST elevation (STEMI) myocardial infarction involving left circumflex coronary artery ([place]) 08/21/2015    Past Surgical History Past Surgical History:  Procedure Laterality Date  . ABDOMINAL HYSTERECTOMY  01-17-11   '93-Hysterectomy-heavy bleeding  . ANTERIOR LATERAL LUMBAR FUSION WITH PERCUTANEOUS SCREW 1 LEVEL N/A 03/07/2017   Procedure: XLIF L3-4;  Surgeon: Melina Schools, MD;  Location: [facility];  Service:  Orthopedics;  Laterality: N/A;  4 hrs  . APPENDECTOMY    . BACK SURGERY  01-17-11   04-20-10-T-Lift lumbar fusion  . [facility] PROCEDURE  01-17-11   Bladder sling  . CARDIAC CATHETERIZATION N/A 08/21/2015   Procedure: Left Heart Cath and Coronary Angiography;  Surgeon: Sherren Mocha, MD;  Location: [facility];  Service: Cardiovascular;  Laterality: N/A;  . CARDIAC CATHETERIZATION N/A 08/21/2015   Procedure: Coronary Stent Intervention;  Surgeon: Sherren Mocha, MD;  Location: [facility];  Service: Cardiovascular;  Laterality: N/A;  . CARDIAC CATHETERIZATION N/A 08/23/2015   Procedure: Coronary Stent Intervention;  Surgeon: Leonie Man, MD;  Location: [facility];  Service: Cardiovascular;  Laterality: N/A;  promus 2.5x16 in RCA  . CARDIAC CATHETERIZATION N/A 08/23/2015   Procedure: Left Heart Cath and Coronary Angiography;  Surgeon: Leonie Man, MD;  Location: [facility];  Service: Cardiovascular;  Laterality: N/A;  . CARPAL TUNNEL RELEASE  01-17-11   '02-left, also right  . CHOLECYSTECTOMY N/A 03/22/2018   Procedure: LAPAROSCOPIC CHOLECYSTECTOMY;  Surgeon: Fanny Skates, MD;  Location: [place];  Service: General;  Laterality: N/A;  . COLONOSCOPY    . HARDWARE REMOVAL Right 05/15/2013   Procedure: REMOVAL RIGHT L4-L5 PEDICLE SCREWS AND ROD ;  Surgeon: Melina Schools, MD;  Location: [facility];  Service: Orthopedics;  Laterality: Right;  . JOINT REPLACEMENT  01-17-11    RTKA' 02  . LAPAROSCOPIC APPENDECTOMY N/A 08/13/2013   Procedure: APPENDECTOMY LAPAROSCOPIC;  Surgeon: Pedro Earls, MD;  Location: [facility];  Service: General;  Laterality: N/A;  . TONSILLECTOMY  01-17-11   child  . TOTAL KNEE ARTHROPLASTY  01/19/2011   Procedure: TOTAL KNEE ARTHROPLASTY;  Surgeon: Johnn Hai;  Location: [facility];  Service: Orthopedics;  Laterality: Left;    Family History Family History  Problem Relation Age of Onset  . Aneurysm Mother   . Heart disease Father   . Heart  disease Brother   . Neuropathy Neg Hx   . Urticaria Neg Hx   . Allergic rhinitis Neg Hx   . Asthma Neg Hx      Social History  reports that she quit smoking about 34 years ago. Her smoking use included cigarettes. She has a 19.00 pack-year smoking history. She has never used smokeless tobacco. She reports current alcohol use of about 2.0 standard drinks of alcohol per week. She reports that she does not use drugs.  Medications  Current Outpatient Medications:  .  aspirin EC 81 MG tablet, Take 81 mg by mouth at bedtime. , Disp: , Rfl:  .  benazepril (LOTENSIN) 20 MG tablet, Take 20 mg by mouth 2 (two) times daily. , Disp: , Rfl:  .  Calcium Carbonate-Vitamin D (CALCIUM 600 + D PO), Take 1 tablet by mouth 2 (two) times daily.  , Disp: , Rfl:  .  cetirizine (ZYRTEC) 10 MG tablet, (20 mg) Two tablets in the evening daily, Disp: 60 tablet, Rfl: 5 .  Cholecalciferol (VITAMIN D) 2000 units CAPS, Take 2,000 Units by mouth daily., Disp: , Rfl:  .  diphenhydramine-acetaminophen (TYLENOL PM) 25-500 MG TABS tablet, Take 3 tablets by mouth every morning., Disp: , Rfl:  .  docusate sodium (COLACE) 100 MG capsule, Take 100 mg by mouth 2 (two) times daily. , Disp: , Rfl:  .  ezetimibe (ZETIA) 10 MG tablet, TAKE 1 TABLET BY MOUTH EVERY DAY, Disp: 90 tablet, Rfl: 3 .  ferrous sulfate 325 (65 FE) MG tablet, Take 325 mg by mouth daily with breakfast., Disp: , Rfl:  .  fexofenadine (ALLEGRA) 180 MG tablet, Take 2 tablets every morning., Disp: 60 tablet, Rfl: 5 .  fish oil-omega-3 fatty acids 1000 MG capsule, Take 1 g by mouth every evening. , Disp: , Rfl:  .  gabapentin (NEURONTIN) 600 MG tablet, Take 600 mg by mouth 2 (two) times daily., Disp: , Rfl:  .  Glucosamine-Chondroit-Vit C-Mn (GLUCOSAMINE CHONDR 1500 COMPLX PO), Take 2 tablets by mouth daily., Disp: , Rfl:  .  levothyroxine (SYNTHROID, LEVOTHROID) 75 MCG tablet, Take 75 mcg by mouth daily before breakfast. , Disp: , Rfl:  .   loratadine-pseudoephedrine (CLARITIN-D 24-HOUR) 10-240 MG per 24 hr tablet, Take 1 tablet by mouth every evening. , Disp: , Rfl:  .  meclizine (ANTIVERT) 25 MG tablet, Take 25 mg by mouth 3 (three) times daily as needed for dizziness., Disp: , Rfl:  .  Melatonin 5 MG CAPS, Take 5 mg by mouth at bedtime. , Disp: , Rfl:  .  metoprolol tartrate (LOPRESSOR) 25 MG tablet, TAKE ONE-HALF TABLET BY MOUTH 2 (TWO) TIMES DAILY. (Patient taking differently: Take 12.5 mg by mouth 2 (two) times daily. ), Disp: 90 tablet, Rfl: 3 .  NON FORMULARY, Place 1 drop under the tongue every morning. CBD Oil, Disp: , Rfl:  .  omeprazole (  PRILOSEC) 20 MG capsule, Take 20 mg by mouth daily with breakfast. , Disp: , Rfl:  .  polyethylene glycol (MIRALAX / GLYCOLAX) packet, Take 17 g by mouth daily as needed for mild constipation. , Disp: , Rfl:  .  potassium chloride (K-DUR,KLOR-CON) 10 MEQ tablet, Take 10 mEq by mouth daily. , Disp: , Rfl:  .  rosuvastatin (CRESTOR) 40 MG tablet, Take 40 mg by mouth at bedtime. , Disp: , Rfl: 2 .  triamterene-hydrochlorothiazide (MAXZIDE-25) 37.5-25 MG tablet, Take 1 tablet by mouth daily., Disp: , Rfl: 1 .  famotidine (PEPCID) 20 MG tablet, Take 1 tablet (20 mg total) by mouth daily. (Patient not taking: Reported on 07/12/2018), Disp: 30 tablet, Rfl: 5 .  Liniments (SALONPAS PAIN RELIEF PATCH EX), Place 1 patch onto the skin daily as needed (pain)., Disp: , Rfl:  .  nitroGLYCERIN (NITROSTAT) 0.4 MG SL tablet, Place 1 tablet (0.4 mg total) under the tongue every 5 (five) minutes x 3 doses as needed for chest pain. (Patient not taking: Reported on 06/18/2018), Disp: 25 tablet, Rfl: 2  Allergies Penicillins and Pravastatin  Review of Systems Review of Systems - Oncology ROS negative other than abdominal bloating and pain and Joint pain.     Physical Exam  Vitals Wt Readings from Last 3 Encounters:  07/12/18 185 lb 11.2 oz (84.2 kg)  06/18/18 181 lb 3.2 oz (82.2 kg)  03/22/18 175 lb  (79.4 kg)   Temp Readings from Last 3 Encounters:  07/12/18 (!) 97.5 F (36.4 C) (Oral)  06/18/18 Gabrielle F (36.7 C) (Temporal)  03/22/18 97.6 F (36.4 C)   BP Readings from Last 3 Encounters:  07/12/18 140/67  06/18/18 (!) 142/64  03/22/18 (!) 153/75   Pulse Readings from Last 3 Encounters:  07/12/18 86  06/18/18 85  03/22/18 63    Constitutional: Well-developed, well-nourished, and in no distress.   HENT: Head: Normocephalic and atraumatic.  Mouth/Throat: No oropharyngeal exudate. Mucosa moist. Eyes: Pupils are equal, round, and reactive to light. Conjunctivae are normal. No scleral icterus.  Neck: Normal range of motion. Neck supple. No JVD present.  Cardiovascular: Normal rate, regular rhythm and normal heart sounds.  Exam reveals no gallop and no friction rub.   No murmur heard. Pulmonary/Chest: Effort normal and breath sounds normal. No respiratory distress. No wheezes.No rales.  Abdominal: Soft. Bowel sounds are normal.  Distension noted.  Tender in RUQ.  There is no guarding.  Musculoskeletal: No edema or tenderness.  Lymphadenopathy: No cervical,axillary or supraclavicular adenopathy.  Neurological: Alert and oriented to person, place, and time. No cranial nerve deficit.  Skin: Skin is warm and dry. Erythema noted.   Psychiatric: Affect and judgment normal.   Labs Appointment on 07/12/2018  Component Date Value Ref Range Status  . LDH 07/12/2018 184  Gabrielle - 192 U/L Final   Performed at [facility], [place] [address] [place], [place] [address]  . Sodium 07/12/2018 136  135 - 145 mmol/L Final  . Potassium 07/12/2018 4.3  3.5 - 5.1 mmol/L Final  . Chloride 07/12/2018 99  Gabrielle - 111 mmol/L Final  . CO2 07/12/2018 24  22 - 32 mmol/L Final  . Glucose, Bld 07/12/2018 101* 70 - 99 mg/dL Final  . BUN 07/12/2018 26* 8 - 23 mg/dL Final  . Creatinine 07/12/2018 1.18* 0.44 - 1.00 mg/dL Final  . Calcium 07/12/2018 9.7  8.9 - 10.3 mg/dL Final  . Total  Protein 07/12/2018 7.5  6.5 - 8.1 g/dL Final  .  Albumin 07/12/2018 4.2  3.5 - 5.0 g/dL Final  . AST 07/12/2018 26  15 - 41 U/L Final  . ALT 07/12/2018 46* 0 - 44 U/L Final  . Alkaline Phosphatase 07/12/2018 103  38 - 126 U/L Final  . Total Bilirubin 07/12/2018 0.3  0.3 - 1.2 mg/dL Final  . GFR, Est Non Af Am 07/12/2018 46* >60 mL/min Final  . GFR, Est AFR Am 07/12/2018 53* >60 mL/min Final  . Anion gap 07/12/2018 13  5 - 15 Final   Performed at [facility], [place] [address] [place], [place] [address]  . WBC Count 07/12/2018 7.9  4.0 - 10.5 K/uL Final  . RBC 07/12/2018 4.22  3.87 - 5.11 MIL/uL Final  . Hemoglobin 07/12/2018 13.2  12.0 - 15.0 g/dL Final  . HCT 07/12/2018 39.8  36.0 - 46.0 % Final  . MCV 07/12/2018 94.3  80.0 - 100.0 fL Final  . MCH 07/12/2018 31.3  26.0 - 34.0 pg Final  . MCHC 07/12/2018 33.2  30.0 - 36.0 g/dL Final  . RDW 07/12/2018 12.7  11.5 - 15.5 % Final  . Platelet Count 07/12/2018 186  150 - 400 K/uL Final  . nRBC 07/12/2018 0.0  0.0 - 0.2 % Final  . Neutrophils Relative % 07/12/2018 58  % Final  . Neutro Abs 07/12/2018 4.6  1.7 - 7.7 K/uL Final  . Lymphocytes Relative 07/12/2018 30  % Final  . Lymphs Abs 07/12/2018 2.4  0.7 - 4.0 K/uL Final  . Monocytes Relative 07/12/2018 8  % Final  . Monocytes Absolute 07/12/2018 0.7  0.1 - 1.0 K/uL Final  . Eosinophils Relative 07/12/2018 3  % Final  . Eosinophils Absolute 07/12/2018 0.3  0.0 - 0.5 K/uL Final  . Basophils Relative 07/12/2018 1  % Final  . Basophils Absolute 07/12/2018 0.1  0.0 - 0.1 K/uL Final  . Immature Granulocytes 07/12/2018 0  % Final  . Abs Immature Granulocytes 07/12/2018 0.02  0.00 - 0.07 K/uL Final   Performed at [facility], [place] [address] [place], [place] [address]     Pathology Orders Placed This Encounter  Procedures  . CBC with Differential ([facility] Center Only)    Standing Status:   Future    Number of Occurrences:   1    Standing  Expiration Date:   07/12/2019  . CMP ([place] only)    Standing Status:   Future    Number of Occurrences:   1    Standing Expiration Date:   07/12/2019  . Lactate dehydrogenase (LDH)    Standing Status:   Future    Number of Occurrences:   1    Standing Expiration Date:   07/12/2019  . Ferritin    Standing Status:   Future    Number of Occurrences:   1    Standing Expiration Date:   07/12/2019  . Iron and TIBC    Standing Status:   Future    Number of Occurrences:   1    Standing Expiration Date:   07/12/2019  . Chromogranin A    Standing Status:   Future    Number of Occurrences:   1    Standing Expiration Date:   07/12/2019  . Folate, Serum    Standing Status:   Future    Number of Occurrences:   1    Standing Expiration Date:   07/12/2019  . Vitamin B12    Standing Status:  Future    Number of Occurrences:   1    Standing Expiration Date:   07/12/2019  . Methylmalonic acid, serum    Standing Status:   Future    Number of Occurrences:   1    Standing Expiration Date:   07/12/2019  . SPEP with reflex to IFE    Standing Status:   Future    Number of Occurrences:   1    Standing Expiration Date:   07/12/2019  . Hepatitis B surface antibody    Standing Status:   Future    Number of Occurrences:   1    Standing Expiration Date:   07/12/2019  . Hepatitis B surface antigen    Standing Status:   Future    Number of Occurrences:   1    Standing Expiration Date:   07/12/2019  . Hepatitis B core antibody, total    Standing Status:   Future    Number of Occurrences:   1    Standing Expiration Date:   07/12/2019  . Hepatitis C antibody    Standing Status:   Future    Number of Occurrences:   1    Standing Expiration Date:   07/12/2019  . HIV antibody (with reflex)    Standing Status:   Future    Number of Occurrences:   1    Standing Expiration Date:   07/12/2019  . Rheumatoid factor    Standing Status:   Future    Number of Occurrences:   1    Standing Expiration Date:    07/12/2019  . Sedimentation rate    Standing Status:   Future    Number of Occurrences:   1    Standing Expiration Date:   07/12/2019  . ANA, IFA (with reflex)    Standing Status:   Future    Number of Occurrences:   1    Standing Expiration Date:   07/12/2019  . Tryptase    Standing Status:   Future    Number of Occurrences:   1    Standing Expiration Date:   07/12/2019  . Miscellaneous [facility] test (send-out)    Standing Status:   Future    Number of Occurrences:   1    Standing Expiration Date:   07/12/2019    Order Specific Question:   Test name / description:    Answer:   kit mutational analysis for mastocytosis  . Hemochromatosis DNA, PCR    Standing Status:   Future    Number of Occurrences:   1    Standing Expiration Date:   07/12/2019       Zoila Shutter MD

## 2018-07-13 LAB — HEPATITIS C ANTIBODY: HCV Ab: <0.1 s/co ratio (ref 0.0–0.9)

## 2018-07-13 LAB — HEPATITIS B CORE ANTIBODY, TOTAL: Hep B Core Total Ab: NEGATIVE

## 2018-07-13 LAB — HIV ANTIBODY (ROUTINE TESTING W REFLEX): HIV Screen 4th Generation wRfx: NONREACTIVE

## 2018-07-13 LAB — RHEUMATOID FACTOR: Rheumatoid fact SerPl-aCnc: 10.1 IU/mL (ref 0.0–13.9)

## 2018-07-13 LAB — HEPATITIS B SURFACE ANTIBODY,QUALITATIVE: Hep B S Ab: REACTIVE

## 2018-07-13 LAB — HEPATITIS B SURFACE ANTIGEN: Hepatitis B Surface Ag: NEGATIVE

## 2018-07-14 LAB — TRYPTASE: Tryptase: 18.1 ug/L — ABNORMAL HIGH (ref 2.2–13.2)

## 2018-07-15 LAB — ANTINUCLEAR ANTIBODIES, IFA: ANA Ab, IFA: NEGATIVE

## 2018-07-15 LAB — PROTEIN ELECTROPHORESIS, SERUM, WITH REFLEX
A/G Ratio: 1.3 (ref 0.7–1.7)
Albumin ELP: 3.9 g/dL (ref 2.9–4.4)
Alpha-1-Globulin: 0.2 g/dL (ref 0.0–0.4)
Alpha-2-Globulin: 1 g/dL (ref 0.4–1.0)
Beta Globulin: 1 g/dL (ref 0.7–1.3)
Gamma Globulin: 0.8 g/dL (ref 0.4–1.8)
Globulin, Total: 3 g/dL (ref 2.2–3.9)
Total Protein ELP: 6.9 g/dL (ref 6.0–8.5)

## 2018-07-15 LAB — CHROMOGRANIN A: Chromogranin A (ng/mL): 563.6 ng/mL — ABNORMAL HIGH (ref 0.0–101.8)

## 2018-07-15 LAB — IRON AND TIBC
Iron: 64 ug/dL (ref 41–142)
Saturation Ratios: 19 % — ABNORMAL LOW (ref 21–57)
TIBC: 341 ug/dL (ref 236–444)
UIBC: 277 ug/dL (ref 120–384)

## 2018-07-15 LAB — FERRITIN: Ferritin: 183 ng/mL (ref 11–307)

## 2018-07-16 LAB — METHYLMALONIC ACID, SERUM: Methylmalonic Acid, Quantitative: 150 nmol/L (ref 0–378)

## 2018-07-18 ENCOUNTER — Encounter: Payer: Medicare Other | Admitting: Internal Medicine

## 2018-07-19 LAB — HEMOCHROMATOSIS DNA-PCR(C282Y,H63D)

## 2018-07-26 LAB — MISC LABCORP TEST (SEND OUT): Labcorp test code: 480940

## 2018-07-29 DIAGNOSIS — H04123 Dry eye syndrome of bilateral lacrimal glands: Secondary | ICD-10-CM | POA: Diagnosis not present

## 2018-07-31 ENCOUNTER — Telehealth: Payer: Self-pay | Admitting: Internal Medicine

## 2018-07-31 NOTE — Telephone Encounter (Signed)
Scheduled appt per 7/1 sch message- pt aware of appt date and time   

## 2018-08-08 ENCOUNTER — Other Ambulatory Visit: Payer: Self-pay

## 2018-08-08 ENCOUNTER — Inpatient Hospital Stay: Payer: Medicare Other | Attending: Internal Medicine | Admitting: Internal Medicine

## 2018-08-08 ENCOUNTER — Inpatient Hospital Stay: Payer: Medicare Other

## 2018-08-08 VITALS — BP 140/70 | HR 99 | Temp 98.5°F | Resp 18 | Ht 60.0 in | Wt 185.8 lb

## 2018-08-08 DIAGNOSIS — Z87891 Personal history of nicotine dependence: Secondary | ICD-10-CM | POA: Diagnosis not present

## 2018-08-08 DIAGNOSIS — E785 Hyperlipidemia, unspecified: Secondary | ICD-10-CM | POA: Diagnosis not present

## 2018-08-08 DIAGNOSIS — I10 Essential (primary) hypertension: Secondary | ICD-10-CM | POA: Insufficient documentation

## 2018-08-08 DIAGNOSIS — R232 Flushing: Secondary | ICD-10-CM | POA: Diagnosis not present

## 2018-08-08 DIAGNOSIS — R899 Unspecified abnormal finding in specimens from other organs, systems and tissues: Secondary | ICD-10-CM | POA: Diagnosis not present

## 2018-08-08 DIAGNOSIS — R197 Diarrhea, unspecified: Secondary | ICD-10-CM | POA: Insufficient documentation

## 2018-08-08 DIAGNOSIS — R634 Abnormal weight loss: Secondary | ICD-10-CM | POA: Insufficient documentation

## 2018-08-08 DIAGNOSIS — R140 Abdominal distension (gaseous): Secondary | ICD-10-CM | POA: Diagnosis not present

## 2018-08-08 DIAGNOSIS — Z79899 Other long term (current) drug therapy: Secondary | ICD-10-CM | POA: Diagnosis not present

## 2018-08-08 DIAGNOSIS — M1990 Unspecified osteoarthritis, unspecified site: Secondary | ICD-10-CM | POA: Insufficient documentation

## 2018-08-08 DIAGNOSIS — K219 Gastro-esophageal reflux disease without esophagitis: Secondary | ICD-10-CM | POA: Insufficient documentation

## 2018-08-08 DIAGNOSIS — R0602 Shortness of breath: Secondary | ICD-10-CM | POA: Insufficient documentation

## 2018-08-08 DIAGNOSIS — Z7982 Long term (current) use of aspirin: Secondary | ICD-10-CM | POA: Insufficient documentation

## 2018-08-08 DIAGNOSIS — K5900 Constipation, unspecified: Secondary | ICD-10-CM | POA: Insufficient documentation

## 2018-08-08 DIAGNOSIS — R933 Abnormal findings on diagnostic imaging of other parts of digestive tract: Secondary | ICD-10-CM | POA: Diagnosis not present

## 2018-08-08 NOTE — Progress Notes (Signed)
Diagnosis Abnormal laboratory test - Plan: 24 Hr Ur 5 HIAA Qnt, 24 Hr Urine, Metanephrine, 24 Hr Urine, Catecholamines, fractionated  Abdominal distension (gaseous) - Plan: CT Abdomen Pelvis W Contrast, CT Chest W Contrast  Abnormal findings on diagnostic imaging of other parts of digestive tract - Plan: CT Abdomen Pelvis W Contrast, CT Chest W Contrast  Abnormal weight loss - Plan: CT Abdomen Pelvis W Contrast, CT Chest W Contrast  Shortness of breath - Plan: CT Chest W Contrast  Flushing - Plan: 24 Hr Ur 5 HIAA Qnt, 24 Hr Urine, Metanephrine, 24 Hr Urine, Catecholamines, fractionated  Staging Cancer Staging No matching staging information was found for the patient.  Assessment and Plan:  1.  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  73 year old female referred for evaluation due to flushing, itching and elevated tryptase level.  Pt has been been by allergy for evaluation.  She reports symptoms began in March 2020. She took a course of prednisone with complete resolution of her symptoms. She takes Claritin-D and benadryl which also improves symptoms.  She denies any changes with her medications or detergents.  She denies wheezes. She denies skin cancer history.  She denies skin biopsies.  She has abdominal bloating and pain.  She also reports constipation and diarrhea.  She does have underlying arthritis and treats this with CBD oil twice daily.  She she denies any new medications.    Pt had tryptase level done on 06/18/2018 and was elevated at 20.2.  She had tryptase level repeated on 06/28/2018 and level was again elevated at 21.2.  Pt reports she was not taking Benadryl or Claritin at the time so she had symptoms when labs were drawn.    Pt had USN of the abdomen done 03/11/2018 that showed IMPRESSION: 1. 1.9 cm gallstone. No ultrasound findings to suggest acute cholecystitis. 2. Liver of increased echogenicity consistent with fatty infiltration and/or  hepatocellular disease. No focal hepatic lesion noted.  When questioned she drinks alcohol daily.    Labs done 07/12/2018 reviewed and showed WBC 7.9 HB 13.2 plts 186,000.  Chemistries showed K+ 4.3 Cr 1.18 AST 26 and ALT sightly elevated at 46.  Hep panel negative except for + Hep B SAB.  HIV negative.  Ferritin 183, TS 19, folate, B12, MMA WNL.  SPEP negative.  Tryptase 18.  Sed rate 28.  CHR A level elevated at 564.  Hemachromatosis gene evaluation is negative.    Kit mutational analysis shows no kit mutation.  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    Long talk held with pt in clinic and her husband on phone today.  Based on the normal CBC and negative Kit mutation analysis as well as tryptase level < 20 findings do not currently appear consistent with mastocytosis.    I discussed with her due to elevated CHR A level, she is recommended for CT CAP and 24 hour urine for 5HIAA.  Included in the differential is also pheochromocytoma.  Pt will undergo 24 hour urine for metanephrines and cathecholamines.  She will have phone visit follow-up to go over results.  Further recommendations pending scan and urine results.  Will also speak with GI regarding scan results.  All questions answered.    2.  GI symptoms.  Pt reports abdominal bloating, diarrhea and constipation.  Pt reports daily alcohol use.  Pt has USN abdomen done 03/11/2018 that showed fatty changes or findings that may be consistent with HC disease.  Pt has negative hepatitis panel other than + Hb B S AB which is likely due to immunization.  CHR A level is elevated at 564.  Pt is set up for CT CAP and 24 hour urine for 5 HIAA and metanephrines and cathecholamines.  Pt will have phone visit follow-up in 2 weeks to go over results.  Will discuss scan finding with GI.   Pt reportedly had recent GI evaluation.    3.  Flushing and hives  Pt has been seen by allergy.  She reports hives improved with Allegra.   Chr A level to  evaluate for possible carcinoid was elevated at 564.  Awaiting results of 24 hour urine 5 HIAA and CT scan.  Pt will have phone follow-up to go over results.    4.  Joint pain.  Pt reports history of arthritis.  Pt has negaitve ANA, RF, SPEP.  Sed rate mildly increased at 28. Follow-up with PCP if ongoing symptoms.     5.  Hypertension.  BP is 140/70.   Follow-up with PCP for monitoring.    6.  Health maintenance.  Mammogram and GI follow-up as recommended.    25  minutes spent with more than 50% spent in review of records, counseling and coordination of care.    Interval History:  Historical data obtained from note dated 07/12/2018.  73 year old female referred for evaluation due to flushing, itching and elevated tryptase level and suspicion for mastocytosis.  Pt has been been by allergy for evaluation.  She reports symptoms began in March 2020. She took a course of prednisone with complete resolution of her symptoms. She takes Claritin-D and benadryl which also improves symptoms.  She denies any changes with her medications or detergents.  She denies wheezes.  She denies skin cancer history.  She denies skin biopsies.   She has abdominal bloating and pain.  She also reports constipation and diarrhea.  She does have underlying arthritis and treats this with CBD oil twice daily.  She she denies any new medications.    Pt had tryptase level done on 06/18/2018 and was elevated at 20.2.  She had tryptase level repeated on 06/28/2018 and level was again elevated at 21.2.  Pt reports she was not taking Benadryl or Claritin at the time so she had symptoms when labs were drawn.    Pt had USN of the abdomen done 03/11/2018 that showed IMPRESSION: 1. 1.9 cm gallstone. No ultrasound findings to suggest acute cholecystitis. 2. Liver of increased echogenicity consistent with fatty infiltration and/or hepatocellular disease. No focal hepatic lesion noted.  When questioned she drinks alcohol daily.    Current  Status:  Pt is seen today for follow-up.  She is here to go over labs.  She reports hives are improved with Allegra.  She continues to have right sided abdominal pain and bloating.  She continues to drink alcohol daily.     Oncology History   No history exists.     Problem List Patient Active Problem List   Diagnosis Date Noted  . Cholecystitis with cholelithiasis [K80.10] 03/22/2018  . S/P lumbar fusion [Z98.1] 03/07/2017  . Hematoma of arm [S40.029A] 08/24/2015  . Coronary artery disease involving native coronary artery of native heart without angina pectoris [I25.10]   . Dyslipidemia [E78.5] 08/22/2015  . Heart murmur [R01.1]   . History of ST elevation myocardial infarction (STEMI) [I25.2] 08/21/2015  . Appendicitis, acute [K35.80] 08/14/2013  . S/P laparoscopic appendectomy [Z90.49] 08/13/2013  . Neuropathy [  G62.9] 11/11/2012  . Hypertension [I10] 01/17/2011    Past Medical History Past Medical History:  Diagnosis Date  . Anemia   . Arthritis 01-17-11   degenerative disc disease-all joints  . Bursitis   . Cholecystitis with cholelithiasis 03/22/2018  . Cholesterol serum elevated 01-17-11   tx. meds  . GERD (gastroesophageal reflux disease) 01-17-11   tx. omeprazole  . Heart murmur    slight murmur  . High cholesterol   . Hypertension 01-17-11   tx. meds  . Hypothyroidism   . Leg pain    ABIs 2/18: normal bilaterally  . Neuropathy    FEET  . Osteoarthritis   . Peripheral neuropathy   . Seasonal allergies   . Sinus problem 01-17-11   tx. Claritin D  . ST elevation (STEMI) myocardial infarction involving left circumflex coronary artery ([place]) 08/21/2015    Past Surgical History Past Surgical History:  Procedure Laterality Date  . ABDOMINAL HYSTERECTOMY  01-17-11   '93-Hysterectomy-heavy bleeding  . ANTERIOR LATERAL LUMBAR FUSION WITH PERCUTANEOUS SCREW 1 LEVEL N/A 03/07/2017   Procedure: XLIF L3-4;  Surgeon: Melina Schools, MD;  Location: [facility];  Service:  Orthopedics;  Laterality: N/A;  4 hrs  . APPENDECTOMY    . BACK SURGERY  01-17-11   04-20-10-T-Lift lumbar fusion  . [facility] PROCEDURE  01-17-11   Bladder sling  . CARDIAC CATHETERIZATION N/A 08/21/2015   Procedure: Left Heart Cath and Coronary Angiography;  Surgeon: Sherren Mocha, MD;  Location: [facility];  Service: Cardiovascular;  Laterality: N/A;  . CARDIAC CATHETERIZATION N/A 08/21/2015   Procedure: Coronary Stent Intervention;  Surgeon: Sherren Mocha, MD;  Location: [facility];  Service: Cardiovascular;  Laterality: N/A;  . CARDIAC CATHETERIZATION N/A 08/23/2015   Procedure: Coronary Stent Intervention;  Surgeon: Leonie Man, MD;  Location: [place] [facility];  Service: Cardiovascular;  Laterality: N/A;  promus 2.5x16 in RCA  . CARDIAC CATHETERIZATION N/A 08/23/2015   Procedure: Left Heart Cath and Coronary Angiography;  Surgeon: Leonie Man, MD;  Location: [place] [facility];  Service: Cardiovascular;  Laterality: N/A;  . CARPAL TUNNEL RELEASE  01-17-11   '02-left, also right  . CHOLECYSTECTOMY N/A 03/22/2018   Procedure: LAPAROSCOPIC CHOLECYSTECTOMY;  Surgeon: Fanny Skates, MD;  Location: [facility];  Service: General;  Laterality: N/A;  . COLONOSCOPY    . HARDWARE REMOVAL Right 05/15/2013   Procedure: REMOVAL RIGHT L4-L5 PEDICLE SCREWS AND ROD ;  Surgeon: Melina Schools, MD;  Location: [employer];  Service: Orthopedics;  Laterality: Right;  . JOINT REPLACEMENT  01-17-11    RTKA' 02  . LAPAROSCOPIC APPENDECTOMY N/A 08/13/2013   Procedure: APPENDECTOMY LAPAROSCOPIC;  Surgeon: Pedro Earls, MD;  Location: [facility];  Service: General;  Laterality: N/A;  . TONSILLECTOMY  01-17-11   child  . TOTAL KNEE ARTHROPLASTY  01/19/2011   Procedure: TOTAL KNEE ARTHROPLASTY;  Surgeon: Johnn Hai;  Location: [facility];  Service: Orthopedics;  Laterality: Left;    Family History Family History  Problem Relation Age of Onset  . Aneurysm Mother   . Heart disease Father   . Heart  disease Brother   . Neuropathy Neg Hx   . Urticaria Neg Hx   . Allergic rhinitis Neg Hx   . Asthma Neg Hx      Social History  reports that she quit smoking about 35 years ago. Her smoking use included cigarettes. She has a 19.00 pack-year smoking history. She has never used smokeless tobacco. She  reports current alcohol use of about 2.0 standard drinks of alcohol per week. She reports that she does not use drugs.  Medications  Current Outpatient Medications:  .  aspirin EC 81 MG tablet, Take 81 mg by mouth at bedtime. , Disp: , Rfl:  .  benazepril (LOTENSIN) 20 MG tablet, Take 20 mg by mouth 2 (two) times daily. , Disp: , Rfl:  .  Calcium Carbonate-Vitamin D (CALCIUM 600 + D PO), Take 1 tablet by mouth 2 (two) times daily.  , Disp: , Rfl:  .  cetirizine (ZYRTEC) 10 MG tablet, (20 mg) Two tablets in the evening daily (Patient taking differently: One tablet in the evening daily), Disp: 60 tablet, Rfl: 5 .  Cholecalciferol (VITAMIN D) 2000 units CAPS, Take 2,000 Units by mouth daily., Disp: , Rfl:  .  diphenhydramine-acetaminophen (TYLENOL PM) 25-500 MG TABS tablet, Take 3 tablets by mouth every morning., Disp: , Rfl:  .  docusate sodium (COLACE) 100 MG capsule, Take 100 mg by mouth 2 (two) times daily. , Disp: , Rfl:  .  ezetimibe (ZETIA) 10 MG tablet, TAKE 1 TABLET BY MOUTH EVERY DAY, Disp: 90 tablet, Rfl: 3 .  ferrous sulfate 325 (65 FE) MG tablet, Take 325 mg by mouth daily with breakfast., Disp: , Rfl:  .  fexofenadine (ALLEGRA) 180 MG tablet, Take 2 tablets every morning. (Patient taking differently: Take 1 tablet every morning.), Disp: 60 tablet, Rfl: 5 .  fish oil-omega-3 fatty acids 1000 MG capsule, Take 1 g by mouth every evening. , Disp: , Rfl:  .  gabapentin (NEURONTIN) 600 MG tablet, Take 600 mg by mouth 2 (two) times daily., Disp: , Rfl:  .  Glucosamine-Chondroit-Vit C-Mn (GLUCOSAMINE CHONDR 1500 COMPLX PO), Take 2 tablets by mouth daily., Disp: , Rfl:  .  levothyroxine  (SYNTHROID, LEVOTHROID) 75 MCG tablet, Take 75 mcg by mouth daily before breakfast. , Disp: , Rfl:  .  Liniments (SALONPAS PAIN RELIEF PATCH EX), Place 1 patch onto the skin daily as needed (pain)., Disp: , Rfl:  .  loratadine-pseudoephedrine (CLARITIN-D 24-HOUR) 10-240 MG per 24 hr tablet, Take 1 tablet by mouth every evening. , Disp: , Rfl:  .  meclizine (ANTIVERT) 25 MG tablet, Take 25 mg by mouth 3 (three) times daily as needed for dizziness., Disp: , Rfl:  .  Melatonin 5 MG CAPS, Take 5 mg by mouth at bedtime. , Disp: , Rfl:  .  metoprolol tartrate (LOPRESSOR) 25 MG tablet, TAKE ONE-HALF TABLET BY MOUTH 2 (TWO) TIMES DAILY. (Patient taking differently: Take 12.5 mg by mouth 2 (two) times daily. ), Disp: 90 tablet, Rfl: 3 .  NON FORMULARY, Place 1 drop under the tongue every morning. CBD Oil, Disp: , Rfl:  .  omeprazole (PRILOSEC) 20 MG capsule, Take 20 mg by mouth daily with breakfast. , Disp: , Rfl:  .  polyethylene glycol (MIRALAX / GLYCOLAX) packet, Take 17 g by mouth daily as needed for mild constipation. , Disp: , Rfl:  .  potassium chloride (K-DUR,KLOR-CON) 10 MEQ tablet, Take 10 mEq by mouth daily. , Disp: , Rfl:  .  rosuvastatin (CRESTOR) 40 MG tablet, Take 40 mg by mouth at bedtime. , Disp: , Rfl: 2 .  triamterene-hydrochlorothiazide (MAXZIDE-25) 37.5-25 MG tablet, Take 1 tablet by mouth daily., Disp: , Rfl: 1  Allergies Penicillins and Pravastatin  Review of Systems Review of Systems - Oncology ROS negative other than abdominal bloating and RUQ pain.     Physical Exam  Vitals Wt  Readings from Last 3 Encounters:  08/08/18 185 lb 12.8 oz (84.3 kg)  07/12/18 185 lb 11.2 oz (84.2 kg)  06/18/18 181 lb 3.2 oz (82.2 kg)   Temp Readings from Last 3 Encounters:  08/08/18 98.5 F (36.9 C) (Oral)  07/12/18 (!) 97.5 F (36.4 C) (Oral)  06/18/18 98 F (36.7 C) (Temporal)   BP Readings from Last 3 Encounters:  08/08/18 140/70  07/12/18 140/67  06/18/18 (!) 142/64   Pulse  Readings from Last 3 Encounters:  08/08/18 99  07/12/18 86  06/18/18 85   Constitutional: Well-developed, well-nourished, and in no distress.   HENT: Head: Normocephalic and atraumatic.  Mouth/Throat: No oropharyngeal exudate. Mucosa moist. Eyes: Pupils are equal, round, and reactive to light. Conjunctivae are normal. No scleral icterus.  Neck: Normal range of motion. Neck supple. No JVD present.  Cardiovascular: Normal rate, regular rhythm and normal heart sounds.  Exam reveals no gallop and no friction rub.   No murmur heard. Pulmonary/Chest: Effort normal and breath sounds normal. No respiratory distress. No wheezes.No rales.  Abdominal: Soft. Bowel sounds are normal. Mild distention noted with tenderness in RUQ. There is no guarding.  Musculoskeletal: No edema or tenderness.  Lymphadenopathy: No cervical, axillary or supraclavicular adenopathy.  Neurological: Alert and oriented to person, place, and time. No cranial nerve deficit.  Skin: Skin is warm and dry. No rash noted. No erythema. No pallor.  Psychiatric: Affect and judgment normal.   Labs No visits with results within 3 Day(s) from this visit.  Latest known visit with results is:  Appointment on 07/12/2018  Component Date Value Ref Range Status  . DNA Mutation Analysis 07/12/2018 Comment   Final   Comment: (NOTE) NO MUTATION IDENTIFIED Interpretation: This patient's sample was analyzed for the hereditary hemochromatosis (HH) mutations C282Y, H63D, S65C. No mutation was identified. The mutations analyzed by [facility] are most common in the Caucasian population, and up to 90% of affected Caucasians will have a positive test result. Because this panel does not identify rare HH mutations or HH mutations found in other ethnic groups, there are a small number of people who may have a negative test but may actually be affected. The diagnosis of HH should include clinical findings and other test results such  as transferrin-iron saturation and/or serum ferritin studies and/or liver biopsy.  If this patient has a history of HH, in many cases a specific carrier risk can be determined based on this negative result. Methodology: DNA Analysis of the HFE gene was performed by PCR amplification followed by restriction enzyme digestion analyses. Reference: Cathe Mons and [place] AP. (20                          00). Genet Test 4:97-101. Cogswell ME et al. (1999). AM J Prev Med 16:134-140. Bomford A (2002). Lancet 360(9346):1673-81. Stan Head et al. (2002). Blood Cells, Molecules. and Diseases.  29(3):418-432. Imperatore G et al. (2003). Genet Med. 5(1):1-8. Cogswell ME et al. (2003). Genet Med. 5(4):304-10. This test was developed and its performance characteristics determined by [facility]. It has not been cleared or approved by the Food and Drug Administration. Genetic counselors are available for health care providers to discuss results at 1-800-345-GENE. Allison Quarry, PhD, [facility] Ruben Reason, PhD, [facility] Annetta Maw, M.S., PhD, [facility] Alfredo Bach, PhD, [facility] Norva Riffle, PhD, [facility] Earlean Polka, PhD, [facility] Performed At: [facility] [address] [place], [place] [id number] Katina Degree MDPhD NA:[id number]   .  [facility] test code 07/12/2018 [id number]   Corrected   CORRECTED ON 06/12 AT 1440: PREVIOUSLY REPORTED AS kit mutational analisis for mastocytosis  . [facility] test name 07/12/2018 kit mutation analysis   Corrected   Comment: (NOTE) c-KIT mutation analysis in tumors of hematopoietic Performed at [facility], [place] [address], [place], [place] [address] CORRECTED ON 06/12 AT 1440: PREVIOUSLY REPORTED AS for mastocytosis   . Misc [facility] result 07/12/2018 COMMENT   Final   Comment: (NOTE) Performed At: [facility] [place], [place] [id number] Rush Farmer MD TD:[id number]   . Tryptase 07/12/2018 18.1* 2.2 - 13.2 ug/L  Final   Comment: (NOTE) Performed At: [facility] [address] [place], [place] [id number] Rush Farmer MD OE:[id number]   . ANA Ab, IFA 07/12/2018 Negative   Final   Comment: (NOTE)                                     Negative   <1:80                                     Borderline  1:80                                     Positive   >1:80 Performed At: [facility] [place], [place] [id number] Rush Farmer MD CV:[id number]   . Sed Rate 07/12/2018 28* 0 - 22 mm/hr Final   Performed at [facility], [place] [address] [place], [place] [address]  . Rhuematoid fact SerPl-aCnc 07/12/2018 10.1  0.0 - 13.9 IU/mL Final   Comment: (NOTE) Performed At: [facility] [place], [place] [id number] Rush Farmer MD TI:[id number]   . HIV Screen 4th Generation wRfx 07/12/2018 Non Reactive  Non Reactive Final   Comment: (NOTE) Performed At: [facility] [place], [place] [id number] Rush Farmer MD OI:[id number]   . HCV Ab 07/12/2018 <0.1  0.0 - 0.9 s/co ratio Final   Comment: (NOTE)                                  Negative:     < 0.8                             Indeterminate: 0.8 - 0.9                                  Positive:     > 0.9 The CDC recommends that a positive HCV antibody result be followed up with a HCV Nucleic Acid Amplification test (338250). Performed At: [facility] [place], [place] [id number] Rush Farmer MD PF:[id number]   . Hep B Core Total Ab 07/12/2018 Negative  Negative Final   Comment: (NOTE) Performed At: [facility] [place], [place] [id number] Rush Farmer MD TM:[id number]   . Hepatitis B Surface Ag 07/12/2018 Negative  Negative Final   Comment: (NOTE) Performed At: [facility] [place], [place] [id number] Rush Farmer MD  YJ:[id number]   . Hep B S Ab 07/12/2018 Reactive   Final    Comment: (NOTE)              Non Reactive: Inconsistent with immunity,                            less than 10 mIU/mL              Reactive:     Consistent with immunity,                            greater than 9.9 mIU/mL Performed At: [facility] [place], [place] [id number] Rush Farmer MD YD:[id number]   . Total Protein ELP 07/12/2018 6.9  6.0 - 8.5 g/dL Final  . Albumin ELP 07/12/2018 3.9  2.9 - 4.4 g/dL Final  . [place]-1-Globulin 07/12/2018 0.2  0.0 - 0.4 g/dL Final  . [place]-2-Globulin 07/12/2018 1.0  0.4 - 1.0 g/dL Final  . Beta Globulin 07/12/2018 1.0  0.7 - 1.3 g/dL Final  . Gamma Globulin 07/12/2018 0.8  0.4 - 1.8 g/dL Final  . M-Spike, % 07/12/2018 Not Observed  Not Observed g/dL Final  . Globulin, Total 07/12/2018 3.0  2.2 - 3.9 g/dL Corrected  . A/G Ratio 07/12/2018 1.3  0.7 - 1.7 Corrected  . Comment 07/12/2018 Comment   Corrected   Comment: (NOTE) Protein electrophoresis scan will follow via computer, mail, or courier delivery.   Marland Kitchen SPEP Interpretation 07/12/2018 Comment   Final   Comment: (NOTE) The SPE pattern appears unremarkable. Evidence of monoclonal protein is not apparent. Performed At: [facility] [place], [place] [id number] Rush Farmer MD GE:[id number]   . Methylmalonic Acid, Quantitative 07/12/2018 150  0 - 378 nmol/L Final  . Disclaimer: 07/12/2018 Comment   Final   Comment: (NOTE) This test was developed and its performance characteristics determined by [facility]. It has not been cleared or approved by the Food and Drug Administration. Performed At: [facility] [place], [place] [id number] Rush Farmer MD CL:[id number]   . Vitamin B-12 07/12/2018 205  180 - 914 pg/mL Final   Comment: (NOTE) This assay is not validated for testing neonatal or myeloproliferative syndrome specimens for Vitamin B12 levels. Performed at [facility], [place] [address] [place], [place] [address]   . Folate 07/12/2018 9.4  >5.9 ng/mL Final   Performed at [place] [address], [place], [place] [address]  . Chromogranin A (ng/mL) 07/12/2018 563.6* 0.0 - 101.8 ng/mL Final   Comment: (NOTE) Results of this test are labeled for research purposes only by the assay's manufacturer. The performance characteristics of this assay have not been established by the manufacturer. The result should not be used for treatment or for diagnostic purposes without confirmation of the diagnosis by another medically established diagnostic product or procedure. The performance characteristics were determined by [facility]. Chromogranin A performed by Thermofisher/BRAHMS KRYPTOR methodology Values obtained with different assay methods or kits cannot be used interchangeably. Performed At: [facility] [place], [place] [id number] Rush Farmer MD TT:[id number]   . Iron 07/12/2018 64  41 - 142 ug/dL Final  . TIBC 07/12/2018 341  236 - 444 ug/dL Final  . Saturation Ratios 07/12/2018 19* 21 - 57 % Final  . UIBC 07/12/2018 277  120 - 384 ug/dL Final   Performed at  [facility], [place] [address], [place], [place] [address]  . Ferritin 07/12/2018 183  11 - 307 ng/mL Final   Performed at [facility], [place] [address] [place], [place] [address]  . LDH 07/12/2018 184  98 - 192 U/L Final   Performed at [facility], [place] [address], [place], [place] [address]  . Sodium 07/12/2018 136  135 - 145 mmol/L Final  . Potassium 07/12/2018 4.3  3.5 - 5.1 mmol/L Final  . Chloride 07/12/2018 99  98 - 111 mmol/L Final  . CO2 07/12/2018 24  22 - 32 mmol/L Final  . Glucose, Bld 07/12/2018 101* 70 - 99 mg/dL Final  . BUN 07/12/2018 26* 8 - 23 mg/dL Final  . Creatinine 07/12/2018 1.18* 0.44 - 1.00 mg/dL Final  . Calcium 07/12/2018 9.7  8.9 - 10.3 mg/dL Final  . Total Protein  07/12/2018 7.5  6.5 - 8.1 g/dL Final  . Albumin 07/12/2018 4.2  3.5 - 5.0 g/dL Final  . AST 07/12/2018 26  15 - 41 U/L Final  . ALT 07/12/2018 46* 0 - 44 U/L Final  . Alkaline Phosphatase 07/12/2018 103  38 - 126 U/L Final  . Total Bilirubin 07/12/2018 0.3  0.3 - 1.2 mg/dL Final  . GFR, Est Non Af Am 07/12/2018 46* >60 mL/min Final  . GFR, Est AFR Am 07/12/2018 53* >60 mL/min Final  . Anion gap 07/12/2018 13  5 - 15 Final   Performed at [facility], [place] [address], [place], [place] [address]  . WBC Count 07/12/2018 7.9  4.0 - 10.5 K/uL Final  . RBC 07/12/2018 4.22  3.87 - 5.11 MIL/uL Final  . Hemoglobin 07/12/2018 13.2  12.0 - 15.0 g/dL Final  . HCT 07/12/2018 39.8  36.0 - 46.0 % Final  . MCV 07/12/2018 94.3  80.0 - 100.0 fL Final  . MCH 07/12/2018 31.3  26.0 - 34.0 pg Final  . MCHC 07/12/2018 33.2  30.0 - 36.0 g/dL Final  . RDW 07/12/2018 12.7  11.5 - 15.5 % Final  . Platelet Count 07/12/2018 186  150 - 400 K/uL Final  . nRBC 07/12/2018 0.0  0.0 - 0.2 % Final  . Neutrophils Relative % 07/12/2018 58  % Final  . Neutro Abs 07/12/2018 4.6  1.7 - 7.7 K/uL Final  . Lymphocytes Relative 07/12/2018 30  % Final  . Lymphs Abs 07/12/2018 2.4  0.7 - 4.0 K/uL Final  . Monocytes Relative 07/12/2018 8  % Final  . Monocytes Absolute 07/12/2018 0.7  0.1 - 1.0 K/uL Final  . Eosinophils Relative 07/12/2018 3  % Final  . Eosinophils Absolute 07/12/2018 0.3  0.0 - 0.5 K/uL Final  . Basophils Relative 07/12/2018 1  % Final  . Basophils Absolute 07/12/2018 0.1  0.0 - 0.1 K/uL Final  . Immature Granulocytes 07/12/2018 0  % Final  . Abs Immature Granulocytes 07/12/2018 0.02  0.00 - 0.07 K/uL Final   Performed at [facility], [place] [address], [place], [place] [address]     Pathology Orders Placed This Encounter  Procedures  . CT Abdomen Pelvis W Contrast    Standing Status:   Future    Standing Expiration Date:   08/08/2019    Order Specific Question:    ** REASON FOR EXAM (FREE TEXT)    Answer:   abnormal USN, elevated chromagrin a    Order Specific Question:   If indicated for the  ordered procedure, I authorize the administration of contrast media per Radiology protocol    Answer:   Yes    Order Specific Question:   Preferred imaging location?    Answer:   [facility]    Order Specific Question:   Is Oral Contrast requested for this exam?    Answer:   Yes, Per Radiology protocol    Order Specific Question:   Radiology Contrast Protocol - do NOT remove file path    Answer:   \\[email or web address].[email or web address]  . CT Chest W Contrast    Standing Status:   Future    Standing Expiration Date:   08/08/2019    Order Specific Question:   ** REASON FOR EXAM (FREE TEXT)    Answer:   elevated chromagranin A    Order Specific Question:   If indicated for the ordered procedure, I authorize the administration of contrast media per Radiology protocol    Answer:   Yes    Order Specific Question:   Preferred imaging location?    Answer:   [facility]    Order Specific Question:   Radiology Contrast Protocol - do NOT remove file path    Answer:   \\[email or web address].[email or web address]  . 24 Hr Ur 5 HIAA Qnt    Standing Status:   Future    Standing Expiration Date:   08/08/2019  . 24 Hr Urine, Metanephrine    Standing Status:   Future    Standing Expiration Date:   08/08/2019  . 24 Hr Urine, Catecholamines, fractionated    Standing Status:   Future    Standing Expiration Date:   08/08/2019       Zoila Shutter MD

## 2018-08-12 ENCOUNTER — Other Ambulatory Visit: Payer: Self-pay | Admitting: *Deleted

## 2018-08-12 ENCOUNTER — Telehealth: Payer: Self-pay | Admitting: Internal Medicine

## 2018-08-12 DIAGNOSIS — R232 Flushing: Secondary | ICD-10-CM | POA: Diagnosis not present

## 2018-08-12 DIAGNOSIS — R899 Unspecified abnormal finding in specimens from other organs, systems and tissues: Secondary | ICD-10-CM | POA: Diagnosis not present

## 2018-08-12 DIAGNOSIS — R933 Abnormal findings on diagnostic imaging of other parts of digestive tract: Secondary | ICD-10-CM | POA: Diagnosis not present

## 2018-08-12 DIAGNOSIS — R634 Abnormal weight loss: Secondary | ICD-10-CM | POA: Diagnosis not present

## 2018-08-12 DIAGNOSIS — R140 Abdominal distension (gaseous): Secondary | ICD-10-CM | POA: Diagnosis not present

## 2018-08-12 DIAGNOSIS — R0602 Shortness of breath: Secondary | ICD-10-CM | POA: Diagnosis not present

## 2018-08-12 NOTE — Telephone Encounter (Signed)
Called and spoke with patient. Confirmed upcoming appts. Advised patient to pick of contrast for CT

## 2018-08-14 LAB — METANEPHRINES, URINE, 24 HOUR
Metaneph Total, Ur: 43 ug/L
Metanephrines, 24H Ur: 97 ug/24 hr (ref 36–209)
Normetanephrine, 24H Ur: 902 ug/24 hr — ABNORMAL HIGH (ref 131–612)
Normetanephrine, Ur: 401 ug/L
Total Volume: 2250

## 2018-08-15 ENCOUNTER — Other Ambulatory Visit: Payer: Self-pay

## 2018-08-15 ENCOUNTER — Ambulatory Visit (HOSPITAL_COMMUNITY)
Admission: RE | Admit: 2018-08-15 | Discharge: 2018-08-15 | Disposition: A | Discharge status: Home / Self Care | Payer: Medicare Other | Source: Ambulatory Visit | Attending: Internal Medicine | Admitting: Internal Medicine

## 2018-08-15 DIAGNOSIS — R634 Abnormal weight loss: Secondary | ICD-10-CM | POA: Insufficient documentation

## 2018-08-15 DIAGNOSIS — R140 Abdominal distension (gaseous): Secondary | ICD-10-CM

## 2018-08-15 DIAGNOSIS — R933 Abnormal findings on diagnostic imaging of other parts of digestive tract: Secondary | ICD-10-CM | POA: Diagnosis not present

## 2018-08-15 DIAGNOSIS — R0602 Shortness of breath: Secondary | ICD-10-CM | POA: Diagnosis not present

## 2018-08-15 DIAGNOSIS — R918 Other nonspecific abnormal finding of lung field: Secondary | ICD-10-CM | POA: Diagnosis not present

## 2018-08-15 DIAGNOSIS — K760 Fatty (change of) liver, not elsewhere classified: Secondary | ICD-10-CM | POA: Diagnosis not present

## 2018-08-15 MED ORDER — SODIUM CHLORIDE (PF) 0.9 % IJ SOLN
INTRAMUSCULAR | Status: AC
  Filled 2018-08-15: qty 50

## 2018-08-15 MED ORDER — IOHEXOL 300 MG/ML  SOLN
100.0000 mL | Freq: Once | INTRAMUSCULAR | Status: AC | PRN
  Administered 2018-08-15: 100 mL via INTRAVENOUS

## 2018-08-16 LAB — 5 HIAA, QUANTITATIVE, URINE, 24 HOUR
5-HIAA, Ur: 2.4 mg/L
5-HIAA,Quant.,24 Hr Urine: 3.5 mg/24 hr (ref 0.0–14.9)
Total Volume: 1450

## 2018-08-22 ENCOUNTER — Inpatient Hospital Stay (HOSPITAL_BASED_OUTPATIENT_CLINIC_OR_DEPARTMENT_OTHER): Payer: Medicare Other | Admitting: Internal Medicine

## 2018-08-22 DIAGNOSIS — R899 Unspecified abnormal finding in specimens from other organs, systems and tissues: Secondary | ICD-10-CM

## 2018-08-22 DIAGNOSIS — I10 Essential (primary) hypertension: Secondary | ICD-10-CM | POA: Diagnosis not present

## 2018-08-22 DIAGNOSIS — D539 Nutritional anemia, unspecified: Secondary | ICD-10-CM | POA: Diagnosis not present

## 2018-08-22 DIAGNOSIS — R911 Solitary pulmonary nodule: Secondary | ICD-10-CM

## 2018-08-22 NOTE — Progress Notes (Signed)
Virtual Visit via Telephone Note  I connected with Gabrielle [name] on 08/22/18 at  2:40 PM EDT by telephone and verified that I am speaking with the correct person using two identifiers.   I discussed the limitations, risks, security and privacy concerns of performing an evaluation and management service by telephone and the availability of in person appointments. I also discussed with the patient that there may be a patient responsible charge related to this service. The patient expressed understanding and agreed to proceed.  Interval History. Historical data obtained from note dated 07/12/2018.  73 year old female referred for evaluation due to flushing, itching and elevated tryptase level and suspicion for mastocytosis.  Pt has been been by allergy for evaluation.  She reports symptoms began in March 2020. She took a course of prednisone with complete resolution of her symptoms. She takes Claritin-D and benadryl which also improves symptoms.  She denies any changes with her medications or detergents.  She denies wheezes.  She denies skin cancer history.  She denies skin biopsies.   She has abdominal bloating and pain.  She also reports constipation and diarrhea.  She does have underlying arthritis and treats this with CBD oil twice daily.  She she denies any new medications.    Pt had tryptase level done on 06/18/2018 and was elevated at 20.2.  She had tryptase level repeated on 06/28/2018 and level was again elevated at 21.2.  Pt reports she was not taking Benadryl or Claritin at the time so she had symptoms when labs were drawn.    Pt had USN of the abdomen done 03/11/2018 that showed IMPRESSION: 1. 1.9 cm gallstone. No ultrasound findings to suggest acute cholecystitis. 2. Liver of increased echogenicity consistent with fatty infiltration and/or hepatocellular disease. No focal hepatic lesion noted.  When questioned she drinks alcohol daily.      Observations/Objective: Review of urine  results.     Assessment and Plan:  1.  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  73 year old female referred for evaluation due to flushing, itching and elevated tryptase level.  Pt has been been by allergy for evaluation.  She reports symptoms began in March 2020. She took a course of prednisone with complete resolution of her symptoms. She takes Claritin-D and benadryl which also improves symptoms.  She denies any changes with her medications or detergents.  She denies wheezes. She denies skin cancer history.  She denies skin biopsies.  She has abdominal bloating and pain.  She also reports constipation and diarrhea.  She does have underlying arthritis and treats this with CBD oil twice daily.  She she denies any new medications.    Pt had tryptase level done on 06/18/2018 and was elevated at 20.2.  She had tryptase level repeated on 06/28/2018 and level was again elevated at 21.2.  Pt reports she was not taking Benadryl or Claritin at the time so she had symptoms when labs were drawn.    Pt had USN of the abdomen done 03/11/2018 that showed IMPRESSION: 1. 1.9 cm gallstone. No ultrasound findings to suggest acute cholecystitis. 2. Liver of increased echogenicity consistent with fatty infiltration and/or hepatocellular disease. No focal hepatic lesion noted.  When questioned she drinks alcohol daily.    Labs done 07/12/2018 showed WBC 7.9 HB 13.2 plts 186,000.  Chemistries showed K+ 4.3 Cr 1.18 AST 26 and ALT sightly elevated at 46.  Hep panel negative except for + Hep B SAB.  HIV negative.  Ferritin 183, TS 19, folate, B12, MMA WNL.  SPEP negative.  Tryptase 18.  Sed rate 28.  CHR A level elevated at 564.  Hemachromatosis gene evaluation is negative.    Kit mutational analysis shows no kit mutation.  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    Based on the normal CBC and negative Kit mutation analysis as well as tryptase level < 20 findings do  not currently appear consistent with mastocytosis.    I discussed with her due to elevated CHR A level, she underwent CT CAP on 08/15/2018 that was reviewed and showed IMPRESSION: 1. Several indeterminate left upper lobe pulmonary nodules measuring up to 9 mm. Non-contrast chest CT at 3-6 months is recommended. If the nodules are stable at time of repeat CT, then future CT at 18-24 months (from today's scan) is considered optional for low-risk patients, but is recommended for high-risk patients. 2. No other masses or lymphadenopathy identified. 3. Stable moderate hepatic steatosis. 4. Aortic and coronary artery atherosclerosis.  24 hour urine 5HIAA was normal at 3.5.  Metanephrines and Normetanephrines was 97 and 902.  Pt had no adrenal lesions on recent CT.    Pt is referred to Dr. Gahem of GI due to labs and abdominal symptoms to evaluate for any evidence of Carcinoid.  Pt is also referred to pulmonary due to pulmonary nodule.  She will RTC in 10/2018 for labs and follow-up.    2.  GI symptoms.  Pt reports abdominal bloating, diarrhea and constipation.  Pt reports daily alcohol use.  Pt has USN abdomen done 03/11/2018 that showed fatty changes or findings that may be consistent with HC disease.  Pt has negative hepatitis panel other than + Hb B S AB which is likely due to immunization.  CHR A level is elevated at 564.    CT CAP done 08/15/2018 reviewed and showed  IMPRESSION: 1. Several indeterminate left upper lobe pulmonary nodules measuring up to 9 mm. Non-contrast chest CT at 3-6 months is recommended. If the nodules are stable at time of repeat CT, then future CT at 18-24 months (from today's scan) is considered optional for low-risk patients, but is recommended for high-risk patients. 2. No other masses or lymphadenopathy identified. 3. Stable moderate hepatic steatosis. 4. Aortic and coronary artery atherosclerosis.  5HIAA level 3.5.  Pt referred to Dr. Ganem of GI for evaluation  for any evidence of carcinoid.  Will arrange follow-up once GI work-up completed.    3.  Pulmonary nodule.  Pt had small indeterminate nodules on CT chest done 08/15/2018.  Pt has smoking history.  She is referred to pulmonary for evaluation.    4.  Flushing and hives  Pt has been seen by allergy.  She reports hives improved with Allegra.   Chr A level to evaluate for possible carcinoid was elevated at 564.    5.  Joint pain.  Pt reports history of arthritis.  Pt has negaitve ANA, RF, SPEP.  Sed rate mildly increased at 28. Follow-up with PCP if ongoing symptoms.     6.  Hypertension.  BP was 140/70.   Follow-up with PCP for monitoring.    7.  Health maintenance.  Mammogram and GI follow-up as recommended.     Follow Up Instructions: Pt referred to GI and pulmonary.  Follow-up 10/2018.      I discussed the assessment and treatment plan with the patient. The patient was provided an opportunity to ask questions and all were answered. The   patient agreed with the plan and demonstrated an understanding of the instructions.   The patient was advised to call back or seek an in-person evaluation if the symptoms worsen or if the condition fails to improve as anticipated.  I provided 21  minutes of non-face-to-face time during this encounter.   Vetta Higgs, MD  

## 2018-08-28 LAB — CATECHOLAMINES,UR.,FREE,24 HR
Dopamine, Rand Ur: 104 ug/L
Dopamine, Ur, 24Hr: 234 ug/24 hr (ref 0–510)
Epinephrine, Rand Ur: <1 ug/L
Epinephrine, U, 24Hr: <2 ug/24 hr (ref 0–20)
Norepinephrine, Rand Ur: 51 ug/L
Norepinephrine,U,24H: 115 ug/24 hr (ref 0–135)
Total Volume: 2250

## 2018-09-04 ENCOUNTER — Telehealth: Payer: Self-pay | Admitting: Hematology

## 2018-09-04 ENCOUNTER — Telehealth: Payer: Self-pay | Admitting: *Deleted

## 2018-09-04 NOTE — Telephone Encounter (Signed)
[facility] transfer. Confirmed with patient lab/fu with [facility] 10/29. Referrals sent to [facility] and [facility] via [facility].

## 2018-09-04 NOTE — Telephone Encounter (Signed)
Referral/records faxed to [facility] - Release [id number]

## 2018-09-04 NOTE — Telephone Encounter (Signed)
Referral/records faxed to [facility] - Release [id number]

## 2018-09-05 ENCOUNTER — Other Ambulatory Visit: Payer: Self-pay | Admitting: Cardiovascular Disease

## 2018-09-10 DIAGNOSIS — R899 Unspecified abnormal finding in specimens from other organs, systems and tissues: Secondary | ICD-10-CM | POA: Diagnosis not present

## 2018-09-15 NOTE — Progress Notes (Signed)
Subjective:   PATIENT ID: Gabrielle [name] GENDER: female DOB: 03/15/1945, MRN: [id number]   HPI  Chief Complaint  Patient presents with  . Consult    abnormal test results    Reason for Visit: New consult for pulmonary nodule  Ms. Gabrielle [name] is a 73 year old female former smoker who is referred to pulmonary clinic for incidental pulmonary nodule.  Per EMR records, last encounter with Dr. Walden Field in Hematology/Oncology on 08/22/18. In March , she developed hives/welts that started on her back then progressed to her arms and legs. She was initially seen by Allergy and treated with antihistamines and prednisone which resolved her symptoms. She was found with mild but persistently elevated tryptase. She is seen by hematology for mastocytosis. Further work-up by hematology showed elevated CHR A level which prompted CT CAP. Imaging incidentally found with left upper lobe pulmonary nodules measuring up to 9 mm. She was referred to Pulmonary.  Clinic note from GI at [facility] on 09/10/18 also reviewed: Hematology referred patient due to abnormal labs and GI symptoms of constipation and diarrhea. On assessment, GI expressed no evidence of GI carcinoid.  Patient reports she is asymptomatic from a respiratory standpoint. She does admit to shortness of breath with exertion which she attributes to arthritis and not participating in her regular yoga classes and [facility] [employer] Otherwise, denies cough, wheezing, pleuritic chest pain. Denies fevers, chills, unexplained weight loss. Denies palpitations, headaches, changes in vision, nausea or changes in bowel movements. Does report intermittent left-sided abdominal pain that is mild.  Social History: Quit in 1985. 20 pack years  Environmental exposures: None  I have personally reviewed patient's past medical/family/social history, allergies, current medications.  Past Medical History:  Diagnosis Date  . Anemia   . Arthritis 01-17-11   degenerative disc disease-all joints  . Bursitis   . Cholecystitis with cholelithiasis 03/22/2018  . Cholesterol serum elevated 01-17-11   tx. meds  . GERD (gastroesophageal reflux disease) 01-17-11   tx. omeprazole  . Heart murmur    slight murmur  . High cholesterol   . Hypertension 01-17-11   tx. meds  . Hypothyroidism   . Leg pain    ABIs 2/18: normal bilaterally  . Neuropathy    FEET  . Osteoarthritis   . Peripheral neuropathy   . Pulmonary nodule   . Seasonal allergies   . Sinus problem 01-17-11   tx. Claritin D  . ST elevation (STEMI) myocardial infarction involving left circumflex coronary artery ([place]) 08/21/2015     Family History  Problem Relation Age of Onset  . Aneurysm Mother   . Heart disease Father   . Heart disease Brother   . Hypertension Brother   . Diabetes Brother   . Hyperlipidemia Brother   . Diabetes Sister   . Hypertension Sister   . Hyperlipidemia Sister   . Neuropathy Neg Hx   . Urticaria Neg Hx   . Allergic rhinitis Neg Hx   . Asthma Neg Hx      Social History   Occupational History  . Occupation: Retired  Tobacco Use  . Smoking status: Former Smoker    Packs/day: 1.00    Years: 30.00    Pack years: 30.00    Types: Cigarettes    Start date: 9    Quit date: 08/13/1993    Years since quitting: 25.1  . Smokeless tobacco: Never Used  Substance and Sexual Activity  . Alcohol use: Yes    Alcohol/week: 2.0 standard  drinks    Types: 2 Glasses of wine per week    Comment: per day  . Drug use: No  . Sexual activity: Yes    Partners: Male    Comment: Married    Allergies  Allergen Reactions  . Penicillins Other (See Comments)    Allergy as an infant - no other information available Has patient had a PCN reaction causing immediate rash, facial/tongue/throat swelling, SOB or lightheadedness with hypotension: Unknown Has patient had a PCN reaction causing severe rash involving mucus membranes or skin necrosis: Unknown Has patient  had a PCN reaction that required hospitalization: No Has patient had a PCN reaction occurring within the last 10 years: No If all of the above answers are "NO", then may proceed with Cephalospor  . Pravastatin Other (See Comments)    Effects liver function     Outpatient Medications Prior to Visit  Medication Sig Dispense Refill  . aspirin EC 81 MG tablet Take 81 mg by mouth at bedtime.     . benazepril (LOTENSIN) 20 MG tablet Take 20 mg by mouth 2 (two) times daily.     . Calcium Carbonate-Vitamin D (CALCIUM 600 + D PO) Take 1 tablet by mouth 2 (two) times daily.      . Cholecalciferol (VITAMIN D) 2000 units CAPS Take 2,000 Units by mouth daily.    . diphenhydramine-acetaminophen (TYLENOL PM) 25-500 MG TABS tablet Take 3 tablets by mouth every morning.    . docusate sodium (COLACE) 100 MG capsule Take 100 mg by mouth 2 (two) times daily.     Marland Kitchen ezetimibe (ZETIA) 10 MG tablet TAKE 1 TABLET BY MOUTH EVERY DAY 90 tablet 3  . fish oil-omega-3 fatty acids 1000 MG capsule Take 1 g by mouth every evening.     . gabapentin (NEURONTIN) 600 MG tablet Take 600 mg by mouth 2 (two) times daily.    . Glucosamine-Chondroit-Vit C-Mn (GLUCOSAMINE CHONDR 1500 COMPLX PO) Take 2 tablets by mouth daily.    Marland Kitchen levothyroxine (SYNTHROID, LEVOTHROID) 75 MCG tablet Take 75 mcg by mouth daily before breakfast.     . Liniments (SALONPAS PAIN RELIEF PATCH EX) Place 1 patch onto the skin daily as needed (pain).    Marland Kitchen loratadine-pseudoephedrine (CLARITIN-D 24-HOUR) 10-240 MG per 24 hr tablet Take 1 tablet by mouth every evening.     . meclizine (ANTIVERT) 25 MG tablet Take 25 mg by mouth 3 (three) times daily as needed for dizziness.    . Melatonin 5 MG CAPS Take 5 mg by mouth at bedtime.     . metoprolol tartrate (LOPRESSOR) 25 MG tablet Take 0.5 tablets (12.5 mg total) by mouth 2 (two) times daily. Please make yearly appt with Dr. Burt Knack for October before anymore refills. 1st attempt 90 tablet 0  . NON FORMULARY Place 1  drop under the tongue every morning. CBD Oil    . omeprazole (PRILOSEC) 20 MG capsule Take 20 mg by mouth daily with breakfast.     . polyethylene glycol (MIRALAX / GLYCOLAX) packet Take 17 g by mouth daily as needed for mild constipation.     . potassium chloride (K-DUR,KLOR-CON) 10 MEQ tablet Take 10 mEq by mouth daily.     . rosuvastatin (CRESTOR) 40 MG tablet Take 40 mg by mouth at bedtime.   2  . triamterene-hydrochlorothiazide (MAXZIDE-25) 37.5-25 MG tablet Take 1 tablet by mouth daily.  1  . cetirizine (ZYRTEC) 10 MG tablet (20 mg) Two tablets in the evening daily (Patient taking  differently: One tablet in the evening daily) 60 tablet 5  . ferrous sulfate 325 (65 FE) MG tablet Take 325 mg by mouth daily with breakfast.    . fexofenadine (ALLEGRA) 180 MG tablet Take 2 tablets every morning. (Patient taking differently: Take 1 tablet every morning.) 60 tablet 5   No facility-administered medications prior to visit.     Review of Systems  Constitutional: Negative for chills, diaphoresis, fever, malaise/fatigue and weight loss.  HENT: Negative for congestion, ear pain and sore throat.   Respiratory: Positive for cough. Negative for hemoptysis, sputum production, shortness of breath and wheezing.   Cardiovascular: Negative for chest pain, palpitations and leg swelling.  Gastrointestinal: Positive for abdominal pain and heartburn. Negative for nausea.  Genitourinary: Negative for frequency.  Musculoskeletal: Negative for joint pain and myalgias.  Skin: Positive for rash. Negative for itching.  Neurological: Negative for dizziness, weakness and headaches.  Endo/Heme/Allergies: Does not bruise/bleed easily.  Psychiatric/Behavioral: Negative for depression. The patient is not nervous/anxious.      Objective:   Vitals:   09/16/18 1141 09/16/18 1142  BP:  132/60  Pulse:  95  Temp: 97.8 F (36.6 C)   TempSrc: Oral   SpO2:  97%  Weight: 183 lb 9.6 oz (83.3 kg)   Height: 5' (1.524 m)     SpO2: 97 % O2 Device: None (Room air)  Physical Exam: General: Well-appearing, no acute distress HENT: [place], AT Eyes: EOMI, no scleral icterus Respiratory: Clear to auscultation bilaterally.  No crackles, wheezing or rales Cardiovascular: RRR, -M/R/G, no JVD GI: BS+, soft, nontender Extremities:-Edema,-tenderness Neuro: AAO x4, CNII-XII grossly intact Skin: Intact, no rashes or bruising Psych: Normal mood, normal affect  Data Reviewed:  Imaging: CT CAP 08/15/18 - 6mm pulmonary nodule cluster in the LLL abutting the fissure. No mediastinal or hilar adenopathy noted.  PFT: None on file  Labs: Tryptase 18.1 (UL 13.2) Chromagranin A 563.6 (UL 101.8) 24-hour catecholamine-within normal limits 24-hour normetanephrine-902 (UL 612)  CBC    Component Value Date/Time   WBC 7.9 07/12/2018 1413   WBC 7.0 03/21/2018 1017   RBC 4.22 07/12/2018 1413   HGB 13.2 07/12/2018 1413   HCT 39.8 07/12/2018 1413   PLT 186 07/12/2018 1413   MCV 94.3 07/12/2018 1413   MCH 31.3 07/12/2018 1413   MCHC 33.2 07/12/2018 1413   RDW 12.7 07/12/2018 1413   LYMPHSABS 2.4 07/12/2018 1413   MONOABS 0.7 07/12/2018 1413   EOSABS 0.3 07/12/2018 1413   BASOSABS 0.1 07/12/2018 1413   BMET    Component Value Date/Time   NA 135 09/16/2018 1204   K 4.7 09/16/2018 1204   CL 100 09/16/2018 1204   CO2 26 09/16/2018 1204   GLUCOSE 114 (H) 09/16/2018 1204   BUN 23 09/16/2018 1204   CREATININE 0.97 09/16/2018 1204   CREATININE 1.18 (H) 07/12/2018 1413   CALCIUM 10.2 09/16/2018 1204   GFRNONAA 46 (L) 07/12/2018 1413   GFRAA 53 (L) 07/12/2018 1413   Imaging, labs and tests noted above have been reviewed independently by me.    Assessment & Plan:   Discussion: 73 year old female with history of CAD status post PCI who presents as a consult for incidental pulmonary nodule.  Currently being worked up for carcinoid tumor for elevated tryptase in chromogranin a levels. Carcinoid in the lung more commonly  presents as endobronchial lesions but can present as peripheral lesions.  CT imaging has been reviewed and there are no symptoms or secondary findings to suggest endobronchial lesion (  hemoptysis, wheezing, chronic cough, atelectasis, lung collapse.).  Transthoracic biopsy and/or bronchoscopy can be considered for further work-up however clinical picture is not consistent with carcinoid (patient denies diarrhea, denies flushing, denies palpitations, negative work-up except for elevated urine metanephrine).  I discussed with Dr. Burr Medico, the hematologist who is taking over her care.  After discussion, will remain conservative with patient's management and plan to repeat CT scan in 2 months.  Pending imaging, will consider trans thoracic needle biopsy for diagnostic testing versus PET imaging (DOTATE).  Pulmonary Nodule We will schedule CT Chest with contrast in 2 months We will schedule televideo visit after imaging is complete   Follow-up CT in 2 months  Greater than 50% of this patient 40-minute office visit was spent face-to-face in counseling with the patient/family. I discussed care with Dr. Burr Medico. We discussed medical diagnosis and treatment plan as noted.   Orders Placed This Encounter  Procedures  . CT Chest W Contrast    Standing Status:   Future    Standing Expiration Date:   11/16/2019    Scheduling Instructions:     Please scheduled around 11/17/18    Order Specific Question:   If indicated for the ordered procedure, I authorize the administration of contrast media per Radiology protocol    Answer:   Yes    Order Specific Question:   Preferred imaging location?    Answer:   [place]    Order Specific Question:   Radiology Contrast Protocol - do NOT remove file path    Answer:   \\[email or web address]\[email or web address]\CTProtocols.pdf  . Basic Metabolic Panel (BMET)    Standing Status:   Future    Number of Occurrences:   1    Standing Expiration Date:   09/16/2019  No orders of the  defined types were placed in this encounter.   Return in about 2 months (around 11/16/2018).  [place], MD [facility] 09/16/2018 8:05 PM  Office Number [phone]

## 2018-09-16 ENCOUNTER — Ambulatory Visit (INDEPENDENT_AMBULATORY_CARE_PROVIDER_SITE_OTHER): Payer: Medicare Other | Admitting: Pulmonary Disease

## 2018-09-16 ENCOUNTER — Other Ambulatory Visit: Payer: Self-pay

## 2018-09-16 ENCOUNTER — Encounter: Payer: Self-pay | Admitting: Pulmonary Disease

## 2018-09-16 VITALS — BP 132/60 | HR 95 | Temp 97.8°F | Ht 60.0 in | Wt 183.6 lb

## 2018-09-16 DIAGNOSIS — Z9189 Other specified personal risk factors, not elsewhere classified: Secondary | ICD-10-CM | POA: Insufficient documentation

## 2018-09-16 DIAGNOSIS — R911 Solitary pulmonary nodule: Secondary | ICD-10-CM | POA: Insufficient documentation

## 2018-09-16 LAB — BASIC METABOLIC PANEL
BUN: 23 mg/dL (ref 6–23)
CO2: 26 mEq/L (ref 19–32)
Calcium: 10.2 mg/dL (ref 8.4–10.5)
Chloride: 100 mEq/L (ref 96–112)
Creatinine, Ser: 0.97 mg/dL (ref 0.40–1.20)
GFR: 56.34 mL/min — ABNORMAL LOW (ref >60.00)
Glucose, Bld: 114 mg/dL — ABNORMAL HIGH (ref 70–99)
Potassium: 4.7 mEq/L (ref 3.5–5.1)
Sodium: 135 mEq/L (ref 135–145)

## 2018-09-16 NOTE — Patient Instructions (Signed)
Pulmonary Nodule We will schedule CT Chest with contrast in 2 months We will schedule televideo visit after imaging is complete

## 2018-09-19 ENCOUNTER — Ambulatory Visit (INDEPENDENT_AMBULATORY_CARE_PROVIDER_SITE_OTHER): Payer: Medicare Other | Admitting: Allergy & Immunology

## 2018-09-19 ENCOUNTER — Other Ambulatory Visit: Payer: Self-pay

## 2018-09-19 ENCOUNTER — Encounter: Payer: Self-pay | Admitting: Allergy & Immunology

## 2018-09-19 VITALS — BP 132/82 | HR 73 | Temp 97.6°F | Ht 65.0 in

## 2018-09-19 DIAGNOSIS — L508 Other urticaria: Secondary | ICD-10-CM

## 2018-09-19 DIAGNOSIS — J302 Other seasonal allergic rhinitis: Secondary | ICD-10-CM

## 2018-09-19 DIAGNOSIS — R748 Abnormal levels of other serum enzymes: Secondary | ICD-10-CM | POA: Diagnosis not present

## 2018-09-19 NOTE — Patient Instructions (Addendum)
1. Chronic urticaria - I am glad that the workup with Hematology/Oncology was normal. - Continue with Claritin-D daily as you are doing. - When the hives restart, start suppressive dosing of antihistamines:   - Morning: Allegra (fexofenadine) 180-360mg  (two tablets)   - Evening: Zyrtec (cetirizine) 10-20mg  (two tablets) - You can change this dosing at home, decreasing the dose as needed or increasing the dosing as needed.    2. Return in about 6 months (around 03/22/2019). This can be an in-person, a virtual Webex or a telephone follow up visit.   Please inform us of any Emergency Department visits, hospitalizations, or changes in symptoms. Call us before going to the ED for breathing or allergy symptoms since we might be able to fit you in for a sick visit. Feel free to contact us anytime with any questions, problems, or concerns.  It was a pleasure to see you again today!  Websites that have reliable patient information: 1. American Academy of Asthma, Allergy, and Immunology: www.aaaai.org 2. Food Allergy Research and Education (FARE): foodallergy.org 3. Mothers of Asthmatics: http://www.asthmacommunitynetwork.org 4. American College of Allergy, Asthma, and Immunology: www.acaai.org  "Like" [place] on Facebook and Instagram for our latest updates!      Make sure you are registered to vote! If you have moved or changed any of your contact information, you will need to get this updated before voting!  In some cases, you MAY be able to register to vote online: [email or web address]    Voter ID laws are NOT going into effect for the General Election in November 2020! DO NOT let this stop you from exercising your right to vote!   Absentee voting is the SAFEST way to vote during the coronavirus pandemic!   Download and print an absentee ballot request form at [email or web address] or you can scan the QR code below with your smart phone:      More  information on absentee ballots can be found here: [email or web address]

## 2018-09-19 NOTE — Progress Notes (Signed)
FOLLOW UP  Date of Service/Encounter:  09/19/18   Assessment:   Chronic Urticaria  Elevated serum tryptase - s/p extensive work-up with reassuring results  Lung nodule - currently receiving regular scans to monitor size   Plan/Recommendations:   1. Chronic urticaria - I am glad that the workup with Hematology/Oncology was normal. - Continue with Claritin-D daily as you are doing. - When the hives restart, start suppressive dosing of antihistamines:   - Morning: Allegra (fexofenadine) 180-360mg  (two tablets)   - Evening: Zyrtec (cetirizine) 10-20mg  (two tablets) - You can change this dosing at home, decreasing the dose as needed or increasing the dosing as needed.    2. Return in about 6 months (around 03/22/2019). This can be an in-person, a virtual Webex or a telephone follow up visit.    Subjective:   Gabrielle [name] is a 73 y.o. female presenting today for follow up of  Chief Complaint  Patient presents with   Urticaria    EDELIN [name] has a history of the following: Patient Active Problem List   Diagnosis Date Noted   Solitary pulmonary nodule 09/16/2018   Cholecystitis with cholelithiasis 03/22/2018   S/P lumbar fusion 03/07/2017   Hematoma of arm 08/24/2015   Coronary artery disease involving native coronary artery of native heart without angina pectoris    Dyslipidemia 08/22/2015   Heart murmur    History of ST elevation myocardial infarction (STEMI) 08/21/2015   Appendicitis, acute 08/14/2013   S/P laparoscopic appendectomy 08/13/2013   Neuropathy 11/11/2012   Hypertension 01/17/2011    History obtained from: chart review and patient.  Gabrielle [name] is a 73 y.o. female presenting for an evaluation of chronic urticaria.  During her previous work-up, it was noted that she had an elevated tryptase of over 20 on 2 different occasions.  Therefore, we sent her to hematology oncology for an evaluation.  They performed an extensive work-up which all  was normal thankfully.  She did have an elevated chromogranin A, but her 5 HIAA was normal.  She has talked to her gastroenterologist who did not want to do any further work-up since she had a normal scope approximately 1 year ago.  We did do stinging insect panel as part of her work-up as well which was low positive to the majority of the panel.  Patient started on suppressive doses of antihistamines. She has slowly weaned herself off of these medications (allegra and zyrtec). She reports no hives for a month and then had a few this week. She continues to take Claritin-D , which she was on before her first visit.  In additions on the her last visit her Tryptase was elevated. She has had a work-up by hematology and oncology which was normal. In addition she was referred to pulmonology for a lung nodule and it is being followed. Also seen by GI, patient reports no need for colonoscopy or endoscopy because she had these last year.  Regarding her elevated stinging insect panel, she is not overly concerned about this at all.  She has been stung once or twice in the past and has had no problems whatsoever.  We did discuss venom immunotherapy, but given the lack of a clinical reaction to these stinging insects, I do not think that venom immunotherapy is necessarily indicated.  She is very comfortable with this decision.  Otherwise, there have been no changes to her past medical history, surgical history, family history, or social history.    Review of Systems  All other systems reviewed and are negative.      Objective:   Blood pressure 132/82, pulse 73, temperature 97.6 F (36.4 C), temperature source Temporal, height 5\' 5"  (1.651 m), SpO2 96 %. Body mass index is 30.55 kg/m.   Physical Exam:  Physical Exam  Constitutional: She appears well-developed and well-nourished.  HENT:  Head: Normocephalic and atraumatic.  Right Ear: Tympanic membrane, external ear and ear canal normal.  Left Ear:  Tympanic membrane, external ear and ear canal normal.  Nose: No mucosal edema, rhinorrhea, nasal deformity or septal deviation. No epistaxis. Right sinus exhibits no maxillary sinus tenderness and no frontal sinus tenderness. Left sinus exhibits no maxillary sinus tenderness and no frontal sinus tenderness.  Mouth/Throat: Uvula is midline and oropharynx is clear and moist. Mucous membranes are not pale and not dry.  Eyes: Pupils are equal, round, and reactive to light. Conjunctivae and EOM are normal. Right eye exhibits no chemosis and no discharge. Left eye exhibits no chemosis and no discharge. Right conjunctiva is not injected. Left conjunctiva is not injected.  Cardiovascular: Normal rate, regular rhythm and normal heart sounds.  Respiratory: Effort normal and breath sounds normal. No accessory muscle usage. No tachypnea. No respiratory distress. She has no wheezes. She has no rhonchi. She has no rales. She exhibits no tenderness.  Lymphadenopathy:    She has no cervical adenopathy.  Neurological: She is alert.  Skin: No abrasion, no petechiae and no rash noted. Rash is not papular, not vesicular and not urticarial. No erythema. No pallor.  No urticarial lesions noted.  Psychiatric: She has a normal mood and affect.    Tamsen Snider, MD PGY1  [phone]     I performed a history and physical examination of the patient and discussed her management with the resident. I reviewed the residents note and agree with the documented findings and plan of care. The note in its entirety was edited by myself, including the physical exam, assessment, and plan.   Salvatore Marvel, MD [facility]

## 2018-09-24 DIAGNOSIS — Z23 Encounter for immunization: Secondary | ICD-10-CM | POA: Diagnosis not present

## 2018-10-18 ENCOUNTER — Other Ambulatory Visit: Payer: Self-pay | Admitting: Pulmonary Disease

## 2018-10-18 DIAGNOSIS — R0602 Shortness of breath: Secondary | ICD-10-CM

## 2018-10-18 DIAGNOSIS — R911 Solitary pulmonary nodule: Secondary | ICD-10-CM

## 2018-10-30 ENCOUNTER — Other Ambulatory Visit: Payer: Self-pay | Admitting: Allergy & Immunology

## 2018-11-01 ENCOUNTER — Other Ambulatory Visit (INDEPENDENT_AMBULATORY_CARE_PROVIDER_SITE_OTHER): Payer: Medicare Other

## 2018-11-01 DIAGNOSIS — R0602 Shortness of breath: Secondary | ICD-10-CM

## 2018-11-01 DIAGNOSIS — R911 Solitary pulmonary nodule: Secondary | ICD-10-CM

## 2018-11-01 LAB — BASIC METABOLIC PANEL
BUN: 24 mg/dL — ABNORMAL HIGH (ref 6–23)
CO2: 27 mEq/L (ref 19–32)
Calcium: 9.8 mg/dL (ref 8.4–10.5)
Chloride: 98 mEq/L (ref 96–112)
Creatinine, Ser: 0.96 mg/dL (ref 0.40–1.20)
GFR: 56.99 mL/min — ABNORMAL LOW (ref >60.00)
Glucose, Bld: 114 mg/dL — ABNORMAL HIGH (ref 70–99)
Potassium: 4.4 mEq/L (ref 3.5–5.1)
Sodium: 135 mEq/L (ref 135–145)

## 2018-11-12 NOTE — Progress Notes (Addendum)
Cardiology Office Note:    Date:  11/13/2018   ID:  Gabrielle [name], DOB 21-Jun-1945, MRN [id number]  PCP:  Hulan Fess, MD  Cardiologist:  Sherren Mocha, MD  Electrophysiologist:  None   Referring MD: Hulan Fess, MD   Chief Complaint: follow-up of CAD  History of Present Illness:    Gabrielle [name] is a 73 y.o. female with a history of CAD with history of inferolateral STEMI in 2017 s/p DES to OM1 and RCA, hypertension, hyperlipidemia, hypothyroidism, pulmonary nodule currently being worked up for carcinoid tumor, and GERD who is followed by Dr. Burt Knack and presents today for annual follow-up.  Patient first seen in 07/2015 when he presented to the hospital with inferolateral STEMI. She underwent primary PCI with DES to OM1 followed by staged PCI with DES to the RCA a couple of days later. Echocardiogram showed LVEF of 60-65% with normal wall motion and diastolic dysfunction. She has done well since that time with no recurrent ischemic events.  Patient last seen by Dr. Burt Knack in 10/2017 at which time she was doing well and denied any cardiac symptoms.   Patient currently being worked up for carcinoid tumor by Pulmonology after lung nodule was incidentally found of CT CAP during work-up for elevated tryptase and chromogranin A levels.  Recent Labs Reviewed: - 05/2018: Hgb 13.3, LDL 80, Hgb A1c 5.7. - 07/2018: AST 26, ALT 46, Alk Phos 103. - 10/2018: Na 135, K 4.4, BUN 24, Cr 0.96.   Patient presents today for annual follow-up. Patient doing well from a cardiac standpoint today. She is not as active as she was prior to the pandemic. However, she is still doing yoga 1-2 times per weeks and she has recently started walking around her house for 10-15 minutes about 3 times per week. She admits that she should be better about what she eats. She weighs 183 lbs today, slightly down from 185 lbs at visit last year. She denies any chest pain, shortness of breath, palpitations, lightheadedness,  dizziness, syncope, orthopnea, or PND. She has occasionally has very mild lower extremity if she eats foods high in sodium. Her only real complaint a this time is intermittent right upper abdominal pain that she describes as a dull pain and "swollen" feeling. She initially thought this was her gallbladder. However, she had a cholecystectomy in 03/2018 with no improvement. She notes she normally feels pain after meals and when lying down doing yoga. Occasionally with activity as well. Otherwise, doing well.   Past Medical History:  Diagnosis Date  . Anemia   . Arthritis 01-17-11   degenerative disc disease-all joints  . Bursitis   . Cholecystitis with cholelithiasis 03/22/2018  . Cholesterol serum elevated 01-17-11   tx. meds  . GERD (gastroesophageal reflux disease) 01-17-11   tx. omeprazole  . Heart murmur    slight murmur  . High cholesterol   . Hypertension 01-17-11   tx. meds  . Hypothyroidism   . Leg pain    ABIs 2/18: normal bilaterally  . Neuropathy    FEET  . Osteoarthritis   . Peripheral neuropathy   . Pulmonary nodule   . Seasonal allergies   . Sinus problem 01-17-11   tx. Claritin D  . ST elevation (STEMI) myocardial infarction involving left circumflex coronary artery ([place]) 08/21/2015    Past Surgical History:  Procedure Laterality Date  . ABDOMINAL HYSTERECTOMY  01-17-11   '93-Hysterectomy-heavy bleeding  . ANTERIOR LATERAL LUMBAR FUSION WITH PERCUTANEOUS SCREW 1 LEVEL N/A 03/07/2017  Procedure: XLIF L3-4;  Surgeon: Melina Schools, MD;  Location: [place];  Service: Orthopedics;  Laterality: N/A;  4 hrs  . APPENDECTOMY    . BACK SURGERY  01-17-11   04-20-10-T-Lift lumbar fusion  . [facility] PROCEDURE  01-17-11   Bladder sling  . CARDIAC CATHETERIZATION N/A 08/21/2015   Procedure: Left Heart Cath and Coronary Angiography;  Surgeon: Sherren Mocha, MD;  Location: [place] [facility];  Service: Cardiovascular;  Laterality: N/A;  . CARDIAC CATHETERIZATION N/A 08/21/2015    Procedure: Coronary Stent Intervention;  Surgeon: Sherren Mocha, MD;  Location: [place] [facility];  Service: Cardiovascular;  Laterality: N/A;  . CARDIAC CATHETERIZATION N/A 08/23/2015   Procedure: Coronary Stent Intervention;  Surgeon: Leonie Man, MD;  Location: [place] [facility];  Service: Cardiovascular;  Laterality: N/A;  promus 2.5x16 in RCA  . CARDIAC CATHETERIZATION N/A 08/23/2015   Procedure: Left Heart Cath and Coronary Angiography;  Surgeon: Leonie Man, MD;  Location: [place] [facility];  Service: Cardiovascular;  Laterality: N/A;  . CARPAL TUNNEL RELEASE  01-17-11   '02-left, also right  . CHOLECYSTECTOMY N/A 03/22/2018   Procedure: LAPAROSCOPIC CHOLECYSTECTOMY;  Surgeon: Fanny Skates, MD;  Location: [place];  Service: General;  Laterality: N/A;  . COLONOSCOPY    . HARDWARE REMOVAL Right 05/15/2013   Procedure: REMOVAL RIGHT L4-L5 PEDICLE SCREWS AND ROD ;  Surgeon: Melina Schools, MD;  Location: [place];  Service: Orthopedics;  Laterality: Right;  . JOINT REPLACEMENT  01-17-11    RTKA' 02  . LAPAROSCOPIC APPENDECTOMY N/A 08/13/2013   Procedure: APPENDECTOMY LAPAROSCOPIC;  Surgeon: Pedro Earls, MD;  Location: [facility];  Service: General;  Laterality: N/A;  . TONSILLECTOMY  01-17-11   child  . TOTAL KNEE ARTHROPLASTY  01/19/2011   Procedure: TOTAL KNEE ARTHROPLASTY;  Surgeon: Johnn Hai;  Location: [facility];  Service: Orthopedics;  Laterality: Left;    Current Medications: Current Meds  Medication Sig  . aspirin EC 81 MG tablet Take 81 mg by mouth at bedtime.   . benazepril (LOTENSIN) 20 MG tablet Take 1 tablet (20 mg total) by mouth 2 (two) times daily.  . Calcium Carbonate-Vitamin D (CALCIUM 600 + D PO) Take 1 tablet by mouth 2 (two) times daily.    . Cholecalciferol (VITAMIN D) 2000 units CAPS Take 2,000 Units by mouth daily.  . diphenhydramine-acetaminophen (TYLENOL PM) 25-500 MG TABS tablet Take 3 tablets by mouth every morning.  . docusate sodium (COLACE) 100  MG capsule Take 100 mg by mouth daily.   Marland Kitchen ezetimibe (ZETIA) 10 MG tablet Take 1 tablet (10 mg total) by mouth daily.  . fish oil-omega-3 fatty acids 1000 MG capsule Take 1 g by mouth every evening.   . gabapentin (NEURONTIN) 600 MG tablet Take 600 mg by mouth 2 (two) times daily.  . Glucosamine-Chondroit-Vit C-Mn (GLUCOSAMINE CHONDR 1500 COMPLX PO) Take 2 tablets by mouth daily.  Marland Kitchen levothyroxine (SYNTHROID, LEVOTHROID) 75 MCG tablet Take 75 mcg by mouth daily before breakfast.   . Liniments (SALONPAS PAIN RELIEF PATCH EX) Place 1 patch onto the skin daily as needed (pain).  Marland Kitchen loratadine-pseudoephedrine (CLARITIN-D 24-HOUR) 10-240 MG per 24 hr tablet Take 1 tablet by mouth every evening.   . meclizine (ANTIVERT) 25 MG tablet Take 25 mg by mouth 3 (three) times daily as needed for dizziness.  . Melatonin 5 MG CAPS Take 5 mg by mouth at bedtime.   . metoprolol tartrate (LOPRESSOR) 25 MG tablet Take 0.5 tablets (12.5  mg total) by mouth 2 (two) times daily.  . NON FORMULARY Place 1 drop under the tongue every morning. CBD Oil  . omeprazole (PRILOSEC) 20 MG capsule Take 20 mg by mouth daily with breakfast.   . polyethylene glycol (MIRALAX / GLYCOLAX) packet Take 17 g by mouth daily as needed for mild constipation.   . potassium chloride (KLOR-CON) 10 MEQ tablet Take 1 tablet (10 mEq total) by mouth daily.  . rosuvastatin (CRESTOR) 40 MG tablet Take 1 tablet (40 mg total) by mouth at bedtime.  . triamterene-hydrochlorothiazide (MAXZIDE-25) 37.5-25 MG tablet Take 1 tablet by mouth daily.  . [DISCONTINUED] benazepril (LOTENSIN) 20 MG tablet Take 20 mg by mouth 2 (two) times daily.   . [DISCONTINUED] ezetimibe (ZETIA) 10 MG tablet TAKE 1 TABLET BY MOUTH EVERY DAY  . [DISCONTINUED] metoprolol tartrate (LOPRESSOR) 25 MG tablet Take 0.5 tablets (12.5 mg total) by mouth 2 (two) times daily. Please make yearly appt with Dr. Burt Knack for October before anymore refills. 1st attempt  . [DISCONTINUED] potassium  chloride (K-DUR) 10 MEQ tablet Take 10 mEq by mouth daily.  . [DISCONTINUED] rosuvastatin (CRESTOR) 40 MG tablet Take 40 mg by mouth at bedtime.      Allergies:   Penicillins and Pravastatin   Social History   Socioeconomic History  . Marital status: Married    Spouse name: Not on file  . Number of children: 3  . Years of education: BSN+  . Highest education level: Not on file  Occupational History  . Occupation: Retired  Scientific laboratory technician  . Financial resource strain: Not on file  . Food insecurity    Worry: Not on file    Inability: Not on file  . Transportation needs    Medical: Not on file    Non-medical: Not on file  Tobacco Use  . Smoking status: Former Smoker    Packs/day: 1.00    Years: 30.00    Pack years: 30.00    Types: Cigarettes    Start date: 59    Quit date: 08/13/1993    Years since quitting: 25.2  . Smokeless tobacco: Never Used  Substance and Sexual Activity  . Alcohol use: Yes    Alcohol/week: 2.0 standard drinks    Types: 2 Glasses of wine per week    Comment: per day  . Drug use: No  . Sexual activity: Yes    Partners: Male    Comment: Married  Lifestyle  . Physical activity    Days per week: Not on file    Minutes per session: Not on file  . Stress: Not on file  Relationships  . Social Herbalist on phone: Not on file    Gets together: Not on file    Attends religious service: Not on file    Active member of club or organization: Not on file    Attends meetings of clubs or organizations: Not on file    Relationship status: Not on file  Other Topics Concern  . Not on file  Social History Narrative   Lives at home w/ her husband and granddaughter   Right-handed   Caffeine: 3 cups of tea daily     Family History: The patient's family history includes Aneurysm in her mother; Diabetes in her brother and sister; Heart disease in her brother and father; Hyperlipidemia in her brother and sister; Hypertension in her brother and  sister. There is no history of Neuropathy, Urticaria, Allergic rhinitis, or Asthma.  ROS:  Please see the history of present illness.    All other systems reviewed and are negative.  EKGs/Labs/Other Studies Reviewed:    The following studies were reviewed today:  Echocardiogram 08/21/2015:  Mid RCA lesion, 85% stenosed.  Ost LM lesion, 40% stenosed.  Prox LAD to Mid LAD lesion, 25% stenosed.  Ost Cx to Prox Cx lesion, 70% stenosed.  There is mild left ventricular systolic dysfunction.  1st Mrg lesion, 100% stenosed. Post intervention, there is a 0% residual stenosis.   1. Acute inferolateral STEMI secondary to total occlusion of the first OM branch. Is treated successfully with primary PCI. 2. Mild nonobstructive disease of the left main and LAD 3. Severe stenosis of the mid RCA: Recommend staged PCI prior to discharge 4. Mild segmental contraction abnormality the left ventricle with preserved overall LVEF  Recommendations: Aspirin and brilinta 12 months, aggressive medical therapy, staged PCI the right coronary artery on Monday. _______________  Echocardiogram 08/23/2015: Study Conclusions: - Left ventricle: The cavity size was normal. Systolic function was   normal. The estimated ejection fraction was in the range of 55%   to 60%. Wall motion was normal; there were no regional wall   motion abnormalities. Left ventricular diastolic function   parameters were normal. - Atrial septum: No defect or patent foramen ovale was identified.  _______________  Coronary Stent Intervention 08/23/2015:  Mid RCA lesion, 85 %stenosed.  A STENT PROMUS PREM MR 2.5X16 drug eluting stent was successfully placed. Post intervention, there is a 0% residual stenosis.  1st Mrg stent appears to be widely patent  Ost Cx to Prox Cx lesion, 70 %stenosed.   Successful focal PCI of mid RCA lesion with DES stent. Reviewed with Dr. Burt Knack, we both feel that the osteo-proximal circumflex lesion  is a very difficult lesion to consider PCI as there is really minimal landing zone for stent.  This also somewhat calcified and likely chronic. The patient had not been having anginal symptoms prior to coming in, therefore it is unlikely to be flow-limiting. Plan for now is medical management.  EKG:  EKG ordered today. EKG personally reviewed and demonstrates normal sinus rhythm, rate 97 bpm, with LVH with repolarization changes, left axis deviation, LAFB, and likely left atrial enlargement. No significant changes from prior tracing in 2019.  Recent Labs: 07/12/2018: ALT 46; Hemoglobin 13.2; Platelet Count 186 11/01/2018: BUN 24; Creatinine, Ser 0.96; Potassium 4.4; Sodium 135  Recent Lipid Panel    Component Value Date/Time   CHOL 180 10/12/2015 0835   TRIG 138 10/12/2015 0835   HDL 74 10/12/2015 0835   CHOLHDL 2.4 10/12/2015 0835   VLDL 28 10/12/2015 0835   LDLCALC 78 10/12/2015 0835    Physical Exam:    Vital Signs: BP 130/74   Pulse 100   Ht 5' (1.524 m)   Wt 183 lb 12.8 oz (83.4 kg)   SpO2 94%   BMI 35.90 kg/m     Wt Readings from Last 3 Encounters:  11/13/18 183 lb 12.8 oz (83.4 kg)  09/16/18 183 lb 9.6 oz (83.3 kg)  08/08/18 185 lb 12.8 oz (84.3 kg)     General: 73 y.o. obese Caucasian female resting in no acute distress. HEENT: Normocephalic and atraumatic. Sclera clear.  Neck: Supple. No carotid bruits. No JVD. Heart: RRR. Distinct S1 and S2. Soft systolic murmur best heard at upper sternal border. No gallops or rubs. Radial and distal pedal pulses 2+ and equal bilaterally. Lungs: No increased work of breathing. Clear to ausculation bilaterally.  No wheezes, rhonchi, or rales.  Abdomen: Soft, non-distended, and non-tender to palpation. Bowel sounds present. MSK: Normal strength and tone for age. Extremities: Trace bilateral ankle edema.    Skin: Warm and dry. Neuro: Alert and oriented x3. No focal deficits. Psych: Normal affect. Responds appropriately.  Assessment:     1. Coronary artery disease involving native coronary artery of native heart without angina pectoris   2. History of ST elevation myocardial infarction (STEMI)   3. Essential hypertension   4. Hyperlipidemia, unspecified hyperlipidemia type   5. Hypothyroidism, unspecified type   6. Right upper quadrant pain    Plan:    CAD without Angina - History of inferolateral STEMI in 2017 s/p DES to OM1 and RCA. - Stable. No angina.  - No acute changes on EKG today. - Continue aggressive secondary prevention with Aspirin 66m daily, Lopressor 12.540mtwice daily, Crestor 4059maily, Zetia 65m59mily.  Hypertension - BP well controlled today at 130/74. Heart rate borderline tachycardic in high 90's but patient forgot to take medications this morning including beta-blocker. - Continue Benazepril 20mg73mce daily, Triamterene-HCTZ 37.5-25mg daily, and Lopressor 12.5mg t48me daily. - Recent labs on 11/01/2018 showed normal K of 4.4 and Cr of 0.96.   Hyperlipidemia - Most recent lipid panel form 05/2018: Total Cholesterol 170, Triglycerides 132, HDL 64, LDL 80. LDL up from 67 from last year after addition of Zetia.  - Continue Crestor 40mg d71m and Zetia 65mg da57m  - Recommended lifestyle modification including heart healthy diet and exercise.  - Labs followed by PCP.   Hypothyroidism - On Synthroid.  - Managed by PCP.  Right Upper Quadrant Pain Patient reports intermittent right upper quadrant pain for the past several months. She had cholecystectomy in 03/2018 with no improvement. Describes the pain as a dull/swollen feeling mainly after means and with laying down during yoga. Occasionally with activity. Not similar to presentation prior to STEMI in 2017. Do not think this is an angina equivalent. Recommended following up with PCP if symptoms worsen.  Refills on cardiac medications sent today.  Disposition: Follow up in 1 year with Dr. Cooper. Burt Knackation Adjustments/Labs and Tests  Ordered: Current medicines are reviewed at length with the patient today.  Concerns regarding medicines are outlined above.  Orders Placed This Encounter  Procedures  . EKG 12-Lead   Meds ordered this encounter  Medications  . ezetimibe (ZETIA) 10 MG tablet    Sig: Take 1 tablet (10 mg total) by mouth daily.    Dispense:  90 tablet    Refill:  3  . metoprolol tartrate (LOPRESSOR) 25 MG tablet    Sig: Take 0.5 tablets (12.5 mg total) by mouth 2 (two) times daily.    Dispense:  90 tablet    Refill:  3  . potassium chloride (KLOR-CON) 10 MEQ tablet    Sig: Take 1 tablet (10 mEq total) by mouth daily.    Dispense:  90 tablet    Refill:  3  . rosuvastatin (CRESTOR) 40 MG tablet    Sig: Take 1 tablet (40 mg total) by mouth at bedtime.    Dispense:  90 tablet    Refill:  3  . benazepril (LOTENSIN) 20 MG tablet    Sig: Take 1 tablet (20 mg total) by mouth 2 (two) times daily.    Dispense:  180 tablet    Refill:  3    Patient Instructions  Medication Instructions:  Your physician recommends that you continue on your current  medications as directed. Please refer to the Current Medication list given to you today.  If you need a refill on your cardiac medications before your next appointment, please call your pharmacy.   Lab work: NONE ORDERED  If you have labs (blood work) drawn today and your tests are completely normal, you will receive your results only by: Marland Kitchen MyChart Message (if you have MyChart) OR . A paper copy in the mail If you have any lab test that is abnormal or we need to change your treatment, we will call you to review the results.  Testing/Procedures: NONE ORDERED  Follow-Up: At [facility], you and your health needs are our priority.  As part of our continuing mission to provide you with exceptional heart care, we have created designated Provider Care Teams.  These Care Teams include your primary Cardiologist (physician) and Advanced Practice Providers (APPs -   Physician Assistants and Nurse Practitioners) who all work together to provide you with the care you need, when you need it. You will need a follow up appointment in:  12 months.  Please call our office 2 months in advance to schedule this appointment.  You may see Sherren Mocha, MD or one of the following Advanced Practice Providers on your designated Care Team: Richardson Dopp, PA-C [place], [place] . Daune Perch, NP  Any Other Special Instructions Will Be Listed Below (If Applicable).  DASH Eating Plan DASH stands for "Dietary Approaches to Stop Hypertension." The DASH eating plan is a healthy eating plan that has been shown to reduce high blood pressure (hypertension). It may also reduce your risk for type 2 diabetes, heart disease, and stroke. The DASH eating plan may also help with weight loss. What are tips for following this plan?  General guidelines  Avoid eating more than 2,300 mg (milligrams) of salt (sodium) a day. If you have hypertension, you may need to reduce your sodium intake to 1,500 mg a day.  Limit alcohol intake to no more than 1 drink a day for nonpregnant women and 2 drinks a day for men. One drink equals 12 oz of beer, 5 oz of wine, or 1 oz of hard liquor.  Work with your health care provider to maintain a healthy body weight or to lose weight. Ask what an ideal weight is for you.  Get at least 30 minutes of exercise that causes your heart to beat faster (aerobic exercise) most days of the week. Activities may include walking, swimming, or biking.  Work with your health care provider or diet and nutrition specialist (dietitian) to adjust your eating plan to your individual calorie needs. Reading food labels   Check food labels for the amount of sodium per serving. Choose foods with less than 5 percent of the Daily Value of sodium. Generally, foods with less than 300 mg of sodium per serving fit into this eating plan.  To find whole grains, look for the word  "whole" as the first word in the ingredient list. Shopping  Buy products labeled as "low-sodium" or "no salt added."  Buy fresh foods. Avoid canned foods and premade or frozen meals. Cooking  Avoid adding salt when cooking. Use salt-free seasonings or herbs instead of table salt or sea salt. Check with your health care provider or pharmacist before using salt substitutes.  Do not fry foods. Cook foods using healthy methods such as baking, boiling, grilling, and broiling instead.  Cook with heart-healthy oils, such as olive, canola, soybean, or sunflower oil. Meal  planning  Eat a balanced diet that includes: ? 5 or more servings of fruits and vegetables each day. At each meal, try to fill half of your plate with fruits and vegetables. ? Up to 6-8 servings of whole grains each day. ? Less than 6 oz of lean meat, poultry, or fish each day. A 3-oz serving of meat is about the same size as a deck of cards. One egg equals 1 oz. ? 2 servings of low-fat dairy each day. ? A serving of nuts, seeds, or beans 5 times each week. ? Heart-healthy fats. Healthy fats called Omega-3 fatty acids are found in foods such as flaxseeds and coldwater fish, like sardines, salmon, and mackerel.  Limit how much you eat of the following: ? Canned or prepackaged foods. ? Food that is high in trans fat, such as fried foods. ? Food that is high in saturated fat, such as fatty meat. ? Sweets, desserts, sugary drinks, and other foods with added sugar. ? Full-fat dairy products.  Do not salt foods before eating.  Try to eat at least 2 vegetarian meals each week.  Eat more home-cooked food and less restaurant, buffet, and fast food.  When eating at a restaurant, ask that your food be prepared with less salt or no salt, if possible. What foods are recommended? The items listed may not be a complete list. Talk with your dietitian about what dietary choices are best for you. Grains Whole-grain or whole-wheat  bread. Whole-grain or whole-wheat pasta. Brown rice. [place]. Bulgur. Whole-grain and low-sodium cereals. Pita bread. Low-fat, low-sodium crackers. Whole-wheat flour tortillas. Vegetables Fresh or frozen vegetables (raw, steamed, roasted, or grilled). Low-sodium or reduced-sodium tomato and vegetable juice. Low-sodium or reduced-sodium tomato sauce and tomato paste. Low-sodium or reduced-sodium canned vegetables. Fruits All fresh, dried, or frozen fruit. Canned fruit in natural juice (without added sugar). Meat and other protein foods Skinless chicken or [place]. Ground chicken or [place]. Pork with fat trimmed off. Fish and seafood. Egg whites. Dried beans, peas, or lentils. Unsalted nuts, nut butters, and seeds. Unsalted canned beans. Lean cuts of beef with fat trimmed off. Low-sodium, lean deli meat. Dairy Low-fat (1%) or fat-free (skim) milk. Fat-free, low-fat, or reduced-fat cheeses. Nonfat, low-sodium ricotta or cottage cheese. Low-fat or nonfat yogurt. Low-fat, low-sodium cheese. Fats and oils Soft margarine without trans fats. Vegetable oil. Low-fat, reduced-fat, or light mayonnaise and salad dressings (reduced-sodium). Canola, safflower, olive, soybean, and sunflower oils. Avocado. Seasoning and other foods Herbs. Spices. Seasoning mixes without salt. Unsalted popcorn and pretzels. Fat-free sweets. What foods are not recommended? The items listed may not be a complete list. Talk with your dietitian about what dietary choices are best for you. Grains Baked goods made with fat, such as croissants, muffins, or some breads. Dry pasta or rice meal packs. Vegetables Creamed or fried vegetables. Vegetables in a cheese sauce. Regular canned vegetables (not low-sodium or reduced-sodium). Regular canned tomato sauce and paste (not low-sodium or reduced-sodium). Regular tomato and vegetable juice (not low-sodium or reduced-sodium). Angie Fava. Olives. Fruits Canned fruit in a light or heavy  syrup. Fried fruit. Fruit in cream or butter sauce. Meat and other protein foods Fatty cuts of meat. Ribs. Fried meat. Berniece Salines. Sausage. Bologna and other processed lunch meats. Salami. Fatback. Hotdogs. Bratwurst. Salted nuts and seeds. Canned beans with added salt. Canned or smoked fish. Whole eggs or egg yolks. Chicken or [place] with skin. Dairy Whole or 2% milk, cream, and half-and-half. Whole or full-fat cream cheese. Whole-fat or  sweetened yogurt. Full-fat cheese. Nondairy creamers. Whipped toppings. Processed cheese and cheese spreads. Fats and oils Butter. Stick margarine. Lard. Shortening. Ghee. Bacon fat. Tropical oils, such as coconut, palm kernel, or palm oil. Seasoning and other foods Salted popcorn and pretzels. Onion salt, garlic salt, seasoned salt, table salt, and sea salt. Worcestershire sauce. Tartar sauce. Barbecue sauce. Teriyaki sauce. Soy sauce, including reduced-sodium. Steak sauce. Canned and packaged gravies. Fish sauce. Oyster sauce. Cocktail sauce. Horseradish that you find on the shelf. Ketchup. Mustard. Meat flavorings and tenderizers. Bouillon cubes. Hot sauce and Tabasco sauce. Premade or packaged marinades. Premade or packaged taco seasonings. Relishes. Regular salad dressings. Where to find more information:  National Heart, Lung, and [place]: https://wilson-eaton.com/  American Heart Association: www.heart.org Summary  The DASH eating plan is a healthy eating plan that has been shown to reduce high blood pressure (hypertension). It may also reduce your risk for type 2 diabetes, heart disease, and stroke.  With the DASH eating plan, you should limit salt (sodium) intake to 2,300 mg a day. If you have hypertension, you may need to reduce your sodium intake to 1,500 mg a day.  When on the DASH eating plan, aim to eat more fresh fruits and vegetables, whole grains, lean proteins, low-fat dairy, and heart-healthy fats.  Work with your health care provider or diet and  nutrition specialist (dietitian) to adjust your eating plan to your individual calorie needs. This information is not intended to replace advice given to you by your health care provider. Make sure you discuss any questions you have with your health care provider. Document Released: 01/05/2011 Document Revised: 12/29/2016 Document Reviewed: 01/10/2016 Elsevier Patient Education  2020 [place], Darreld Mclean, [place]  11/13/2018 10:13 AM    [place]

## 2018-11-13 ENCOUNTER — Encounter: Payer: Self-pay | Admitting: Physician Assistant

## 2018-11-13 ENCOUNTER — Other Ambulatory Visit: Payer: Self-pay

## 2018-11-13 ENCOUNTER — Ambulatory Visit (INDEPENDENT_AMBULATORY_CARE_PROVIDER_SITE_OTHER): Payer: Medicare Other | Admitting: Student

## 2018-11-13 VITALS — BP 130/74 | HR 100 | Ht 60.0 in | Wt 183.8 lb

## 2018-11-13 DIAGNOSIS — R1011 Right upper quadrant pain: Secondary | ICD-10-CM

## 2018-11-13 DIAGNOSIS — E785 Hyperlipidemia, unspecified: Secondary | ICD-10-CM | POA: Diagnosis not present

## 2018-11-13 DIAGNOSIS — I2510 Atherosclerotic heart disease of native coronary artery without angina pectoris: Secondary | ICD-10-CM

## 2018-11-13 DIAGNOSIS — I10 Essential (primary) hypertension: Secondary | ICD-10-CM | POA: Diagnosis not present

## 2018-11-13 DIAGNOSIS — I252 Old myocardial infarction: Secondary | ICD-10-CM | POA: Diagnosis not present

## 2018-11-13 DIAGNOSIS — E039 Hypothyroidism, unspecified: Secondary | ICD-10-CM | POA: Diagnosis not present

## 2018-11-13 MED ORDER — POTASSIUM CHLORIDE ER 10 MEQ PO TBCR: 10.0000 meq | EXTENDED_RELEASE_TABLET | Freq: Every day | ORAL | 3 refills | Status: DC

## 2018-11-13 MED ORDER — METOPROLOL TARTRATE 25 MG PO TABS: 12.5000 mg | ORAL_TABLET | Freq: Two times a day (BID) | ORAL | 3 refills | Status: DC

## 2018-11-13 MED ORDER — BENAZEPRIL HCL 20 MG PO TABS: 20.0000 mg | ORAL_TABLET | Freq: Two times a day (BID) | ORAL | 3 refills | Status: DC

## 2018-11-13 MED ORDER — EZETIMIBE 10 MG PO TABS: 10.0000 mg | ORAL_TABLET | Freq: Every day | ORAL | 3 refills | Status: DC

## 2018-11-13 MED ORDER — ROSUVASTATIN CALCIUM 40 MG PO TABS: 40.0000 mg | ORAL_TABLET | Freq: Every day | ORAL | 3 refills | Status: DC

## 2018-11-13 NOTE — Patient Instructions (Signed)
Medication Instructions:  Your physician recommends that you continue on your current medications as directed. Please refer to the Current Medication list given to you today.  If you need a refill on your cardiac medications before your next appointment, please call your pharmacy.   Lab work: NONE ORDERED  If you have labs (blood work) drawn today and your tests are completely normal, you will receive your results only by: Marland Kitchen MyChart Message (if you have MyChart) OR . A paper copy in the mail If you have any lab test that is abnormal or we need to change your treatment, we will call you to review the results.  Testing/Procedures: NONE ORDERED  Follow-Up: At [facility], you and your health needs are our priority.  As part of our continuing mission to provide you with exceptional heart care, we have created designated Provider Care Teams.  These Care Teams include your primary Cardiologist (physician) and Advanced Practice Providers (APPs -  Physician Assistants and Nurse Practitioners) who all work together to provide you with the care you need, when you need it. You will need a follow up appointment in:  12 months.  Please call our office 2 months in advance to schedule this appointment.  You may see Sherren Mocha, MD or one of the following Advanced Practice Providers on your designated Care Team: Richardson Dopp, PA-C [place], [place] . Daune Perch, NP  Any Other Special Instructions Will Be Listed Below (If Applicable).  DASH Eating Plan DASH stands for "Dietary Approaches to Stop Hypertension." The DASH eating plan is a healthy eating plan that has been shown to reduce high blood pressure (hypertension). It may also reduce your risk for type 2 diabetes, heart disease, and stroke. The DASH eating plan may also help with weight loss. What are tips for following this plan?  General guidelines  Avoid eating more than 2,300 mg (milligrams) of salt (sodium) a day. If you have  hypertension, you may need to reduce your sodium intake to 1,500 mg a day.  Limit alcohol intake to no more than 1 drink a day for nonpregnant women and 2 drinks a day for men. One drink equals 12 oz of beer, 5 oz of wine, or 1 oz of hard liquor.  Work with your health care provider to maintain a healthy body weight or to lose weight. Ask what an ideal weight is for you.  Get at least 30 minutes of exercise that causes your heart to beat faster (aerobic exercise) most days of the week. Activities may include walking, swimming, or biking.  Work with your health care provider or diet and nutrition specialist (dietitian) to adjust your eating plan to your individual calorie needs. Reading food labels   Check food labels for the amount of sodium per serving. Choose foods with less than 5 percent of the Daily Value of sodium. Generally, foods with less than 300 mg of sodium per serving fit into this eating plan.  To find whole grains, look for the word "whole" as the first word in the ingredient list. Shopping  Buy products labeled as "low-sodium" or "no salt added."  Buy fresh foods. Avoid canned foods and premade or frozen meals. Cooking  Avoid adding salt when cooking. Use salt-free seasonings or herbs instead of table salt or sea salt. Check with your health care provider or pharmacist before using salt substitutes.  Do not fry foods. Cook foods using healthy methods such as baking, boiling, grilling, and broiling instead.  Cook with  heart-healthy oils, such as olive, canola, soybean, or sunflower oil. Meal planning  Eat a balanced diet that includes: ? 5 or more servings of fruits and vegetables each day. At each meal, try to fill half of your plate with fruits and vegetables. ? Up to 6-8 servings of whole grains each day. ? Less than 6 oz of lean meat, poultry, or fish each day. A 3-oz serving of meat is about the same size as a deck of cards. One egg equals 1 oz. ? 2 servings of  low-fat dairy each day. ? A serving of nuts, seeds, or beans 5 times each week. ? Heart-healthy fats. Healthy fats called Omega-3 fatty acids are found in foods such as flaxseeds and coldwater fish, like sardines, salmon, and mackerel.  Limit how much you eat of the following: ? Canned or prepackaged foods. ? Food that is high in trans fat, such as fried foods. ? Food that is high in saturated fat, such as fatty meat. ? Sweets, desserts, sugary drinks, and other foods with added sugar. ? Full-fat dairy products.  Do not salt foods before eating.  Try to eat at least 2 vegetarian meals each week.  Eat more home-cooked food and less restaurant, buffet, and fast food.  When eating at a restaurant, ask that your food be prepared with less salt or no salt, if possible. What foods are recommended? The items listed may not be a complete list. Talk with your dietitian about what dietary choices are best for you. Grains Whole-grain or whole-wheat bread. Whole-grain or whole-wheat pasta. Brown rice. [place]. Bulgur. Whole-grain and low-sodium cereals. Pita bread. Low-fat, low-sodium crackers. Whole-wheat flour tortillas. Vegetables Fresh or frozen vegetables (raw, steamed, roasted, or grilled). Low-sodium or reduced-sodium tomato and vegetable juice. Low-sodium or reduced-sodium tomato sauce and tomato paste. Low-sodium or reduced-sodium canned vegetables. Fruits All fresh, dried, or frozen fruit. Canned fruit in natural juice (without added sugar). Meat and other protein foods Skinless chicken or [place]. Ground chicken or [place]. Pork with fat trimmed off. Fish and seafood. Egg whites. Dried beans, peas, or lentils. Unsalted nuts, nut butters, and seeds. Unsalted canned beans. Lean cuts of beef with fat trimmed off. Low-sodium, lean deli meat. Dairy Low-fat (1%) or fat-free (skim) milk. Fat-free, low-fat, or reduced-fat cheeses. Nonfat, low-sodium ricotta or cottage cheese. Low-fat or  nonfat yogurt. Low-fat, low-sodium cheese. Fats and oils Soft margarine without trans fats. Vegetable oil. Low-fat, reduced-fat, or light mayonnaise and salad dressings (reduced-sodium). Canola, safflower, olive, soybean, and sunflower oils. Avocado. Seasoning and other foods Herbs. Spices. Seasoning mixes without salt. Unsalted popcorn and pretzels. Fat-free sweets. What foods are not recommended? The items listed may not be a complete list. Talk with your dietitian about what dietary choices are best for you. Grains Baked goods made with fat, such as croissants, muffins, or some breads. Dry pasta or rice meal packs. Vegetables Creamed or fried vegetables. Vegetables in a cheese sauce. Regular canned vegetables (not low-sodium or reduced-sodium). Regular canned tomato sauce and paste (not low-sodium or reduced-sodium). Regular tomato and vegetable juice (not low-sodium or reduced-sodium). Angie Fava. Olives. Fruits Canned fruit in a light or heavy syrup. Fried fruit. Fruit in cream or butter sauce. Meat and other protein foods Fatty cuts of meat. Ribs. Fried meat. Berniece Salines. Sausage. Bologna and other processed lunch meats. Salami. Fatback. Hotdogs. Bratwurst. Salted nuts and seeds. Canned beans with added salt. Canned or smoked fish. Whole eggs or egg yolks. Chicken or [place] with skin. Dairy Whole or 2%  milk, cream, and half-and-half. Whole or full-fat cream cheese. Whole-fat or sweetened yogurt. Full-fat cheese. Nondairy creamers. Whipped toppings. Processed cheese and cheese spreads. Fats and oils Butter. Stick margarine. Lard. Shortening. Ghee. Bacon fat. Tropical oils, such as coconut, palm kernel, or palm oil. Seasoning and other foods Salted popcorn and pretzels. Onion salt, garlic salt, seasoned salt, table salt, and sea salt. Worcestershire sauce. Tartar sauce. Barbecue sauce. Teriyaki sauce. Soy sauce, including reduced-sodium. Steak sauce. Canned and packaged gravies. Fish sauce. Oyster  sauce. Cocktail sauce. Horseradish that you find on the shelf. Ketchup. Mustard. Meat flavorings and tenderizers. Bouillon cubes. Hot sauce and Tabasco sauce. Premade or packaged marinades. Premade or packaged taco seasonings. Relishes. Regular salad dressings. Where to find more information:  National Heart, Lung, and [place]: https://wilson-eaton.com/  American Heart Association: www.heart.org Summary  The DASH eating plan is a healthy eating plan that has been shown to reduce high blood pressure (hypertension). It may also reduce your risk for type 2 diabetes, heart disease, and stroke.  With the DASH eating plan, you should limit salt (sodium) intake to 2,300 mg a day. If you have hypertension, you may need to reduce your sodium intake to 1,500 mg a day.  When on the DASH eating plan, aim to eat more fresh fruits and vegetables, whole grains, lean proteins, low-fat dairy, and heart-healthy fats.  Work with your health care provider or diet and nutrition specialist (dietitian) to adjust your eating plan to your individual calorie needs. This information is not intended to replace advice given to you by your health care provider. Make sure you discuss any questions you have with your health care provider. Document Released: 01/05/2011 Document Revised: 12/29/2016 Document Reviewed: 01/10/2016 Elsevier Patient Education  2020 [employer].

## 2018-11-15 ENCOUNTER — Other Ambulatory Visit: Payer: Self-pay

## 2018-11-15 ENCOUNTER — Ambulatory Visit (INDEPENDENT_AMBULATORY_CARE_PROVIDER_SITE_OTHER)
Admission: RE | Admit: 2018-11-15 | Discharge: 2018-11-15 | Disposition: A | Discharge status: Home / Self Care | Payer: Medicare Other | Source: Ambulatory Visit | Attending: Pulmonary Disease | Admitting: Pulmonary Disease

## 2018-11-15 DIAGNOSIS — R911 Solitary pulmonary nodule: Secondary | ICD-10-CM

## 2018-11-15 DIAGNOSIS — R918 Other nonspecific abnormal finding of lung field: Secondary | ICD-10-CM | POA: Diagnosis not present

## 2018-11-15 MED ORDER — IOHEXOL 300 MG/ML  SOLN
80.0000 mL | Freq: Once | INTRAMUSCULAR | Status: AC | PRN
  Administered 2018-11-15: 80 mL via INTRAVENOUS

## 2018-11-17 NOTE — Progress Notes (Signed)
Virtual Visit via Video Note  I connected with Gabrielle [name] on 11/18/18 at 10:00 AM EDT by a video enabled telemedicine application and verified that I am speaking with the correct person using two identifiers.  Location: Patient: Home Provider: [facility]   I discussed the limitations of evaluation and management by telemedicine and the availability of in person appointments. The patient expressed understanding and agreed to proceed.   I discussed the assessment and treatment plan with the patient. The patient was provided an opportunity to ask questions and all were answered. The patient agreed with the plan and demonstrated an understanding of the instructions.   The patient was advised to call back or seek an in-person evaluation if the symptoms worsen or if the condition fails to improve as anticipated.  I provided 22 minutes of non-face-to-face time during this encounter.   Gabrielle Hittle Rodman Pickle, MD   Subjective:   PATIENT ID: Gabrielle [name] DOB: February 02, 1945, MRN: [id number]   HPI  Chief Complaint  Patient presents with  . Follow-up    11/15/18 CT     Reason for Visit: Follow-up for pulmonary nodule  Ms. Gabrielle [name] is a 73 year old [name] former smoker who presents for CT Chest follow-up.  Of note, patient recently had extensive work-up following an abnormal tryptase. Her work-up so far is negative. She denies cough, wheezing or chest pain. Reports shortness of breath with moderate exertion including when walking up steps. However her dyspnea does not prevent her from doing regular activity including cooking, shopping and ambulating within the house. She denies any further episodes of urticaria which started the initial work-up and referral to Hem/Onc, GI and Pulmonary.  Denies fevers, chills, unexplained weight loss, episodes of flusing. Denies palpitations, headaches, changes in vision, nausea or changes in bowel movements.   Social  History: Quit in 1985. 20 pack years  Environmental exposures: None  I have personally reviewed patient's past medical/family/social history, allergies, current medications.  Past Medical History:  Diagnosis Date  . Anemia   . Arthritis 01-17-11   degenerative disc disease-all joints  . Bursitis   . Cholecystitis with cholelithiasis 03/22/2018  . Cholesterol serum elevated 01-17-11   tx. meds  . GERD (gastroesophageal reflux disease) 01-17-11   tx. omeprazole  . Heart murmur    slight murmur  . High cholesterol   . Hypertension 01-17-11   tx. meds  . Hypothyroidism   . Leg pain    ABIs 2/18: normal bilaterally  . Neuropathy    FEET  . Osteoarthritis   . Peripheral neuropathy   . Pulmonary nodule   . Seasonal allergies   . Sinus problem 01-17-11   tx. Claritin D  . ST elevation (STEMI) myocardial infarction involving left circumflex coronary artery (Gabrielle [name]) 08/21/2015     Family History  Problem Relation Age of Onset  . Aneurysm Mother   . Heart disease Father   . Heart disease Brother   . Hypertension Brother   . Diabetes Brother   . Hyperlipidemia Brother   . Diabetes Sister   . Hypertension Sister   . Hyperlipidemia Sister   . Neuropathy Neg Hx   . Urticaria Neg Hx   . Allergic rhinitis Neg Hx   . Asthma Neg Hx      Social History   Occupational History  . Occupation: Retired  Tobacco Use  . Smoking status: Former Smoker    Packs/day: 1.00    Years: 30.00  Pack years: 30.00    Types: Cigarettes    Start date: 65    Quit date: 08/13/1993    Years since quitting: 25.2  . Smokeless tobacco: Never Used  Substance and Sexual Activity  . Alcohol use: Yes    Alcohol/week: 2.0 standard drinks    Types: 2 Glasses of wine per week    Comment: per day  . Drug use: No  . Sexual activity: Yes    Partners: Male    Comment: Married    Allergies  Allergen Reactions  . Penicillins Other (See Comments)    Allergy as an infant - no other information  available Has patient had a PCN reaction causing immediate rash, facial/tongue/throat swelling, SOB or lightheadedness with hypotension: Unknown Has patient had a PCN reaction causing severe rash involving mucus membranes or skin necrosis: Unknown Has patient had a PCN reaction that required hospitalization: No Has patient had a PCN reaction occurring within the last 10 years: No If all of the above answers are "NO", then may proceed with Cephalospor  . Pravastatin Other (See Comments)    Effects liver function     Outpatient Medications Prior to Visit  Medication Sig Dispense Refill  . aspirin EC 81 MG tablet Take 81 mg by mouth at bedtime.     . benazepril (LOTENSIN) 20 MG tablet Take 1 tablet (20 mg total) by mouth 2 (two) times daily. 180 tablet 3  . Calcium Carbonate-Vitamin D (CALCIUM 600 + D PO) Take 1 tablet by mouth 2 (two) times daily.      . Cholecalciferol (VITAMIN D) 2000 units CAPS Take 2,000 Units by mouth daily.    . diphenhydramine-acetaminophen (TYLENOL PM) 25-500 MG TABS tablet Take 3 tablets by mouth every morning.    . docusate sodium (COLACE) 100 MG capsule Take 100 mg by mouth daily.     Marland Kitchen ezetimibe (ZETIA) 10 MG tablet Take 1 tablet (10 mg total) by mouth daily. 90 tablet 3  . fish oil-omega-3 fatty acids 1000 MG capsule Take 1 g by mouth every evening.     . gabapentin (NEURONTIN) 600 MG tablet Take 600 mg by mouth 2 (two) times daily.    . Glucosamine-Chondroit-Vit C-Mn (GLUCOSAMINE CHONDR 1500 COMPLX PO) Take 2 tablets by mouth daily.    Marland Kitchen levothyroxine (SYNTHROID, LEVOTHROID) 75 MCG tablet Take 75 mcg by mouth daily before breakfast.     . Liniments (SALONPAS PAIN RELIEF PATCH EX) Place 1 patch onto the skin daily as needed (pain).    Marland Kitchen loratadine-pseudoephedrine (CLARITIN-D 24-HOUR) 10-240 MG per 24 hr tablet Take 1 tablet by mouth every evening.     . meclizine (ANTIVERT) 25 MG tablet Take 25 mg by mouth 3 (three) times daily as needed for dizziness.    .  Melatonin 5 MG CAPS Take 5 mg by mouth at bedtime.     . metoprolol tartrate (LOPRESSOR) 25 MG tablet Take 0.5 tablets (12.5 mg total) by mouth 2 (two) times daily. 90 tablet 3  . NON FORMULARY Place 1 drop under the tongue every morning. CBD Oil    . omeprazole (PRILOSEC) 20 MG capsule Take 20 mg by mouth daily with breakfast.     . polyethylene glycol (MIRALAX / GLYCOLAX) packet Take 17 g by mouth daily as needed for mild constipation.     . potassium chloride (KLOR-CON) 10 MEQ tablet Take 1 tablet (10 mEq total) by mouth daily. 90 tablet 3  . rosuvastatin (CRESTOR) 40 MG tablet Take 1  tablet (40 mg total) by mouth at bedtime. 90 tablet 3  . triamterene-hydrochlorothiazide (MAXZIDE-25) 37.5-25 MG tablet Take 1 tablet by mouth daily.  1   No facility-administered medications prior to visit.     Review of Systems  Constitutional: Negative for chills, fever, malaise/fatigue and weight loss.  HENT: Negative for congestion.   Eyes: Negative for blurred vision.  Respiratory: Positive for shortness of breath. Negative for cough, hemoptysis, sputum production and wheezing.   Cardiovascular: Negative for chest pain, palpitations and PND.  Gastrointestinal: Negative for heartburn, nausea and vomiting.  Neurological: Negative for dizziness and headaches.  Endo/Heme/Allergies: Negative for environmental allergies.     Objective:   There were no vitals filed for this visit.    Physical Exam: General: Well-appearing, no acute distress HENT: [place], AT Eyes: EOMI, no scleral icterus Neuro: AAO x4, CNII-XII grossly intact Psych: Normal mood, normal affect  Data Reviewed:  Imaging: CT CAP 08/15/18 - 50mm pulmonary nodule cluster in the LLL abutting the fissure. No mediastinal or hilar adenopathy noted.  PFT: None on file  Labs: Tryptase 18.1 (UL 13.2) Chromagranin A 563.6 (UL 101.8) 24-hour catecholamine-within normal limits 24-hour normetanephrine-902 (UL 612)  CBC    Component Value  Date/Time   WBC 7.9 07/12/2018 1413   WBC 7.0 03/21/2018 1017   RBC 4.22 07/12/2018 1413   HGB 13.2 07/12/2018 1413   HCT 39.8 07/12/2018 1413   PLT 186 07/12/2018 1413   MCV 94.3 07/12/2018 1413   MCH 31.3 07/12/2018 1413   MCHC 33.2 07/12/2018 1413   RDW 12.7 07/12/2018 1413   LYMPHSABS 2.4 07/12/2018 1413   MONOABS 0.7 07/12/2018 1413   EOSABS 0.3 07/12/2018 1413   BASOSABS 0.1 07/12/2018 1413   BMET    Component Value Date/Time   NA 135 11/01/2018 1016   K 4.4 11/01/2018 1016   CL 98 11/01/2018 1016   CO2 27 11/01/2018 1016   GLUCOSE 114 (H) 11/01/2018 1016   BUN 24 (H) 11/01/2018 1016   CREATININE 0.96 11/01/2018 1016   CREATININE 1.18 (H) 07/12/2018 1413   CALCIUM 9.8 11/01/2018 1016   GFRNONAA 46 (L) 07/12/2018 1413   GFRAA 53 (L) 07/12/2018 1413   Imaging, labs and tests noted above have been reviewed independently by me.    Assessment & Plan:   Discussion: 73 year old [name] who presents for follow-up of incidental lung nodule She was previously worked up for possible carcinoid tumor for elevated tryptase in chromogranin a levels. Carcinoid in the lung more commonly presents as endobronchial lesions but can present as peripheral lesions.  CT imaging has been reviewed and there are no symptoms or secondary findings to suggest endobronchial lesion (hemoptysis, wheezing, chronic cough, atelectasis, lung collapse.).  We again discussed further work-up including bronchoscopy with biopsy if needed however her clinical presentation to me is not consistent with carcinoid (patient denies diarrhea, denies flushing, denies palpitations, negative work-up except for elevated urine metanephrine).   For her lung nodule, repeat CT Chest demonstrates stable nodules. We discussed surveillance imaging vs diagnostic testing. Her risk for malignancy is low-intermediate 6.23%. After discussion, patient wished to continue surveillance imaging.  Multiple lung nodules, ground glass  CT Chest  without contrast in 9 months (July 2021). Will need to monitor nodules for 2 years to ensure stability  Orders Placed This Encounter  Procedures  . CT Chest Wo Contrast    Please schedule for July 2021 Ground Glass    Standing Status:   Future  Standing Expiration Date:   01/18/2020    Order Specific Question:   Preferred imaging location?    Answer:   [facility]    Order Specific Question:   Radiology Contrast Protocol - do NOT remove file path    Answer:   [email or web address]  No orders of the defined types were placed in this encounter.   Return in about 8 months (around 07/31/2019).  [place], MD [facility] 11/17/2018 3:55 PM  Office Number [phone]

## 2018-11-18 ENCOUNTER — Encounter: Payer: Self-pay | Admitting: Pulmonary Disease

## 2018-11-18 ENCOUNTER — Telehealth (INDEPENDENT_AMBULATORY_CARE_PROVIDER_SITE_OTHER): Payer: Medicare Other | Admitting: Pulmonary Disease

## 2018-11-18 DIAGNOSIS — R918 Other nonspecific abnormal finding of lung field: Secondary | ICD-10-CM | POA: Diagnosis not present

## 2018-11-18 NOTE — Patient Instructions (Signed)
For your lung nodules seen on your CT scan, we discussed imaging vs diagnostic testing with bronchoscopy. Your risk for malignancy is low-intermediate 6.23%. Based on your imaging, I believe it would be reasonable to repeat CT scan in 6-9 months. After our discussion, we agreed to schedule CT in July 2021. If you develop any worsening respiratory symptoms (cough, shortness of breath, coughing of blood, unexplained weight loss, fevers, chills), please let us know and we can consider sooner imaging.  Multiple lung nodules, ground glass  CT Chest without contrast in 9 months (July 2021). Will need to monitor nodules for 2 years to ensure stability

## 2018-11-27 ENCOUNTER — Telehealth: Payer: Self-pay | Admitting: Hematology

## 2018-11-27 NOTE — Telephone Encounter (Signed)
Received a scheduling msg to r/s MS. Stinger's 10/29. Pt has been cld and rescheduled to see Dr. Burr Medico on 11/13 at 1pm w/labs at 12:45pm.

## 2018-11-28 ENCOUNTER — Inpatient Hospital Stay: Payer: Medicare Other | Admitting: Hematology

## 2018-11-28 ENCOUNTER — Inpatient Hospital Stay: Payer: Medicare Other

## 2018-12-10 DIAGNOSIS — Z03818 Encounter for observation for suspected exposure to other biological agents ruled out: Secondary | ICD-10-CM | POA: Diagnosis not present

## 2018-12-11 ENCOUNTER — Telehealth: Payer: Self-pay | Admitting: Hematology

## 2018-12-11 NOTE — Telephone Encounter (Signed)
Ms. Gabrielle [name] has been r/s to see Dr. Burr Medico to 12/4 at 9:40am w/labs at 9:15am. Pt wanted to reschedule due to exposure to COVID-19. She has been made aware of the new appt date and time.

## 2018-12-13 ENCOUNTER — Inpatient Hospital Stay: Payer: Medicare Other | Admitting: Hematology

## 2018-12-13 ENCOUNTER — Inpatient Hospital Stay: Payer: Medicare Other

## 2018-12-30 DIAGNOSIS — M25511 Pain in right shoulder: Secondary | ICD-10-CM | POA: Diagnosis not present

## 2018-12-30 DIAGNOSIS — M19011 Primary osteoarthritis, right shoulder: Secondary | ICD-10-CM | POA: Diagnosis not present

## 2019-01-02 DIAGNOSIS — M79644 Pain in right finger(s): Secondary | ICD-10-CM | POA: Diagnosis not present

## 2019-01-02 DIAGNOSIS — M1811 Unilateral primary osteoarthritis of first carpometacarpal joint, right hand: Secondary | ICD-10-CM | POA: Diagnosis not present

## 2019-01-02 NOTE — Progress Notes (Signed)
[place]   Telephone:([phone] [phone]   Clinic Follow up Note   Patient Care Team: Hulan Fess, MD as PCP - General (Family Medicine) Sherren Mocha, MD as PCP - Cardiology (Cardiology)  Date of Service:  01/03/2019  CHIEF COMPLAINT: F/u of Allergy reaction   CURRENT THERAPY:  Claritin once daily   INTERVAL HISTORY:  Gabrielle [name] is here for a follow up. She was previously under the care of Dr Walden Field and was last seen in 07/2018. She has now transferred her care to me. She notes in 05/2018 she had severe hives all over her body at some point. She was seen by allergist Dr. Ernst Bowler. Her 1 of her blood tests came back abnormal. Repeated test improved but was still abnormal. Dr. Walden Field did further work up but notes all those tests were negative. She was told it may be related to her GI track when she last saw Dr. Walden Field. She also notes her RUQ pain below her right rib.  She was seen by [facility] and does not feel it was related to her GI system. Further workup was not done.  She notes since 05/2018 outbreak, she notes occasional very mild skin hives that last 5 minutes. She is only on Claritin daily now.  She was previously seen by GI in 2019 and her colonoscopy/endoscopy was benign.   She notes hot flashes and she will turn red and sweaty. She notes after menopause her hot flashes never completely went away. It does not happen more frequently lately but are starting to present during the day more in the past 3 months. She notes her BM varies base don her diet. She can be constipated/diarrhea some times. She notes this has been going on for the past 6 months. She also feels her right abdomen protrudes more than her left.   She has PMHx of MI in 2017 and had 2 stents placed, Acid reflux, thyroid disease on synthroid, HTN. She has hysterectomy at 62s.    REVIEW OF SYSTEMS:   Constitutional: Denies fevers, chills or abnormal weight loss (+) hot flashes  Eyes:  Denies blurriness of vision Ears, nose, mouth, throat, and face: Denies mucositis or sore throat Respiratory: Denies cough, dyspnea or wheezes Cardiovascular: Denies palpitation, chest discomfort or lower extremity swelling Gastrointestinal:  Denies nausea, heartburn (+) inconsistent bowel movements  Skin: (+) Occasional very mild skin lesions  Lymphatics: Denies new lymphadenopathy or easy bruising Neurological:Denies numbness, tingling or new weaknesses Behavioral/Psych: Mood is stable, no new changes  All other systems were reviewed with the patient and are negative.  MEDICAL HISTORY:  Past Medical History:  Diagnosis Date  . Anemia   . Arthritis 01-17-11   degenerative disc disease-all joints  . Bursitis   . Cholecystitis with cholelithiasis 03/22/2018  . Cholesterol serum elevated 01-17-11   tx. meds  . GERD (gastroesophageal reflux disease) 01-17-11   tx. omeprazole  . Heart murmur    slight murmur  . High cholesterol   . Hypertension 01-17-11   tx. meds  . Hypothyroidism   . Leg pain    ABIs 2/18: normal bilaterally  . Neuropathy    FEET  . Osteoarthritis   . Peripheral neuropathy   . Pulmonary nodule   . Seasonal allergies   . Sinus problem 01-17-11   tx. Claritin D  . ST elevation (STEMI) myocardial infarction involving left circumflex coronary artery ([place]) 08/21/2015    SURGICAL HISTORY: Past Surgical History:  Procedure Laterality Date  .  ABDOMINAL HYSTERECTOMY  01-17-11   '93-Hysterectomy-heavy bleeding  . ANTERIOR LATERAL LUMBAR FUSION WITH PERCUTANEOUS SCREW 1 LEVEL N/A 03/07/2017   Procedure: XLIF L3-4;  Surgeon: Melina Schools, MD;  Location: [place];  Service: Orthopedics;  Laterality: N/A;  4 hrs  . APPENDECTOMY    . BACK SURGERY  01-17-11   04-20-10-T-Lift lumbar fusion  . [facility] PROCEDURE  01-17-11   Bladder sling  . CARDIAC CATHETERIZATION N/A 08/21/2015   Procedure: Left Heart Cath and Coronary Angiography;  Surgeon: Sherren Mocha, MD;  Location:  [place] [facility];  Service: Cardiovascular;  Laterality: N/A;  . CARDIAC CATHETERIZATION N/A 08/21/2015   Procedure: Coronary Stent Intervention;  Surgeon: Sherren Mocha, MD;  Location: [place] [facility];  Service: Cardiovascular;  Laterality: N/A;  . CARDIAC CATHETERIZATION N/A 08/23/2015   Procedure: Coronary Stent Intervention;  Surgeon: Leonie Man, MD;  Location: [place] [facility];  Service: Cardiovascular;  Laterality: N/A;  promus 2.5x16 in RCA  . CARDIAC CATHETERIZATION N/A 08/23/2015   Procedure: Left Heart Cath and Coronary Angiography;  Surgeon: Leonie Man, MD;  Location: [place] [facility];  Service: Cardiovascular;  Laterality: N/A;  . CARPAL TUNNEL RELEASE  01-17-11   '02-left, also right  . CHOLECYSTECTOMY N/A 03/22/2018   Procedure: LAPAROSCOPIC CHOLECYSTECTOMY;  Surgeon: Fanny Skates, MD;  Location: [place];  Service: General;  Laterality: N/A;  . COLONOSCOPY    . HARDWARE REMOVAL Right 05/15/2013   Procedure: REMOVAL RIGHT L4-L5 PEDICLE SCREWS AND ROD ;  Surgeon: Melina Schools, MD;  Location: [place];  Service: Orthopedics;  Laterality: Right;  . JOINT REPLACEMENT  01-17-11    RTKA' 02  . LAPAROSCOPIC APPENDECTOMY N/A 08/13/2013   Procedure: APPENDECTOMY LAPAROSCOPIC;  Surgeon: Pedro Earls, MD;  Location: [facility];  Service: General;  Laterality: N/A;  . TONSILLECTOMY  01-17-11   child  . TOTAL KNEE ARTHROPLASTY  01/19/2011   Procedure: TOTAL KNEE ARTHROPLASTY;  Surgeon: Johnn Hai;  Location: [facility];  Service: Orthopedics;  Laterality: Left;    I have reviewed the social history and family history with the patient and they are unchanged from previous note.  ALLERGIES:  is allergic to penicillins and pravastatin.  MEDICATIONS:  Current Outpatient Medications  Medication Sig Dispense Refill  . aspirin EC 81 MG tablet Take 81 mg by mouth at bedtime.     . benazepril (LOTENSIN) 20 MG tablet Take 1 tablet (20 mg total) by mouth 2 (two) times daily. 180  tablet 3  . Calcium Carbonate-Vitamin D (CALCIUM 600 + D PO) Take 1 tablet by mouth 2 (two) times daily.      . Cholecalciferol (VITAMIN D) 2000 units CAPS Take 2,000 Units by mouth daily.    . diphenhydramine-acetaminophen (TYLENOL PM) 25-500 MG TABS tablet Take 3 tablets by mouth every morning.    . docusate sodium (COLACE) 100 MG capsule Take 100 mg by mouth daily.     Marland Kitchen ezetimibe (ZETIA) 10 MG tablet Take 1 tablet (10 mg total) by mouth daily. 90 tablet 3  . fish oil-omega-3 fatty acids 1000 MG capsule Take 1 g by mouth every evening.     . gabapentin (NEURONTIN) 600 MG tablet Take 600 mg by mouth 2 (two) times daily.    . Glucosamine-Chondroit-Vit C-Mn (GLUCOSAMINE CHONDR 1500 COMPLX PO) Take 2 tablets by mouth daily.    Marland Kitchen levothyroxine (SYNTHROID, LEVOTHROID) 75 MCG tablet Take 75 mcg by mouth daily before breakfast.     . Liniments (SALONPAS  PAIN RELIEF PATCH EX) Place 1 patch onto the skin daily as needed (pain).    Marland Kitchen loratadine-pseudoephedrine (CLARITIN-D 24-HOUR) 10-240 MG per 24 hr tablet Take 1 tablet by mouth every evening.     . meclizine (ANTIVERT) 25 MG tablet Take 25 mg by mouth 3 (three) times daily as needed for dizziness.    . Melatonin 5 MG CAPS Take 5 mg by mouth at bedtime.     . metoprolol tartrate (LOPRESSOR) 25 MG tablet Take 0.5 tablets (12.5 mg total) by mouth 2 (two) times daily. 90 tablet 3  . NON FORMULARY Place 1 drop under the tongue every morning. CBD Oil    . omeprazole (PRILOSEC) 20 MG capsule Take 20 mg by mouth daily with breakfast.     . polyethylene glycol (MIRALAX / GLYCOLAX) packet Take 17 g by mouth daily as needed for mild constipation.     . potassium chloride (KLOR-CON) 10 MEQ tablet Take 1 tablet (10 mEq total) by mouth daily. 90 tablet 3  . rosuvastatin (CRESTOR) 40 MG tablet Take 1 tablet (40 mg total) by mouth at bedtime. 90 tablet 3  . triamterene-hydrochlorothiazide (MAXZIDE-25) 37.5-25 MG tablet Take 1 tablet by mouth daily.  1   No current  facility-administered medications for this visit.     PHYSICAL EXAMINATION: ECOG PERFORMANCE STATUS: 0 - Asymptomatic  Vitals:   01/03/19 0923 01/03/19 0925  BP: (!) 165/66 (!) 154/64  Pulse: 96   Resp: 20   Temp: 98.2 F (36.8 C)   SpO2: 100%    Filed Weights   01/03/19 0923  Weight: 181 lb 14.4 oz (82.5 kg)    GENERAL:alert, no distress and comfortable SKIN: skin color, texture, turgor are normal, no rashes or significant lesions EYES: normal, Conjunctiva are pink and non-injected, sclera clear  NECK: supple, thyroid normal size, non-tender, without nodularity LYMPH:  no palpable lymphadenopathy in the cervical, axillary  LUNGS: clear to auscultation and percussion with normal breathing effort HEART: regular rate & rhythm and no murmurs and no lower extremity edema ABDOMEN:abdomen soft, non-tender and normal bowel sounds Musculoskeletal:no cyanosis of digits and no clubbing  NEURO: alert & oriented x 3 with fluent speech, no focal motor/sensory deficits  LABORATORY DATA:  I have reviewed the data as listed CBC Latest Ref Rng & Units 01/03/2019 07/12/2018 03/21/2018  WBC 4.0 - 10.5 K/uL 10.8(H) 7.9 7.0  Hemoglobin 12.0 - 15.0 g/dL 13.5 13.2 12.7  Hematocrit 36.0 - 46.0 % 39.7 39.8 39.5  Platelets 150 - 400 K/uL 231 186 183     CMP Latest Ref Rng & Units 01/03/2019 11/01/2018 09/16/2018  Glucose 70 - 99 mg/dL 133(H) 114(H) 114(H)  BUN 8 - 23 mg/dL 30(H) 24(H) 23  Creatinine 0.44 - 1.00 mg/dL 1.22(H) 0.96 0.97  Sodium 135 - 145 mmol/L 134(L) 135 135  Potassium 3.5 - 5.1 mmol/L 4.3 4.4 4.7  Chloride 98 - 111 mmol/L 97(L) 98 100  CO2 22 - 32 mmol/L 26 27 26   Calcium 8.9 - 10.3 mg/dL 9.3 9.8 10.2  Total Protein 6.5 - 8.1 g/dL 7.2 - -  Total Bilirubin 0.3 - 1.2 mg/dL 0.4 - -  Alkaline Phos 38 - 126 U/L 103 - -  AST 15 - 41 U/L 30 - -  ALT 0 - 44 U/L 50(H) - -    CT Chest 08/15/18  IMPRESSION: 1. Several indeterminate left upper lobe pulmonary nodules measuring up to 9  mm. Non-contrast chest CT at 3-6 months is recommended. If the  nodules are stable at time of repeat CT, then future CT at 18-24 months (from today's scan) is considered optional for low-risk patients, but is recommended for high-risk patients. This recommendation follows the consensus statement: Guidelines for Management of Incidental Pulmonary Nodules Detected on CT Images: From the Fleischner Society 2017; Radiology 2017; 284:228-243. 2. No other masses or lymphadenopathy identified. 3. Stable moderate hepatic steatosis. 4. Aortic and coronary artery atherosclerosis.   CT Chest 11/15/18  IMPRESSION: 1. Three months stability of left upper lobe ground-glass nodularity in a peribronchovascular distribution. Favor infectious or inflammatory etiologies of indeterminate acuity. 2. Similar left lower lobe 8 mm pulmonary nodule, likely a subpleural lymph node. Per consensus criteria, this warrants follow-up at 3-9 months. This recommendation follows the consensus statement: Guidelines for Management of Incidental Pulmonary Nodules Detected on CT Images:From the Fleischner Society 2017; published online before print (10.1148/radiol.IJ:2314499). 3.  No acute process in the chest. 4. Coronary artery atherosclerosis. Aortic Atherosclerosis (ICD10-I70.0). 5. Hepatic steatosis. 6. Apparent gastric wall thickening, at least partially felt to be due to proximal inner distension. Correlate with symptoms of gastritis or other gastric pathology. If this is a clinical concern, recommend endoscopy.   RADIOGRAPHIC STUDIES: I have personally reviewed the radiological images as listed and agreed with the findings in the report. No results found.   ASSESSMENT & PLAN:  Gabrielle [name] is a 73 y.o. female with   1. 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 Hives resolved after Allegra -She was seen by allergist. She had  abnormal Tryptase in 20's. Other labs normal.  -She was seen by Dr. Walden Field who completed multiple workup test and scan all which were negative, except mildly elevated Tryptase. -she has no abnormal CBC, CT scan showed moderate steatosis, normal spleen size and appearance. -No definitive evidence of systemic mastocytosis, I do not think mildly elevated tryptase is a concern, usually tryptase is much higher in mastocytosis. -She is currently only on Claritin and only has occasional very mild skin rash arise, no other severe flare.  -I recommend she continue to f/u with allergist.    2. Flushing/Hot flashes, Diarrhea/constipation, cramping  -She has been having hot flashes occasionally and solely at night since her menopause at age 5.  -She notes in the past 3-6 months this has started to present in the day, but no increase in frequency. -The past 3-6 she has had diarrhea or constipation, along with abdominal cramps.   -Her Chr A was from 07/2018 was 564.  CT scan was negative for malignancy.  However giving her flushing and GI symptoms, and elevated chromogranin A, I would like to ruled out occult neuroendocrine tumor, especially from small bowel.  I recommend DOTATATE PET scan to further evaluate for this. She is agreeable.  -Per pt her 2019 colonoscopy and endoscopy was normal.  -I also discussed continuing to f/u with GI regularly to rule out IBS.    3.  Liver steatosis, RUQ pain   -Pt reports daily alcohol use.  Pt has US abdomen done 03/11/2018 that showed fatty changes or findings that may be consistent with HC disease.  Pt has negative hepatitis panel other than + Hb B S AB which is likely due to immunization.    4. Pulmonary nodule -CT CAP from 08/15/18 shows in determine left upper lobe pulmonary nodules measuring up to 9 mm -Pt has smoking history.  She was evaluated by pulmonary and will f/u with then.  5. Joint pain/arthritis, HTN -Continue Follow-up with PCP if ongoing  symptoms.      PLAN:  -DOTATATE PET scan in 2-3 weeks, will repeat, will repeat chromogranin A -Phone call after scan  -F/u in 1 year    No problem-specific Assessment & Plan notes found for this encounter.   No orders of the defined types were placed in this encounter.  All questions were answered. The patient knows to call the clinic with any problems, questions or concerns. No barriers to learning was detected. I spent 25 minutes counseling the patient face to face. The total time spent in the appointment was 30 minutes and more than 50% was on counseling and review of test results     Truitt Merle, MD 01/03/2019   I, Joslyn Devon, am acting as scribe for Truitt Merle, MD.   I have reviewed the above documentation for accuracy and completeness, and I agree with the above.

## 2019-01-03 ENCOUNTER — Inpatient Hospital Stay: Payer: Medicare Other

## 2019-01-03 ENCOUNTER — Other Ambulatory Visit: Payer: Self-pay

## 2019-01-03 ENCOUNTER — Inpatient Hospital Stay: Payer: Medicare Other | Attending: Hematology | Admitting: Hematology

## 2019-01-03 VITALS — BP 154/64 | HR 96 | Temp 98.2°F | Resp 20 | Ht 60.0 in | Wt 181.9 lb

## 2019-01-03 DIAGNOSIS — R918 Other nonspecific abnormal finding of lung field: Secondary | ICD-10-CM | POA: Insufficient documentation

## 2019-01-03 DIAGNOSIS — K760 Fatty (change of) liver, not elsewhere classified: Secondary | ICD-10-CM | POA: Insufficient documentation

## 2019-01-03 DIAGNOSIS — R911 Solitary pulmonary nodule: Secondary | ICD-10-CM | POA: Diagnosis not present

## 2019-01-03 DIAGNOSIS — R197 Diarrhea, unspecified: Secondary | ICD-10-CM | POA: Insufficient documentation

## 2019-01-03 DIAGNOSIS — I10 Essential (primary) hypertension: Secondary | ICD-10-CM | POA: Insufficient documentation

## 2019-01-03 DIAGNOSIS — R21 Rash and other nonspecific skin eruption: Secondary | ICD-10-CM | POA: Insufficient documentation

## 2019-01-03 DIAGNOSIS — R1011 Right upper quadrant pain: Secondary | ICD-10-CM | POA: Diagnosis not present

## 2019-01-03 DIAGNOSIS — R634 Abnormal weight loss: Secondary | ICD-10-CM

## 2019-01-03 DIAGNOSIS — M1990 Unspecified osteoarthritis, unspecified site: Secondary | ICD-10-CM | POA: Diagnosis not present

## 2019-01-03 DIAGNOSIS — Z90710 Acquired absence of both cervix and uterus: Secondary | ICD-10-CM | POA: Diagnosis not present

## 2019-01-03 DIAGNOSIS — I2510 Atherosclerotic heart disease of native coronary artery without angina pectoris: Secondary | ICD-10-CM | POA: Diagnosis not present

## 2019-01-03 DIAGNOSIS — R232 Flushing: Secondary | ICD-10-CM | POA: Insufficient documentation

## 2019-01-03 DIAGNOSIS — K5900 Constipation, unspecified: Secondary | ICD-10-CM | POA: Diagnosis not present

## 2019-01-03 DIAGNOSIS — I252 Old myocardial infarction: Secondary | ICD-10-CM | POA: Diagnosis not present

## 2019-01-03 DIAGNOSIS — Z79899 Other long term (current) drug therapy: Secondary | ICD-10-CM | POA: Insufficient documentation

## 2019-01-03 DIAGNOSIS — I700 Atherosclerosis of aorta: Secondary | ICD-10-CM | POA: Diagnosis not present

## 2019-01-03 DIAGNOSIS — D539 Nutritional anemia, unspecified: Secondary | ICD-10-CM

## 2019-01-03 DIAGNOSIS — R899 Unspecified abnormal finding in specimens from other organs, systems and tissues: Secondary | ICD-10-CM

## 2019-01-03 DIAGNOSIS — T7840XA Allergy, unspecified, initial encounter: Secondary | ICD-10-CM | POA: Diagnosis not present

## 2019-01-03 DIAGNOSIS — Z880 Allergy status to penicillin: Secondary | ICD-10-CM | POA: Diagnosis not present

## 2019-01-03 LAB — CMP (CANCER CENTER ONLY)
ALT: 50 U/L — ABNORMAL HIGH (ref 0–44)
AST: 30 U/L (ref 15–41)
Albumin: 4 g/dL (ref 3.5–5.0)
Alkaline Phosphatase: 103 U/L (ref 38–126)
Anion gap: 11 (ref 5–15)
BUN: 30 mg/dL — ABNORMAL HIGH (ref 8–23)
CO2: 26 mmol/L (ref 22–32)
Calcium: 9.3 mg/dL (ref 8.9–10.3)
Chloride: 97 mmol/L — ABNORMAL LOW (ref 98–111)
Creatinine: 1.22 mg/dL — ABNORMAL HIGH (ref 0.44–1.00)
GFR, Est AFR Am: 51 mL/min — ABNORMAL LOW (ref >60)
GFR, Estimated: 44 mL/min — ABNORMAL LOW (ref >60)
Glucose, Bld: 133 mg/dL — ABNORMAL HIGH (ref 70–99)
Potassium: 4.3 mmol/L (ref 3.5–5.1)
Sodium: 134 mmol/L — ABNORMAL LOW (ref 135–145)
Total Bilirubin: 0.4 mg/dL (ref 0.3–1.2)
Total Protein: 7.2 g/dL (ref 6.5–8.1)

## 2019-01-03 LAB — CBC WITH DIFFERENTIAL (CANCER CENTER ONLY)
Abs Immature Granulocytes: 0.05 10*3/uL (ref 0.00–0.07)
Basophils Absolute: 0 10*3/uL (ref 0.0–0.1)
Basophils Relative: 0 %
Eosinophils Absolute: 0 10*3/uL (ref 0.0–0.5)
Eosinophils Relative: 0 %
HCT: 39.7 % (ref 36.0–46.0)
Hemoglobin: 13.5 g/dL (ref 12.0–15.0)
Immature Granulocytes: 1 %
Lymphocytes Relative: 10 %
Lymphs Abs: 1.1 10*3/uL (ref 0.7–4.0)
MCH: 31 pg (ref 26.0–34.0)
MCHC: 34 g/dL (ref 30.0–36.0)
MCV: 91.1 fL (ref 80.0–100.0)
Monocytes Absolute: 0.8 10*3/uL (ref 0.1–1.0)
Monocytes Relative: 7 %
Neutro Abs: 8.9 10*3/uL — ABNORMAL HIGH (ref 1.7–7.7)
Neutrophils Relative %: 82 %
Platelet Count: 231 10*3/uL (ref 150–400)
RBC: 4.36 MIL/uL (ref 3.87–5.11)
RDW: 12.4 % (ref 11.5–15.5)
WBC Count: 10.8 10*3/uL — ABNORMAL HIGH (ref 4.0–10.5)
nRBC: 0 % (ref 0.0–0.2)

## 2019-01-03 LAB — LACTATE DEHYDROGENASE: LDH: 184 U/L (ref 98–192)

## 2019-01-03 LAB — FERRITIN: Ferritin: 182 ng/mL (ref 11–307)

## 2019-01-04 ENCOUNTER — Encounter: Payer: Self-pay | Admitting: Hematology

## 2019-01-06 ENCOUNTER — Telehealth: Payer: Self-pay | Admitting: Hematology

## 2019-01-06 NOTE — Telephone Encounter (Signed)
Scheduled appt per 12/4 los.  Spoke with pt and she is aware of her appt date and time.

## 2019-01-12 ENCOUNTER — Encounter: Payer: Self-pay | Admitting: Hematology

## 2019-01-13 DIAGNOSIS — Z981 Arthrodesis status: Secondary | ICD-10-CM | POA: Diagnosis not present

## 2019-01-13 DIAGNOSIS — M533 Sacrococcygeal disorders, not elsewhere classified: Secondary | ICD-10-CM | POA: Diagnosis not present

## 2019-01-15 ENCOUNTER — Telehealth: Payer: Self-pay

## 2019-01-15 NOTE — Telephone Encounter (Signed)
I spoke with Ms Gabrielle [name] this pm.  I rescheduled her PET scan [facility] for 02/03/2019, however Ms Gabrielle [name] insuranc changes 01/31/2019 and she will be out of town.  [facility] unable to do scan sooner because of drug availability. I attempted to contact [place] nm to see if she could have this done by the end of the  Year.  I left message with [place] nm to call us back.

## 2019-01-17 ENCOUNTER — Inpatient Hospital Stay: Payer: Medicare Other

## 2019-01-17 ENCOUNTER — Encounter (HOSPITAL_COMMUNITY): Admission: RE | Admit: 2019-01-17 | Payer: Medicare Other | Source: Ambulatory Visit

## 2019-01-29 ENCOUNTER — Inpatient Hospital Stay: Payer: Medicare Other

## 2019-01-29 ENCOUNTER — Other Ambulatory Visit: Payer: Self-pay

## 2019-01-29 DIAGNOSIS — T7840XA Allergy, unspecified, initial encounter: Secondary | ICD-10-CM | POA: Diagnosis not present

## 2019-01-29 DIAGNOSIS — R232 Flushing: Secondary | ICD-10-CM

## 2019-01-29 LAB — CMP (CANCER CENTER ONLY)
ALT: 47 U/L — ABNORMAL HIGH (ref 0–44)
AST: 27 U/L (ref 15–41)
Albumin: 3.9 g/dL (ref 3.5–5.0)
Alkaline Phosphatase: 93 U/L (ref 38–126)
Anion gap: 10 (ref 5–15)
BUN: 24 mg/dL — ABNORMAL HIGH (ref 8–23)
CO2: 27 mmol/L (ref 22–32)
Calcium: 9.5 mg/dL (ref 8.9–10.3)
Chloride: 96 mmol/L — ABNORMAL LOW (ref 98–111)
Creatinine: 1.13 mg/dL — ABNORMAL HIGH (ref 0.44–1.00)
GFR, Est AFR Am: 56 mL/min — ABNORMAL LOW (ref >60)
GFR, Estimated: 48 mL/min — ABNORMAL LOW (ref >60)
Glucose, Bld: 106 mg/dL — ABNORMAL HIGH (ref 70–99)
Potassium: 4.5 mmol/L (ref 3.5–5.1)
Sodium: 133 mmol/L — ABNORMAL LOW (ref 135–145)
Total Bilirubin: 0.4 mg/dL (ref 0.3–1.2)
Total Protein: 7 g/dL (ref 6.5–8.1)

## 2019-01-29 LAB — CBC WITH DIFFERENTIAL (CANCER CENTER ONLY)
Abs Immature Granulocytes: 0.02 10*3/uL (ref 0.00–0.07)
Basophils Absolute: 0 10*3/uL (ref 0.0–0.1)
Basophils Relative: 1 %
Eosinophils Absolute: 0.3 10*3/uL (ref 0.0–0.5)
Eosinophils Relative: 5 %
HCT: 38.5 % (ref 36.0–46.0)
Hemoglobin: 12.7 g/dL (ref 12.0–15.0)
Immature Granulocytes: 0 %
Lymphocytes Relative: 26 %
Lymphs Abs: 1.8 10*3/uL (ref 0.7–4.0)
MCH: 30.9 pg (ref 26.0–34.0)
MCHC: 33 g/dL (ref 30.0–36.0)
MCV: 93.7 fL (ref 80.0–100.0)
Monocytes Absolute: 0.6 10*3/uL (ref 0.1–1.0)
Monocytes Relative: 9 %
Neutro Abs: 4 10*3/uL (ref 1.7–7.7)
Neutrophils Relative %: 59 %
Platelet Count: 192 10*3/uL (ref 150–400)
RBC: 4.11 MIL/uL (ref 3.87–5.11)
RDW: 13.3 % (ref 11.5–15.5)
WBC Count: 6.7 10*3/uL (ref 4.0–10.5)
nRBC: 0 % (ref 0.0–0.2)

## 2019-01-30 ENCOUNTER — Encounter
Admission: RE | Admit: 2019-01-30 | Discharge: 2019-01-30 | Disposition: A | Discharge status: Home / Self Care | Payer: Medicare Other | Source: Ambulatory Visit | Attending: Hematology | Admitting: Hematology

## 2019-01-30 ENCOUNTER — Encounter

## 2019-01-30 DIAGNOSIS — R634 Abnormal weight loss: Secondary | ICD-10-CM | POA: Insufficient documentation

## 2019-01-30 LAB — CHROMOGRANIN A: Chromogranin A (ng/mL): 814.6 ng/mL — ABNORMAL HIGH (ref 0.0–101.8)

## 2019-01-30 MED ORDER — GALLIUM GA 68 DOTATATE IV KIT
4.4500 | PACK | Freq: Once | INTRAVENOUS | Status: AC | PRN
  Administered 2019-01-30: 4.86 via INTRAVENOUS

## 2019-01-31 ENCOUNTER — Encounter: Payer: Self-pay | Admitting: Hematology

## 2019-02-03 ENCOUNTER — Other Ambulatory Visit (HOSPITAL_COMMUNITY): Payer: Medicare Other

## 2019-02-03 ENCOUNTER — Other Ambulatory Visit: Payer: Medicare Other

## 2019-02-04 ENCOUNTER — Telehealth: Payer: Self-pay

## 2019-02-04 ENCOUNTER — Telehealth: Payer: Self-pay | Admitting: Nurse Practitioner

## 2019-02-04 NOTE — Telephone Encounter (Signed)
I spoke with Gabrielle [name] this am.  I let her know her PET scan was negative per Dr. Burr Medico.  She verbalized understanding.  She asked why the chromoranin A was elevated. I told her I would speak with Dr. Burr Medico regarding this lab and call her with explanation.  She verbalized understanding.

## 2019-02-04 NOTE — Telephone Encounter (Signed)
I called Ms. Drennen to further discuss her elevated Chromogranin A. I reviewed this is a non-specific test and various non-malignant causes for elevated levels including PPI and antihistamines, liver changes, irritable bowel symptoms, HTN, and renal insufficiency which apply to her specifically. Cr 1.1 - 1.2 in December when lab was drawn. She has been on PPI for years and was taking it when both CgA levels were obtained. I discussed she can try coming off PPI and claritin for 3 weeks then repeat lab, but given her PET dotatate is negative I don't think this is necessary. She has worsening GI symptoms off PPI and would be difficult to hold for that long. She will follow Dr. Ernestina Penna previous recommendations for f/u with GI. She will call if she has new or worsening symptoms or concerns in the interim. Routine f/u in 1 year as recommended by MD. She verbalizes understanding and appreciates the call.   Cira Rue, NP  02/04/2019

## 2019-02-23 ENCOUNTER — Ambulatory Visit: Payer: Medicare Other | Attending: Internal Medicine

## 2019-02-23 DIAGNOSIS — Z23 Encounter for immunization: Secondary | ICD-10-CM

## 2019-02-24 NOTE — Progress Notes (Signed)
   Covid-19 Vaccination Clinic  Name:  JAIMY OGUIN    MRN: [id number] DOB: 1945-12-10  02/23/2019  Ms. Napierkowski was observed post Covid-19 immunization for 15 minutes without incidence. She was provided with Vaccine Information Sheet and instruction to access the V-Safe system.   Ms. Tokarz was instructed to call 911 with any severe reactions post vaccine: Marland Kitchen Difficulty breathing  . Swelling of your face and throat  . A fast heartbeat  . A bad rash all over your body  . Dizziness and weakness    Immunizations Administered    Name Date Dose VIS Date Route   Moderna COVID-19 Vaccine 02/23/2019  5:10 PM 0.5 mL 12/31/2018 Intramuscular   Manufacturer: Levan Hurst   LotAR:[id number]   [place]:[id number]      Documented on behalf of:  C. Hubbard

## 2019-03-03 MED ORDER — EZETIMIBE 10 MG PO TABS: 10.0000 mg | ORAL_TABLET | Freq: Every day | ORAL | 2 refills | Status: DC

## 2019-03-20 ENCOUNTER — Ambulatory Visit: Payer: Medicare Other | Admitting: Allergy & Immunology

## 2019-03-23 ENCOUNTER — Ambulatory Visit: Payer: Medicare Other | Attending: Internal Medicine

## 2019-03-23 DIAGNOSIS — Z23 Encounter for immunization: Secondary | ICD-10-CM | POA: Insufficient documentation

## 2019-03-23 NOTE — Progress Notes (Signed)
   Covid-19 Vaccination Clinic  Name:  Gabrielle [name]    MRN: [id number] DOB: 29-Aug-1945  03/23/2019  Ms. School was observed post Covid-19 immunization for 15 minutes without incidence. She was provided with Vaccine Information Sheet and instruction to access the V-Safe system.   [name] was instructed to call 911 with any severe reactions post vaccine: Marland Kitchen Difficulty breathing  . Swelling of your face and throat  . A fast heartbeat  . A bad rash all over your body  . Dizziness and weakness    Immunizations Administered    Name Date Dose VIS Date Route   Moderna COVID-19 Vaccine 03/23/2019  3:53 PM 0.5 mL 12/31/2018 Intramuscular   Manufacturer: Moderna   Lot: [id number]   [place]:[id number]

## 2019-03-25 ENCOUNTER — Ambulatory Visit: Payer: Medicare Other

## 2019-04-09 ENCOUNTER — Ambulatory Visit: Payer: Medicare Other | Admitting: Podiatry

## 2019-04-09 ENCOUNTER — Other Ambulatory Visit: Payer: Self-pay

## 2019-04-09 DIAGNOSIS — M79674 Pain in right toe(s): Secondary | ICD-10-CM

## 2019-04-09 DIAGNOSIS — M2041 Other hammer toe(s) (acquired), right foot: Secondary | ICD-10-CM | POA: Diagnosis not present

## 2019-04-09 DIAGNOSIS — M7751 Other enthesopathy of right foot: Secondary | ICD-10-CM

## 2019-04-10 ENCOUNTER — Encounter: Payer: Self-pay | Admitting: Podiatry

## 2019-04-10 NOTE — Progress Notes (Signed)
Subjective:  Patient ID: Gabrielle [name], female    DOB: [birth year],  MRN: [id number]  Chief Complaint  Patient presents with  . Callouses    Pt states bilateral feet callouses, right 5th is most painful, 3 months duration.    74 y.o. female presents with the above complaint.  Patient presents with right painful lateral aspect callus formation after contracture of the fifth digit.  Patient states bilateral fifth digit corn hyperkeratotic lesions hurt a lot.  But the right side is the most painful 1.  Patient states that this has been going on for a long period of time.  It has progressively gotten worse.  She has tried offloading it taping it protecting the toe with TobraDex with nothing is helped.  She has tried wider Surveyor, mining modification which has not helped.  She would like to know if there is anything else I can be done to treat this.   Review of Systems: Negative except as noted in the HPI. Denies N/V/F/Ch.  Past Medical History:  Diagnosis Date  . Anemia   . Arthritis 01-17-11   degenerative disc disease-all joints  . Bursitis   . Cholecystitis with cholelithiasis 03/22/2018  . Cholesterol serum elevated 01-17-11   tx. meds  . GERD (gastroesophageal reflux disease) 01-17-11   tx. omeprazole  . Heart murmur    slight murmur  . High cholesterol   . Hypertension 01-17-11   tx. meds  . Hypothyroidism   . Leg pain    ABIs 2/18: normal bilaterally  . Neuropathy    FEET  . Osteoarthritis   . Peripheral neuropathy   . Pulmonary nodule   . Seasonal allergies   . Sinus problem 01-17-11   tx. Claritin D  . ST elevation (STEMI) myocardial infarction involving left circumflex coronary artery ([place]) 08/21/2015    Current Outpatient Medications:  .  aspirin EC 81 MG tablet, Take 81 mg by mouth at bedtime. , Disp: , Rfl:  .  benazepril (LOTENSIN) 20 MG tablet, Take 1 tablet (20 mg total) by mouth 2 (two) times daily., Disp: 180 tablet, Rfl: 3 .  Calcium Carbonate-Vitamin D  (CALCIUM 600 + D PO), Take 1 tablet by mouth 2 (two) times daily.  , Disp: , Rfl:  .  Cholecalciferol (VITAMIN D) 2000 units CAPS, Take 2,000 Units by mouth daily., Disp: , Rfl:  .  diphenhydramine-acetaminophen (TYLENOL PM) 25-500 MG TABS tablet, Take 3 tablets by mouth every morning., Disp: , Rfl:  .  ezetimibe (ZETIA) 10 MG tablet, Take 1 tablet (10 mg total) by mouth daily., Disp: 90 tablet, Rfl: 2 .  fish oil-omega-3 fatty acids 1000 MG capsule, Take 1 g by mouth every evening. , Disp: , Rfl:  .  gabapentin (NEURONTIN) 600 MG tablet, Take 600 mg by mouth 2 (two) times daily., Disp: , Rfl:  .  Glucosamine-Chondroit-Vit C-Mn (GLUCOSAMINE CHONDR 1500 COMPLX PO), Take 2 tablets by mouth daily., Disp: , Rfl:  .  levothyroxine (SYNTHROID, LEVOTHROID) 75 MCG tablet, Take 75 mcg by mouth daily before breakfast. , Disp: , Rfl:  .  Liniments (SALONPAS PAIN RELIEF PATCH EX), Place 1 patch onto the skin daily as needed (pain)., Disp: , Rfl:  .  loratadine-pseudoephedrine (CLARITIN-D 24-HOUR) 10-240 MG per 24 hr tablet, Take 1 tablet by mouth every evening. , Disp: , Rfl:  .  meclizine (ANTIVERT) 25 MG tablet, Take 25 mg by mouth 3 (three) times daily as needed for dizziness., Disp: , Rfl:  .  Melatonin  5 MG CAPS, Take 5 mg by mouth at bedtime. , Disp: , Rfl:  .  metoprolol tartrate (LOPRESSOR) 25 MG tablet, Take 0.5 tablets (12.5 mg total) by mouth 2 (two) times daily., Disp: 90 tablet, Rfl: 3 .  NON FORMULARY, Place 1 drop under the tongue every morning. CBD Oil, Disp: , Rfl:  .  omeprazole (PRILOSEC) 20 MG capsule, Take 20 mg by mouth daily with breakfast. , Disp: , Rfl:  .  polyethylene glycol (MIRALAX / GLYCOLAX) packet, Take 17 g by mouth daily as needed for mild constipation. , Disp: , Rfl:  .  potassium chloride (KLOR-CON) 10 MEQ tablet, Take 1 tablet (10 mEq total) by mouth daily., Disp: 90 tablet, Rfl: 3 .  rosuvastatin (CRESTOR) 40 MG tablet, Take 1 tablet (40 mg total) by mouth at bedtime.,  Disp: 90 tablet, Rfl: 3 .  triamterene-hydrochlorothiazide (MAXZIDE-25) 37.5-25 MG tablet, Take 1 tablet by mouth daily., Disp: , Rfl: 1 .  docusate sodium (COLACE) 100 MG capsule, Take 100 mg by mouth daily. , Disp: , Rfl:   Social History   Tobacco Use  Smoking Status Former Smoker  . Packs/day: 1.00  . Years: 30.00  . Pack years: 30.00  . Types: Cigarettes  . Start date: 40  . Quit date: 08/13/1993  . Years since quitting: 25.6  Smokeless Tobacco Never Used    Allergies  Allergen Reactions  . Penicillins Other (See Comments)    Allergy as an infant - no other information available Has patient had a PCN reaction causing immediate rash, facial/tongue/throat swelling, SOB or lightheadedness with hypotension: Unknown Has patient had a PCN reaction causing severe rash involving mucus membranes or skin necrosis: Unknown Has patient had a PCN reaction that required hospitalization: No Has patient had a PCN reaction occurring within the last 10 years: No If all of the above answers are "NO", then may proceed with Cephalospor  . Pravastatin Other (See Comments)    Effects liver function   Objective:  There were no vitals filed for this visit. There is no height or weight on file to calculate BMI. Constitutional Well developed. Well nourished.  Vascular Dorsalis pedis pulses palpable bilaterally. Posterior tibial pulses palpable bilaterally. Capillary refill normal to all digits.  No cyanosis or clubbing noted. Pedal hair growth normal.  Neurologic Normal speech. Oriented to person, place, and time. Epicritic sensation to light touch grossly present bilaterally.  Dermatologic Nails well groomed and normal in appearance. No open wounds. No skin lesions.  Orthopedic:  Right fifth digit pain on palpation to the lateral aspect of the digit at the hyperkeratotic lesion/corn.  Mild pain on the left fifth digit as well.  There is adductovarus rotation with a hammertoe contracture of  the right fifth digit noted.  Semiflexible in nature.  No pain at the metatarsophalangeal joint.  No tailor's bunion deformity noted.   Radiographs: None Assessment:   1. Acquired hammertoe of right foot   2. Pain in toe of right foot    Plan:  Patient was evaluated and treated and all questions answered.  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  Given that the patient has a lot of acute inflammatory pain from constant irritation to that area I believe patient will benefit from a steroid injection.  Patient agrees with the plan would like to proceed with a steroid injection.  I did explain to the  patient the long-term management of surgical correction of realignment of the fifth digit to be rotated out of a varus rotation and fix the hammertoe contracture.  Patient would like to try the injection first and see if there is any relief otherwise she will consider surgical intervention. -If the injection does not give relief I will consider surgical intervention.  Please plan on obtaining x-rays if surgical intervention is indicated. -A steroid injection was performed at right fifth digit using 1% plain Lidocaine and 10 mg of Kenalog. This was well tolerated.   No follow-ups on file.

## 2019-04-29 ENCOUNTER — Telehealth: Payer: Self-pay | Admitting: *Deleted

## 2019-04-29 NOTE — Telephone Encounter (Signed)
   [facility] Pre-operative Risk Assessment    Request for surgical clearance:  1. What type of surgery is being performed? RIGHT SHOULDER SURGERY   2. When is this surgery scheduled? 06/05/19   3. What type of clearance is required (medical clearance vs. Pharmacy clearance to hold med vs. Both)? MEDICAL  4. Are there any medications that need to be held prior to surgery and how long? ASA   5. Practice name and name of physician performing surgery? [facility]; DR. Lennette Bihari SUPPLE   6. What is your office phone number [phone]    7.   What is your office fax number [phone]  8.   Anesthesia type (None, local, MAC, general) ? GENERAL    Julaine Hua 04/29/2019, 1:33 PM  _________________________________________________________________   (provider comments below)

## 2019-04-29 NOTE — Telephone Encounter (Signed)
Left VM to call back as she has not been seen since October of 2020.

## 2019-05-01 NOTE — Telephone Encounter (Signed)
   Primary [place], MD  Chart reviewed as part of pre-operative protocol coverage. Because of CAIDYN RUNSER past medical history and time since last visit, he/she will require a follow-up visit in order to better assess preoperative cardiovascular risk.  Pre-op covering staff: - Please schedule appointment and call patient to inform them. - Please contact requesting surgeon's office via preferred method (i.e, phone, fax) to inform them of need for appointment prior to surgery.  If applicable, this message will also be routed to pharmacy pool and/or primary cardiologist for input on holding anticoagulant/antiplatelet agent as requested below so that this information is available at time of patient's appointment.   Cecilie Kicks, NP  05/01/2019, 11:30 AM

## 2019-05-01 NOTE — Telephone Encounter (Signed)
Pt has been notified of needing an appt for pre op clearance. Pt has been scheduled to see Ermalinda Barrios, [facility] 05/27/19 @ 10:15. I will send note to [facility] for upcoming appt. I will send note to surgeon Dr. Justice Britain pt has appt with cardiology 05/27/19 for pre op clearance. I will remove from the pre op call back pool.

## 2019-05-14 ENCOUNTER — Ambulatory Visit: Payer: Medicare Other | Admitting: Podiatry

## 2019-05-14 ENCOUNTER — Other Ambulatory Visit: Payer: Self-pay

## 2019-05-14 DIAGNOSIS — M2041 Other hammer toe(s) (acquired), right foot: Secondary | ICD-10-CM | POA: Diagnosis not present

## 2019-05-14 DIAGNOSIS — M7751 Other enthesopathy of right foot: Secondary | ICD-10-CM | POA: Diagnosis not present

## 2019-05-14 DIAGNOSIS — M79674 Pain in right toe(s): Secondary | ICD-10-CM

## 2019-05-15 ENCOUNTER — Encounter: Payer: Self-pay | Admitting: Podiatry

## 2019-05-15 NOTE — Progress Notes (Signed)
Subjective:  Patient ID: Gabrielle [name], female    DOB: 01/31/1945,  MRN: [id number]  Chief Complaint  Patient presents with  . Callouses    pt is here for a callous f/u of the right foot 5th toe lateral side, pt states that she is doing a lot better since the last time she was here, pt shows no signs of infection. Pain is minimal as well, pain is now a 4 out of 10 on the pain scale. Please advise.    74 y.o. female presents with the above complaint.  Patient presents with a follow-up of hyperkeratotic lesion on the fifth digit lateral aspect likely due to the adductovarus nature of the digit.  Patient states her pain has completely improved.  She states the injection helped a lot.  She said wider shoe gear modification/open to has also helped considerably.  She denies any other acute complaints.   Review of Systems: Negative except as noted in the HPI. Denies N/V/F/Ch.  Past Medical History:  Diagnosis Date  . Anemia   . Arthritis 01-17-11   degenerative disc disease-all joints  . Bursitis   . Cholecystitis with cholelithiasis 03/22/2018  . Cholesterol serum elevated 01-17-11   tx. meds  . GERD (gastroesophageal reflux disease) 01-17-11   tx. omeprazole  . Heart murmur    slight murmur  . High cholesterol   . Hypertension 01-17-11   tx. meds  . Hypothyroidism   . Leg pain    ABIs 2/18: normal bilaterally  . Neuropathy    FEET  . Osteoarthritis   . Peripheral neuropathy   . Pulmonary nodule   . Seasonal allergies   . Sinus problem 01-17-11   tx. Claritin D  . ST elevation (STEMI) myocardial infarction involving left circumflex coronary artery ([place]) 08/21/2015    Current Outpatient Medications:  .  aspirin EC 81 MG tablet, Take 81 mg by mouth at bedtime. , Disp: , Rfl:  .  benazepril (LOTENSIN) 20 MG tablet, Take 1 tablet (20 mg total) by mouth 2 (two) times daily., Disp: 180 tablet, Rfl: 3 .  Calcium Carbonate-Vitamin D (CALCIUM 600 + D PO), Take 1 tablet by mouth 2  (two) times daily.  , Disp: , Rfl:  .  Cholecalciferol (VITAMIN D) 2000 units CAPS, Take 2,000 Units by mouth daily., Disp: , Rfl:  .  diphenhydramine-acetaminophen (TYLENOL PM) 25-500 MG TABS tablet, Take 3 tablets by mouth every morning., Disp: , Rfl:  .  ezetimibe (ZETIA) 10 MG tablet, Take 1 tablet (10 mg total) by mouth daily., Disp: 90 tablet, Rfl: 2 .  fish oil-omega-3 fatty acids 1000 MG capsule, Take 1 g by mouth every evening. , Disp: , Rfl:  .  gabapentin (NEURONTIN) 600 MG tablet, Take 600 mg by mouth 2 (two) times daily., Disp: , Rfl:  .  Glucosamine-Chondroit-Vit C-Mn (GLUCOSAMINE CHONDR 1500 COMPLX PO), Take 2 tablets by mouth daily., Disp: , Rfl:  .  levothyroxine (SYNTHROID, LEVOTHROID) 75 MCG tablet, Take 75 mcg by mouth daily before breakfast. , Disp: , Rfl:  .  Liniments (SALONPAS PAIN RELIEF PATCH EX), Place 1 patch onto the skin daily as needed (pain)., Disp: , Rfl:  .  loratadine-pseudoephedrine (CLARITIN-D 24-HOUR) 10-240 MG per 24 hr tablet, Take 1 tablet by mouth every evening. , Disp: , Rfl:  .  meclizine (ANTIVERT) 25 MG tablet, Take 25 mg by mouth 3 (three) times daily as needed for dizziness., Disp: , Rfl:  .  Melatonin 5 MG CAPS, Take  5 mg by mouth at bedtime. , Disp: , Rfl:  .  metoprolol tartrate (LOPRESSOR) 25 MG tablet, Take 0.5 tablets (12.5 mg total) by mouth 2 (two) times daily., Disp: 90 tablet, Rfl: 3 .  NON FORMULARY, Place 1 drop under the tongue every morning. CBD Oil, Disp: , Rfl:  .  omeprazole (PRILOSEC) 20 MG capsule, Take 20 mg by mouth daily with breakfast. , Disp: , Rfl:  .  polyethylene glycol (MIRALAX / GLYCOLAX) packet, Take 17 g by mouth daily as needed for mild constipation. , Disp: , Rfl:  .  potassium chloride (KLOR-CON) 10 MEQ tablet, Take 1 tablet (10 mEq total) by mouth daily., Disp: 90 tablet, Rfl: 3 .  rosuvastatin (CRESTOR) 40 MG tablet, Take 1 tablet (40 mg total) by mouth at bedtime., Disp: 90 tablet, Rfl: 3 .   triamterene-hydrochlorothiazide (MAXZIDE-25) 37.5-25 MG tablet, Take 1 tablet by mouth daily., Disp: , Rfl: 1 .  docusate sodium (COLACE) 100 MG capsule, Take 100 mg by mouth daily. , Disp: , Rfl:   Social History   Tobacco Use  Smoking Status Former Smoker  . Packs/day: 1.00  . Years: 30.00  . Pack years: 30.00  . Types: Cigarettes  . Start date: 6  . Quit date: 08/13/1993  . Years since quitting: 25.7  Smokeless Tobacco Never Used    Allergies  Allergen Reactions  . Penicillins Other (See Comments)    Allergy as an infant - no other information available Has patient had a PCN reaction causing immediate rash, facial/tongue/throat swelling, SOB or lightheadedness with hypotension: Unknown Has patient had a PCN reaction causing severe rash involving mucus membranes or skin necrosis: Unknown Has patient had a PCN reaction that required hospitalization: No Has patient had a PCN reaction occurring within the last 10 years: No If all of the above answers are "NO", then may proceed with Cephalospor  . Pravastatin Other (See Comments)    Effects liver function   Objective:  There were no vitals filed for this visit. There is no height or weight on file to calculate BMI. Constitutional Well developed. Well nourished.  Vascular Dorsalis pedis pulses palpable bilaterally. Posterior tibial pulses palpable bilaterally. Capillary refill normal to all digits.  No cyanosis or clubbing noted. Pedal hair growth normal.  Neurologic Normal speech. Oriented to person, place, and time. Epicritic sensation to light touch grossly present bilaterally.  Dermatologic Nails well groomed and normal in appearance. No open wounds. No skin lesions.  Orthopedic:  Right fifth digit mild pain on palpation to the lateral aspect of the digit at the hyperkeratotic lesion/corn.  Mild pain on the left fifth digit as well.  There is adductovarus rotation with a hammertoe contracture of the right fifth digit  noted.  Semiflexible in nature.  No pain at the metatarsophalangeal joint.  No tailor's bunion deformity noted.   Radiographs: None Assessment:   1. Acquired hammertoe of right foot   2. Pain in toe of right foot   3. Capsulitis of toe of right foot    Plan:  Patient was evaluated and treated and all questions answered.  Right fifth digit hammertoe contracture with adductovarus rotation/capsulitis -Patient pain has clinically improved.  Patient states the injection helped her considerably.  She also made shoe gear modifications which helped her tremendously as well.  Her pain scale is now much more manageable.  Therefore I will hold off on any further surgical intervention for now.   No follow-ups on file.

## 2019-05-21 NOTE — Progress Notes (Signed)
Cardiology Office Note    Date:  05/27/2019   ID:  Gabrielle [name], DOB [birth year], MRN [id number]  PCP:  Hulan Fess, MD  Cardiologist: Sherren Mocha, MD EPS: None  Chief Complaint  Patient presents with  . Pre-op Exam    History of Present Illness:  Gabrielle [name] is a 74 y.o. female with history of CAD status post inferolateral STEMI 2017 treated with DES to OM1 and staged DES RCA, follow-up echo EF 60 to 65% normal wall motion. Hypertension, HLD, hypothyroidism, pulmonary nodule.  Patient last saw Sande Rives, PA-C 11/13/18 and was doing well. LDL was up from 67-80 after addition of Zetia and lifestyle modifications were recommended. Had PET scan 12/2018 because of weight loss but no neuroendocrine tumor or thoracic malignancy but noted small nodule in the left upper lobe was stable.  Patient is here for preoperative clearance before undergoing total shoulder arthroplasty 06/05/19 by Dr. Onnie Graham.  Patient denies chest pain, dyspnea, dizziness, palpitations, or presyncope. Limited by arthritis. Rides a recumbent bike 3 times a week for 30 min. Just got back from the beach and road a bike daily-45 min.   Past Medical History:  Diagnosis Date  . Anemia   . Arthritis 01-17-11   degenerative disc disease-all joints  . Bursitis   . Cholecystitis with cholelithiasis 03/22/2018  . Cholesterol serum elevated 01-17-11   tx. meds  . GERD (gastroesophageal reflux disease) 01-17-11   tx. omeprazole  . Heart murmur    slight murmur  . High cholesterol   . Hypertension 01-17-11   tx. meds  . Hypothyroidism   . Leg pain    ABIs 2/18: normal bilaterally  . Neuropathy    FEET  . Osteoarthritis   . Peripheral neuropathy   . Pre-diabetes   . Pulmonary nodule   . Seasonal allergies   . Sinus problem 01-17-11   tx. Claritin D  . ST elevation (STEMI) myocardial infarction involving left circumflex coronary artery ([place]) 08/21/2015    Past Surgical History:  Procedure  Laterality Date  . ABDOMINAL HYSTERECTOMY  01-17-11   '93-Hysterectomy-heavy bleeding  . ANTERIOR LATERAL LUMBAR FUSION WITH PERCUTANEOUS SCREW 1 LEVEL N/A 03/07/2017   Procedure: XLIF L3-4;  Surgeon: Melina Schools, MD;  Location: [facility];  Service: Orthopedics;  Laterality: N/A;  4 hrs  . APPENDECTOMY    . BACK SURGERY  01-17-11   04-20-10-T-Lift lumbar fusion  . [facility] PROCEDURE  01-17-11   Bladder sling  . CARDIAC CATHETERIZATION N/A 08/21/2015   Procedure: Left Heart Cath and Coronary Angiography;  Surgeon: Sherren Mocha, MD;  Location: [facility];  Service: Cardiovascular;  Laterality: N/A;  . CARDIAC CATHETERIZATION N/A 08/21/2015   Procedure: Coronary Stent Intervention;  Surgeon: Sherren Mocha, MD;  Location: [facility];  Service: Cardiovascular;  Laterality: N/A;  . CARDIAC CATHETERIZATION N/A 08/23/2015   Procedure: Coronary Stent Intervention;  Surgeon: Leonie Man, MD;  Location: [facility];  Service: Cardiovascular;  Laterality: N/A;  promus 2.5x16 in RCA  . CARDIAC CATHETERIZATION N/A 08/23/2015   Procedure: Left Heart Cath and Coronary Angiography;  Surgeon: Leonie Man, MD;  Location: [facility];  Service: Cardiovascular;  Laterality: N/A;  . CARPAL TUNNEL RELEASE  01-17-11   '02-left, also right  . CHOLECYSTECTOMY N/A 03/22/2018   Procedure: LAPAROSCOPIC CHOLECYSTECTOMY;  Surgeon: Fanny Skates, MD;  Location: [facility];  Service: General;  Laterality: N/A;  . COLONOSCOPY    . HARDWARE REMOVAL Right  05/15/2013   Procedure: REMOVAL RIGHT L4-L5 PEDICLE SCREWS AND ROD ;  Surgeon: Melina Schools, MD;  Location: [facility];  Service: Orthopedics;  Laterality: Right;  . JOINT REPLACEMENT  01-17-11    RTKA' 02  . LAPAROSCOPIC APPENDECTOMY N/A 08/13/2013   Procedure: APPENDECTOMY LAPAROSCOPIC;  Surgeon: Pedro Earls, MD;  Location: [facility];  Service: General;  Laterality: N/A;  . TONSILLECTOMY  01-17-11   child  . TOTAL KNEE ARTHROPLASTY  01/19/2011    Procedure: TOTAL KNEE ARTHROPLASTY;  Surgeon: Johnn Hai;  Location: [facility];  Service: Orthopedics;  Laterality: Left;    Current Medications: Current Meds  Medication Sig  . acetaminophen (TYLENOL) 650 MG CR tablet Take 1,950 mg by mouth daily.  Marland Kitchen aspirin EC 81 MG tablet Take 81 mg by mouth at bedtime.   . benazepril (LOTENSIN) 20 MG tablet Take 1 tablet (20 mg total) by mouth 2 (two) times daily.  . Calcium Carbonate-Vitamin D (CALCIUM 600 + D PO) Take 1 tablet by mouth 2 (two) times daily.    . Cholecalciferol (VITAMIN D) 2000 units CAPS Take 2,000 Units by mouth daily.  Marland Kitchen docusate sodium (COLACE) 100 MG capsule Take 100 mg by mouth daily.   Marland Kitchen ezetimibe (ZETIA) 10 MG tablet Take 1 tablet (10 mg total) by mouth daily.  . fish oil-omega-3 fatty acids 1000 MG capsule Take 1 g by mouth every evening.   . gabapentin (NEURONTIN) 600 MG tablet Take 600 mg by mouth 2 (two) times daily.  . Glucosamine-Chondroit-Vit C-Mn (GLUCOSAMINE CHONDR 1500 COMPLX PO) Take 2 tablets by mouth daily. 1500/1200  . levothyroxine (SYNTHROID, LEVOTHROID) 75 MCG tablet Take 75 mcg by mouth daily before breakfast.   . Liniments (SALONPAS PAIN RELIEF PATCH EX) Place 1 patch onto the skin daily as needed (pain).  Marland Kitchen loratadine-pseudoephedrine (CLARITIN-D 24-HOUR) 10-240 MG per 24 hr tablet Take 1 tablet by mouth every evening.   . meclizine (ANTIVERT) 25 MG tablet Take 25 mg by mouth 3 (three) times daily as needed for dizziness.  . Melatonin 10 MG TABS Take 10 mg by mouth at bedtime.   . metoprolol tartrate (LOPRESSOR) 25 MG tablet Take 0.5 tablets (12.5 mg total) by mouth 2 (two) times daily.  . nitroGLYCERIN (NITROSTAT) 0.4 MG SL tablet Place 0.4 mg under the tongue every 5 (five) minutes as needed for chest pain.  . NON FORMULARY Place 1 drop under the tongue daily. CBD Oil  . omeprazole (PRILOSEC) 20 MG capsule Take 20 mg by mouth daily with breakfast.   . polyethylene glycol (MIRALAX / GLYCOLAX) packet Take 17  g by mouth daily as needed for mild constipation.   . potassium chloride (KLOR-CON) 10 MEQ tablet Take 1 tablet (10 mEq total) by mouth daily.  . rosuvastatin (CRESTOR) 40 MG tablet Take 1 tablet (40 mg total) by mouth at bedtime.  . triamterene-hydrochlorothiazide (MAXZIDE-25) 37.5-25 MG tablet Take 1 tablet by mouth daily.     Allergies:   Penicillins and Pravastatin   Social History   Socioeconomic History  . Marital status: Married    Spouse name: Not on file  . Number of children: 3  . Years of education: BSN+  . Highest education level: Not on file  Occupational History  . Occupation: Retired  Tobacco Use  . Smoking status: Former Smoker    Packs/day: 1.00    Years: 30.00    Pack years: 30.00    Types: Cigarettes    Start date: 1965  Quit date: 08/13/1993    Years since quitting: 25.8  . Smokeless tobacco: Never Used  Substance and Sexual Activity  . Alcohol use: Yes    Alcohol/week: 2.0 standard drinks    Types: 2 Glasses of wine per week    Comment: per day  . Drug use: No  . Sexual activity: Not Currently    Partners: Male    Comment: Married  Other Topics Concern  . Not on file  Social History Narrative   Lives at home w/ her husband and granddaughter   Right-handed   Caffeine: 3 cups of tea daily   Social Determinants of Health   Financial Resource Strain:   . Difficulty of Paying Living Expenses:   Food Insecurity:   . Worried About Charity fundraiser in the Last Year:   . Arboriculturist in the Last Year:   Transportation Needs:   . Film/video editor (Medical):   Marland Kitchen Lack of Transportation (Non-Medical):   Physical Activity:   . Days of Exercise per Week:   . Minutes of Exercise per Session:   Stress:   . Feeling of Stress :   Social Connections:   . Frequency of Communication with Friends and Family:   . Frequency of Social Gatherings with Friends and Family:   . Attends Religious Services:   . Active Member of Clubs or Organizations:    . Attends Archivist Meetings:   Marland Kitchen Marital Status:      Family History:  The patient's family history includes Aneurysm in her mother; Diabetes in her brother and sister; Heart disease in her brother and father; Hyperlipidemia in her brother and sister; Hypertension in her brother and sister.   ROS:   Please see the history of present illness.    ROS All other systems reviewed and are negative.   PHYSICAL EXAM:   VS:  BP 128/70 (BP Location: Left Arm, Patient Position: Sitting, Cuff Size: Normal)   Pulse 76   Ht 5' (1.524 m)   Wt 183 lb (83 kg)   SpO2 97%   BMI 35.74 kg/m   Physical Exam  GEN: Overweight, in no acute distress  Neck: no JVD, carotid bruits, or masses 99991111 systolic murmur LSB  Respiratory:  clear to auscultation bilaterally, normal work of breathing GI: soft, nontender, nondistended, + BS Ext: without cyanosis, clubbing, or edema Neuro:  Alert and Oriented x 3 Psych: euthymic mood, full affect  Wt Readings from Last 3 Encounters:  05/27/19 183 lb (83 kg)  05/23/19 178 lb (80.7 kg)  01/03/19 181 lb 14.4 oz (82.5 kg)      Studies/Labs Reviewed:   EKG:  EKG is  ordered today.  The ekg ordered today demonstrates NSR with LVH LAFB, left axis deviation  Recent Labs: 01/29/2019: ALT 47; BUN 24; Creatinine 1.13; Hemoglobin 12.7; Platelet Count 192; Potassium 4.5; Sodium 133   Lipid Panel    Component Value Date/Time   CHOL 180 10/12/2015 0835   TRIG 138 10/12/2015 0835   HDL 74 10/12/2015 0835   CHOLHDL 2.4 10/12/2015 0835   VLDL 28 10/12/2015 0835   LDLCALC 78 10/12/2015 0835    Additional studies/ records that were reviewed today include:  Echocardiogram 08/21/2015:  Mid RCA lesion, 85% stenosed.  Ost LM lesion, 40% stenosed.  Prox LAD to Mid LAD lesion, 25% stenosed.  Ost Cx to Prox Cx lesion, 70% stenosed.  There is mild left ventricular systolic dysfunction.  1st Mrg lesion, 100%  stenosed. Post intervention, there is  a 0% residual stenosis.   1. Acute inferolateral STEMI secondary to total occlusion of the first OM branch. Is treated successfully with primary PCI. 2. Mild nonobstructive disease of the left main and LAD 3. Severe stenosis of the mid RCA: Recommend staged PCI prior to discharge 4. Mild segmental contraction abnormality the left ventricle with preserved overall LVEF   Recommendations: Aspirin and brilinta 12 months, aggressive medical therapy, staged PCI the right coronary artery on Monday. _______________   Echocardiogram 08/23/2015: Study Conclusions: - Left ventricle: The cavity size was normal. Systolic function was   normal. The estimated ejection fraction was in the range of 55%   to 60%. Wall motion was normal; there were no regional wall   motion abnormalities. Left ventricular diastolic function   parameters were normal. - Atrial septum: No defect or patent foramen ovale was identified.   _______________   Coronary Stent Intervention 08/23/2015:  Mid RCA lesion, 85 %stenosed.  A STENT PROMUS PREM MR 2.5X16 drug eluting stent was successfully placed. Post intervention, there is a 0% residual stenosis.  1st Mrg stent appears to be widely patent  Ost Cx to Prox Cx lesion, 70 %stenosed.   Successful focal PCI of mid RCA lesion with DES stent. Reviewed with Dr. Burt Knack, we both feel that the osteo-proximal circumflex lesion is a very difficult lesion to consider PCI as there is really minimal landing zone for stent.  This also somewhat calcified and likely chronic. The patient had not been having anginal symptoms prior to coming in, therefore it is unlikely to be flow-limiting. Plan for now is medical management.       ASSESSMENT:    1. Preoperative clearance   2. Coronary artery disease involving native coronary artery of native heart without angina pectoris   3. Essential hypertension   4. Hyperlipidemia, unspecified hyperlipidemia type   5. Pulmonary nodules   6.  Morbid obesity ([place])      PLAN:  In order of problems listed above:  Preoperative clearance before undergoing shoulder arthroscopy by Dr. Justice Britain 06/05/19 Patient with history of STEMI 2017 treated with DES OM1 and RCA-doing well without angina. Exercises regularly and mets is greater than 6. No cardiac work up necessary prior to surgery. She can hold ASA for 3 days prior to surgery.   According to the Revised Cardiac Risk Index (RCRI), her Perioperative Risk of Major Cardiac Event is (%): 0.9  Her Functional Capacity in METs is: 6.27 according to the [employer] Activity Status Index (DASI).    CAD status post inferior STEMI 2017 treated with DES to the OM1 and staged DES to the RCA, normal LVEF post MI-no angina on ASA, crestor and zetia and metoprolol  Essential hypertension-well contolled  Hyperlipidemia-labs done at [facility] last week on crestor and zetia- LDL pending but chol and HDL normal  Nodules being followed closely by Dr. Loanne Drilling for repeat CT 07/2019  Obesity-patient offered weight loss center referral or weight watchers but wants to wait until after her shoulder surgery.  Medication Adjustments/Labs and Tests Ordered: Current medicines are reviewed at length with the patient today.  Concerns regarding medicines are outlined above.  Medication changes, Labs and Tests ordered today are listed in the Patient Instructions below. Patient Instructions  Medication Instructions:  Your physician recommends that you continue on your current medications as directed. Please refer to the Current Medication list given to you today.  *If you need a refill on your cardiac medications before  your next appointment, please call your pharmacy*   Lab Work: -None If you have labs (blood work) drawn today and your tests are completely normal, you will receive your results only by: Marland Kitchen MyChart Message (if you have MyChart) OR . A paper copy in the mail If you have any lab test that is abnormal or  we need to change your treatment, we will call you to review the results.   Testing/Procedures: -None   Follow-Up: At [facility], you and your health needs are our priority.  As part of our continuing mission to provide you with exceptional heart care, we have created designated Provider Care Teams.  These Care Teams include your primary Cardiologist (physician) and Advanced Practice Providers (APPs -  Physician Assistants and Nurse Practitioners) who all work together to provide you with the care you need, when you need it.  We recommend signing up for the patient portal called "MyChart".  Sign up information is provided on this After Visit Summary.  MyChart is used to connect with patients for Virtual Visits (Telemedicine).  Patients are able to view lab/test results, encounter notes, upcoming appointments, etc.  Non-urgent messages can be sent to your provider as well.   To learn more about what you can do with MyChart, go to [email or web address].    Your next appointment:   6 month(s)  The format for your next appointment:   Either In Person or Virtual  Provider:   Dr. Burt Knack   Other Instructions      Signed, Ermalinda Barrios, PA-C  05/27/2019 10:44 AM    [address], [place], [place]  [address] Phone: [phone]; Fax: [phone]

## 2019-05-22 NOTE — Patient Instructions (Addendum)
DUE TO COVID-19 ONLY ONE VISITOR IS ALLOWED TO COME WITH YOU AND STAY IN THE WAITING ROOM ONLY DURING PRE OP AND PROCEDURE DAY OF SURGERY. TWO VISITOR MAY VISIT WITH YOU AFTER SURGERY IN YOUR PRIVATE ROOM DURING VISITING HOURS ONLY!   10a-8p  YOU NEED TO HAVE A COVID 19 TEST ON__5-3-21_____ @__10 :00 am_____, THIS TEST MUST BE DONE BEFORE SURGERY, COME  [address], [place] , [address].  ([place]) ONCE YOUR COVID TEST IS COMPLETED, PLEASE BEGIN THE QUARANTINE INSTRUCTIONS AS OUTLINED IN YOUR HANDOUT.                TANESSA MANYGOATS  05/22/2019   Your procedure is scheduled on: 06-05-19   Report to [facility] Main  Entrance   Report to short stay  at       0530 AM     Call this number if you have problems the morning of surgery [phone]    Remember: NO SOLID FOOD AFTER MIDNIGHT THE NIGHT PRIOR TO SURGERY. NOTHING BY MOUTH EXCEPT CLEAR LIQUIDS UNTIL   0430 am  . PLEASE FINISH ENSURE DRINK PER SURGEON ORDER  WHICH NEEDS TO BE COMPLETED AT       0430 am then nothing by mouth .    CLEAR LIQUID DIET   Foods Allowed                                                                                   Foods Excluded  Coffee and tea, regular and decaf  NO CREAMER                           liquids that you cannot  Plain Jell-O any favor except red or purple                                            see through such as: Fruit ices (not with fruit pulp)                                                             milk, soups, orange juice  Iced Popsicles                                                             All solid food Carbonated beverages, regular and diet                                     Cranberry, grape and apple juices Sports drinks like Gatorade Lightly seasoned clear broth or  consume(fat free) Sugar, honey syrup  _____________________________________________________________________    BRUSH YOUR TEETH MORNING OF SURGERY AND RINSE YOUR MOUTH  OUT, NO CHEWING GUM CANDY OR MINTS.     Take these medicines the morning of surgery with A SIP OF WATER: omprazole, metoprolol, levothyroxine, gabapentin                                 You may not have any metal on your body including hair pins and              piercings  Do not wear jewelry, make-up, lotions, powders or perfumes, deodorant             Do not wear nail polish on your fingernails.  Do not shave  48 hours prior to surgery.         Do not bring valuables to the hospital. [place].  Contacts, dentures or bridgework may not be worn into surgery.      Patients discharged the day of surgery will not be allowed to drive home. IF YOU ARE HAVING SURGERY AND GOING HOME THE SAME DAY, YOU MUST HAVE AN ADULT TO DRIVE YOU HOME AND BE WITH YOU FOR 24 HOURS. YOU MAY GO HOME BY TAXI OR UBER OR ORTHERWISE, BUT AN ADULT MUST ACCOMPANY YOU HOME AND STAY WITH YOU FOR 24 HOURS.  Name and phone number of your driver:  Special Instructions: N/A              Please read over the following fact sheets you were given: _____________________________________________________________________  [facility]- Preparing for Total Shoulder Arthroplasty    Before surgery, you can play an important role. Because skin is not sterile, your skin needs to be as free of germs as possible. You can reduce the number of germs on your skin by using the following products. . Benzoyl Peroxide Gel o Reduces the number of germs present on the skin o Applied twice a day to shoulder area starting two days before surgery    ==================================================================  Please follow these instructions carefully:  BENZOYL PEROXIDE 5% GEL  Please do not use if you have an allergy to benzoyl peroxide.   If your skin becomes reddened/irritated stop using the benzoyl peroxide.  Starting two days before surgery, apply as follows: 1. Apply benzoyl  peroxide in the morning and at night. Apply after taking a shower. If you are not taking a shower clean entire shoulder front, back, and side along with the armpit with a clean wet washcloth.  2. Place a quarter-sized dollop on your shoulder and rub in thoroughly, making sure to cover the front, back, and side of your shoulder, along with the armpit.   2 days before ____ AM   ____ PM              1 day before ____ AM   ____ PM                         3. Do this twice a day for two days.  (Last application is the night before surgery, AFTER using the CHG soap as described below).  4. Do NOT apply benzoyl peroxide gel on the day of surgery.  [facility] - Preparing for Surgery Before surgery, you can play an important role.  Because skin is not sterile, your skin needs to be as free of germs as possible.  You can reduce the number of germs on your skin by washing with CHG (chlorahexidine gluconate) soap before surgery.  CHG is an antiseptic cleaner which kills germs and bonds with the skin to continue killing germs even after washing. Please DO NOT use if you have an allergy to CHG or antibacterial soaps.  If your skin becomes reddened/irritated stop using the CHG and inform your nurse when you arrive at Short Stay. Do not shave (including legs and underarms) for at least 48 hours prior to the first CHG shower.  You may shave your face/neck. Please follow these instructions carefully:  1.  Shower with CHG Soap the night before surgery and the  morning of Surgery.  2.  If you choose to wash your hair, wash your hair first as usual with your  normal  shampoo.  3.  After you shampoo, rinse your hair and body thoroughly to remove the  shampoo.                           4.  Use CHG as you would any other liquid soap.  You can apply chg directly  to the skin and wash                       Gently with a scrungie or clean washcloth.  5.  Apply the CHG Soap to your body ONLY FROM THE NECK DOWN.    Do not use on face/ open                           Wound or open sores. Avoid contact with eyes, ears mouth and genitals (private parts).                       Wash face,  Genitals (private parts) with your normal soap.             6.  Wash thoroughly, paying special attention to the area where your surgery  will be performed.  7.  Thoroughly rinse your body with warm water from the neck down.  8.  DO NOT shower/wash with your normal soap after using and rinsing off  the CHG Soap.                9.  Pat yourself dry with a clean towel.            10.  Wear clean pajamas.            11.  Place clean sheets on your bed the night of your first shower and do not  sleep with pets. Day of Surgery : Do not apply any lotions/deodorants the morning of surgery.  Please wear clean clothes to the hospital/surgery center.  FAILURE TO FOLLOW THESE INSTRUCTIONS MAY RESULT IN THE CANCELLATION OF YOUR SURGERY PATIENT SIGNATURE_________________________________  NURSE SIGNATURE__________________________________  ________________________________________________________________________   Adam Phenix  An incentive spirometer is a tool that can help keep your lungs clear and active. This tool measures how well you are filling your lungs with each breath. Taking long deep breaths may help reverse or decrease the chance of developing breathing (pulmonary) problems (especially infection) following:  A long period of time  when you are unable to move or be active. BEFORE THE PROCEDURE   If the spirometer includes an indicator to show your best effort, your nurse or respiratory therapist will set it to a desired goal.  If possible, sit up straight or lean slightly forward. Try not to slouch.  Hold the incentive spirometer in an upright position. INSTRUCTIONS FOR USE  1. Sit on the edge of your bed if possible, or sit up as far as you can in bed or on a chair. 2. Hold the incentive spirometer in an  upright position. 3. Breathe out normally. 4. Place the mouthpiece in your mouth and seal your lips tightly around it. 5. Breathe in slowly and as deeply as possible, raising the piston or the ball toward the top of the column. 6. Hold your breath for 3-5 seconds or for as long as possible. Allow the piston or ball to fall to the bottom of the column. 7. Remove the mouthpiece from your mouth and breathe out normally. 8. Rest for a few seconds and repeat Steps 1 through 7 at least 10 times every 1-2 hours when you are awake. Take your time and take a few normal breaths between deep breaths. 9. The spirometer may include an indicator to show your best effort. Use the indicator as a goal to work toward during each repetition. 10. After each set of 10 deep breaths, practice coughing to be sure your lungs are clear. If you have an incision (the cut made at the time of surgery), support your incision when coughing by placing a pillow or rolled up towels firmly against it. Once you are able to get out of bed, walk around indoors and cough well. You may stop using the incentive spirometer when instructed by your caregiver.  RISKS AND COMPLICATIONS  Take your time so you do not get dizzy or light-headed.  If you are in pain, you may need to take or ask for pain medication before doing incentive spirometry. It is harder to take a deep breath if you are having pain. AFTER USE  Rest and breathe slowly and easily.  It can be helpful to keep track of a log of your progress. Your caregiver can provide you with a simple table to help with this. If you are using the spirometer at home, follow these instructions: [place] IF:   You are having difficultly using the spirometer.  You have trouble using the spirometer as often as instructed.  Your pain medication is not giving enough relief while using the spirometer.  You develop fever of 100.5 F (38.1 C) or higher. SEEK IMMEDIATE MEDICAL CARE  IF:   You cough up bloody sputum that had not been present before.  You develop fever of 102 F (38.9 C) or greater.  You develop worsening pain at or near the incision site. MAKE SURE YOU:   Understand these instructions.  Will watch your condition.  Will get help right away if you are not doing well or get worse. Document Released: 05/29/2006 Document Revised: 04/10/2011 Document Reviewed: 07/30/2006 [facility] Patient Information 2014 [place], [place].   ________________________________________________________________________

## 2019-05-22 NOTE — Progress Notes (Signed)
PCP - Hulan Fess Cardiologist - Julaine Hua appt, 05-27-19@1015  for clearance cleared in epic   Chest x-ray - CT chest 11-15-18 epic EKG - 11-13-19 Stress Test -  ECHO -2017  Cardiac Cath -  CBC,CMP,HGBA1C 05-23-19 ON CHART  Sleep Study -  CPAP -   Fasting Blood Sugar -  Checks Blood Sugar _____ times a day  Blood Thinner Instructions: Aspirin Instructions:hold 81 mg asa 3 days Last Dose:  Anesthesia review: MI, pulm nodule CT chest epic  Patient denies shortness of breath, fever, cough and chest pain at PAT appointment   none   Patient verbalized understanding of instructions that were given to them at the PAT appointment. Patient was also instructed that they will need to review over the PAT instructions again at home before surgery.

## 2019-05-23 ENCOUNTER — Encounter (HOSPITAL_COMMUNITY)
Admission: RE | Admit: 2019-05-23 | Discharge: 2019-05-23 | Disposition: A | Discharge status: Home / Self Care | Payer: Medicare Other | Source: Ambulatory Visit | Attending: Orthopedic Surgery | Admitting: Orthopedic Surgery

## 2019-05-23 ENCOUNTER — Encounter (HOSPITAL_COMMUNITY): Payer: Self-pay

## 2019-05-23 ENCOUNTER — Other Ambulatory Visit: Payer: Self-pay

## 2019-05-23 DIAGNOSIS — Z01818 Encounter for other preprocedural examination: Secondary | ICD-10-CM | POA: Insufficient documentation

## 2019-05-23 HISTORY — DX: Prediabetes: R73.03

## 2019-05-27 ENCOUNTER — Encounter: Payer: Self-pay | Admitting: Physician Assistant

## 2019-05-27 ENCOUNTER — Other Ambulatory Visit: Payer: Self-pay

## 2019-05-27 ENCOUNTER — Ambulatory Visit: Payer: Medicare Other | Admitting: Physician Assistant

## 2019-05-27 VITALS — BP 128/70 | HR 76 | Ht 60.0 in | Wt 183.0 lb

## 2019-05-27 DIAGNOSIS — Z01818 Encounter for other preprocedural examination: Secondary | ICD-10-CM

## 2019-05-27 DIAGNOSIS — I2510 Atherosclerotic heart disease of native coronary artery without angina pectoris: Secondary | ICD-10-CM | POA: Diagnosis not present

## 2019-05-27 DIAGNOSIS — I10 Essential (primary) hypertension: Secondary | ICD-10-CM | POA: Diagnosis not present

## 2019-05-27 DIAGNOSIS — R918 Other nonspecific abnormal finding of lung field: Secondary | ICD-10-CM

## 2019-05-27 DIAGNOSIS — E785 Hyperlipidemia, unspecified: Secondary | ICD-10-CM

## 2019-05-27 NOTE — Patient Instructions (Signed)
Medication Instructions:  Your physician recommends that you continue on your current medications as directed. Please refer to the Current Medication list given to you today.  *If you need a refill on your cardiac medications before your next appointment, please call your pharmacy*   Lab Work: -None If you have labs (blood work) drawn today and your tests are completely normal, you will receive your results only by: Marland Kitchen MyChart Message (if you have MyChart) OR . A paper copy in the mail If you have any lab test that is abnormal or we need to change your treatment, we will call you to review the results.   Testing/Procedures: -None   Follow-Up: At [facility], you and your health needs are our priority.  As part of our continuing mission to provide you with exceptional heart care, we have created designated Provider Care Teams.  These Care Teams include your primary Cardiologist (physician) and Advanced Practice Providers (APPs -  Physician Assistants and Nurse Practitioners) who all work together to provide you with the care you need, when you need it.  We recommend signing up for the patient portal called "MyChart".  Sign up information is provided on this After Visit Summary.  MyChart is used to connect with patients for Virtual Visits (Telemedicine).  Patients are able to view lab/test results, encounter notes, upcoming appointments, etc.  Non-urgent messages can be sent to your provider as well.   To learn more about what you can do with MyChart, go to [email or web address].    Your next appointment:   6 month(s)  The format for your next appointment:   Either In Person or Virtual  Provider:   Dr. Burt Knack   Other Instructions

## 2019-05-28 ENCOUNTER — Encounter (HOSPITAL_COMMUNITY)
Admission: RE | Admit: 2019-05-28 | Discharge: 2019-05-28 | Disposition: A | Discharge status: Home / Self Care | Payer: Medicare Other | Source: Ambulatory Visit | Attending: Orthopedic Surgery | Admitting: Orthopedic Surgery

## 2019-05-28 DIAGNOSIS — Z79890 Hormone replacement therapy (postmenopausal): Secondary | ICD-10-CM | POA: Diagnosis not present

## 2019-05-28 DIAGNOSIS — Z01812 Encounter for preprocedural laboratory examination: Secondary | ICD-10-CM | POA: Insufficient documentation

## 2019-05-28 DIAGNOSIS — I252 Old myocardial infarction: Secondary | ICD-10-CM | POA: Diagnosis not present

## 2019-05-28 DIAGNOSIS — Z7982 Long term (current) use of aspirin: Secondary | ICD-10-CM | POA: Insufficient documentation

## 2019-05-28 DIAGNOSIS — Z79899 Other long term (current) drug therapy: Secondary | ICD-10-CM | POA: Insufficient documentation

## 2019-05-28 DIAGNOSIS — E039 Hypothyroidism, unspecified: Secondary | ICD-10-CM | POA: Insufficient documentation

## 2019-05-28 DIAGNOSIS — M19011 Primary osteoarthritis, right shoulder: Secondary | ICD-10-CM | POA: Insufficient documentation

## 2019-05-28 DIAGNOSIS — Z87891 Personal history of nicotine dependence: Secondary | ICD-10-CM | POA: Insufficient documentation

## 2019-05-28 DIAGNOSIS — I10 Essential (primary) hypertension: Secondary | ICD-10-CM | POA: Diagnosis not present

## 2019-05-28 DIAGNOSIS — E785 Hyperlipidemia, unspecified: Secondary | ICD-10-CM | POA: Insufficient documentation

## 2019-05-28 DIAGNOSIS — I2510 Atherosclerotic heart disease of native coronary artery without angina pectoris: Secondary | ICD-10-CM | POA: Insufficient documentation

## 2019-05-28 LAB — CBC
HCT: 40.2 % (ref 36.0–46.0)
Hemoglobin: 12.9 g/dL (ref 12.0–15.0)
MCH: 30.9 pg (ref 26.0–34.0)
MCHC: 32.1 g/dL (ref 30.0–36.0)
MCV: 96.2 fL (ref 80.0–100.0)
Platelets: 188 10*3/uL (ref 150–400)
RBC: 4.18 MIL/uL (ref 3.87–5.11)
RDW: 13 % (ref 11.5–15.5)
WBC: 6.8 10*3/uL (ref 4.0–10.5)
nRBC: 0 % (ref 0.0–0.2)

## 2019-05-28 LAB — BASIC METABOLIC PANEL
Anion gap: 8 (ref 5–15)
BUN: 30 mg/dL — ABNORMAL HIGH (ref 8–23)
CO2: 26 mmol/L (ref 22–32)
Calcium: 9.5 mg/dL (ref 8.9–10.3)
Chloride: 99 mmol/L (ref 98–111)
Creatinine, Ser: 1 mg/dL (ref 0.44–1.00)
GFR calc Af Amer: >60 mL/min (ref >60)
GFR calc non Af Amer: 56 mL/min — ABNORMAL LOW (ref >60)
Glucose, Bld: 123 mg/dL — ABNORMAL HIGH (ref 70–99)
Potassium: 4.8 mmol/L (ref 3.5–5.1)
Sodium: 133 mmol/L — ABNORMAL LOW (ref 135–145)

## 2019-05-28 LAB — SURGICAL PCR SCREEN
MRSA, PCR: NEGATIVE
Staphylococcus aureus: NEGATIVE

## 2019-05-30 NOTE — Progress Notes (Signed)
Anesthesia Chart Review   Case: [id number] Date/Time: 06/05/19 0715   Procedure: TOTAL SHOULDER ARTHROPLASTY (Right Shoulder) - 168min   Anesthesia type: General   Pre-op diagnosis: Right shoulder osteoarthritis   [facility] ROOM 06 / [facility]   Surgeons: Justice Britain, MD      DISCUSSION:74 y.o. former smoker (30 pack years, quit 08/13/1993) with h/o HTN, GERD, hypothyroidism, CAD (DES 08/23/2015), right shoulder OA scheduled for above procedure 06/05/2019 with Dr. Justice Britain.   Pt last seen by cardiology 05/27/2019 for preoperative evaluation.  Per OV note, "Preoperative clearance before undergoing shoulder arthroscopy by Dr. Justice Britain 06/05/19 Patient with history of STEMI 2017 treated with DES OM1 and RCA-doing well without angina. Exercises regularly and mets is greater than 6. No cardiac work up necessary prior to surgery. She can hold ASA for 3 days prior to surgery.  According to the Revised Cardiac Risk Index (RCRI), her Perioperative Risk of Major Cardiac Event is (%): 0.9 Her Functional Capacity in METs is: 6.27 according to the Duke Activity Status Index (DASI)."  Anticipate pt can proceed with planned procedure barring acute status change.   VS: BP 123/68   Pulse 80   Temp 36.9 C (Oral)   Resp 18   Ht 5' (1.524 m)   Wt 82.7 kg   SpO2 100%   BMI 35.62 kg/m   PROVIDERS: Hulan Fess, MD is PCP   Sherren Mocha, MD is Cardiologist  LABS: Labs reviewed: Acceptable for surgery. (all labs ordered are listed, but only abnormal results are displayed)  Labs Reviewed  BASIC METABOLIC PANEL - Abnormal; Notable for the following components:      Result Value   Sodium 133 (*)    Glucose, Bld 123 (*)    BUN 30 (*)    GFR calc non Af Amer 56 (*)    All other components within normal limits  SURGICAL PCR SCREEN  CBC     IMAGES:   EKG: 05/27/2019 Rate 76 bpm  NSR w/LVH Left axis deviation LAFB  CV: Echo 08/23/2015 Study Conclusions   - Left ventricle: The  cavity size was normal. Systolic function was  normal. The estimated ejection fraction was in the range of 55%  to 60%. Wall motion was normal; there were no regional wall  motion abnormalities. Left ventricular diastolic function  parameters were normal.  - Atrial septum: No defect or patent foramen ovale was identified.  Past Medical History:  Diagnosis Date  . Anemia   . Arthritis 01-17-11   degenerative disc disease-all joints  . Bursitis   . Cholecystitis with cholelithiasis 03/22/2018  . Cholesterol serum elevated 01-17-11   tx. meds  . GERD (gastroesophageal reflux disease) 01-17-11   tx. omeprazole  . Heart murmur    slight murmur  . High cholesterol   . Hypertension 01-17-11   tx. meds  . Hypothyroidism   . Leg pain    ABIs 2/18: normal bilaterally  . Neuropathy    FEET  . Osteoarthritis   . Peripheral neuropathy   . Pre-diabetes   . Pulmonary nodule   . Seasonal allergies   . Sinus problem 01-17-11   tx. Claritin D  . ST elevation (STEMI) myocardial infarction involving left circumflex coronary artery ([place]) 08/21/2015    Past Surgical History:  Procedure Laterality Date  . ABDOMINAL HYSTERECTOMY  01-17-11   '93-Hysterectomy-heavy bleeding  . ANTERIOR LATERAL LUMBAR FUSION WITH PERCUTANEOUS SCREW 1 LEVEL N/A 03/07/2017   Procedure: XLIF L3-4;  Surgeon: Rolena Infante,  Duane Lope, MD;  Location: [place];  Service: Orthopedics;  Laterality: N/A;  4 hrs  . APPENDECTOMY    . BACK SURGERY  01-17-11   04-20-10-T-Lift lumbar fusion  . [facility] PROCEDURE  01-17-11   Bladder sling  . CARDIAC CATHETERIZATION N/A 08/21/2015   Procedure: Left Heart Cath and Coronary Angiography;  Surgeon: Sherren Mocha, MD;  Location: [place] CV LAB;  Service: Cardiovascular;  Laterality: N/A;  . CARDIAC CATHETERIZATION N/A 08/21/2015   Procedure: Coronary Stent Intervention;  Surgeon: Sherren Mocha, MD;  Location: [place] CV LAB;  Service: Cardiovascular;  Laterality: N/A;  . CARDIAC  CATHETERIZATION N/A 08/23/2015   Procedure: Coronary Stent Intervention;  Surgeon: Leonie Man, MD;  Location: [place] CV LAB;  Service: Cardiovascular;  Laterality: N/A;  promus 2.5x16 in RCA  . CARDIAC CATHETERIZATION N/A 08/23/2015   Procedure: Left Heart Cath and Coronary Angiography;  Surgeon: Leonie Man, MD;  Location: [place] CV LAB;  Service: Cardiovascular;  Laterality: N/A;  . CARPAL TUNNEL RELEASE  01-17-11   '02-left, also right  . CHOLECYSTECTOMY N/A 03/22/2018   Procedure: LAPAROSCOPIC CHOLECYSTECTOMY;  Surgeon: Fanny Skates, MD;  Location: [place];  Service: General;  Laterality: N/A;  . COLONOSCOPY    . HARDWARE REMOVAL Right 05/15/2013   Procedure: REMOVAL RIGHT L4-L5 PEDICLE SCREWS AND ROD ;  Surgeon: Melina Schools, MD;  Location: [place];  Service: Orthopedics;  Laterality: Right;  . JOINT REPLACEMENT  01-17-11    RTKA' 02  . LAPAROSCOPIC APPENDECTOMY N/A 08/13/2013   Procedure: APPENDECTOMY LAPAROSCOPIC;  Surgeon: Pedro Earls, MD;  [facility];  Service: General;  Laterality: N/A;  . TONSILLECTOMY  01-17-11   child  . TOTAL KNEE ARTHROPLASTY  01/19/2011   Procedure: TOTAL KNEE ARTHROPLASTY;  Surgeon: Johnn Hai;  [facility];  Service: Orthopedics;  Laterality: Left;    MEDICATIONS: . acetaminophen (TYLENOL) 650 MG CR tablet  . aspirin EC 81 MG tablet  . benazepril (LOTENSIN) 20 MG tablet  . Calcium Carbonate-Vitamin D (CALCIUM 600 + D PO)  . Cholecalciferol (VITAMIN D) 2000 units CAPS  . docusate sodium (COLACE) 100 MG capsule  . ezetimibe (ZETIA) 10 MG tablet  . fish oil-omega-3 fatty acids 1000 MG capsule  . gabapentin (NEURONTIN) 600 MG tablet  . Glucosamine-Chondroit-Vit C-Mn (GLUCOSAMINE CHONDR 1500 COMPLX PO)  . levothyroxine (SYNTHROID, LEVOTHROID) 75 MCG tablet  . Liniments (SALONPAS PAIN RELIEF PATCH EX)  . loratadine-pseudoephedrine (CLARITIN-D 24-HOUR) 10-240 MG per 24 hr tablet  . meclizine (ANTIVERT) 25 MG tablet  .  Melatonin 10 MG TABS  . metoprolol tartrate (LOPRESSOR) 25 MG tablet  . nitroGLYCERIN (NITROSTAT) 0.4 MG SL tablet  . NON FORMULARY  . omeprazole (PRILOSEC) 20 MG capsule  . polyethylene glycol (MIRALAX / GLYCOLAX) packet  . potassium chloride (KLOR-CON) 10 MEQ tablet  . rosuvastatin (CRESTOR) 40 MG tablet  . triamterene-hydrochlorothiazide (MAXZIDE-25) 37.5-25 MG tablet   No current facility-administered medications for this encounter.    Maia Plan [facility] Pre-Surgical Testing [phone] 05/30/19  11:12 AM

## 2019-06-02 ENCOUNTER — Other Ambulatory Visit (HOSPITAL_COMMUNITY)
Admission: RE | Admit: 2019-06-02 | Discharge: 2019-06-02 | Disposition: A | Discharge status: Home / Self Care | Payer: Medicare Other | Source: Ambulatory Visit | Attending: Orthopedic Surgery | Admitting: Orthopedic Surgery

## 2019-06-02 DIAGNOSIS — Z01812 Encounter for preprocedural laboratory examination: Secondary | ICD-10-CM | POA: Diagnosis present

## 2019-06-02 DIAGNOSIS — Z20822 Contact with and (suspected) exposure to covid-19: Secondary | ICD-10-CM | POA: Diagnosis not present

## 2019-06-02 LAB — SARS CORONAVIRUS 2 (TAT 6-24 HRS): SARS Coronavirus 2: NEGATIVE

## 2019-06-04 NOTE — Anesthesia Preprocedure Evaluation (Addendum)
Anesthesia Evaluation  Patient identified by MRN, date of birth, ID band Patient awake    Reviewed: Allergy & Precautions, NPO status , Patient's Chart, lab work & pertinent test results  Airway Mallampati: II       Dental no notable dental hx. (+) Teeth Intact   Pulmonary former smoker,    Pulmonary exam normal breath sounds clear to auscultation       Cardiovascular hypertension, Pt. on home beta blockers and Pt. on medications + CAD and + Past MI  Normal cardiovascular exam Rhythm:Regular Rate:Normal     Neuro/Psych negative psych ROS   GI/Hepatic   Endo/Other  Hypothyroidism   Renal/GU      Musculoskeletal  (+) Arthritis , Osteoarthritis,    Abdominal (+) + obese,   Peds  Hematology   Anesthesia Other Findings   Reproductive/Obstetrics                            Anesthesia Physical Anesthesia Plan  ASA: III  Anesthesia Plan: General   Post-op Pain Management:  Regional for Post-op pain   Induction:   PONV Risk Score and Plan: 3 and Ondansetron, Dexamethasone and Midazolam  Airway Management Planned: Oral ETT  Additional Equipment: None  Intra-op Plan:   Post-operative Plan: Extubation in OR  Informed Consent: I have reviewed the patients History and Physical, chart, labs and discussed the procedure including the risks, benefits and alternatives for the proposed anesthesia with the patient or authorized representative who has indicated his/her understanding and acceptance.     Dental advisory given  Plan Discussed with: CRNA  Anesthesia Plan Comments:        Anesthesia Quick Evaluation

## 2019-06-05 ENCOUNTER — Ambulatory Visit (HOSPITAL_COMMUNITY): Payer: Medicare Other | Admitting: Physician Assistant

## 2019-06-05 ENCOUNTER — Other Ambulatory Visit: Payer: Self-pay

## 2019-06-05 ENCOUNTER — Ambulatory Visit (HOSPITAL_COMMUNITY)
Admission: RE | Admit: 2019-06-05 | Discharge: 2019-06-06 | Disposition: A | Discharge status: Home / Self Care | Payer: Medicare Other | Attending: Orthopedic Surgery | Admitting: Orthopedic Surgery

## 2019-06-05 ENCOUNTER — Encounter (HOSPITAL_COMMUNITY)
Admission: RE | Disposition: A | Discharge status: Home / Self Care | Payer: Self-pay | Source: Home / Self Care | Attending: Orthopedic Surgery

## 2019-06-05 ENCOUNTER — Ambulatory Visit (HOSPITAL_COMMUNITY): Payer: Medicare Other | Admitting: Certified Registered"

## 2019-06-05 ENCOUNTER — Encounter (HOSPITAL_COMMUNITY): Payer: Self-pay | Admitting: Orthopedic Surgery

## 2019-06-05 DIAGNOSIS — I252 Old myocardial infarction: Secondary | ICD-10-CM | POA: Diagnosis not present

## 2019-06-05 DIAGNOSIS — M19011 Primary osteoarthritis, right shoulder: Secondary | ICD-10-CM | POA: Diagnosis not present

## 2019-06-05 DIAGNOSIS — Z79899 Other long term (current) drug therapy: Secondary | ICD-10-CM | POA: Insufficient documentation

## 2019-06-05 DIAGNOSIS — I10 Essential (primary) hypertension: Secondary | ICD-10-CM | POA: Insufficient documentation

## 2019-06-05 DIAGNOSIS — E039 Hypothyroidism, unspecified: Secondary | ICD-10-CM | POA: Insufficient documentation

## 2019-06-05 DIAGNOSIS — Z79890 Hormone replacement therapy (postmenopausal): Secondary | ICD-10-CM | POA: Diagnosis not present

## 2019-06-05 DIAGNOSIS — E7800 Pure hypercholesterolemia, unspecified: Secondary | ICD-10-CM | POA: Insufficient documentation

## 2019-06-05 DIAGNOSIS — K219 Gastro-esophageal reflux disease without esophagitis: Secondary | ICD-10-CM | POA: Insufficient documentation

## 2019-06-05 DIAGNOSIS — Z7982 Long term (current) use of aspirin: Secondary | ICD-10-CM | POA: Diagnosis not present

## 2019-06-05 DIAGNOSIS — R7303 Prediabetes: Secondary | ICD-10-CM | POA: Diagnosis not present

## 2019-06-05 DIAGNOSIS — Z96611 Presence of right artificial shoulder joint: Secondary | ICD-10-CM | POA: Diagnosis present

## 2019-06-05 HISTORY — PX: TOTAL SHOULDER ARTHROPLASTY: SHX126

## 2019-06-05 SURGERY — ARTHROPLASTY, SHOULDER, TOTAL
Anesthesia: General | Site: Shoulder | Laterality: Right

## 2019-06-05 MED ORDER — ONDANSETRON HCL 4 MG PO TABS: 4.0000 mg | ORAL_TABLET | Freq: Four times a day (QID) | ORAL | Status: DC | PRN

## 2019-06-05 MED ORDER — PHENOL 1.4 % MT LIQD: 1.0000 | OROMUCOSAL | Status: DC | PRN

## 2019-06-05 MED ORDER — ROCURONIUM BROMIDE 10 MG/ML (PF) SYRINGE
PREFILLED_SYRINGE | INTRAVENOUS | Status: DC | PRN
  Administered 2019-06-05: 10 mg via INTRAVENOUS
  Administered 2019-06-05: 60 mg via INTRAVENOUS

## 2019-06-05 MED ORDER — METOCLOPRAMIDE HCL 5 MG/ML IJ SOLN: 5.0000 mg | Freq: Three times a day (TID) | INTRAMUSCULAR | Status: DC | PRN

## 2019-06-05 MED ORDER — LEVOTHYROXINE SODIUM 75 MCG PO TABS
75.0000 ug | ORAL_TABLET | Freq: Every day | ORAL | Status: DC
  Administered 2019-06-06: 75 ug via ORAL
  Filled 2019-06-05: qty 1

## 2019-06-05 MED ORDER — METHOCARBAMOL 500 MG IVPB - SIMPLE MED
500.0000 mg | Freq: Four times a day (QID) | INTRAVENOUS | Status: DC | PRN
  Administered 2019-06-05: 500 mg via INTRAVENOUS
  Filled 2019-06-05: qty 50

## 2019-06-05 MED ORDER — MEPERIDINE HCL 50 MG/ML IJ SOLN: 6.2500 mg | INTRAMUSCULAR | Status: DC | PRN

## 2019-06-05 MED ORDER — LACTATED RINGERS IV SOLN: INTRAVENOUS | Status: DC

## 2019-06-05 MED ORDER — OXYCODONE HCL 5 MG PO TABS: 5.0000 mg | ORAL_TABLET | ORAL | Status: DC | PRN

## 2019-06-05 MED ORDER — ONDANSETRON HCL 4 MG/2ML IJ SOLN: 4.0000 mg | Freq: Four times a day (QID) | INTRAMUSCULAR | Status: DC | PRN

## 2019-06-05 MED ORDER — POTASSIUM CHLORIDE ER 10 MEQ PO TBCR
10.0000 meq | EXTENDED_RELEASE_TABLET | Freq: Every day | ORAL | Status: DC
  Administered 2019-06-05 – 2019-06-06 (×2): 10 meq via ORAL
  Filled 2019-06-05 (×4): qty 1

## 2019-06-05 MED ORDER — CEFAZOLIN SODIUM-DEXTROSE 2-4 GM/100ML-% IV SOLN
INTRAVENOUS | Status: AC
  Filled 2019-06-05: qty 100

## 2019-06-05 MED ORDER — FENTANYL CITRATE (PF) 100 MCG/2ML IJ SOLN
50.0000 ug | INTRAMUSCULAR | Status: AC
  Administered 2019-06-05: 50 ug via INTRAVENOUS

## 2019-06-05 MED ORDER — BISACODYL 5 MG PO TBEC: 5.0000 mg | DELAYED_RELEASE_TABLET | Freq: Every day | ORAL | Status: DC | PRN

## 2019-06-05 MED ORDER — HYDROMORPHONE HCL 1 MG/ML IJ SOLN: 0.2500 mg | INTRAMUSCULAR | Status: DC | PRN

## 2019-06-05 MED ORDER — MIDAZOLAM HCL 2 MG/2ML IJ SOLN
INTRAMUSCULAR | Status: AC
  Filled 2019-06-05: qty 2

## 2019-06-05 MED ORDER — PROPOFOL 10 MG/ML IV BOLUS
INTRAVENOUS | Status: DC | PRN
  Administered 2019-06-05: 150 mg via INTRAVENOUS

## 2019-06-05 MED ORDER — ONDANSETRON HCL 4 MG/2ML IJ SOLN
INTRAMUSCULAR | Status: AC
  Filled 2019-06-05: qty 2

## 2019-06-05 MED ORDER — DEXAMETHASONE SODIUM PHOSPHATE 10 MG/ML IJ SOLN
INTRAMUSCULAR | Status: AC
  Filled 2019-06-05: qty 1

## 2019-06-05 MED ORDER — FENTANYL CITRATE (PF) 100 MCG/2ML IJ SOLN
INTRAMUSCULAR | Status: AC
  Filled 2019-06-05: qty 2

## 2019-06-05 MED ORDER — MIDAZOLAM HCL 2 MG/2ML IJ SOLN
INTRAMUSCULAR | Status: DC | PRN
  Administered 2019-06-05 (×2): 1 mg via INTRAVENOUS

## 2019-06-05 MED ORDER — ALBUTEROL SULFATE (2.5 MG/3ML) 0.083% IN NEBU
2.5000 mg | INHALATION_SOLUTION | Freq: Four times a day (QID) | RESPIRATORY_TRACT | Status: DC | PRN
  Administered 2019-06-05: 2.5 mg via RESPIRATORY_TRACT
  Filled 2019-06-05: qty 3

## 2019-06-05 MED ORDER — METOPROLOL TARTRATE 12.5 MG HALF TABLET
12.5000 mg | ORAL_TABLET | Freq: Two times a day (BID) | ORAL | Status: DC
  Administered 2019-06-05 – 2019-06-06 (×2): 12.5 mg via ORAL
  Filled 2019-06-05 (×2): qty 1

## 2019-06-05 MED ORDER — KETOROLAC TROMETHAMINE 15 MG/ML IJ SOLN
15.0000 mg | Freq: Once | INTRAMUSCULAR | Status: AC
  Administered 2019-06-05: 15 mg via INTRAVENOUS

## 2019-06-05 MED ORDER — LIDOCAINE 2% (20 MG/ML) 5 ML SYRINGE
INTRAMUSCULAR | Status: AC
  Filled 2019-06-05: qty 5

## 2019-06-05 MED ORDER — DOCUSATE SODIUM 100 MG PO CAPS
100.0000 mg | ORAL_CAPSULE | Freq: Two times a day (BID) | ORAL | Status: DC
  Administered 2019-06-05 – 2019-06-06 (×2): 100 mg via ORAL
  Filled 2019-06-05 (×2): qty 1

## 2019-06-05 MED ORDER — PANTOPRAZOLE SODIUM 40 MG PO TBEC
40.0000 mg | DELAYED_RELEASE_TABLET | Freq: Every day | ORAL | Status: DC
  Administered 2019-06-05 – 2019-06-06 (×2): 40 mg via ORAL
  Filled 2019-06-05 (×2): qty 1

## 2019-06-05 MED ORDER — MECLIZINE HCL 25 MG PO TABS: 25.0000 mg | ORAL_TABLET | Freq: Three times a day (TID) | ORAL | Status: DC | PRN

## 2019-06-05 MED ORDER — METHOCARBAMOL 500 MG IVPB - SIMPLE MED
INTRAVENOUS | Status: AC
  Filled 2019-06-05: qty 50

## 2019-06-05 MED ORDER — POLYETHYLENE GLYCOL 3350 17 G PO PACK: 17.0000 g | PACK | Freq: Every day | ORAL | Status: DC | PRN

## 2019-06-05 MED ORDER — METHOCARBAMOL 500 MG PO TABS
500.0000 mg | ORAL_TABLET | Freq: Four times a day (QID) | ORAL | Status: DC | PRN
  Administered 2019-06-05 (×2): 500 mg via ORAL
  Filled 2019-06-05 (×2): qty 1

## 2019-06-05 MED ORDER — PHENYLEPHRINE HCL (PRESSORS) 10 MG/ML IV SOLN
INTRAVENOUS | Status: AC
  Filled 2019-06-05: qty 1

## 2019-06-05 MED ORDER — OXYCODONE HCL 5 MG PO TABS: 10.0000 mg | ORAL_TABLET | ORAL | Status: DC | PRN

## 2019-06-05 MED ORDER — BUPIVACAINE-EPINEPHRINE (PF) 0.5% -1:200000 IJ SOLN
INTRAMUSCULAR | Status: DC | PRN
  Administered 2019-06-05 (×5): 3 mL via PERINEURAL

## 2019-06-05 MED ORDER — ALUM & MAG HYDROXIDE-SIMETH 200-200-20 MG/5ML PO SUSP: 30.0000 mL | ORAL | Status: DC | PRN

## 2019-06-05 MED ORDER — BUPIVACAINE LIPOSOME 1.3 % IJ SUSP
INTRAMUSCULAR | Status: DC | PRN
  Administered 2019-06-05 (×5): 2 mL

## 2019-06-05 MED ORDER — SUGAMMADEX SODIUM 200 MG/2ML IV SOLN
INTRAVENOUS | Status: DC | PRN
  Administered 2019-06-05: 170 mg via INTRAVENOUS

## 2019-06-05 MED ORDER — EZETIMIBE 10 MG PO TABS
10.0000 mg | ORAL_TABLET | Freq: Every day | ORAL | Status: DC
  Filled 2019-06-05 (×2): qty 1

## 2019-06-05 MED ORDER — ACETAMINOPHEN 10 MG/ML IV SOLN
1000.0000 mg | Freq: Once | INTRAVENOUS | Status: DC | PRN
  Administered 2019-06-05: 1000 mg via INTRAVENOUS

## 2019-06-05 MED ORDER — ACETAMINOPHEN 325 MG PO TABS: 325.0000 mg | ORAL_TABLET | Freq: Four times a day (QID) | ORAL | Status: DC | PRN

## 2019-06-05 MED ORDER — KETOROLAC TROMETHAMINE 15 MG/ML IJ SOLN
7.5000 mg | Freq: Four times a day (QID) | INTRAMUSCULAR | Status: DC
  Administered 2019-06-05 – 2019-06-06 (×3): 7.5 mg via INTRAVENOUS
  Filled 2019-06-05 (×2): qty 1

## 2019-06-05 MED ORDER — DEXAMETHASONE SODIUM PHOSPHATE 10 MG/ML IJ SOLN
INTRAMUSCULAR | Status: DC | PRN
  Administered 2019-06-05: 8 mg via INTRAVENOUS

## 2019-06-05 MED ORDER — ACETAMINOPHEN 10 MG/ML IV SOLN
INTRAVENOUS | Status: AC
  Filled 2019-06-05: qty 100

## 2019-06-05 MED ORDER — HYDROMORPHONE HCL 1 MG/ML IJ SOLN: 0.5000 mg | INTRAMUSCULAR | Status: DC | PRN

## 2019-06-05 MED ORDER — SODIUM CHLORIDE 0.9 % IR SOLN
Status: DC | PRN
  Administered 2019-06-05: 1000 mL

## 2019-06-05 MED ORDER — GABAPENTIN 300 MG PO CAPS
600.0000 mg | ORAL_CAPSULE | Freq: Two times a day (BID) | ORAL | Status: DC
  Administered 2019-06-05 – 2019-06-06 (×2): 600 mg via ORAL
  Filled 2019-06-05 (×2): qty 2

## 2019-06-05 MED ORDER — TRANEXAMIC ACID-NACL 1000-0.7 MG/100ML-% IV SOLN
1000.0000 mg | INTRAVENOUS | Status: AC
  Administered 2019-06-05: 1000 mg via INTRAVENOUS
  Filled 2019-06-05: qty 100

## 2019-06-05 MED ORDER — MENTHOL 3 MG MT LOZG: 1.0000 | LOZENGE | OROMUCOSAL | Status: DC | PRN

## 2019-06-05 MED ORDER — TRIAMTERENE-HCTZ 37.5-25 MG PO TABS
1.0000 | ORAL_TABLET | Freq: Every day | ORAL | Status: DC
  Administered 2019-06-06: 1 via ORAL
  Filled 2019-06-05: qty 1

## 2019-06-05 MED ORDER — LIDOCAINE 2% (20 MG/ML) 5 ML SYRINGE
INTRAMUSCULAR | Status: DC | PRN
  Administered 2019-06-05: 40 mg via INTRAVENOUS

## 2019-06-05 MED ORDER — PHENYLEPHRINE HCL-NACL 10-0.9 MG/250ML-% IV SOLN
INTRAVENOUS | Status: DC | PRN
Start: 2019-06-05 — End: 2019-06-05
  Administered 2019-06-05: 20 ug/min via INTRAVENOUS

## 2019-06-05 MED ORDER — BENAZEPRIL HCL 20 MG PO TABS
20.0000 mg | ORAL_TABLET | Freq: Two times a day (BID) | ORAL | Status: DC
  Administered 2019-06-05 – 2019-06-06 (×2): 20 mg via ORAL
  Filled 2019-06-05 (×2): qty 1

## 2019-06-05 MED ORDER — PROPOFOL 10 MG/ML IV BOLUS
INTRAVENOUS | Status: AC
  Filled 2019-06-05: qty 40

## 2019-06-05 MED ORDER — NITROGLYCERIN 0.4 MG SL SUBL: 0.4000 mg | SUBLINGUAL_TABLET | SUBLINGUAL | Status: DC | PRN

## 2019-06-05 MED ORDER — ASPIRIN EC 81 MG PO TBEC
81.0000 mg | DELAYED_RELEASE_TABLET | Freq: Every day | ORAL | Status: DC
  Administered 2019-06-05: 81 mg via ORAL
  Filled 2019-06-05: qty 1

## 2019-06-05 MED ORDER — FLEET ENEMA 7-19 GM/118ML RE ENEM: 1.0000 | ENEMA | Freq: Once | RECTAL | Status: DC | PRN

## 2019-06-05 MED ORDER — STERILE WATER FOR IRRIGATION IR SOLN
Status: DC | PRN
  Administered 2019-06-05 (×2): 1000 mL

## 2019-06-05 MED ORDER — KETOROLAC TROMETHAMINE 15 MG/ML IJ SOLN
INTRAMUSCULAR | Status: AC
  Filled 2019-06-05: qty 1

## 2019-06-05 MED ORDER — CEFAZOLIN SODIUM-DEXTROSE 2-4 GM/100ML-% IV SOLN
2.0000 g | INTRAVENOUS | Status: AC
  Administered 2019-06-05: 2 g via INTRAVENOUS

## 2019-06-05 MED ORDER — PROMETHAZINE HCL 25 MG/ML IJ SOLN: 6.2500 mg | INTRAMUSCULAR | Status: DC | PRN

## 2019-06-05 MED ORDER — FENTANYL CITRATE (PF) 250 MCG/5ML IJ SOLN
INTRAMUSCULAR | Status: DC | PRN
  Administered 2019-06-05 (×2): 50 ug via INTRAVENOUS

## 2019-06-05 MED ORDER — ONDANSETRON HCL 4 MG/2ML IJ SOLN
INTRAMUSCULAR | Status: DC | PRN
  Administered 2019-06-05: 4 mg via INTRAVENOUS

## 2019-06-05 MED ORDER — MIDAZOLAM HCL 2 MG/2ML IJ SOLN
1.0000 mg | INTRAMUSCULAR | Status: AC
  Administered 2019-06-05: 1 mg via INTRAVENOUS

## 2019-06-05 MED ORDER — DIPHENHYDRAMINE HCL 12.5 MG/5ML PO ELIX: 12.5000 mg | ORAL_SOLUTION | ORAL | Status: DC | PRN

## 2019-06-05 MED ORDER — ROCURONIUM BROMIDE 10 MG/ML (PF) SYRINGE
PREFILLED_SYRINGE | INTRAVENOUS | Status: AC
  Filled 2019-06-05: qty 10

## 2019-06-05 MED ORDER — METOCLOPRAMIDE HCL 5 MG PO TABS: 5.0000 mg | ORAL_TABLET | Freq: Three times a day (TID) | ORAL | Status: DC | PRN

## 2019-06-05 SURGICAL SUPPLY — 65 items
BAG ZIPLOCK 12X15 (MISCELLANEOUS) ×3 IMPLANT
BIT DRILL 2.0X128 (BIT) ×2 IMPLANT
BIT DRILL 2.0X128MM (BIT) ×1
BLADE SAW SGTL 83.5X18.5 (BLADE) ×3 IMPLANT
BODY TRUNION ECLIPSE 41 SL (Shoulder) ×3 IMPLANT
CEMENT BONE DEPUY (Cement) ×3 IMPLANT
COOLER ICEMAN CLASSIC (MISCELLANEOUS) ×3 IMPLANT
COVER BACK TABLE 60X90IN (DRAPES) ×3 IMPLANT
COVER SURGICAL LIGHT HANDLE (MISCELLANEOUS) ×3 IMPLANT
COVER WAND RF STERILE (DRAPES) IMPLANT
DERMABOND ADVANCED (GAUZE/BANDAGES/DRESSINGS) ×2
DERMABOND ADVANCED .7 DNX12 (GAUZE/BANDAGES/DRESSINGS) ×1 IMPLANT
DRAPE ORTHO SPLIT 77X108 STRL (DRAPES) ×6
DRAPE SHEET LG 3/4 BI-LAMINATE (DRAPES) ×3 IMPLANT
DRAPE SURG 17X11 SM STRL (DRAPES) ×3 IMPLANT
DRAPE SURG ORHT 6 SPLT 77X108 (DRAPES) ×2 IMPLANT
DRAPE U-SHAPE 47X51 STRL (DRAPES) ×3 IMPLANT
DRESSING AQUACEL AG SP 3.5X10 (GAUZE/BANDAGES/DRESSINGS) ×1 IMPLANT
DRSG AQUACEL AG ADV 3.5X10 (GAUZE/BANDAGES/DRESSINGS) ×3 IMPLANT
DRSG AQUACEL AG SP 3.5X10 (GAUZE/BANDAGES/DRESSINGS) ×3
DURAPREP 26ML APPLICATOR (WOUND CARE) ×6 IMPLANT
ELECT BLADE TIP CTD 4 INCH (ELECTRODE) ×3 IMPLANT
ELECT REM PT RETURN 15FT ADLT (MISCELLANEOUS) ×3 IMPLANT
FACESHIELD WRAPAROUND (MASK) ×15 IMPLANT
GLENOID WITH CLEAT MEDIUM (Shoulder) ×3 IMPLANT
GLOVE BIO SURGEON STRL SZ7.5 (GLOVE) ×3 IMPLANT
GLOVE BIO SURGEON STRL SZ8 (GLOVE) ×3 IMPLANT
GLOVE SS BIOGEL STRL SZ 7 (GLOVE) ×1 IMPLANT
GLOVE SS BIOGEL STRL SZ 7.5 (GLOVE) ×1 IMPLANT
GLOVE SUPERSENSE BIOGEL SZ 7 (GLOVE) ×2
GLOVE SUPERSENSE BIOGEL SZ 7.5 (GLOVE) ×2
GOWN STRL REUS W/TWL LRG LVL3 (GOWN DISPOSABLE) ×6 IMPLANT
HEAD HUMERAL ECLIPSE 41/16 (Shoulder) ×3 IMPLANT
IMPL ECLIPSE SPEEDCAP (Shoulder) ×1 IMPLANT
IMPLANT ECLIPSE SPEEDCAP (Shoulder) ×3 IMPLANT
KIT BASIN (CUSTOM PROCEDURE TRAY) ×3 IMPLANT
KIT TURNOVER KIT A (KITS) IMPLANT
MANIFOLD NEPTUNE II (INSTRUMENTS) ×3 IMPLANT
MARKER SKIN DUAL TIP RULER LAB (MISCELLANEOUS) ×3 IMPLANT
NEEDLE TAPERED W/ NITINOL LOOP (MISCELLANEOUS) ×3 IMPLANT
NS IRRIG 1000ML POUR BTL (IV SOLUTION) ×3 IMPLANT
PACK SHOULDER (CUSTOM PROCEDURE TRAY) ×3 IMPLANT
PAD COLD SHLDR WRAP-ON (PAD) ×3 IMPLANT
PROTECTOR NERVE ULNAR (MISCELLANEOUS) ×3 IMPLANT
RESTRAINT HEAD UNIVERSAL NS (MISCELLANEOUS) ×3 IMPLANT
SCREW SM FOR ECLIPSE CAGE 30 (Screw) ×3 IMPLANT
SIZER ECLIPSE CAGE SCREW (ORTHOPEDIC DISPOSABLE SUPPLIES) ×3 IMPLANT
SLING ARM FOAM STRAP LRG (SOFTGOODS) ×3 IMPLANT
SLING ARM FOAM STRAP MED (SOFTGOODS) IMPLANT
SMARTMIX MINI TOWER (MISCELLANEOUS)
SPONGE LAP 18X18 RF (DISPOSABLE) IMPLANT
SUCTION FRAZIER HANDLE 12FR (TUBING) ×3
SUCTION TUBE FRAZIER 12FR DISP (TUBING) ×1 IMPLANT
SUT FIBERWIRE #2 38 T-5 BLUE (SUTURE)
SUT MNCRL AB 3-0 PS2 18 (SUTURE) ×3 IMPLANT
SUT MON AB 2-0 CT1 36 (SUTURE) ×3 IMPLANT
SUT VIC AB 1 CT1 36 (SUTURE) ×9 IMPLANT
SUTURE FIBERWR #2 38 T-5 BLUE (SUTURE) IMPLANT
SUTURE TAPE 1.3 40 TPR END (SUTURE) ×4 IMPLANT
SUTURETAPE 1.3 40 TPR END (SUTURE) ×12
TOWEL OR 17X26 10 PK STRL BLUE (TOWEL DISPOSABLE) ×3 IMPLANT
TOWEL OR NON WOVEN STRL DISP B (DISPOSABLE) ×3 IMPLANT
TOWER SMARTMIX MINI (MISCELLANEOUS) IMPLANT
WATER STERILE IRR 1000ML POUR (IV SOLUTION) ×6 IMPLANT
YANKAUER SUCT BULB TIP 10FT TU (MISCELLANEOUS) ×3 IMPLANT

## 2019-06-05 NOTE — Anesthesia Postprocedure Evaluation (Signed)
Anesthesia Post Note  Patient: Gabrielle [name]  Procedure(s) Performed: TOTAL SHOULDER ARTHROPLASTY (Right Shoulder)     Patient location during evaluation: PACU Anesthesia Type: General Level of consciousness: awake and sedated Pain management: pain level controlled Vital Signs Assessment: post-procedure vital signs reviewed and stable Respiratory status: spontaneous breathing Cardiovascular status: stable Postop Assessment: no apparent nausea or vomiting Anesthetic complications: no    Last Vitals:  Vitals:   06/05/19 1115 06/05/19 1142  BP: 117/67 112/61  Pulse: 65 67  Resp: 16 14  Temp: 36.4 C 36.6 C  SpO2: 97% 98%    Last Pain:  Vitals:   06/05/19 1115  TempSrc:   PainSc: Asleep   Pain Goal: Patients Stated Pain Goal: 5 (06/05/19 1030)                 Huston Foley

## 2019-06-05 NOTE — Anesthesia Procedure Notes (Signed)
Anesthesia Regional Block: Interscalene brachial plexus block   Pre-Anesthetic Checklist: ,, timeout performed, Correct Patient, Correct Site, Correct Laterality, Correct Procedure, Correct Position, site marked, Risks and benefits discussed,  Surgical consent,  Pre-op evaluation,  At surgeon's request and post-op pain management  Laterality: Right and Upper  Prep: chloraprep       Needles:  Injection technique: Single-shot  Needle Type: Echogenic Stimulator Needle     Needle Length: 10cm  Needle Gauge: 21   Needle insertion depth: 1.5 cm   Additional Needles:   Procedures:,,,, ultrasound used (permanent image in chart),,,,  Narrative:  Start time: 06/05/2019 7:05 AM End time: 06/05/2019 7:15 AM Injection made incrementally with aspirations every 5 mL.  Performed by: Personally  Anesthesiologist: Lyn Hollingshead, MD

## 2019-06-05 NOTE — Op Note (Signed)
06/05/2019  9:32 AM  PATIENT:   Gabrielle [name]  74 y.o. female  PRE-OPERATIVE DIAGNOSIS:  Right shoulder osteoarthritis  POST-OPERATIVE DIAGNOSIS: Same  PROCEDURE: Right shoulder anatomic arthroplasty utilizing an Eclipse stemless humeral implant with a 41 x 16 concentric head, medium glenoid.  SURGEON:  Marin Shutter M.D.  ASSISTANTS: Jenetta Loges, PA-C  ANESTHESIA:   General endotracheal and interscalene block with Exparel  EBL: 150 cc  SPECIMEN: None  Drains: None   PATIENT DISPOSITION:  PACU - hemodynamically stable.    PLAN OF CARE: Admit for overnight observation  Brief history:  Gabrielle [name] is a 74 year old female who has had chronic and progressively increasing right shoulder pain related to advanced osteoarthritis with symptoms that have been refractory to prolonged attempts at conservative management.  Her plain radiographs confirm advanced arthritis with complete loss of joint space, peripheral osteophyte formation, subchondral sclerosis, and clinical presentation demonstrating significantly restricted mobility.  She is brought to the operating this time for planned right anatomic shoulder arthroplasty.  Preoperatively I counseled Gabrielle [name] regarding treatment options as well as the potential risks versus benefits thereof.  Possible surgical complications were reviewed including bleeding, infection, neurovascular injury, persistent pain, loss of motion, failure the implant, and possible need for additional surgery.  She understands, and accepts, and agrees to the plan procedure.  Procedure in detail:  After undergoing routine preop evaluation patient received prophylactic antibiotics and interscalene block with Exparel was established in the holding area by the anesthesia department.  Subsequently placed supine on the operating table and underwent the smooth induction of a general endotracheal anesthesia.  Placed into the beachchair position and appropriately  padded and protected.  The right shoulder girdle region was sterilely prepped and draped in standard fashion.  Timeout was called.  An anterior deltopectoral approach was made through an 8 cm incision.  Skin flaps elevated dissection carried deeply deltopectoral interval was then developed proximal distal with the vein taken laterally.  Conjoined tendon mobilized retracted medially.  Adhesions divided beneath the deltoid.  The upper centimeter half of the pectoralis major was then tenotomized for improved exposure.  The bicipital groove was unroofed and there was evidence for prior rupture of the long head biceps tendon.  A residual portion distally was tenodesed.  We then divided the rotator cuff along the rotator interval from the bicipital groove towards the base of the coracoid and then once the insertion of the subscapularis was identified it became clear that there had been some significant attenuation and partial tearing of the subscapularis.  I did perform a lesser tuberosity osteotomy with an oscillating saw but this was a very thin fragment and the majority of the tendon had separated and so the subscapularis was tagged mobilized and reflected medially and anticipated a direct tendon repair upon closure.  At this point the capsular attachments were then divided from the anterior and infra margins of the humeral neck and the humeral head was then delivered through the wound.  We outlined our proposed humeral head resection using the extra medullary guide and outlined the alignment of the native anatomy and an oscillating saw was then used to resect the humeral head taking care to protect the rotator cuff insertion superiorly and posteriorly.  Rondure was then used to remove osteophytes on the margins of the humeral neck.  We then sized the humeral head trunnion at a size 41 and the cut surface of the proximal humerus was then prepared centering the humeral head congruently over  the cut surface.  A metal cap  was then placed over the proximal humeral surface and then exposed the glenoid with appropriate retractors.  Severe synovitis in synovial overgrowth was noted.  A circumferential labral resection was then completed gaining visualization the entire glenoid.  Subscapularis was mobilized.  A guidepin was then directed into the center of the glenoid after we determined the medium glenoid gave [place] the best fit.  The glenoid was then prepared with our standard reamer and the central drill hole placed followed by the superior and inferior peg and slot respectively and the glenoid was then broached and the trial showed excellent fit.  At this point the glenoid was meticulously cleaned and dried.  Cement was mixed and introduced into the superior and inferior peg and slot respectively and the glenoid was then then packed it after we impacted morselized bone graft around the central peg glenoid was then placed with excellent fit and fixation.  We then returned our attention back to the proximal humerus where our trunnion was positioned and impacted and then our central screw was placed after it was filled with bone graft and it was then inserted with excellent purchase and fixation.  We then performed a series of trial reductions and felt that the 41 x 16 head gave [place] appropriate soft tissue balance with approximate 50% translation posteriorly.  Good tissue elasticity.  Trial was then removed.  We then placed our 3 medial row repair sutures for the subscapularis repair using the extra medullary guide.  Once these were seated we then cleaned the [facility] taper of our trunnion and the final 41 x 16 head was then impacted and final reduction was then performed.  We then confirmed that the subscapularis was properly mobilized and a stay suture was placed at the superior border of the subscapularis using a suture tape suture and then the 3 medial row sutures were passed up through the subscapularis tendon and then these were shuttled  in an alternating fashion into our 2 lateral anchors which were placed into the bicipital groove using a double row repair technique and this allowed excellent reapposition of the subscap to the lesser tuberosity with tension achieved and good elasticity and the arm easily achieved 45 degrees of external rotation without excessive tension on the subscap repair.  Suture limbs were then trimmed.  The rotator interval was repaired with a series of suture tape figure-of-eight sutures.  Final irrigation was then completed.  Closure was completed with #1 Vicryl figure-of-eight sutures along the deltopectoral interval 2-0 Monocryl for the subcu layer and intracuticular 3-0 Monocryl for the skin followed by Dermabond and Aquacel dressing.  Right arm is placed into a sling the patient was awakened, extubated, and taken to the recovery room in stable condition.  Jenetta Loges, PA-C was used as an Environmental consultant throughout this case essential for help with positioning the patient, position extremity, tissue manipulation, implantation of the prosthesis, wound closure, and intraoperative decision-making.  Marin Shutter MD   Contact # [phone]

## 2019-06-05 NOTE — H&P (Signed)
Gabrielle [name]    Chief Complaint: Right shoulder osteoarthritis HPI: The patient is a 74 y.o. female with chronic and progressively increasing right shoulder pain related to advanced osteoarthritis of the glenohumeral joint.  Past Medical History:  Diagnosis Date  . Anemia   . Arthritis 01-17-11   degenerative disc disease-all joints  . Bursitis   . Cholecystitis with cholelithiasis 03/22/2018  . Cholesterol serum elevated 01-17-11   tx. meds  . GERD (gastroesophageal reflux disease) 01-17-11   tx. omeprazole  . Heart murmur    slight murmur  . High cholesterol   . Hypertension 01-17-11   tx. meds  . Hypothyroidism   . Leg pain    ABIs 2/18: normal bilaterally  . Neuropathy    FEET  . Osteoarthritis   . Peripheral neuropathy   . Pre-diabetes   . Pulmonary nodule   . Seasonal allergies   . Sinus problem 01-17-11   tx. Claritin D  . ST elevation (STEMI) myocardial infarction involving left circumflex coronary artery ([place]) 08/21/2015    Past Surgical History:  Procedure Laterality Date  . ABDOMINAL HYSTERECTOMY  01-17-11   '93-Hysterectomy-heavy bleeding  . ANTERIOR LATERAL LUMBAR FUSION WITH PERCUTANEOUS SCREW 1 LEVEL N/A 03/07/2017   Procedure: XLIF L3-4;  Surgeon: Melina Schools, MD;  Location: [place];  Service: Orthopedics;  Laterality: N/A;  4 hrs  . APPENDECTOMY    . BACK SURGERY  01-17-11   04-20-10-T-Lift lumbar fusion  . [facility] PROCEDURE  01-17-11   Bladder sling  . CARDIAC CATHETERIZATION N/A 08/21/2015   Procedure: Left Heart Cath and Coronary Angiography;  Surgeon: Sherren Mocha, MD;  Location: [place] [facility];  Service: Cardiovascular;  Laterality: N/A;  . CARDIAC CATHETERIZATION N/A 08/21/2015   Procedure: Coronary Stent Intervention;  Surgeon: Sherren Mocha, MD;  Location: [place] [facility];  Service: Cardiovascular;  Laterality: N/A;  . CARDIAC CATHETERIZATION N/A 08/23/2015   Procedure: Coronary Stent Intervention;  Surgeon: Leonie Man, MD;   Location: [place] [facility];  Service: Cardiovascular;  Laterality: N/A;  promus 2.5x16 in RCA  . CARDIAC CATHETERIZATION N/A 08/23/2015   Procedure: Left Heart Cath and Coronary Angiography;  Surgeon: Leonie Man, MD;  Location: [place] [facility];  Service: Cardiovascular;  Laterality: N/A;  . CARPAL TUNNEL RELEASE  01-17-11   '02-left, also right  . CHOLECYSTECTOMY N/A 03/22/2018   Procedure: LAPAROSCOPIC CHOLECYSTECTOMY;  Surgeon: Fanny Skates, MD;  Location: [place];  Service: General;  Laterality: N/A;  . COLONOSCOPY    . HARDWARE REMOVAL Right 05/15/2013   Procedure: REMOVAL RIGHT L4-L5 PEDICLE SCREWS AND ROD ;  Surgeon: Melina Schools, MD;  Location: [place];  Service: Orthopedics;  Laterality: Right;  . JOINT REPLACEMENT  01-17-11    RTKA' 02  . LAPAROSCOPIC APPENDECTOMY N/A 08/13/2013   Procedure: APPENDECTOMY LAPAROSCOPIC;  Surgeon: Pedro Earls, MD;  Location: [facility];  Service: General;  Laterality: N/A;  . TONSILLECTOMY  01-17-11   child  . TOTAL KNEE ARTHROPLASTY  01/19/2011   Procedure: TOTAL KNEE ARTHROPLASTY;  Surgeon: Johnn Hai;  Location: [facility];  Service: Orthopedics;  Laterality: Left;    Family History  Problem Relation Age of Onset  . Aneurysm Mother   . Heart disease Father   . Heart disease Brother   . Hypertension Brother   . Diabetes Brother   . Hyperlipidemia Brother   . Diabetes Sister   . Hypertension Sister   . Hyperlipidemia Sister   . Neuropathy Neg  Hx   . Urticaria Neg Hx   . Allergic rhinitis Neg Hx   . Asthma Neg Hx     Social History:  reports that she quit smoking about 25 years ago. Her smoking use included cigarettes. She started smoking about 56 years ago. She has a 30.00 pack-year smoking history. She has never used smokeless tobacco. She reports current alcohol use of about 2.0 standard drinks of alcohol per week. She reports that she does not use drugs.   Medications Prior to Admission  Medication Sig Dispense Refill  .  acetaminophen (TYLENOL) 650 MG CR tablet Take 1,950 mg by mouth daily.    Marland Kitchen aspirin EC 81 MG tablet Take 81 mg by mouth at bedtime.     . benazepril (LOTENSIN) 20 MG tablet Take 1 tablet (20 mg total) by mouth 2 (two) times daily. 180 tablet 3  . Calcium Carbonate-Vitamin D (CALCIUM 600 + D PO) Take 1 tablet by mouth 2 (two) times daily.      . Cholecalciferol (VITAMIN D) 2000 units CAPS Take 2,000 Units by mouth daily.    Marland Kitchen docusate sodium (COLACE) 100 MG capsule Take 100 mg by mouth daily.     Marland Kitchen ezetimibe (ZETIA) 10 MG tablet Take 1 tablet (10 mg total) by mouth daily. 90 tablet 2  . fish oil-omega-3 fatty acids 1000 MG capsule Take 1 g by mouth every evening.     . gabapentin (NEURONTIN) 600 MG tablet Take 600 mg by mouth 2 (two) times daily.    . Glucosamine-Chondroit-Vit C-Mn (GLUCOSAMINE CHONDR 1500 COMPLX PO) Take 2 tablets by mouth daily. 1500/1200    . levothyroxine (SYNTHROID, LEVOTHROID) 75 MCG tablet Take 75 mcg by mouth daily before breakfast.     . Liniments (SALONPAS PAIN RELIEF PATCH EX) Place 1 patch onto the skin daily as needed (pain).    Marland Kitchen loratadine-pseudoephedrine (CLARITIN-D 24-HOUR) 10-240 MG per 24 hr tablet Take 1 tablet by mouth every evening.     . meclizine (ANTIVERT) 25 MG tablet Take 25 mg by mouth 3 (three) times daily as needed for dizziness.    . Melatonin 10 MG TABS Take 10 mg by mouth at bedtime.     . metoprolol tartrate (LOPRESSOR) 25 MG tablet Take 0.5 tablets (12.5 mg total) by mouth 2 (two) times daily. 90 tablet 3  . omeprazole (PRILOSEC) 20 MG capsule Take 20 mg by mouth daily with breakfast.     . polyethylene glycol (MIRALAX / GLYCOLAX) packet Take 17 g by mouth daily as needed for mild constipation.     . potassium chloride (KLOR-CON) 10 MEQ tablet Take 1 tablet (10 mEq total) by mouth daily. 90 tablet 3  . rosuvastatin (CRESTOR) 40 MG tablet Take 1 tablet (40 mg total) by mouth at bedtime. 90 tablet 3  . triamterene-hydrochlorothiazide (MAXZIDE-25)  37.5-25 MG tablet Take 1 tablet by mouth daily.  1  . nitroGLYCERIN (NITROSTAT) 0.4 MG SL tablet Place 0.4 mg under the tongue every 5 (five) minutes as needed for chest pain.    . NON FORMULARY Place 1 drop under the tongue daily. CBD Oil       Physical Exam: Right shoulder demonstrates painful and guarded motion as noted at recent office visits.  She maintains good strength of the rotator cuff musculature.  Plain radiographs confirm advanced arthritis with complete loss of joint space, peripheral osteophyte formation, and subchondral sclerosis.  Vitals  Temp:  [98 F (36.7 C)] 98 F (36.7 C) (05/06 0546) Pulse  Rate:  [86] 86 (05/06 0546) Resp:  [21] 21 (05/06 0546) BP: (145)/(93) 145/93 (05/06 0546) SpO2:  [100 %] 100 % (05/06 0546) Weight:  [82.7 kg] 82.7 kg (05/06 0549)  Assessment/Plan  Impression: Right shoulder osteoarthritis  Plan of Action: Procedure(s): TOTAL SHOULDER ARTHROPLASTY  Devoiry Corriher M Adaliah Hiegel 06/05/2019, 6:19 AM Contact # [phone]

## 2019-06-05 NOTE — Transfer of Care (Signed)
Immediate Anesthesia Transfer of Care Note  Patient: Gabrielle [name]  Procedure(s) Performed: TOTAL SHOULDER ARTHROPLASTY (Right Shoulder)  Patient Location: PACU  Anesthesia Type:General  Level of Consciousness: awake, drowsy and patient cooperative  Airway & Oxygen Therapy: Patient Spontanous Breathing and Patient connected to face mask oxygen  Post-op Assessment: Report given to RN and Post -op Vital signs reviewed and stable  Post vital signs: Reviewed and stable  Last Vitals:  Vitals Value Taken Time  BP 132/62 06/05/19 0939  Temp 36.5 C 06/05/19 0939  Pulse 84 06/05/19 0941  Resp 28 06/05/19 0941  SpO2 100 % 06/05/19 0941  Vitals shown include unvalidated device data.  Last Pain:  Vitals:   06/05/19 0939  TempSrc:   PainSc: (P) Asleep      Patients Stated Pain Goal: 4 ([phone] [phone])  Complications: No apparent anesthesia complications

## 2019-06-05 NOTE — Discharge Instructions (Signed)
 Kevin M. Supple, M.D., F.A.A.O.S. Orthopaedic Surgery Specializing in Arthroscopic and Reconstructive Surgery of the Shoulder [phone] [address] - [place], [place] [address] - Fax [phone]   POST-OP TOTAL SHOULDER REPLACEMENT INSTRUCTIONS  1. Follow up in the office for your first post-op appointment 10-14 days from the date of your surgery. If you do not already have a scheduled appointment, our office will contact you to schedule.  2. The bandage over your incision is waterproof. You may begin showering with this dressing on. You may leave this dressing on until first follow up appointment within 2 weeks. We prefer you leave this dressing in place until follow up however after 5-7 days if you are having itching or skin irritation and would like to remove it you may do so. Go slow and tug at the borders gently to break the bond the dressing has with the skin. At this point if there is no drainage it is okay to go without a bandage or you may cover it with a light guaze and tape. You can also expect significant bruising around your shoulder that will drift down your arm and into your chest wall. This is very normal and should resolve over several days.   3. Wear your sling/immobilizer at all times except to perform the exercises below or to occasionally let your arm dangle by your side to stretch your elbow. You also need to sleep in your sling immobilizer until instructed otherwise. It is ok to remove your sling if you are sitting in a controlled environment and allow your arm to rest in a position of comfort by your side or on your lap with pillows to give your neck and skin a break from the sling. You may remove it to allow arm to dangle by side to shower. If you are up walking around and when you go to sleep at night you need to wear it.  4. Range of motion to your elbow, wrist, and hand are encouraged 3-5 times daily. Exercise to your hand and fingers helps to reduce  swelling you may experience.   5. Prescriptions for a pain medication and a muscle relaxant are provided for you. It is recommended that if you are experiencing pain that you pain medication alone is not controlling, add the muscle relaxant along with the pain medication which can give additional pain relief. The first 1-2 days is generally the most severe of your pain and then should gradually decrease. As your pain lessens it is recommended that you decrease your use of the pain medications to an "as needed basis'" only and to always comply with the recommended dosages of the pain medications.  6. Pain medications can produce constipation along with their use. If you experience this, the use of an over the counter stool softener or laxative daily is recommended.   7. For additional questions or concerns, please do not hesitate to call the office. If after hours there is an answering service to forward your concerns to the physician on call.  8.Pain control following an exparel block  To help control your post-operative pain you received a nerve block  performed with Exparel which is a long acting anesthetic (numbing agent) which can provide pain relief and sensations of numbness (and relief of pain) in the operative shoulder and arm for up to 3 days. Sometimes it provides mixed relief, meaning you may still have numbness in certain areas of the arm but can still be able to   move  parts of that arm, hand, and fingers. We recommend that your prescribed pain medications  be used as needed. We do not feel it is necessary to "pre medicate" and "stay ahead" of pain.  Taking narcotic pain medications when you are not having any pain can lead to unnecessary and potentially dangerous side effects.    9. Use the ice machine as much as possible in the first 5-7 days from surgery, then you can wean its use to as needed. The ice typically needs to be replaced every 6 hours, instead of ice you can actually freeze  water bottles to put in the cooler and then fill water around them to avoid having to purchase ice. You can have spare water bottles freezing to allow you to rotate them once they have melted. Try to have a thin shirt or light cloth or towel under the ice wrap to protect your skin.   10.  We recommend that you avoid any dental work or cleaning in the first 3 months following your joint replacement. This is to help minimize the possibility of infection from the bacteria in your mouth that enters your bloodstream during dental work. We also recommend that you take an antibiotic prior to your dental work for the first year after your shoulder replacement to further help reduce that risk. Please simply contact our office for antibiotics to be sent to your pharmacy prior to dental work.  POST-OP EXERCISES  Pendulum Exercises  Perform pendulum exercises while standing and bending at the waist. Support your uninvolved arm on a table or chair and allow your operated arm to hang freely. Make sure to do these exercises passively - not using you shoulder muscles. These exercises can be performed once your nerve block effects have worn off.  Repeat 20 times. Do 3 sessions per day.

## 2019-06-05 NOTE — Anesthesia Procedure Notes (Signed)
Procedure Name: Intubation Date/Time: 06/05/2019 7:32 AM Performed by: Eben Burow, CRNA Pre-anesthesia Checklist: Patient identified, Emergency Drugs available, Suction available, Patient being monitored and Timeout performed Patient Re-evaluated:Patient Re-evaluated prior to induction Oxygen Delivery Method: Circle system utilized Preoxygenation: Pre-oxygenation with 100% oxygen Induction Type: IV induction Ventilation: Mask ventilation without difficulty Laryngoscope Size: Mac and 3 Grade View: Grade I Tube type: Oral Tube size: 7.0 mm Number of attempts: 1 Airway Equipment and Method: Stylet Placement Confirmation: ETT inserted through vocal cords under direct vision,  positive ETCO2 and breath sounds checked- equal and bilateral Secured at: 21 cm Tube secured with: Tape Dental Injury: Teeth and Oropharynx as per pre-operative assessment

## 2019-06-06 ENCOUNTER — Encounter: Payer: Self-pay | Admitting: *Deleted

## 2019-06-06 DIAGNOSIS — M19011 Primary osteoarthritis, right shoulder: Secondary | ICD-10-CM | POA: Diagnosis not present

## 2019-06-06 MED ORDER — ONDANSETRON HCL 4 MG PO TABS: 4.0000 mg | ORAL_TABLET | Freq: Three times a day (TID) | ORAL | 0 refills | Status: DC | PRN

## 2019-06-06 MED ORDER — CYCLOBENZAPRINE HCL 10 MG PO TABS
10.0000 mg | ORAL_TABLET | Freq: Three times a day (TID) | ORAL | 1 refills | Status: DC | PRN
Start: 2019-06-06 — End: 2019-06-24

## 2019-06-06 MED ORDER — OXYCODONE-ACETAMINOPHEN 5-325 MG PO TABS: 1.0000 | ORAL_TABLET | ORAL | 0 refills | Status: DC | PRN

## 2019-06-06 NOTE — Discharge Summary (Signed)
PATIENT ID:      Gabrielle [name]  MRN:     [id number] DOB/AGE:    [birth year] / 74 y.o.     DISCHARGE SUMMARY  ADMISSION DATE:    06/05/2019 DISCHARGE DATE:    ADMISSION DIAGNOSIS: Right shoulder osteoarthritis Past Medical History:  Diagnosis Date  . Anemia   . Arthritis 01-17-11   degenerative disc disease-all joints  . Bursitis   . Cholecystitis with cholelithiasis 03/22/2018  . Cholesterol serum elevated 01-17-11   tx. meds  . GERD (gastroesophageal reflux disease) 01-17-11   tx. omeprazole  . Heart murmur    slight murmur  . High cholesterol   . Hypertension 01-17-11   tx. meds  . Hypothyroidism   . Leg pain    ABIs 2/18: normal bilaterally  . Neuropathy    FEET  . Osteoarthritis   . Peripheral neuropathy   . Pre-diabetes   . Pulmonary nodule   . Seasonal allergies   . Sinus problem 01-17-11   tx. Claritin D  . ST elevation (STEMI) myocardial infarction involving left circumflex coronary artery ([place]) 08/21/2015    DISCHARGE DIAGNOSIS:   Active Problems:   Status post total shoulder arthroplasty, right   PROCEDURE: Procedure(s): TOTAL SHOULDER ARTHROPLASTY on 06/05/2019  CONSULTS:    HISTORY:  See H&P in chart.  HOSPITAL COURSE:  Gabrielle [name] is a 74 y.o. admitted on 06/05/2019 with a diagnosis of Right shoulder osteoarthritis.  They were brought to the operating room on 06/05/2019 and underwent Procedure(s): TOTAL SHOULDER ARTHROPLASTY.    They were given perioperative antibiotics:  Anti-infectives (From admission, onward)   Start     Dose/Rate Route Frequency Ordered Stop   06/05/19 0600  ceFAZolin (ANCEF) IVPB 2g/100 mL premix     2 g 200 mL/hr over 30 Minutes Intravenous On call to O.R. 06/05/19 [id number] 06/05/19 0733   06/05/19 0542  ceFAZolin (ANCEF) 2-4 GM/100ML-% IVPB    Note to Pharmacy: Randa Evens  : cabinet override      06/05/19 0542 06/05/19 0746    .  Patient underwent the above named procedure and tolerated it well. The following day they  were hemodynamically stable and pain was controlled on oral analgesics. They were neurovascularly intact to the operative extremity. OT was ordered and worked with patient per protocol. They were medically and orthopaedically stable for discharge on .    DIAGNOSTIC STUDIES:  RECENT RADIOGRAPHIC STUDIES :  No results found.  RECENT VITAL SIGNS:   Patient Vitals for the past 24 hrs:  BP Temp Temp src Pulse Resp SpO2  06/06/19 0613 (!) 141/97 98 F (36.7 C) Oral 76 16 99 %  06/06/19 0236 (!) 146/66 97.7 F (36.5 C) Oral 87 18 96 %  06/05/19 2101 (!) 152/71 98.2 F (36.8 C) Oral 86 18 96 %  06/05/19 1445 112/62 -- -- 71 16 100 %  06/05/19 1345 115/63 -- -- 68 16 100 %  06/05/19 1244 117/65 -- -- 65 16 100 %  06/05/19 1142 112/61 97.9 F (36.6 C) -- 67 14 98 %  06/05/19 1115 117/67 97.6 F (36.4 C) -- 65 16 97 %  06/05/19 1100 107/61 -- -- 72 17 97 %  06/05/19 1045 119/65 -- -- 69 12 98 %  06/05/19 1040 -- -- -- 69 13 97 %  06/05/19 1035 -- -- -- 70 14 98 %  06/05/19 1030 (!) 111/94 -- -- 71 (!) 22 100 %  06/05/19 1015 117/62 -- -- 75 --  100 %  06/05/19 1000 132/69 -- -- 76 20 97 %  06/05/19 0945 126/63 -- -- 83 (!) 23 100 %  06/05/19 0939 132/62 97.7 F (36.5 C) -- 85 (!) 26 100 %  .  RECENT EKG RESULTS:    Orders placed or performed in visit on 05/27/19  . EKG 12-Lead    DISCHARGE INSTRUCTIONS:    DISCHARGE MEDICATIONS:   Allergies as of 06/06/2019      Reactions   Penicillins Other (See Comments)   Allergy as an infant - no other information available Has patient had a PCN reaction causing immediate rash, facial/tongue/throat swelling, SOB or lightheadedness with hypotension: Unknown Has patient had a PCN reaction causing severe rash involving mucus membranes or skin necrosis: Unknown Has patient had a PCN reaction that required hospitalization: No Has patient had a PCN reaction occurring within the last 10 years: No If all of the above answers are "NO", then may  proceed with Cephalospor   Pravastatin Other (See Comments)   Effects liver function      Medication List    TAKE these medications   acetaminophen 650 MG CR tablet Commonly known as: TYLENOL Take 1,950 mg by mouth daily.   aspirin EC 81 MG tablet Take 81 mg by mouth at bedtime.   benazepril 20 MG tablet Commonly known as: LOTENSIN Take 1 tablet (20 mg total) by mouth 2 (two) times daily.   CALCIUM 600 + D PO Take 1 tablet by mouth 2 (two) times daily.   cyclobenzaprine 10 MG tablet Commonly known as: FLEXERIL Take 1 tablet (10 mg total) by mouth 3 (three) times daily as needed for muscle spasms.   docusate sodium 100 MG capsule Commonly known as: COLACE Take 100 mg by mouth daily.   ezetimibe 10 MG tablet Commonly known as: ZETIA Take 1 tablet (10 mg total) by mouth daily.   fish oil-omega-3 fatty acids 1000 MG capsule Take 1 g by mouth every evening.   gabapentin 600 MG tablet Commonly known as: NEURONTIN Take 600 mg by mouth 2 (two) times daily.   GLUCOSAMINE CHONDR 1500 COMPLX PO Take 2 tablets by mouth daily. 1500/1200   levothyroxine 75 MCG tablet Commonly known as: SYNTHROID Take 75 mcg by mouth daily before breakfast.   loratadine-pseudoephedrine 10-240 MG 24 hr tablet Commonly known as: CLARITIN-D 24-hour Take 1 tablet by mouth every evening.   meclizine 25 MG tablet Commonly known as: ANTIVERT Take 25 mg by mouth 3 (three) times daily as needed for dizziness.   Melatonin 10 MG Tabs Take 10 mg by mouth at bedtime.   metoprolol tartrate 25 MG tablet Commonly known as: LOPRESSOR Take 0.5 tablets (12.5 mg total) by mouth 2 (two) times daily.   nitroGLYCERIN 0.4 MG SL tablet Commonly known as: NITROSTAT Place 0.4 mg under the tongue every 5 (five) minutes as needed for chest pain.   NON FORMULARY Place 1 drop under the tongue daily. CBD Oil   omeprazole 20 MG capsule Commonly known as: PRILOSEC Take 20 mg by mouth daily with breakfast.    ondansetron 4 MG tablet Commonly known as: Zofran Take 1 tablet (4 mg total) by mouth every 8 (eight) hours as needed for nausea or vomiting.   oxyCODONE-acetaminophen 5-325 MG tablet Commonly known as: Percocet Take 1 tablet by mouth every 4 (four) hours as needed (max 6 q).   polyethylene glycol 17 g packet Commonly known as: MIRALAX / GLYCOLAX Take 17 g by mouth daily as needed  for mild constipation.   potassium chloride 10 MEQ tablet Commonly known as: KLOR-CON Take 1 tablet (10 mEq total) by mouth daily.   rosuvastatin 40 MG tablet Commonly known as: CRESTOR Take 1 tablet (40 mg total) by mouth at bedtime.   SALONPAS PAIN RELIEF PATCH EX Place 1 patch onto the skin daily as needed (pain).   triamterene-hydrochlorothiazide 37.5-25 MG tablet Commonly known as: MAXZIDE-25 Take 1 tablet by mouth daily.   Vitamin D 50 MCG (2000 UT) Caps Take 2,000 Units by mouth daily.       FOLLOW UP VISIT:   Follow-up Information    Justice Britain, MD.   Specialty: Orthopedic Surgery Why: in 10-14 days Contact information: [address] [place] [address] [phone]           DISCHARGE TO: Home   DISCHARGE CONDITION:  Thereasa Parkin Edgel Degnan for Dr. Justice Britain 06/06/2019, 8:02 AM

## 2019-06-06 NOTE — Evaluation (Signed)
Occupational Therapy Evaluation Patient Details Name: Gabrielle [name] MRN: [id number] DOB: [birth year] Today's Date: 06/06/2019    History of Present Illness Patient is a 74 year old female admitted for R total shoulder arthroplasty   Clinical Impression   Patient education in shoulder protocol, prescribed exercises and compensatory strategies for self care. Patient verbalize/demo understanding. All questions/concerns addressed.     Follow Up Recommendations  Follow surgeon's recommendation for DC plan and follow-up therapies    Equipment Recommendations  None recommended by OT       Precautions / Restrictions Precautions Precautions: Shoulder Type of Shoulder Precautions: PROM for ADL ER 20, ABD 45, FF 60; gentle pendulums, can shower, AROM elbow wrist and hand. Shoulder Interventions: Shoulder sling/immobilizer;Off for dressing/bathing/exercises Precaution Booklet Issued: Yes (comment) Required Braces or Orthoses: Sling Restrictions Weight Bearing Restrictions: Yes RUE Weight Bearing: Non weight bearing      Mobility Bed Mobility Overal bed mobility: Modified Independent             General bed mobility comments: increased time and HOB elevated  Transfers Overall transfer level: Needs assistance Equipment used: None Transfers: Sit to/from Stand Sit to Stand: Supervision              Balance Overall balance assessment: Mild deficits observed, not formally tested                                         ADL either performed or assessed with clinical judgement   ADL Overall ADL's : Needs assistance/impaired Eating/Feeding: Set up;Sitting   Grooming: Set up;Sitting   Upper Body Bathing: Minimal assistance;Sitting   Lower Body Bathing: Supervison/ safety;Sitting/lateral leans;Sit to/from stand   Upper Body Dressing : Minimal assistance;Sitting;Cueing for compensatory techniques   Lower Body Dressing: Minimal assistance;Sit to/from  stand;Sitting/lateral leans   Toilet Transfer: Supervision/safety;Ambulation   Toileting- Clothing Manipulation and Hygiene: Minimal assistance;Sitting/lateral lean;Sit to/from stand       Functional mobility during ADLs: Supervision/safety                    Pertinent Vitals/Pain Pain Assessment: No/denies pain     Hand Dominance Right   Extremity/Trunk Assessment Upper Extremity Assessment Upper Extremity Assessment: RUE deficits/detail RUE Deficits / Details: + nerve block minimal digit AROM   Lower Extremity Assessment Lower Extremity Assessment: Overall WFL for tasks assessed   Cervical / Trunk Assessment Cervical / Trunk Assessment: Normal   Communication Communication Communication: No difficulties   Cognition Arousal/Alertness: Awake/alert Behavior During Therapy: WFL for tasks assessed/performed Overall Cognitive Status: Within Functional Limits for tasks assessed                                        Exercises Exercises: Shoulder   Shoulder Instructions Shoulder Instructions Donning/doffing shirt without moving shoulder: Minimal assistance;Patient able to independently direct caregiver Method for sponge bathing under operated UE: Minimal assistance;Patient able to independently direct caregiver Donning/doffing sling/immobilizer: Moderate assistance;Patient able to independently direct caregiver Correct positioning of sling/immobilizer: Independent;Patient able to independently direct caregiver Pendulum exercises (written home exercise program): Patient able to independently direct caregiver ROM for elbow, wrist and digits of operated UE: Patient able to independently direct caregiver Sling wearing schedule (on at all times/off for ADL's): Patient able to independently direct caregiver Proper positioning of operated  UE when showering: Patient able to independently direct caregiver Positioning of UE while sleeping: Patient able to  independently direct caregiver    Home Living Family/patient expects to be discharged to:: Private residence Living Arrangements: Spouse/significant other Available Help at Discharge: Family;Available 24 hours/day Type of Home: [place]: Two level;Bed/bath upstairs     Bathroom Shower/Tub: Walk-in shower         Home Equipment: Grab bars - tub/shower          Prior Functioning/Environment Level of Independence: Independent                 OT Problem List: Impaired UE functional use;Pain         OT Goals(Current goals can be found in the care plan section) Acute Rehab OT Goals Patient Stated Goal: to feed myself OT Goal Formulation: With patient Time For Goal Achievement: 06/20/19 Potential to Achieve Goals: Good   AM-PAC OT "6 Clicks" Daily Activity     Outcome Measure Help from another person eating meals?: A Little Help from another person taking care of personal grooming?: A Little Help from another person toileting, which includes using toliet, bedpan, or urinal?: A Little Help from another person bathing (including washing, rinsing, drying)?: A Little Help from another person to put on and taking off regular upper body clothing?: A Little Help from another person to put on and taking off regular lower body clothing?: A Little 6 Click Score: 18   End of Session  Activity Tolerance: Patient tolerated treatment well Patient left: in chair;with call bell/phone within reach  OT Visit Diagnosis: Pain Pain - Right/Left: Right Pain - part of body: Shoulder                Time: [id number] OT Time Calculation (min): 43 min Charges:  OT General Charges $OT Visit: 1 Visit OT Evaluation $OT Eval Moderate Complexity: 1 Mod  Delbert Phenix OT Pager: [place] 06/06/2019, 10:52 AM

## 2019-06-06 NOTE — Plan of Care (Signed)
Patient discharged home in stable condition 

## 2019-06-13 ENCOUNTER — Other Ambulatory Visit: Payer: Self-pay

## 2019-06-13 MED ORDER — METOPROLOL TARTRATE 25 MG PO TABS: 12.5000 mg | ORAL_TABLET | Freq: Two times a day (BID) | ORAL | 3 refills | Status: AC

## 2019-06-16 ENCOUNTER — Other Ambulatory Visit: Payer: Self-pay

## 2019-06-16 DIAGNOSIS — E785 Hyperlipidemia, unspecified: Secondary | ICD-10-CM

## 2019-06-23 NOTE — Progress Notes (Signed)
Patient ID: Gabrielle [name]                 DOB: 11/18/1945                    MRN: [id number]     HPI: Gabrielle [name] is a 74 y.o. female patient of Dr Burt Knack referred to lipid clinic by Ermalinda Barrios, PA-C for elevated LDL on labs obtained at [facility] on 06/02/2019. PMH is significant for hx of CAD s/p inferolateral STEMI 2017 (treated with DES to OM1 and staged DES RCA, follow-up echo EF 60 to 65% normal wall motion) HTN, HLD, hypothyroidism, and pulmonary nodule.  Patient presents for initial appt with lipid clinic. Patient recently had shoulder surgery (right shoulder) - recovering well. She reports she stopped Maxzide due to "issues with sodium". She would like me to tell her providers that her blood pressures have been "good"; specifically she reports they trend ~120/80 mmHg. She is currently tolerating rosuvastatin and ezetimibe.   Current Medications: rosuvastatin 40 mg daily, ezetimibe 10 mg daily, OTC fish oil Intolerances: atorvastatin 80 mg daily (unsure - pt does not think she tolerated), pravastatin 40 mg daily (affects liver fxn) Risk Factors: CAD (s/p STEMI tx with DES), HTN, HLD, family hx (heart disease, HTN), former smoker  LDL goal: <70 mg/dL  Diet: 3 meals/day, husband is the cook -COVID-19 affected diet -enjoys carbs -no fast food -reeses puff cup maybe 1-2 times per day  Exercise: limited due to issues with back and peripheral neuropathy (cannot walk well) and [place]; tries to ride bike 2-3x each week 15-30 min at a time, laps around house for 15 min daily -enjoys tricycle at [employer]  Family History: Aneurysm in her mother; Diabetes in her brother and sister; Heart disease in her brother and father; Hyperlipidemia in her brother and sister; Hypertension in her brother and sister; MI in father.  Social History: reports that she quit smoking about 25 years ago. Her smoking use included cigarettes. She started smoking about 56 years ago. She has a  30.00 pack-year smoking history. She has never used smokeless tobacco. She reports current alcohol use of about 1.0-2.0 standard drinks of alcohol per day She reports that she does not use drugs.  Labs: 06/02/19 [facility]): TC 175 TG 132 HDL 76 LDL(calc) 76; rosuvastatin 40 mg daily and ezetimibe 10 mg daily 06/29/17 [facility]): TC 167 TG 177 HDL 64 LDL (calc) 67; rosuvastatin 40 mg daily 04/14/17 [facility]): TC 165 TG 109 HDL 54 LDL (calc) 90; rosuvastatin 40 mg daily 10/12/15 ([facility]): TC 180 TG 138 HDL 74 LDL 78; rosuvastatin 20 mg daily  Past Medical History:  Diagnosis Date  . Anemia   . Arthritis 01-17-11   degenerative disc disease-all joints  . Bursitis   . Cholecystitis with cholelithiasis 03/22/2018  . Cholesterol serum elevated 01-17-11   tx. meds  . GERD (gastroesophageal reflux disease) 01-17-11   tx. omeprazole  . Heart murmur    slight murmur  . High cholesterol   . Hypertension 01-17-11   tx. meds  . Hypothyroidism   . Leg pain    ABIs 2/18: normal bilaterally  . Neuropathy    FEET  . Osteoarthritis   . Peripheral neuropathy   . Pre-diabetes   . Pulmonary nodule   . Seasonal allergies   . Sinus problem 01-17-11   tx. Claritin D  . ST elevation (STEMI) myocardial infarction involving left circumflex coronary artery ([place])  08/21/2015    Current Outpatient Medications on File Prior to Visit  Medication Sig Dispense Refill  . acetaminophen (TYLENOL) 650 MG CR tablet Take 1,950 mg by mouth daily.    Marland Kitchen aspirin EC 81 MG tablet Take 81 mg by mouth at bedtime.     . benazepril (LOTENSIN) 20 MG tablet Take 1 tablet (20 mg total) by mouth 2 (two) times daily. 180 tablet 3  . Calcium Carbonate-Vitamin D (CALCIUM 600 + D PO) Take 1 tablet by mouth 2 (two) times daily.      . Cholecalciferol (VITAMIN D) 2000 units CAPS Take 2,000 Units by mouth daily.    . cyclobenzaprine (FLEXERIL) 10 MG tablet Take 1 tablet (10 mg total) by mouth 3 (three) times daily as needed for muscle spasms. 30  tablet 1  . docusate sodium (COLACE) 100 MG capsule Take 100 mg by mouth daily.     Marland Kitchen ezetimibe (ZETIA) 10 MG tablet Take 1 tablet (10 mg total) by mouth daily. 90 tablet 2  . fish oil-omega-3 fatty acids 1000 MG capsule Take 1 g by mouth every evening.     . gabapentin (NEURONTIN) 600 MG tablet Take 600 mg by mouth 2 (two) times daily.    . Glucosamine-Chondroit-Vit C-Mn (GLUCOSAMINE CHONDR 1500 COMPLX PO) Take 2 tablets by mouth daily. 1500/1200    . levothyroxine (SYNTHROID, LEVOTHROID) 75 MCG tablet Take 75 mcg by mouth daily before breakfast.     . Liniments (SALONPAS PAIN RELIEF PATCH EX) Place 1 patch onto the skin daily as needed (pain).    Marland Kitchen loratadine-pseudoephedrine (CLARITIN-D 24-HOUR) 10-240 MG per 24 hr tablet Take 1 tablet by mouth every evening.     . meclizine (ANTIVERT) 25 MG tablet Take 25 mg by mouth 3 (three) times daily as needed for dizziness.    . Melatonin 10 MG TABS Take 10 mg by mouth at bedtime.     . metoprolol tartrate (LOPRESSOR) 25 MG tablet Take 0.5 tablets (12.5 mg total) by mouth 2 (two) times daily. 90 tablet 3  . nitroGLYCERIN (NITROSTAT) 0.4 MG SL tablet Place 0.4 mg under the tongue every 5 (five) minutes as needed for chest pain.    . NON FORMULARY Place 1 drop under the tongue daily. CBD Oil    . omeprazole (PRILOSEC) 20 MG capsule Take 20 mg by mouth daily with breakfast.     . ondansetron (ZOFRAN) 4 MG tablet Take 1 tablet (4 mg total) by mouth every 8 (eight) hours as needed for nausea or vomiting. 10 tablet 0  . oxyCODONE-acetaminophen (PERCOCET) 5-325 MG tablet Take 1 tablet by mouth every 4 (four) hours as needed (max 6 q). 10 tablet 0  . polyethylene glycol (MIRALAX / GLYCOLAX) packet Take 17 g by mouth daily as needed for mild constipation.     . potassium chloride (KLOR-CON) 10 MEQ tablet Take 1 tablet (10 mEq total) by mouth daily. 90 tablet 3  . rosuvastatin (CRESTOR) 40 MG tablet Take 1 tablet (40 mg total) by mouth at bedtime. 90 tablet 3  .  triamterene-hydrochlorothiazide (MAXZIDE-25) 37.5-25 MG tablet Take 1 tablet by mouth daily.  1   No current facility-administered medications on file prior to visit.    Allergies  Allergen Reactions  . Penicillins Other (See Comments)    Allergy as an infant - no other information available Has patient had a PCN reaction causing immediate rash, facial/tongue/throat swelling, SOB or lightheadedness with hypotension: Unknown Has patient had a PCN reaction causing severe  rash involving mucus membranes or skin necrosis: Unknown Has patient had a PCN reaction that required hospitalization: No Has patient had a PCN reaction occurring within the last 10 years: No If all of the above answers are "NO", then may proceed with Cephalospor  . 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)) - Praluent - $47 for 30 day supply (preferred local pharmacy); $131 for 90 day supply (mail order pharmacy) - Repatha - $47 for 30 day supply (preferred local pharmacy); $131 for 90 day supply (mail order pharmacy) - Nexlizet - not on formulary - Nexletol - not on formulary - Rosuvastatin - $2 for 30 day supply (preferred local pharmacy); $0 for 90 day supply (mail order pharmacy) - Ezetimibe - $8 for 30 day supply (preferred local pharmacy); $0 for 90 day supply (mail order pharmacy) - Vascepa - $100 for 30 day supply (preferred local pharmacy); $290 for 90 day supply (mail order pharmacy)  1. Hyperlipidemia - LDL goal < 70 mg/dL; therefore, pt is not at goal. Discussed options with patient - 1) rosuvastatin + Zetia --> rosuvastatin + Nexlizet OR 2) rosuvastatin + Zetia --> rosuvastatin + PCSK9 inhibitor. Patient is agreeable to continuing rosuvastatin 40 mg daily and initiating Nexlizet 180-10 mg daily. Also, noticed patient was on OTC fish oil. Will switch from OTC fish oil to Vascepa 2 pills twice daily  for ASCVD benefit. Thoroughly discussed mechanism of action, efficacy, side effects, dosing and administration. Nexlizet prior authorization was approved through 01/30/20 and [place] was approved. Follow up labs have been scheduled.  Thank you for involving pharmacy to assist in providing this patient's care.   Drexel Iha, PharmD PGY2 Ambulatory Care Pharmacy Resident

## 2019-06-24 ENCOUNTER — Ambulatory Visit (INDEPENDENT_AMBULATORY_CARE_PROVIDER_SITE_OTHER): Payer: Medicare Other | Admitting: Pharmacist

## 2019-06-24 ENCOUNTER — Other Ambulatory Visit: Payer: Self-pay

## 2019-06-24 DIAGNOSIS — E785 Hyperlipidemia, unspecified: Secondary | ICD-10-CM

## 2019-06-24 MED ORDER — ICOSAPENT ETHYL 1 G PO CAPS: 2.0000 g | ORAL_CAPSULE | Freq: Two times a day (BID) | ORAL | 3 refills | Status: DC

## 2019-06-24 MED ORDER — NEXLIZET 180-10 MG PO TABS: 1.0000 | ORAL_TABLET | Freq: Every day | ORAL | 3 refills | Status: DC

## 2019-06-24 MED ORDER — ICOSAPENT ETHYL 1 G PO CAPS
2.0000 g | ORAL_CAPSULE | Freq: Two times a day (BID) | ORAL | 11 refills | Status: DC
Start: 2019-06-24 — End: 2019-06-24

## 2019-06-24 MED ORDER — NEXLIZET 180-10 MG PO TABS
1.0000 | ORAL_TABLET | Freq: Every day | ORAL | 11 refills | Status: DC
Start: 2019-06-24 — End: 2019-06-24

## 2019-06-24 NOTE — Patient Instructions (Addendum)
It was a pleasure seeing you in clinic today Mrs. Gabrielle [name]!  Today the plan is... 1. Continue rosuvastatin 40 mg daily, Zetia 10 mg daily, and fish oil 2. Once prior authorizations are approved, we will change you from Zetia 10 mg daily --> Nexlizet (bempedoic acid + Zetia) daily (one pill daily) AND fish oil --> Vascepa 2 grams twice daily (2 pills twice daily). 3. You will get a call about your prior authorization being approved and to discuss cost/apply to [employer]. You will also get a call regarding whent o pick up Nexlizet and Vascepa from the pharmacy.  Please call the PharmD clinic at [phone] if you have any questions that you would like to speak with a pharmacist about Gabrielle [name], [place], [place]).

## 2019-08-11 ENCOUNTER — Ambulatory Visit: Payer: Medicare Other | Admitting: Primary Care

## 2019-08-11 ENCOUNTER — Other Ambulatory Visit: Payer: Self-pay

## 2019-08-11 ENCOUNTER — Encounter: Payer: Self-pay | Admitting: Primary Care

## 2019-08-11 VITALS — BP 118/68 | HR 75 | Temp 98.0°F | Ht 60.0 in | Wt 184.0 lb

## 2019-08-11 DIAGNOSIS — Z9189 Other specified personal risk factors, not elsewhere classified: Secondary | ICD-10-CM | POA: Diagnosis not present

## 2019-08-11 DIAGNOSIS — R911 Solitary pulmonary nodule: Secondary | ICD-10-CM | POA: Diagnosis not present

## 2019-08-11 NOTE — Patient Instructions (Addendum)
PET scan in December 2020 showed stable Left upper lobe nodule, we will repeat this in December 2021 to ensure stability   Orders: Cancel upcoming CT chest  Rescheduled CT chest wo contrast for December 2021 re: pulmonary nodule <1cm   Follow-up: 6 months with Dr. Loanne Drilling

## 2019-08-11 NOTE — Progress Notes (Signed)
@Patient  ID: Gabrielle [name], female    DOB: 11-19-1945, 74 y.o.   MRN: [id number]  Chief Complaint  Patient presents with  . Follow-up    Referring provider: Hulan Fess, MD  HPI: 74 year old female, former smoker quit 1995 (30-pack-year history). Past medical history significant for coronary artery disease, hypertension, osteoarthritis, heart murmur, lung nodules.  Patient of Dr. Loanne Drilling, last seen on 11/18/2018 for video visit.  Repeat CT chest demonstrates stable nodules.  Patient will continue surveillance with imaging first diagnostic testing.  Her risk for malignancy is low to intermediate 6.23%.  Patient would like to continue surveillance by imaging. Will need to monitor nodules for 2 years to ensure stability  08/11/2019 Patient presents today for follow-up visit for pulmonary nodules.  Patient had a PET scan in December 2020 with no evidence of primary well differentiated neuroendocrine tumor in the abdomen and pelvis.  No liver metastasis.  No evidence of thoracic malignancy.  She had small stable nodule left upper lobe with NO radiotracer activity. She has an upcoming CT chest scheduled for July 19th for lung nodule surveillance. Ok to push out until December d/t recent negative PET. Denies shortness of breath, cough, chest pain, weight loss.    Imaging: 11/15/19 CT chest w contrast- Three months stability of left upper lobe ground-glass nodularity in a peribronchovascular distribution. Favor infectious or inflammatory etiologies of indeterminate acuity. Similar left lower lobe 8 mm pulmonary nodule, likely a subpleural lymph node. Per consensus criteria, this warrants follow-up at 3-9 months.   PET scan - Incidental CT finding: Angular nodule along the LEFT oblique fissure measuring 5 mm on image 87/3 is unchanged. No radiotracer activity. No evidence of primary well differentiated neuroendocrine tumor in the abdomen pelvis. No liver metastasis. No evidence thoracic  malignancy. Stable small nodule in the LEFT upper lobe.  Allergies  Allergen Reactions  . Pravastatin Other (See Comments)    Effects liver function  . Penicillins Other (See Comments)    Allergy as an infant - no other information available Has patient had a PCN reaction causing immediate rash, facial/tongue/throat swelling, SOB or lightheadedness with hypotension: Unknown Has patient had a PCN reaction causing severe rash involving mucus membranes or skin necrosis: Unknown Has patient had a PCN reaction that required hospitalization: No Has patient had a PCN reaction occurring within the last 10 years: No If all of the above answers are "NO", then may proceed with Cephalospor    Immunization History  Administered Date(s) Administered  . Influenza Whole 11/15/2017  . Influenza, High Dose Seasonal PF 09/24/2018  . Influenza,inj,quad, With Preservative 01/31/2016, 09/24/2018  . Moderna SARS-COVID-2 Vaccination 02/23/2019, 03/23/2019  . Pneumococcal Conjugate-13 12/30/2013  . Pneumococcal Polysaccharide-23 10/25/2011  . Zoster Recombinat (Shingrix) 12/26/2017, 03/06/2018, 03/11/2018    Past Medical History:  Diagnosis Date  . Anemia   . Arthritis 01-17-11   degenerative disc disease-all joints  . Bursitis   . Cholecystitis with cholelithiasis 03/22/2018  . Cholesterol serum elevated 01-17-11   tx. meds  . GERD (gastroesophageal reflux disease) 01-17-11   tx. omeprazole  . Heart murmur    slight murmur  . High cholesterol   . Hypertension 01-17-11   tx. meds  . Hypothyroidism   . Leg pain    ABIs 2/18: normal bilaterally  . Neuropathy    FEET  . Osteoarthritis   . Peripheral neuropathy   . Pre-diabetes   . Pulmonary nodule   . Seasonal allergies   . Sinus problem 01-17-11  tx. Claritin D  . ST elevation (STEMI) myocardial infarction involving left circumflex coronary artery ([place]) 08/21/2015    Tobacco History: Social History   Tobacco Use  Smoking Status  Former Smoker  . Packs/day: 1.00  . Years: 30.00  . Pack years: 30.00  . Types: Cigarettes  . Start date: 74  . Quit date: 08/13/1993  . Years since quitting: 26.0  Smokeless Tobacco Never Used   Counseling given: Not Answered   Outpatient Medications Prior to Visit  Medication Sig Dispense Refill  . acetaminophen (TYLENOL) 650 MG CR tablet Take 1,950 mg by mouth daily.    Marland Kitchen aspirin EC 81 MG tablet Take 81 mg by mouth at bedtime.     . Bempedoic Acid-Ezetimibe (NEXLIZET) 180-10 MG TABS Take 1 tablet by mouth daily. 90 tablet 3  . benazepril (LOTENSIN) 20 MG tablet Take 1 tablet (20 mg total) by mouth 2 (two) times daily. 180 tablet 3  . Calcium Carbonate-Vitamin D (CALCIUM 600 + D PO) Take 1 tablet by mouth 2 (two) times daily.      . Cholecalciferol (VITAMIN D) 2000 units CAPS Take 2,000 Units by mouth daily.    Marland Kitchen docusate sodium (COLACE) 100 MG capsule Take 100 mg by mouth daily.     Marland Kitchen gabapentin (NEURONTIN) 600 MG tablet Take 600 mg by mouth 2 (two) times daily.    . Glucosamine-Chondroit-Vit C-Mn (GLUCOSAMINE CHONDR 1500 COMPLX PO) Take 2 tablets by mouth daily. 1500/1200    . icosapent Ethyl (VASCEPA) 1 g capsule Take 2 capsules (2 g total) by mouth 2 (two) times daily. 360 capsule 3  . levothyroxine (SYNTHROID, LEVOTHROID) 75 MCG tablet Take 75 mcg by mouth daily before breakfast.     . Liniments (SALONPAS PAIN RELIEF PATCH EX) Place 1 patch onto the skin daily as needed (pain).    Marland Kitchen loratadine-pseudoephedrine (CLARITIN-D 24-HOUR) 10-240 MG per 24 hr tablet Take 1 tablet by mouth every evening.     . meclizine (ANTIVERT) 25 MG tablet Take 25 mg by mouth 3 (three) times daily as needed for dizziness.    . Melatonin 10 MG TABS Take 10 mg by mouth at bedtime.     . metoprolol tartrate (LOPRESSOR) 25 MG tablet Take 0.5 tablets (12.5 mg total) by mouth 2 (two) times daily. 90 tablet 3  . nitroGLYCERIN (NITROSTAT) 0.4 MG SL tablet Place 0.4 mg under the tongue every 5 (five) minutes  as needed for chest pain.    . NON FORMULARY Place 1 drop under the tongue daily. CBD Oil    . omeprazole (PRILOSEC) 20 MG capsule Take 20 mg by mouth daily with breakfast.     . polyethylene glycol (MIRALAX / GLYCOLAX) packet Take 17 g by mouth daily as needed for mild constipation.     . potassium chloride (KLOR-CON) 10 MEQ tablet Take 1 tablet (10 mEq total) by mouth daily. 90 tablet 3  . rosuvastatin (CRESTOR) 40 MG tablet Take 1 tablet (40 mg total) by mouth at bedtime. 90 tablet 3   No facility-administered medications prior to visit.      Review of Systems  Review of Systems  Constitutional: Negative.  Negative for fever and unexpected weight change.  Respiratory: Negative.  Negative for cough, chest tightness, shortness of breath and wheezing.   Cardiovascular: Negative.     Physical Exam  BP 118/68 (BP Location: Left Arm, Cuff Size: Normal)   Pulse 75   Temp 98 F (36.7 C) (Oral)   Ht 5' (  1.524 m)   Wt 184 lb (83.5 kg)   SpO2 99%   BMI 35.94 kg/m  Physical Exam Constitutional:      Appearance: Normal appearance.  HENT:     Head: Normocephalic and atraumatic.     Mouth/Throat:     Mouth: Mucous membranes are moist.     Pharynx: Oropharynx is clear.  Cardiovascular:     Rate and Rhythm: Normal rate and regular rhythm.  Pulmonary:     Effort: Pulmonary effort is normal.     Breath sounds: Normal breath sounds.  Musculoskeletal:        General: Normal range of motion.  Skin:    General: Skin is warm and dry.  Neurological:     Mental Status: She is alert.  Psychiatric:        Mood and Affect: Mood normal.        Behavior: Behavior normal.        Thought Content: Thought content normal.        Judgment: Judgment normal.      Lab Results:  CBC    Component Value Date/Time   WBC 6.8 05/28/2019 1012   RBC 4.18 05/28/2019 1012   HGB 12.9 05/28/2019 1012   HGB 12.7 01/29/2019 0952   HCT 40.2 05/28/2019 1012   PLT 188 05/28/2019 1012   PLT 192  01/29/2019 0952   MCV 96.2 05/28/2019 1012   MCH 30.9 05/28/2019 1012   MCHC 32.1 05/28/2019 1012   RDW 13.0 05/28/2019 1012   LYMPHSABS 1.8 01/29/2019 0952   MONOABS 0.6 01/29/2019 0952   EOSABS 0.3 01/29/2019 0952   BASOSABS 0.0 01/29/2019 0952    BMET    Component Value Date/Time   NA 133 (L) 05/28/2019 1012   K 4.8 05/28/2019 1012   CL 99 05/28/2019 1012   CO2 26 05/28/2019 1012   GLUCOSE 123 (H) 05/28/2019 1012   BUN 30 (H) 05/28/2019 1012   CREATININE 1.00 05/28/2019 1012   CREATININE 1.13 (H) 01/29/2019 0952   CALCIUM 9.5 05/28/2019 1012   GFRNONAA 56 (L) 05/28/2019 1012   GFRNONAA 48 (L) 01/29/2019 0952   GFRAA >60 05/28/2019 1012   GFRAA 56 (L) 01/29/2019 0952    BNP    Component Value Date/Time   BNP 27.7 08/21/2015 1750    ProBNP No results found for: PROBNP  Imaging: No results found.   Assessment & Plan:   Pulmonary nodule less than 1 cm in diameter with moderate to high risk for malignant neoplasm - PET scan in December 2020 showed stable left upper lobe nodule with no radiotracer activity.  Patient is considered low-intermediate risk for malignancy d/t hx smoking. Needs monitoring to ensure stability x 2 years.  Reschedule CT chest wo contrast for December 2021 - Follow-up in 6 months with Dr. Reva Bores, NP 08/11/2019

## 2019-08-11 NOTE — Assessment & Plan Note (Signed)
-   PET scan in December 2020 showed stable left upper lobe nodule with no radiotracer activity.  Patient is considered low-intermediate risk for malignancy d/t hx smoking. Needs monitoring to ensure stability x 2 years.  Reschedule CT chest wo contrast for December 2021 - Follow-up in 6 months with Dr. Loanne Drilling

## 2019-08-15 ENCOUNTER — Other Ambulatory Visit: Payer: Self-pay | Admitting: Pharmacist

## 2019-08-15 DIAGNOSIS — E785 Hyperlipidemia, unspecified: Secondary | ICD-10-CM

## 2019-08-15 MED ORDER — ICOSAPENT ETHYL 1 G PO CAPS: 2.0000 g | ORAL_CAPSULE | Freq: Two times a day (BID) | ORAL | 3 refills | Status: DC

## 2019-08-18 ENCOUNTER — Ambulatory Visit (HOSPITAL_COMMUNITY): Payer: Medicare Other

## 2019-09-29 ENCOUNTER — Other Ambulatory Visit: Payer: Self-pay | Admitting: Cardiovascular Disease

## 2019-10-15 ENCOUNTER — Other Ambulatory Visit: Payer: Self-pay | Admitting: Cardiovascular Disease

## 2019-10-15 DIAGNOSIS — E785 Hyperlipidemia, unspecified: Secondary | ICD-10-CM

## 2019-10-16 ENCOUNTER — Other Ambulatory Visit: Payer: Medicare Other

## 2019-10-16 MED ORDER — NEXLIZET 180-10 MG PO TABS: 1.0000 | ORAL_TABLET | Freq: Every day | ORAL | 2 refills | Status: DC

## 2019-10-27 ENCOUNTER — Other Ambulatory Visit: Payer: Self-pay

## 2019-10-27 ENCOUNTER — Other Ambulatory Visit: Payer: Medicare Other | Admitting: *Deleted

## 2019-10-27 DIAGNOSIS — E785 Hyperlipidemia, unspecified: Secondary | ICD-10-CM

## 2019-10-27 LAB — LIPID PANEL
Chol/HDL Ratio: 2.9 ratio (ref 0.0–4.4)
Cholesterol, Total: 146 mg/dL (ref 100–199)
HDL: 51 mg/dL (ref >39)
LDL Chol Calc (NIH): 67 mg/dL (ref 0–99)
Triglycerides: 168 mg/dL — ABNORMAL HIGH (ref 0–149)
VLDL Cholesterol Cal: 28 mg/dL (ref 5–40)

## 2019-10-27 LAB — HEPATIC FUNCTION PANEL
ALT: 20 IU/L (ref 0–32)
AST: 23 IU/L (ref 0–40)
Albumin: 4.5 g/dL (ref 3.7–4.7)
Alkaline Phosphatase: 89 IU/L (ref 44–121)
Bilirubin Total: 0.3 mg/dL (ref 0.0–1.2)
Bilirubin, Direct: <0.1 mg/dL (ref 0.00–0.40)
Total Protein: 7 g/dL (ref 6.0–8.5)

## 2019-10-29 IMAGING — CT CT CHEST WITH CONTRAST
3 of 5 series · 14 of 36 positions shown, 17 images · IV contrast (OMNIPAQUE)
Comparison: AP CT on 08/13/2013; no prior chest CT

CLINICAL DATA: Abdominal pain and distention. Shortness of breath.
Unintentional weight loss.

EXAM:
CT CHEST, ABDOMEN, AND PELVIS WITH CONTRAST
TECHNIQUE: Multidetector CT imaging of the chest, abdomen and pelvis was
performed following the standard protocol during bolus
administration of intravenous contrast.
CONTRAST:  100mL OMNIPAQUE IOHEXOL 300 MG/ML  SOLN

[Series 2: cap with · axial · 0.71mm/px · z∈[-757,-282]mm · 9 of 119 slices shown, 12 images]
[im 12/119  mediastinal]
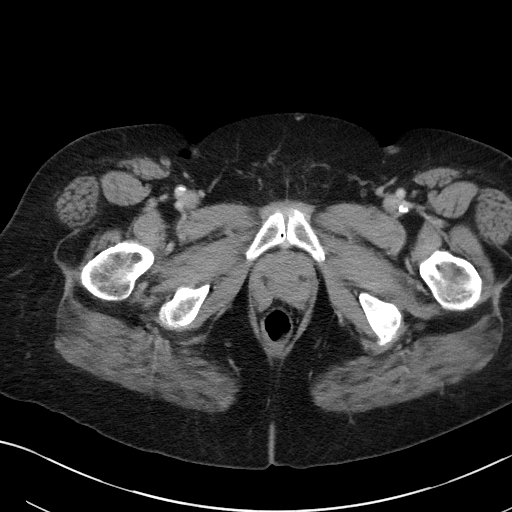
[im 12/119  lung]
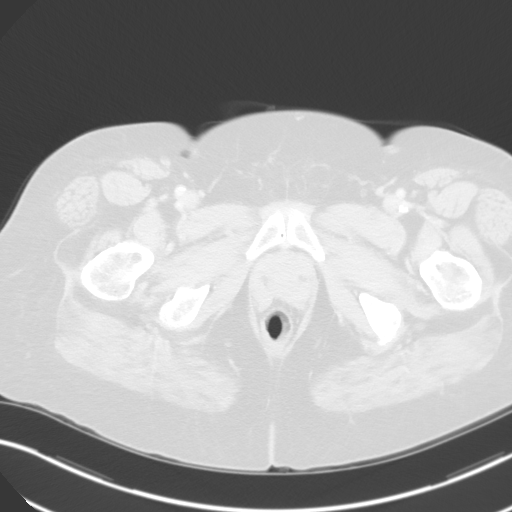
[im 24/119  lung]
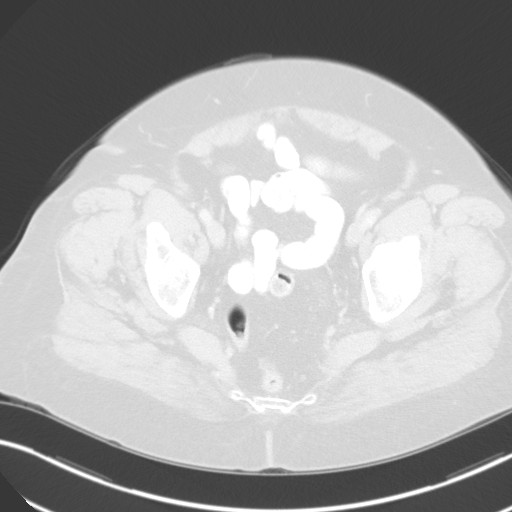
[im 36/119  lung]
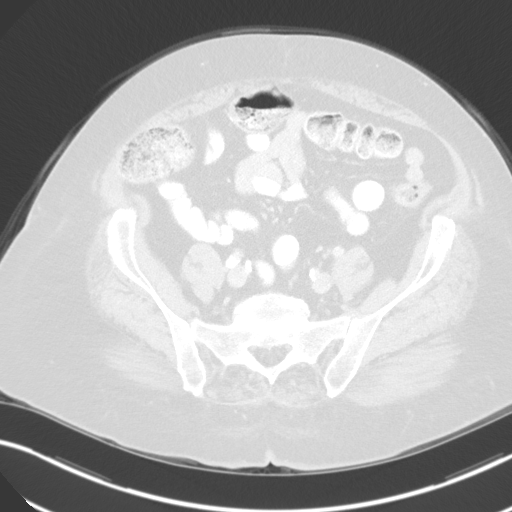
[im 48/119  lung]
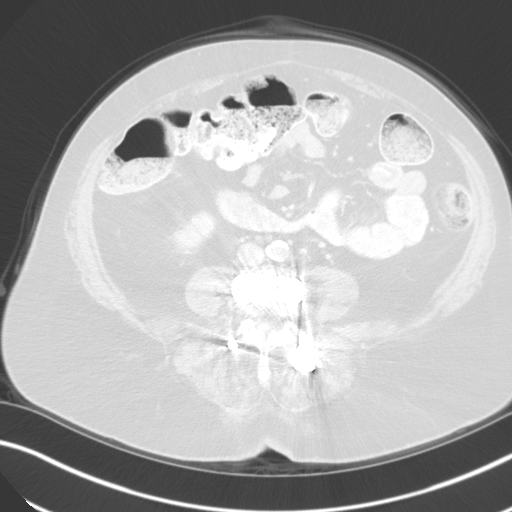
[im 60/119  mediastinal]
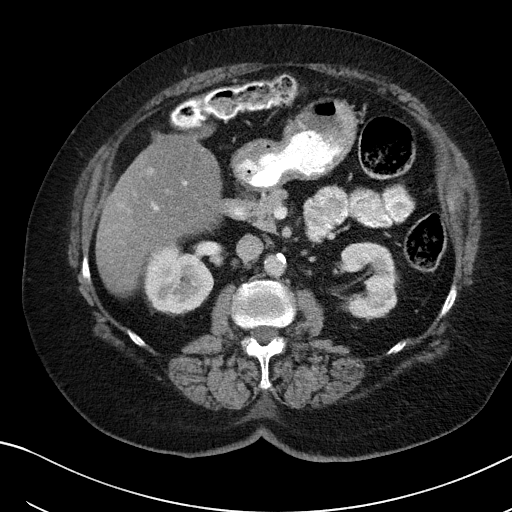
[im 60/119  lung]
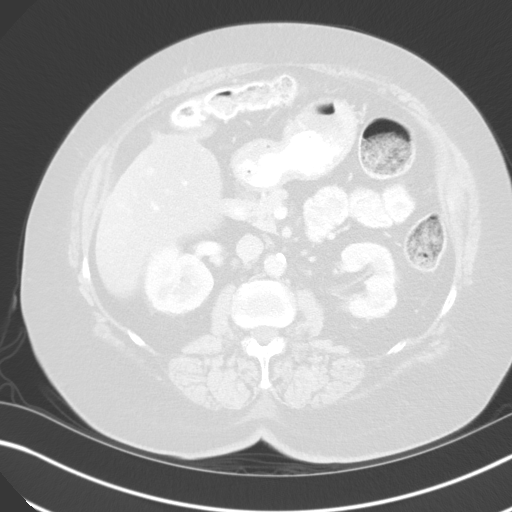
[im 71/119  lung]
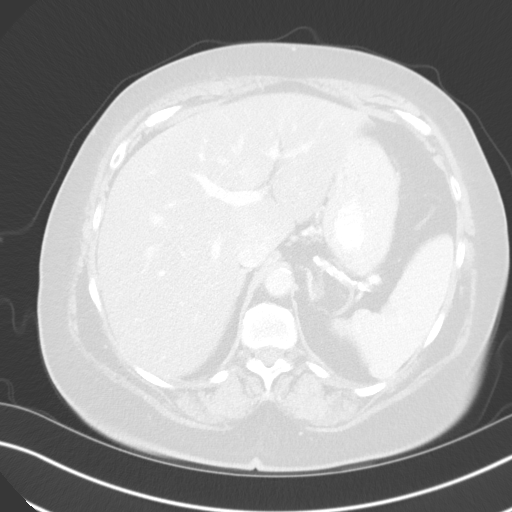
[im 83/119  lung]
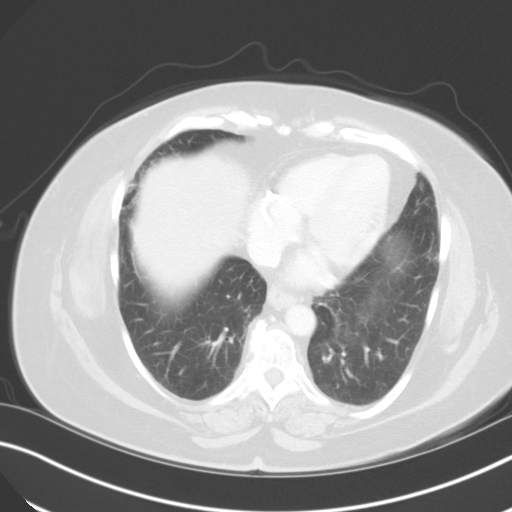
[im 95/119  lung]
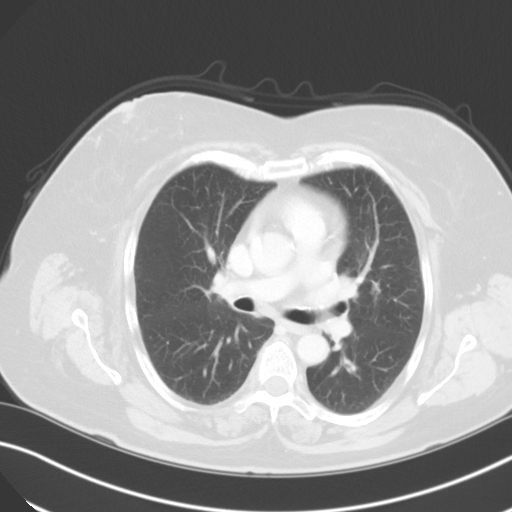
[im 107/119  mediastinal]
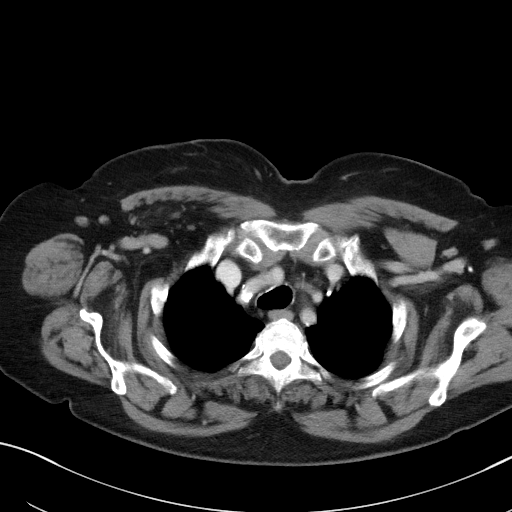
[im 107/119  lung]
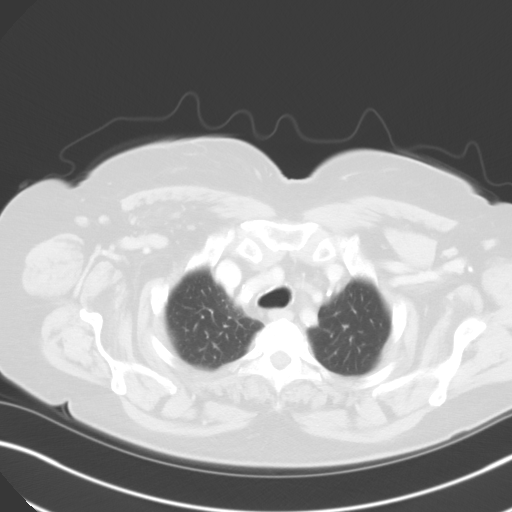

[Series 4: lung · axial · 0.71mm/px · z∈[-460,-416]mm · 2 of 130 slices shown]
[im 11/130  lung]
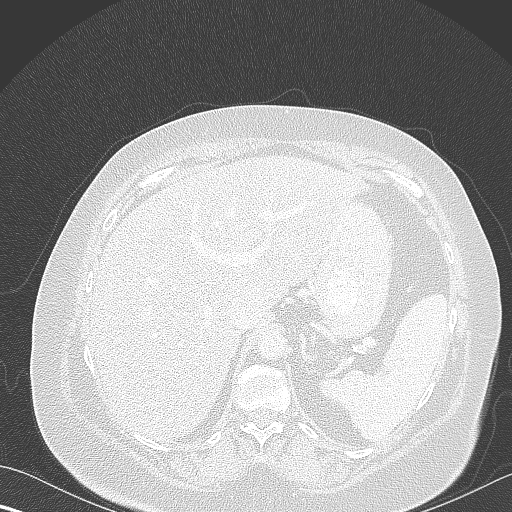
[im 33/130  lung]
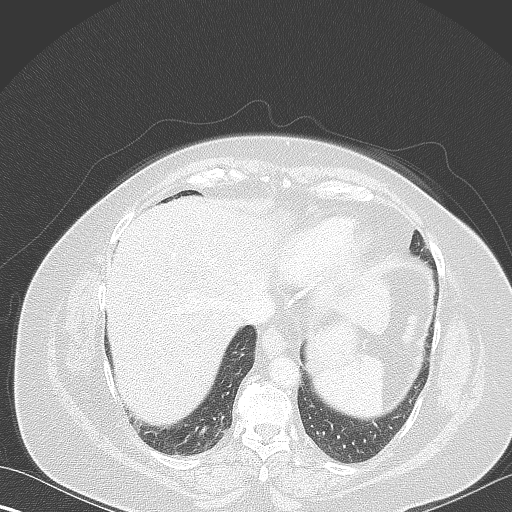

[Series 5: coronals · coronal · 0.82mm/px · 3 of 170 slices shown]
[im 34/170  lung]
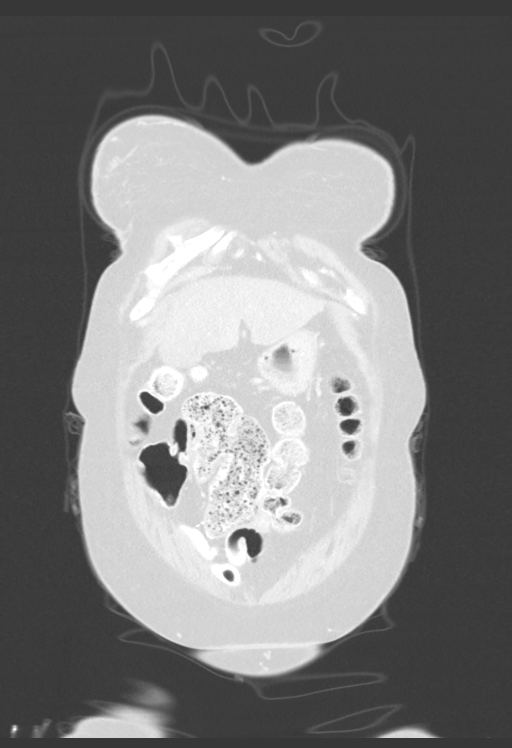
[im 68/170  lung]
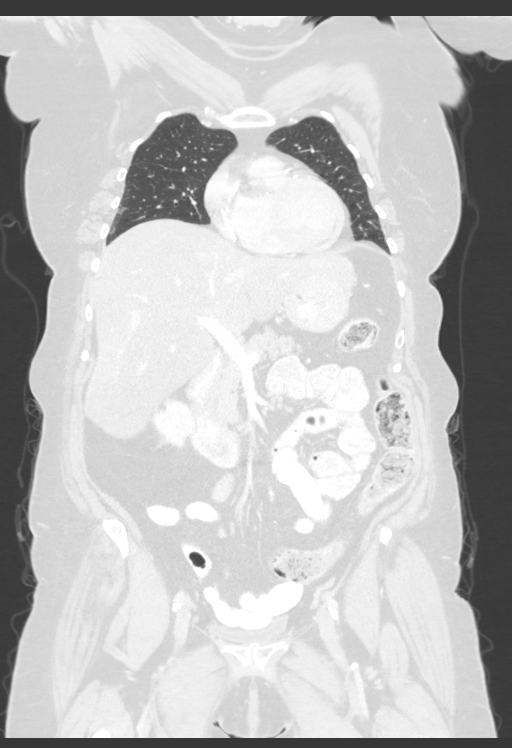
[im 102/170  lung]
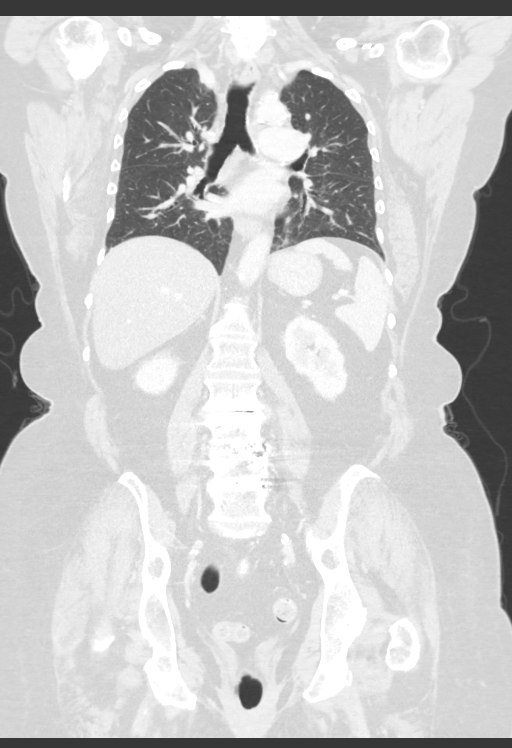

[14 of 36 positions shown; findings below may reference images not displayed]

FINDINGS: CT CHEST FINDINGS

Cardiovascular: No acute findings. Aortic and coronary artery
atherosclerosis.

Mediastinum/Lymph Nodes: No masses or pathologically enlarged lymph
nodes identified.

Lungs/Pleura: Several left upper lobe pulmonary nodules are seen
measuring up to 9 mm. No evidence of pulmonary infiltrate or pleural
effusion.

Musculoskeletal:  No suspicious bone lesions identified.

CT ABDOMEN AND PELVIS FINDINGS

Hepatobiliary: No masses identified. Stable moderate steatosis.
Prior cholecystectomy. No evidence of biliary obstruction.

Pancreas:  No mass or inflammatory changes.

Spleen:  Within normal limits in size and appearance.

Adrenals/Urinary tract:  No masses or hydronephrosis.

Stomach/Bowel: No evidence of obstruction, inflammatory process, or
abnormal fluid collections.

Vascular/Lymphatic: No pathologically enlarged lymph nodes
identified. No abdominal aortic aneurysm. Aortic atherosclerosis.

Reproductive: Prior hysterectomy noted. Adnexal regions are
unremarkable in appearance.

Other:  None.

Musculoskeletal:  No suspicious bone lesions identified.
IMPRESSION: 1. Several indeterminate left upper lobe pulmonary nodules measuring
up to 9 mm. Non-contrast chest CT at 3-6 months is recommended. If
the nodules are stable at time of repeat CT, then future CT at 18-24
months (from today's scan) is considered optional for low-risk
patients, but is recommended for high-risk patients. This
recommendation follows the consensus statement: Guidelines for
Management of Incidental Pulmonary Nodules Detected on CT Images:
2. No other masses or lymphadenopathy identified.
3. Stable moderate hepatic steatosis.
4. Aortic and coronary artery atherosclerosis.

## 2019-11-11 ENCOUNTER — Encounter: Payer: Self-pay | Admitting: Cardiovascular Disease

## 2019-11-11 ENCOUNTER — Other Ambulatory Visit: Payer: Self-pay

## 2019-11-11 ENCOUNTER — Ambulatory Visit: Payer: Medicare Other | Admitting: Cardiovascular Disease

## 2019-11-11 VITALS — BP 102/60 | HR 82 | Ht 60.0 in | Wt 183.0 lb

## 2019-11-11 DIAGNOSIS — I2510 Atherosclerotic heart disease of native coronary artery without angina pectoris: Secondary | ICD-10-CM

## 2019-11-11 DIAGNOSIS — E782 Mixed hyperlipidemia: Secondary | ICD-10-CM

## 2019-11-11 DIAGNOSIS — I10 Essential (primary) hypertension: Secondary | ICD-10-CM

## 2019-11-11 NOTE — Progress Notes (Signed)
Cardiology Office Note:    Date:  11/11/2019   ID:  Gabrielle [name], DOB 1945/07/11, MRN [id number]  PCP:  Hulan Fess, MD  [facility] Cardiologist:  Sherren Mocha, MD  [place] Electrophysiologist:  None   Referring MD: Hulan Fess, MD   Chief Complaint  Patient presents with  . Coronary Artery Disease    History of Present Illness:    Gabrielle [name] is a 74 y.o. female with a hx of coronary artery disease, presenting for follow-up evaluation.  She initially presented in 2017 with an inferolateral STEMI found to have occlusion of the first OM branch of the circumflex, treated with primary PCI.  She then underwent staged stenting of the right coronary artery the.  LVEF at follow-up was 60 to 65% with normal wall motion.  Comorbid conditions include hypertension, hyperlipidemia, and hypothyroidism.  She was last seen in April 2021 for preoperative cardiovascular assessment by Ermalinda Barrios, PA.  She was cleared for surgery as she was clinically stable with no angina at a good workload. Tolerated shoulder replacement surgery without problems.   She is here alone today. She just returned from a trip to some of the [place] out in the [place]. She feels like she did very well on the trip, able to walk and stay active without significant limitation. She does admit to mild shortness of breath with exertion. No palpitations, orthopnea, PND, or chest pain.   Past Medical History:  Diagnosis Date  . Anemia   . Arthritis 01-17-11   degenerative disc disease-all joints  . Bursitis   . Cholecystitis with cholelithiasis 03/22/2018  . Cholesterol serum elevated 01-17-11   tx. meds  . GERD (gastroesophageal reflux disease) 01-17-11   tx. omeprazole  . Heart murmur    slight murmur  . High cholesterol   . Hypertension 01-17-11   tx. meds  . Hypothyroidism   . Leg pain    ABIs 2/18: normal bilaterally  . Neuropathy    FEET  . Osteoarthritis   . Peripheral neuropathy    . Pre-diabetes   . Pulmonary nodule   . Seasonal allergies   . Sinus problem 01-17-11   tx. Claritin D  . ST elevation (STEMI) myocardial infarction involving left circumflex coronary artery ([place]) 08/21/2015    Past Surgical History:  Procedure Laterality Date  . ABDOMINAL HYSTERECTOMY  01-17-11   '93-Hysterectomy-heavy bleeding  . ANTERIOR LATERAL LUMBAR FUSION WITH PERCUTANEOUS SCREW 1 LEVEL N/A 03/07/2017   Procedure: XLIF L3-4;  Surgeon: Melina Schools, MD;  Location: [facility];  Service: Orthopedics;  Laterality: N/A;  4 hrs  . APPENDECTOMY    . BACK SURGERY  01-17-11   04-20-10-T-Lift lumbar fusion  . [facility] PROCEDURE  01-17-11   Bladder sling  . CARDIAC CATHETERIZATION N/A 08/21/2015   Procedure: Left Heart Cath and Coronary Angiography;  Surgeon: Sherren Mocha, MD;  Location: [facility];  Service: Cardiovascular;  Laterality: N/A;  . CARDIAC CATHETERIZATION N/A 08/21/2015   Procedure: Coronary Stent Intervention;  Surgeon: Sherren Mocha, MD;  Location: [place] [facility];  Service: Cardiovascular;  Laterality: N/A;  . CARDIAC CATHETERIZATION N/A 08/23/2015   Procedure: Coronary Stent Intervention;  Surgeon: Leonie Man, MD;  Location: [place] [facility];  Service: Cardiovascular;  Laterality: N/A;  promus 2.5x16 in RCA  . CARDIAC CATHETERIZATION N/A 08/23/2015   Procedure: Left Heart Cath and Coronary Angiography;  Surgeon: Leonie Man, MD;  Location: [place] [facility];  Service: Cardiovascular;  Laterality: N/A;  . CARPAL TUNNEL RELEASE  01-17-11   '02-left, also right  . CHOLECYSTECTOMY N/A 03/22/2018   Procedure: LAPAROSCOPIC CHOLECYSTECTOMY;  Surgeon: Fanny Skates, MD;  Location: [facility];  Service: General;  Laterality: N/A;  . COLONOSCOPY    . HARDWARE REMOVAL Right 05/15/2013   Procedure: REMOVAL RIGHT L4-L5 PEDICLE SCREWS AND ROD ;  Surgeon: Melina Schools, MD;  Location: [facility];  Service: Orthopedics;  Laterality: Right;  . JOINT REPLACEMENT  01-17-11    RTKA'  02  . LAPAROSCOPIC APPENDECTOMY N/A 08/13/2013   Procedure: APPENDECTOMY LAPAROSCOPIC;  Surgeon: Pedro Earls, MD;  [facility]: [facility];  Service: General;  Laterality: N/A;  . TONSILLECTOMY  01-17-11   child  . TOTAL KNEE ARTHROPLASTY  01/19/2011   Procedure: TOTAL KNEE ARTHROPLASTY;  Surgeon: Johnn Hai;  Location: [facility];  Service: Orthopedics;  Laterality: Left;  . TOTAL SHOULDER ARTHROPLASTY Right 06/05/2019   Procedure: TOTAL SHOULDER ARTHROPLASTY;  Surgeon: Justice Britain, MD;  Location: [facility];  Service: Orthopedics;  Laterality: Right;  160min    Current Medications: Current Meds  Medication Sig  . acetaminophen (TYLENOL) 650 MG CR tablet Take 1,950 mg by mouth daily.  Marland Kitchen aspirin EC 81 MG tablet Take 81 mg by mouth at bedtime.   . Bempedoic Acid-Ezetimibe (NEXLIZET) 180-10 MG TABS Take 1 tablet by mouth daily.  . benazepril (LOTENSIN) 20 MG tablet Take 1 tablet (20 mg total) by mouth 2 (two) times daily.  . Calcium Carbonate-Vitamin D (CALCIUM 600 + D PO) Take 1 tablet by mouth 2 (two) times daily.    . Cholecalciferol (VITAMIN D) 2000 units CAPS Take 2,000 Units by mouth daily.  Marland Kitchen docusate sodium (COLACE) 100 MG capsule Take 100 mg by mouth daily.   Marland Kitchen gabapentin (NEURONTIN) 600 MG tablet Take 600 mg by mouth 2 (two) times daily.  . Glucosamine-Chondroit-Vit C-Mn (GLUCOSAMINE CHONDR 1500 COMPLX PO) Take 2 tablets by mouth daily. 1500/1200  . icosapent Ethyl (VASCEPA) 1 g capsule Take 2 capsules (2 g total) by mouth 2 (two) times daily.  Marland Kitchen levothyroxine (SYNTHROID, LEVOTHROID) 75 MCG tablet Take 75 mcg by mouth daily before breakfast.   . Liniments (SALONPAS PAIN RELIEF PATCH EX) Place 1 patch onto the skin daily as needed (pain).  Marland Kitchen loratadine-pseudoephedrine (CLARITIN-D 24-HOUR) 10-240 MG per 24 hr tablet Take 1 tablet by mouth every evening.   . meclizine (ANTIVERT) 25 MG tablet Take 25 mg by mouth 3 (three) times daily as needed for dizziness.  . Melatonin 10 MG TABS Take 10  mg by mouth at bedtime.   . metoprolol tartrate (LOPRESSOR) 25 MG tablet Take 0.5 tablets (12.5 mg total) by mouth 2 (two) times daily.  . nitroGLYCERIN (NITROSTAT) 0.4 MG SL tablet Place 0.4 mg under the tongue every 5 (five) minutes as needed for chest pain.  . NON FORMULARY Place 1 drop under the tongue daily. CBD Oil  . omeprazole (PRILOSEC) 20 MG capsule Take 20 mg by mouth daily with breakfast.   . polyethylene glycol (MIRALAX / GLYCOLAX) packet Take 17 g by mouth daily as needed for mild constipation.   . potassium chloride (KLOR-CON) 10 MEQ tablet Take 1 tablet (10 mEq total) by mouth daily.  . rosuvastatin (CRESTOR) 40 MG tablet Take 1 tablet (40 mg total) by mouth at bedtime.     Allergies:   Pravastatin and Penicillins   Social History   Socioeconomic History  . Marital status: Married    Spouse name: Not on  file  . Number of children: 3  . Years of education: BSN+  . Highest education level: Not on file  Occupational History  . Occupation: Retired  Tobacco Use  . Smoking status: Former Smoker    Packs/day: 1.00    Years: 30.00    Pack years: 30.00    Types: Cigarettes    Start date: 68    Quit date: 08/13/1993    Years since quitting: 26.2  . Smokeless tobacco: Never Used  Vaping Use  . Vaping Use: Never used  Substance and Sexual Activity  . Alcohol use: Yes    Alcohol/week: 2.0 standard drinks    Types: 2 Glasses of wine per week    Comment: per day  . Drug use: No  . Sexual activity: Not Currently    Partners: Male    Comment: Married  Other Topics Concern  . Not on file  Social History Narrative   Lives at home w/ her husband and granddaughter   Right-handed   Caffeine: 3 cups of tea daily   Social Determinants of Health   Financial Resource Strain:   . Difficulty of Paying Living Expenses: Not on file  Food Insecurity:   . Worried About Charity fundraiser in the Last Year: Not on file  . Ran Out of Food in the Last Year: Not on file    Transportation Needs:   . Lack of Transportation (Medical): Not on file  . Lack of Transportation (Non-Medical): Not on file  Physical Activity:   . Days of Exercise per Week: Not on file  . Minutes of Exercise per Session: Not on file  Stress:   . Feeling of Stress : Not on file  Social Connections:   . Frequency of Communication with Friends and Family: Not on file  . Frequency of Social Gatherings with Friends and Family: Not on file  . Attends Religious Services: Not on file  . Active Member of Clubs or Organizations: Not on file  . Attends Archivist Meetings: Not on file  . Marital Status: Not on file     Family History: The patient's family history includes Aneurysm in her mother; Diabetes in her brother and sister; Heart disease in her brother and father; Hyperlipidemia in her brother and sister; Hypertension in her brother and sister. There is no history of Neuropathy, Urticaria, Allergic rhinitis, or Asthma.  ROS:   Please see the history of present illness.    Hand pain, 'generalized arthritis' -  All other systems reviewed and are negative.  EKGs/Labs/Other Studies Reviewed:    EKG:  EKG is not ordered today.    Recent Labs: 05/28/2019: BUN 30; Creatinine, Ser 1.00; Hemoglobin 12.9; Platelets 188; Potassium 4.8; Sodium 133 10/27/2019: ALT 20  Recent Lipid Panel    Component Value Date/Time   CHOL 146 10/27/2019 0813   TRIG 168 (H) 10/27/2019 0813   HDL 51 10/27/2019 0813   CHOLHDL 2.9 10/27/2019 0813   CHOLHDL 2.4 10/12/2015 0835   VLDL 28 10/12/2015 0835   LDLCALC 67 10/27/2019 0813     Risk Assessment/Calculations:       Physical Exam:    VS:  BP 102/60   Pulse 82   Ht 5' (1.524 m)   Wt 183 lb (83 kg)   SpO2 91%   BMI 35.74 kg/m     Wt Readings from Last 3 Encounters:  11/11/19 183 lb (83 kg)  08/11/19 184 lb (83.5 kg)  06/05/19 182 lb 6  oz (82.7 kg)     GEN:  Well nourished, well developed in no acute distress HEENT:  Normal NECK: No JVD; No carotid bruits LYMPHATICS: No lymphadenopathy CARDIAC: RRR, 2/6 SEM at the LUSB, early peaking RESPIRATORY:  Clear to auscultation without rales, wheezing or rhonchi  ABDOMEN: Soft, non-tender, non-distended MUSCULOSKELETAL:  No edema; No deformity  SKIN: Warm and dry NEUROLOGIC:  Alert and oriented x 3 PSYCHIATRIC:  Normal affect   ASSESSMENT:    1. Mixed hyperlipidemia   2. Coronary artery disease involving native coronary artery of native heart without angina pectoris   3. Essential hypertension    PLAN:    In order of problems listed above:  1. Patient working with the lipid clinic.  Treated with Nexlizet, Vascepa, and rosuvastatin.  Recent labs reviewed demonstrating a cholesterol 146, HDL 51, LDL 67, triglycerides 168.  ALT is 20.  Diet and lifestyle modification reviewed with patient. 2. No anginal symptoms at this time.  Continue aspirin, lipid-lowering therapy, and metoprolol. 3. Blood pressure well controlled on current therapy.  Most recent creatinine is reviewed at 1.0 mg/dL and potassium 4.8 mg/dL.   Medication Adjustments/Labs and Tests Ordered: Current medicines are reviewed at length with the patient today.  Concerns regarding medicines are outlined above.  No orders of the defined types were placed in this encounter.  No orders of the defined types were placed in this encounter.   There are no Patient Instructions on file for this visit.   Signed, Sherren Mocha, MD  11/11/2019 9:25 AM    [place]

## 2019-11-11 NOTE — Patient Instructions (Signed)

## 2019-12-17 ENCOUNTER — Other Ambulatory Visit: Payer: Self-pay

## 2019-12-17 ENCOUNTER — Ambulatory Visit: Payer: Medicare Other | Admitting: Podiatry

## 2019-12-17 DIAGNOSIS — M79674 Pain in right toe(s): Secondary | ICD-10-CM

## 2019-12-17 DIAGNOSIS — M7751 Other enthesopathy of right foot: Secondary | ICD-10-CM | POA: Diagnosis not present

## 2019-12-17 DIAGNOSIS — M2041 Other hammer toe(s) (acquired), right foot: Secondary | ICD-10-CM | POA: Diagnosis not present

## 2019-12-23 ENCOUNTER — Encounter: Payer: Self-pay | Admitting: Podiatry

## 2019-12-23 NOTE — Progress Notes (Signed)
Subjective:  Patient ID: Gabrielle [name], female    DOB: September 26, 1945,  MRN: [id number]  Chief Complaint  Patient presents with  . Toe Pain    pt states she has inflammation on her 5th toe right foot     74 y.o. female presents with the above complaint.  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  Patient states she gets 6 months of relief but has started coming back.  She denies any other acute complaints.  She would like to pursue another steroid injection as it did give her long-term relief.  She does not want [place] to discuss surgical options at this time.   Review of Systems: Negative except as noted in the HPI. Denies N/V/F/Ch.  Past Medical History:  Diagnosis Date  . Anemia   . Arthritis 01-17-11   degenerative disc disease-all joints  . Bursitis   . Cholecystitis with cholelithiasis 03/22/2018  . Cholesterol serum elevated 01-17-11   tx. meds  . GERD (gastroesophageal reflux disease) 01-17-11   tx. omeprazole  . Heart murmur    slight murmur  . High cholesterol   . Hypertension 01-17-11   tx. meds  . Hypothyroidism   . Leg pain    ABIs 2/18: normal bilaterally  . Neuropathy    FEET  . Osteoarthritis   . Peripheral neuropathy   . Pre-diabetes   . Pulmonary nodule   . Seasonal allergies   . Sinus problem 01-17-11   tx. Claritin D  . ST elevation (STEMI) myocardial infarction involving left circumflex coronary artery ([place]) 08/21/2015    Current Outpatient Medications:  .  acetaminophen (TYLENOL) 650 MG CR tablet, Take 1,950 mg by mouth daily., Disp: , Rfl:  .  aspirin EC 81 MG tablet, Take 81 mg by mouth at bedtime. , Disp: , Rfl:  .  Bempedoic Acid-Ezetimibe (NEXLIZET) 180-10 MG TABS, Take 1 tablet by mouth daily., Disp: 90 tablet, Rfl: 2 .  benazepril (LOTENSIN) 20 MG tablet, Take 1 tablet (20 mg total) by mouth 2 (two) times daily.,  Disp: 180 tablet, Rfl: 3 .  Calcium Carbonate-Vitamin D (CALCIUM 600 + D PO), Take 1 tablet by mouth 2 (two) times daily.  , Disp: , Rfl:  .  Cholecalciferol (VITAMIN D) 2000 units CAPS, Take 2,000 Units by mouth daily., Disp: , Rfl:  .  docusate sodium (COLACE) 100 MG capsule, Take 100 mg by mouth daily. , Disp: , Rfl:  .  gabapentin (NEURONTIN) 600 MG tablet, Take 600 mg by mouth 2 (two) times daily., Disp: , Rfl:  .  Glucosamine-Chondroit-Vit C-Mn (GLUCOSAMINE CHONDR 1500 COMPLX PO), Take 2 tablets by mouth daily. 1500/1200, Disp: , Rfl:  .  icosapent Ethyl (VASCEPA) 1 g capsule, Take 2 capsules (2 g total) by mouth 2 (two) times daily., Disp: 360 capsule, Rfl: 3 .  levothyroxine (SYNTHROID, LEVOTHROID) 75 MCG tablet, Take 75 mcg by mouth daily before breakfast. , Disp: , Rfl:  .  Liniments (SALONPAS PAIN RELIEF PATCH EX), Place 1 patch onto the skin daily as needed (pain)., Disp: , Rfl:  .  loratadine-pseudoephedrine (CLARITIN-D 24-HOUR) 10-240 MG per 24 hr tablet, Take 1 tablet by mouth every evening. , Disp: , Rfl:  .  meclizine (ANTIVERT) 25 MG tablet, Take 25 mg by mouth 3 (three) times daily as needed for dizziness., Disp: , Rfl:  .  Melatonin 10 MG TABS,  Take 10 mg by mouth at bedtime. , Disp: , Rfl:  .  metoprolol tartrate (LOPRESSOR) 25 MG tablet, Take 0.5 tablets (12.5 mg total) by mouth 2 (two) times daily., Disp: 90 tablet, Rfl: 3 .  nitroGLYCERIN (NITROSTAT) 0.4 MG SL tablet, Place 0.4 mg under the tongue every 5 (five) minutes as needed for chest pain., Disp: , Rfl:  .  NON FORMULARY, Place 1 drop under the tongue daily. CBD Oil, Disp: , Rfl:  .  omeprazole (PRILOSEC) 20 MG capsule, Take 20 mg by mouth daily with breakfast. , Disp: , Rfl:  .  polyethylene glycol (MIRALAX / GLYCOLAX) packet, Take 17 g by mouth daily as needed for mild constipation. , Disp: , Rfl:  .  potassium chloride (KLOR-CON) 10 MEQ tablet, Take 1 tablet (10 mEq total) by mouth daily., Disp: 90 tablet, Rfl: 3 .   rosuvastatin (CRESTOR) 40 MG tablet, Take 1 tablet (40 mg total) by mouth at bedtime., Disp: 90 tablet, Rfl: 3  Social History   Tobacco Use  Smoking Status Former Smoker  . Packs/day: 1.00  . Years: 30.00  . Pack years: 30.00  . Types: Cigarettes  . Start date: 54  . Quit date: 08/13/1993  . Years since quitting: 26.3  Smokeless Tobacco Never Used    Allergies  Allergen Reactions  . Pravastatin Other (See Comments)    Effects liver function  . Penicillins Other (See Comments)    Allergy as an infant - no other information available Has patient had a PCN reaction causing immediate rash, facial/tongue/throat swelling, SOB or lightheadedness with hypotension: Unknown Has patient had a PCN reaction causing severe rash involving mucus membranes or skin necrosis: Unknown Has patient had a PCN reaction that required hospitalization: No Has patient had a PCN reaction occurring within the last 10 years: No If all of the above answers are "NO", then may proceed with Cephalospor   Objective:  There were no vitals filed for this visit. There is no height or weight on file to calculate BMI. Constitutional Well developed. Well nourished.  Vascular Dorsalis pedis pulses palpable bilaterally. Posterior tibial pulses palpable bilaterally. Capillary refill normal to all digits.  No cyanosis or clubbing noted. Pedal hair growth normal.  Neurologic Normal speech. Oriented to person, place, and time. Epicritic sensation to light touch grossly present bilaterally.  Dermatologic Nails well groomed and normal in appearance. No open wounds. No skin lesions.  Orthopedic:  Right fifth digit pain on palpation to the lateral aspect of the digit at the hyperkeratotic lesion/corn.  Mild pain on the left fifth digit as well.  There is adductovarus rotation with a hammertoe contracture of the right fifth digit noted.  Semiflexible in nature.  No pain at the metatarsophalangeal joint.  No tailor's bunion  deformity noted.   Radiographs: None Assessment:   1. Acquired hammertoe of right foot   2. Pain in toe of right foot   3. Capsulitis of toe of right foot    Plan:  Patient was evaluated and treated and all questions answered.  Right fifth digit hammertoe contracture with adductovarus rotation/capsulitis -Clinically her pain has returned.  She states that she did really well with debridement injection however she she also made shoe gear modification however it seems like there is just too much deformity present to the fifth digit and her pain returned.  Patient is not ready to have surgery yet.  She would still like to proceed with conservative therapy with injection as it did give  her about 6 months of relief.  I feel comfortable proceeding with it given the length of time.  Since previous injection.  Patient agrees with the plan would like to proceed with a steroid injection -A steroid injection was performed at right fifth digit using 1% plain Lidocaine and 10 mg of Kenalog. This was well tolerated.    No follow-ups on file.

## 2019-12-30 NOTE — Progress Notes (Signed)
[facility]   Telephone:(336) [phone] Fax:(336) [phone]   Clinic Follow up Note   Patient Care Team: Hulan Fess, MD as PCP - General (Family Medicine) Sherren Mocha, MD as PCP - Cardiology (Cardiology)  Date of Service:  01/01/2020  CHIEF COMPLAINT: F/u of Allergy reaction   CURRENT THERAPY:  Claritin once daily   INTERVAL HISTORY:  Gabrielle [name] is here for a follow up. She was last seen by me 1 year ago. She presents to the clinic alone. She started having rashes in her upper chest about a week ago, with itchiness, no wheezing, dyspnea, fever or other new symptoms. She is taking Claritin once a day at night, and Benadryl twice daily with some improvement.  She said this is the first significant flare up since 2 years ago, she has been doing well otherwise, denies new symptoms  All other systems were reviewed with the patient and are negative.  MEDICAL HISTORY:  Past Medical History:  Diagnosis Date  . Anemia   . Arthritis 01-17-11   degenerative disc disease-all joints  . Bursitis   . Cholecystitis with cholelithiasis 03/22/2018  . Cholesterol serum elevated 01-17-11   tx. meds  . GERD (gastroesophageal reflux disease) 01-17-11   tx. omeprazole  . Heart murmur    slight murmur  . High cholesterol   . Hypertension 01-17-11   tx. meds  . Hypothyroidism   . Leg pain    ABIs 2/18: normal bilaterally  . Neuropathy    FEET  . Osteoarthritis   . Peripheral neuropathy   . Pre-diabetes   . Pulmonary nodule   . Seasonal allergies   . Sinus problem 01-17-11   tx. Claritin D  . ST elevation (STEMI) myocardial infarction involving left circumflex coronary artery ([place]) 08/21/2015    SURGICAL HISTORY: Past Surgical History:  Procedure Laterality Date  . ABDOMINAL HYSTERECTOMY  01-17-11   '93-Hysterectomy-heavy bleeding  . ANTERIOR LATERAL LUMBAR FUSION WITH PERCUTANEOUS SCREW 1 LEVEL N/A 03/07/2017   Procedure: XLIF L3-4;  Surgeon: Melina Schools, MD;   Location: [place];  Service: Orthopedics;  Laterality: N/A;  4 hrs  . APPENDECTOMY    . BACK SURGERY  01-17-11   04-20-10-T-Lift lumbar fusion  . [facility] PROCEDURE  01-17-11   Bladder sling  . CARDIAC CATHETERIZATION N/A 08/21/2015   Procedure: Left Heart Cath and Coronary Angiography;  Surgeon: Sherren Mocha, MD;  Location: [place] [facility];  Service: Cardiovascular;  Laterality: N/A;  . CARDIAC CATHETERIZATION N/A 08/21/2015   Procedure: Coronary Stent Intervention;  Surgeon: Sherren Mocha, MD;  Location: [place] [facility];  Service: Cardiovascular;  Laterality: N/A;  . CARDIAC CATHETERIZATION N/A 08/23/2015   Procedure: Coronary Stent Intervention;  Surgeon: Leonie Man, MD;  Location: [place] [facility];  Service: Cardiovascular;  Laterality: N/A;  promus 2.5x16 in RCA  . CARDIAC CATHETERIZATION N/A 08/23/2015   Procedure: Left Heart Cath and Coronary Angiography;  Surgeon: Leonie Man, MD;  Location: [place] [facility];  Service: Cardiovascular;  Laterality: N/A;  . CARPAL TUNNEL RELEASE  01-17-11   '02-left, also right  . CHOLECYSTECTOMY N/A 03/22/2018   Procedure: LAPAROSCOPIC CHOLECYSTECTOMY;  Surgeon: Fanny Skates, MD;  Location: [place];  Service: General;  Laterality: N/A;  . COLONOSCOPY    . HARDWARE REMOVAL Right 05/15/2013   Procedure: REMOVAL RIGHT L4-L5 PEDICLE SCREWS AND ROD ;  Surgeon: Melina Schools, MD;  Location: [place];  Service: Orthopedics;  Laterality: Right;  . JOINT REPLACEMENT  01-17-11    RTKA' 02  . LAPAROSCOPIC APPENDECTOMY N/A 08/13/2013   Procedure: APPENDECTOMY LAPAROSCOPIC;  Surgeon: Pedro Earls, MD;  Location: [facility];  Service: General;  Laterality: N/A;  . TONSILLECTOMY  01-17-11   child  . TOTAL KNEE ARTHROPLASTY  01/19/2011   Procedure: TOTAL KNEE ARTHROPLASTY;  Surgeon: Johnn Hai;  Location: [facility];  Service: Orthopedics;  Laterality: Left;  . TOTAL SHOULDER ARTHROPLASTY Right 06/05/2019   Procedure: TOTAL SHOULDER ARTHROPLASTY;  Surgeon:  Justice Britain, MD;  Location: [facility];  Service: Orthopedics;  Laterality: Right;  177min    I have reviewed the social history and family history with the patient and they are unchanged from previous note.  ALLERGIES:  is allergic to pravastatin and penicillins.  MEDICATIONS:  Current Outpatient Medications  Medication Sig Dispense Refill  . acetaminophen (TYLENOL) 650 MG CR tablet Take 1,950 mg by mouth daily.    Marland Kitchen aspirin EC 81 MG tablet Take 81 mg by mouth at bedtime.     . Bempedoic Acid-Ezetimibe (NEXLIZET) 180-10 MG TABS Take 1 tablet by mouth daily. 90 tablet 2  . benazepril (LOTENSIN) 20 MG tablet Take 1 tablet (20 mg total) by mouth 2 (two) times daily. 180 tablet 3  . Calcium Carbonate-Vitamin D (CALCIUM 600 + D PO) Take 1 tablet by mouth 2 (two) times daily.      . Cholecalciferol (VITAMIN D) 2000 units CAPS Take 2,000 Units by mouth daily.    Marland Kitchen docusate sodium (COLACE) 100 MG capsule Take 100 mg by mouth daily.     Marland Kitchen gabapentin (NEURONTIN) 600 MG tablet Take 600 mg by mouth 2 (two) times daily.    . Glucosamine-Chondroit-Vit C-Mn (GLUCOSAMINE CHONDR 1500 COMPLX PO) Take 2 tablets by mouth daily. 1500/1200    . icosapent Ethyl (VASCEPA) 1 g capsule Take 2 capsules (2 g total) by mouth 2 (two) times daily. 360 capsule 3  . levothyroxine (SYNTHROID, LEVOTHROID) 75 MCG tablet Take 75 mcg by mouth daily before breakfast.     . Liniments (SALONPAS PAIN RELIEF PATCH EX) Place 1 patch onto the skin daily as needed (pain).    Marland Kitchen loratadine-pseudoephedrine (CLARITIN-D 24-HOUR) 10-240 MG per 24 hr tablet Take 1 tablet by mouth every evening.     . meclizine (ANTIVERT) 25 MG tablet Take 25 mg by mouth 3 (three) times daily as needed for dizziness.    . Melatonin 10 MG TABS Take 10 mg by mouth at bedtime.     . metoprolol tartrate (LOPRESSOR) 25 MG tablet Take 0.5 tablets (12.5 mg total) by mouth 2 (two) times daily. 90 tablet 3  . nitroGLYCERIN (NITROSTAT) 0.4 MG SL tablet Place 0.4 mg under  the tongue every 5 (five) minutes as needed for chest pain.    . NON FORMULARY Place 1 drop under the tongue daily. CBD Oil    . omeprazole (PRILOSEC) 20 MG capsule Take 20 mg by mouth daily with breakfast.     . polyethylene glycol (MIRALAX / GLYCOLAX) packet Take 17 g by mouth daily as needed for mild constipation.     . potassium chloride (KLOR-CON) 10 MEQ tablet Take 1 tablet (10 mEq total) by mouth daily. 90 tablet 3  . rosuvastatin (CRESTOR) 40 MG tablet Take 1 tablet (40 mg total) by mouth at bedtime. 90 tablet 3   No current facility-administered medications for this visit.    PHYSICAL EXAMINATION: ECOG PERFORMANCE STATUS: 1 - Symptomatic but completely ambulatory  Vitals:   01/01/20 1105  BP: (!) 143/70  Pulse: 80  Resp: 17  Temp: 97.9 F (36.6 C)  SpO2: 100%   Filed Weights   01/01/20 1105  Weight: 184 lb 3.2 oz (83.6 kg)    GENERAL:alert, no distress and comfortable SKIN: skin color, texture, turgor are normal, no rashes or significant lesions, except scattered skin rashes in the upper front chest (see picture below), and slight skin redness in the upper back. EYES: normal, Conjunctiva are pink and non-injected, sclera clear NECK: supple, thyroid normal size, non-tender, without nodularity LYMPH:  no palpable lymphadenopathy in the cervical, axillary  LUNGS: clear to auscultation and percussion with normal breathing effort HEART: regular rate & rhythm and no murmurs and no lower extremity edema ABDOMEN:abdomen soft, non-tender and normal bowel sounds Musculoskeletal:no cyanosis of digits and no clubbing  NEURO: alert & oriented x 3 with fluent speech, no focal motor/sensory deficits    LABORATORY DATA:  I have reviewed the data as listed CBC Latest Ref Rng & Units 01/01/2020 05/28/2019 01/29/2019  WBC 4.0 - 10.5 K/uL 6.8 6.8 6.7  Hemoglobin 12.0 - 15.0 g/dL 12.3 12.9 12.7  Hematocrit 36 - 46 % 38.0 40.2 38.5  Platelets 150 - 400 K/uL 211 188 192     CMP  Latest Ref Rng & Units 10/27/2019 05/28/2019 01/29/2019  Glucose 70 - 99 mg/dL - 123(H) 106(H)  BUN 8 - 23 mg/dL - 30(H) 24(H)  Creatinine 0.44 - 1.00 mg/dL - 1.00 1.13(H)  Sodium 135 - 145 mmol/L - 133(L) 133(L)  Potassium 3.5 - 5.1 mmol/L - 4.8 4.5  Chloride 98 - 111 mmol/L - 99 96(L)  CO2 22 - 32 mmol/L - 26 27  Calcium 8.9 - 10.3 mg/dL - 9.5 9.5  Total Protein 6.0 - 8.5 g/dL 7.0 - 7.0  Total Bilirubin 0.0 - 1.2 mg/dL 0.3 - 0.4  Alkaline Phos 44 - 121 IU/L 89 - 93  AST 0 - 40 IU/L 23 - 27  ALT 0 - 32 IU/L 20 - 47(H)      RADIOGRAPHIC STUDIES: I have personally reviewed the radiological images as listed and agreed with the findings in the report. No results found.   ASSESSMENT & PLAN:  Gabrielle [name] is a 74 y.o. female with    1. 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 Hives resolved after Allegra -She was seen by allergist. She had abnormal Tryptase in 20's. Other labs normal.  -She was seen by Dr. Walden Field who completed multiple workup test and scan all which were negative, except mildly elevated Tryptase. -she has no abnormal CBC, CT scan showed moderate steatosis, normal spleen size and appearance. -No definitive evidence of systemic mastocytosis, I do not think mildly elevated tryptase is a concern, usually tryptase is much higher in mastocytosis. -She is clinically doing well, she is having a skin rash flare-up this week, but overall tolerable.  She is on Benadryl and Claritin, I recommend her to add famotidine twice daily -She will continue follow-up Dr. Ernst Bowler, see me as needed    2. Flushing/Hot flashes, Diarrhea/constipation, cramping  -She has been having hot flashes occasionally and solely at night since her menopause at age 66.  -she also has constipation, along with abdominal cramps.   -Her Chr A was from 07/2018 was 564.  CT scan was negative for malignancy.  Her 01/30/19 DOTATATE PET was  negative. Neuroendocrine is ruled out  -Per pt her 2019 colonoscopy and endoscopy was normal.  -  I also discussed continuing to f/u with GI regularly to rule out IBS.   3.  Liver steatosis, RUQ pain   -Pt reports daily alcohol use.  Pt has US abdomen done 03/11/2018 that showed fatty changes or findings that may be consistent with HC disease.  Pt has negative hepatitis panel other than + Hb B S AB which is likely due to immunization.   4. Pulmonary nodule -CT CAP from 08/15/18 shows indetermine left upper lobe pulmonary nodules measuring up to 9 mm -Pt has smoking history.  She was evaluated by pulmonary and will f/u with then.   -she is scheduled for CT chest on 01/28/2020   5. Joint pain/arthritis, HTN -Continue Follow-up with PCP   PLAN:  -f/u with me as needed    No problem-specific Assessment & Plan notes found for this encounter.   No orders of the defined types were placed in this encounter.  All questions were answered. The patient knows to call the clinic with any problems, questions or concerns. No barriers to learning was detected. The total time spent in the appointment was 25 minutes.     Truitt Merle, MD 01/01/2020   I, Joslyn Devon, am acting as scribe for Truitt Merle, MD.   I have reviewed the above documentation for accuracy and completeness, and I agree with the above.

## 2020-01-01 ENCOUNTER — Inpatient Hospital Stay: Payer: Medicare Other | Admitting: Hematology

## 2020-01-01 ENCOUNTER — Encounter: Payer: Self-pay | Admitting: Hematology

## 2020-01-01 ENCOUNTER — Other Ambulatory Visit: Payer: Self-pay

## 2020-01-01 ENCOUNTER — Inpatient Hospital Stay: Payer: Medicare Other | Attending: Hematology

## 2020-01-01 DIAGNOSIS — I10 Essential (primary) hypertension: Secondary | ICD-10-CM

## 2020-01-01 DIAGNOSIS — E785 Hyperlipidemia, unspecified: Secondary | ICD-10-CM | POA: Diagnosis not present

## 2020-01-01 DIAGNOSIS — R197 Diarrhea, unspecified: Secondary | ICD-10-CM | POA: Insufficient documentation

## 2020-01-01 DIAGNOSIS — T7840XD Allergy, unspecified, subsequent encounter: Secondary | ICD-10-CM | POA: Diagnosis not present

## 2020-01-01 DIAGNOSIS — M2550 Pain in unspecified joint: Secondary | ICD-10-CM | POA: Diagnosis not present

## 2020-01-01 DIAGNOSIS — L299 Pruritus, unspecified: Secondary | ICD-10-CM

## 2020-01-01 DIAGNOSIS — R1011 Right upper quadrant pain: Secondary | ICD-10-CM

## 2020-01-01 DIAGNOSIS — R232 Flushing: Secondary | ICD-10-CM | POA: Diagnosis not present

## 2020-01-01 DIAGNOSIS — T7840XA Allergy, unspecified, initial encounter: Secondary | ICD-10-CM | POA: Diagnosis present

## 2020-01-01 DIAGNOSIS — R911 Solitary pulmonary nodule: Secondary | ICD-10-CM | POA: Diagnosis not present

## 2020-01-01 DIAGNOSIS — K760 Fatty (change of) liver, not elsewhere classified: Secondary | ICD-10-CM | POA: Insufficient documentation

## 2020-01-01 DIAGNOSIS — L509 Urticaria, unspecified: Secondary | ICD-10-CM

## 2020-01-01 DIAGNOSIS — M1990 Unspecified osteoarthritis, unspecified site: Secondary | ICD-10-CM

## 2020-01-01 DIAGNOSIS — Z79899 Other long term (current) drug therapy: Secondary | ICD-10-CM | POA: Diagnosis not present

## 2020-01-01 LAB — CMP (CANCER CENTER ONLY)
ALT: 23 U/L (ref 0–44)
AST: 24 U/L (ref 15–41)
Albumin: 4 g/dL (ref 3.5–5.0)
Alkaline Phosphatase: 86 U/L (ref 38–126)
Anion gap: 10 (ref 5–15)
BUN: 35 mg/dL — ABNORMAL HIGH (ref 8–23)
CO2: 26 mmol/L (ref 22–32)
Calcium: 10 mg/dL (ref 8.9–10.3)
Chloride: 102 mmol/L (ref 98–111)
Creatinine: 1.18 mg/dL — ABNORMAL HIGH (ref 0.44–1.00)
GFR, Estimated: 48 mL/min — ABNORMAL LOW (ref >60)
Glucose, Bld: 123 mg/dL — ABNORMAL HIGH (ref 70–99)
Potassium: 5.1 mmol/L (ref 3.5–5.1)
Sodium: 138 mmol/L (ref 135–145)
Total Bilirubin: 0.3 mg/dL (ref 0.3–1.2)
Total Protein: 7.2 g/dL (ref 6.5–8.1)

## 2020-01-01 LAB — CBC WITH DIFFERENTIAL (CANCER CENTER ONLY)
Abs Immature Granulocytes: 0.03 10*3/uL (ref 0.00–0.07)
Basophils Absolute: 0.1 10*3/uL (ref 0.0–0.1)
Basophils Relative: 1 %
Eosinophils Absolute: 0.6 10*3/uL — ABNORMAL HIGH (ref 0.0–0.5)
Eosinophils Relative: 8 %
HCT: 38 % (ref 36.0–46.0)
Hemoglobin: 12.3 g/dL (ref 12.0–15.0)
Immature Granulocytes: 0 %
Lymphocytes Relative: 24 %
Lymphs Abs: 1.6 10*3/uL (ref 0.7–4.0)
MCH: 30.7 pg (ref 26.0–34.0)
MCHC: 32.4 g/dL (ref 30.0–36.0)
MCV: 94.8 fL (ref 80.0–100.0)
Monocytes Absolute: 0.5 10*3/uL (ref 0.1–1.0)
Monocytes Relative: 7 %
Neutro Abs: 4.1 10*3/uL (ref 1.7–7.7)
Neutrophils Relative %: 60 %
Platelet Count: 211 10*3/uL (ref 150–400)
RBC: 4.01 MIL/uL (ref 3.87–5.11)
RDW: 13.2 % (ref 11.5–15.5)
WBC Count: 6.8 10*3/uL (ref 4.0–10.5)
nRBC: 0 % (ref 0.0–0.2)

## 2020-01-02 ENCOUNTER — Ambulatory Visit: Payer: Medicare Other | Admitting: Hematology

## 2020-01-02 ENCOUNTER — Other Ambulatory Visit: Payer: Medicare Other

## 2020-01-02 LAB — CHROMOGRANIN A: Chromogranin A (ng/mL): 539 ng/mL — ABNORMAL HIGH (ref 0.0–101.8)

## 2020-01-11 ENCOUNTER — Other Ambulatory Visit: Payer: Self-pay | Admitting: Physician Assistant

## 2020-01-20 ENCOUNTER — Encounter: Payer: Self-pay | Admitting: Allergy & Immunology

## 2020-01-20 ENCOUNTER — Ambulatory Visit: Payer: Medicare Other | Admitting: Allergy & Immunology

## 2020-01-20 ENCOUNTER — Other Ambulatory Visit: Payer: Self-pay

## 2020-01-20 VITALS — BP 132/80 | HR 68 | Temp 97.8°F | Resp 18 | Ht 60.0 in | Wt 185.0 lb

## 2020-01-20 DIAGNOSIS — T63481D Toxic effect of venom of other arthropod, accidental (unintentional), subsequent encounter: Secondary | ICD-10-CM

## 2020-01-20 DIAGNOSIS — L508 Other urticaria: Secondary | ICD-10-CM

## 2020-01-20 NOTE — Progress Notes (Signed)
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 I did recommend that she restart the suppressive antihistamine dosing and see how she does with that. We did discuss starting prednisone but she wanted to hold off for now. We did provide her with a prednisone pack to start if the symptoms worsened at all. I think we can be more aggressive if the urticaria continue, but with her hives only coming up once a year or so, I think we can hold off.  Plan/Recommendations:   1. Chronic urticaria - Continue to follow with Pulmonology and Oncology.  - Continue with Claritin-D daily as you are doing. - Let's add on the suppressive antihistamines for two weeks:   - Morning: Allegra (fexofenadine) 360mg  (two tablets)   - Evening: Zyrtec (cetirizine) 20mg  (two tablets) - Prednisone pack provided to have on hand just in case.   2. Return in about 3 months (around 04/19/2020).   Subjective:   Gabrielle [name] is a 74 y.o. female presenting today for follow up of  Chief Complaint  Patient presents with  . Urticaria    Gabrielle [name] has a history of the following: Patient Active Problem List   Diagnosis Date Noted  . Allergy 01/01/2020  . Status post total shoulder arthroplasty, right 06/05/2019  . Lung nodules 11/18/2018  . Pulmonary nodule less than 1 cm in diameter with moderate to high risk for malignant neoplasm 09/16/2018  . Cholecystitis with cholelithiasis 03/22/2018  . S/P lumbar fusion 03/07/2017  . Chronic low back pain 02/26/2017  . Hematoma of arm 08/24/2015  . Coronary artery disease involving native  coronary artery of native heart without angina pectoris   . Dyslipidemia 08/22/2015  . Heart murmur   . History of ST elevation myocardial infarction (STEMI) 08/21/2015  . Appendicitis, acute 08/14/2013  . S/P laparoscopic appendectomy 08/13/2013  . Neuropathy 11/11/2012  . Hypertension 01/17/2011  . Osteoarthritis 01/30/1998    History obtained from: chart review and patient.  Gabrielle [name] is a 74 y.o. female presenting for a follow up visit.  She was last seen by me in August 2020.  At that time, she had experienced an work-up with hematology and oncology which was normal.  We continue with Claritin-D.  With the emergence of hives, we recommended using Allegra 1 to 2 tablets in the morning and Zyrtec 1 to 2 tablets at night.  We asked her to follow-up in 6 months, but she presents today well over a year later.  She started having issues again the weekend after Thanksgiving. She saw her PCP a week later for a regular checkup. She thinks that she was feeling better. She is unsure what she was exposed to at the time. It started out of the blue and nothing has changed at all. She denies any new exposures whatsoever.   Overall it is lasting longer, but the welts are smaller. She did add on Benadryl since the new rash started. She has been using it only when "exceptionally bad". She did try Pepcid but she never felt that it helped much at all.   As part of her workup for her  elevated tryptase, she would to see hematology oncology.  A PET scan noted lung nodules, which is being followed by pulmonology now.  She has seen Dr. Everardo All, but she saw [name] over the summer. She has an appointment in January 2022 with Dr. Everardo All.   Otherwise, there have been no changes to her past medical history, surgical history, family history, or social history.    Review of Systems  Constitutional: Negative.  Negative for chills, fever, malaise/fatigue and weight loss.  HENT: Positive for congestion and  sinus pain. Negative for ear discharge and ear pain.   Eyes: Negative for pain, discharge and redness.  Respiratory: Negative for cough, sputum production, shortness of breath and wheezing.   Cardiovascular: Negative.  Negative for chest pain and palpitations.  Gastrointestinal: Negative for abdominal pain, constipation, diarrhea, heartburn, nausea and vomiting.  Skin: Positive for itching and rash.  Neurological: Negative for dizziness and headaches.  Endo/Heme/Allergies: Positive for environmental allergies. Does not bruise/bleed easily.       Objective:   Blood pressure 132/80, pulse 68, temperature 97.8 F (36.6 C), temperature source Temporal, resp. rate 18, height 5' (1.524 m), weight 185 lb (83.9 kg), SpO2 98 %. Body mass index is 36.13 kg/m.   Physical Exam:  Physical Exam Constitutional:      Appearance: She is well-developed.  HENT:     Head: Normocephalic and atraumatic.     Right Ear: Tympanic membrane, ear canal and external ear normal.     Left Ear: Tympanic membrane and ear canal normal.     Nose: No nasal deformity, septal deviation, mucosal edema, rhinorrhea or epistaxis.     Right Sinus: No maxillary sinus tenderness or frontal sinus tenderness.     Left Sinus: No maxillary sinus tenderness or frontal sinus tenderness.     Mouth/Throat:     Mouth: Oropharynx is clear and moist. Mucous membranes are not pale and not dry.     Pharynx: Uvula midline.  Eyes:     General:        Right eye: No discharge.        Left eye: No discharge.     Extraocular Movements: EOM normal.     Conjunctiva/sclera: Conjunctivae normal.     Right eye: Right conjunctiva is not injected. No chemosis.    Left eye: Left conjunctiva is not injected. No chemosis.    Pupils: Pupils are equal, round, and reactive to light.  Cardiovascular:     Rate and Rhythm: Normal rate and regular rhythm.     Heart sounds: Normal heart sounds.  Pulmonary:     Effort: Pulmonary effort is normal. No  tachypnea, accessory muscle usage or respiratory distress.     Breath sounds: Normal breath sounds. No wheezing, rhonchi or rales.  Chest:     Chest wall: No tenderness.  Lymphadenopathy:     Cervical: No cervical adenopathy.  Skin:    Coloration: Skin is not pale.     Findings: No abrasion, erythema, petechiae or rash. Rash is not papular, urticarial or vesicular.     Comments: She does have some urticaria in various stages of resolution. There are excoriation marks present.   Neurological:     Mental Status: She is alert.  Psychiatric:        Mood and Affect: Mood and affect normal.      Diagnostic studies: none      Malachi Bonds, MD  [facility]

## 2020-01-20 NOTE — Patient Instructions (Addendum)
1. Chronic urticaria - Continue to follow with Pulmonology and Oncology.  - Continue with Claritin-D daily as you are doing. - Let's add on the suppressive antihistamines for two weeks:   - Morning: Allegra (fexofenadine) 360mg  (two tablets)   - Evening: Zyrtec (cetirizine) 20mg  (two tablets) - Prednisone pack provided to have on hand just in case.   2. Return in about 3 months (around 04/19/2020).    Please inform us of any Emergency Department visits, hospitalizations, or changes in symptoms. Call us before going to the ED for breathing or allergy symptoms since we might be able to fit you in for a sick visit. Feel free to contact us anytime with any questions, problems, or concerns.  It was a pleasure to see you again today!  Websites that have reliable patient information: 1. American Academy of Asthma, Allergy, and Immunology: www.aaaai.org 2. Food Allergy Research and Education (FARE): foodallergy.org 3. Mothers of Asthmatics: http://www.asthmacommunitynetwork.org 4. American College of Allergy, Asthma, and Immunology: www.acaai.org  "Like" [place] on Facebook and Instagram for our latest updates!      Make sure you are registered to vote! If you have moved or changed any of your contact information, you will need to get this updated before voting!  In some cases, you MAY be able to register to vote online: [email or web address]    Voter ID laws are NOT going into effect for the General Election in November 2020! DO NOT let this stop you from exercising your right to vote!   Absentee voting is the SAFEST way to vote during the coronavirus pandemic!   Download and print an absentee ballot request form at [email or web address] or you can scan the QR code below with your smart phone:      More information on absentee ballots can be found here: [email or web address]

## 2020-01-22 ENCOUNTER — Encounter: Payer: Self-pay | Admitting: Allergy & Immunology

## 2020-01-28 ENCOUNTER — Other Ambulatory Visit: Payer: Self-pay

## 2020-01-28 ENCOUNTER — Ambulatory Visit (INDEPENDENT_AMBULATORY_CARE_PROVIDER_SITE_OTHER)
Admission: RE | Admit: 2020-01-28 | Discharge: 2020-01-28 | Disposition: A | Discharge status: Home / Self Care | Payer: Medicare Other | Source: Ambulatory Visit | Attending: Primary Care | Admitting: Primary Care

## 2020-01-28 DIAGNOSIS — R911 Solitary pulmonary nodule: Secondary | ICD-10-CM | POA: Diagnosis not present

## 2020-01-29 NOTE — Progress Notes (Signed)
Please let patient know CT showed stable pulmonary nodule and stable coronary artery disease. Needs repeat CT WO contrast in 1 year.   She has follow-up in January with Dr. Everardo All, can review in further detail at that visit.

## 2020-02-02 ENCOUNTER — Telehealth: Payer: Self-pay | Admitting: *Deleted

## 2020-02-02 NOTE — Telephone Encounter (Signed)
   Primary Cardiologist: Tonny Bollman, MD  Chart reviewed as part of pre-operative protocol coverage. Patient was contacted 02/02/2020 in reference to pre-operative risk assessment for pending surgery as outlined below.  Gabrielle [name] was last seen on 11/11/2019 by Dr. Excell Seltzer.  Since that day, Gabrielle [name] has done well from a cardiac standpoint. She continues to ride a recumbent bike, go for bike rides at [employer], and do yoga. She has no anginal complaints and can easily complete 4 METs.   Therefore, based on ACC/AHA guidelines, the patient would be at acceptable risk for the planned procedure without further cardiovascular testing.   The patient was advised that if she develops new symptoms prior to surgery to contact our office to arrange for a follow-up visit, and she verbalized understanding.  Per previous recommendations and given lack of interval change in cardiac history, patient can hold aspirin 5-7 days prior to her upcoming surgery with plans to restart as soon as she is cleared to do so by Dr. Amanda Pea.  I will route this recommendation to the requesting party via Epic fax function and remove from pre-op pool. Please call with questions.  Beatriz Stallion, PA-C 02/02/2020, 12:58 PM

## 2020-02-02 NOTE — Telephone Encounter (Signed)
   [facility] Pre-operative Risk Assessment    HEARTCARE STAFF: - Please ensure there is not already an duplicate clearance open for this procedure. - Under Visit Info/Reason for Call, type in Other and utilize the format Clearance MM/DD/YY or Clearance TBD. Do not use dashes or single digits. - If request is for dental extraction, please clarify the # of teeth to be extracted.  Request for surgical clearance:  1. What type of surgery is being performed? RIGHT THUMB CMC ARTHROPLASTY   2. When is this surgery scheduled? 03/02/20   3. What type of clearance is required (medical clearance vs. Pharmacy clearance to hold med vs. Both)? MEDICAL  4. Are there any medications that need to be held prior to surgery and how long? ASA    5. Practice name and name of physician performing surgery? [facility]; DR. Gwyndolyn Saxon [facility]   6. What is the office phone number? [phone]   7.   What is the office fax number? [place]  8.   Anesthesia type (None, local, MAC, general) ? MAC   Julaine Hua 02/02/2020, 11:47 AM  _________________________________________________________________   (provider comments below)

## 2020-02-09 ENCOUNTER — Ambulatory Visit: Payer: Medicare Other | Admitting: Cardiovascular Disease

## 2020-02-10 ENCOUNTER — Ambulatory Visit: Payer: Medicare Other | Admitting: Pulmonary Disease

## 2020-02-11 DIAGNOSIS — E039 Hypothyroidism, unspecified: Secondary | ICD-10-CM | POA: Diagnosis not present

## 2020-02-11 DIAGNOSIS — M8580 Other specified disorders of bone density and structure, unspecified site: Secondary | ICD-10-CM | POA: Diagnosis not present

## 2020-02-11 DIAGNOSIS — D509 Iron deficiency anemia, unspecified: Secondary | ICD-10-CM | POA: Diagnosis not present

## 2020-02-11 DIAGNOSIS — K219 Gastro-esophageal reflux disease without esophagitis: Secondary | ICD-10-CM | POA: Diagnosis not present

## 2020-02-11 DIAGNOSIS — R944 Abnormal results of kidney function studies: Secondary | ICD-10-CM | POA: Diagnosis not present

## 2020-02-11 DIAGNOSIS — I2510 Atherosclerotic heart disease of native coronary artery without angina pectoris: Secondary | ICD-10-CM | POA: Diagnosis not present

## 2020-02-11 DIAGNOSIS — N189 Chronic kidney disease, unspecified: Secondary | ICD-10-CM | POA: Diagnosis not present

## 2020-02-11 DIAGNOSIS — I252 Old myocardial infarction: Secondary | ICD-10-CM | POA: Diagnosis not present

## 2020-02-11 DIAGNOSIS — I10 Essential (primary) hypertension: Secondary | ICD-10-CM | POA: Diagnosis not present

## 2020-02-11 DIAGNOSIS — E782 Mixed hyperlipidemia: Secondary | ICD-10-CM | POA: Diagnosis not present

## 2020-02-11 DIAGNOSIS — M47816 Spondylosis without myelopathy or radiculopathy, lumbar region: Secondary | ICD-10-CM | POA: Diagnosis not present

## 2020-02-25 ENCOUNTER — Ambulatory Visit: Payer: Medicare Other | Admitting: Pulmonary Disease

## 2020-02-25 ENCOUNTER — Encounter: Payer: Self-pay | Admitting: Pulmonary Disease

## 2020-02-25 ENCOUNTER — Other Ambulatory Visit: Payer: Self-pay

## 2020-02-25 VITALS — BP 118/72 | HR 78 | Temp 97.4°F | Ht 60.0 in | Wt 188.8 lb

## 2020-02-25 DIAGNOSIS — R918 Other nonspecific abnormal finding of lung field: Secondary | ICD-10-CM | POA: Diagnosis not present

## 2020-02-25 NOTE — Progress Notes (Signed)
Subjective:   PATIENT ID: Gabrielle [name] GENDER: female DOB: 1946/01/10, MRN: [id number]   HPI  Chief Complaint  Patient presents with  . Follow-up    Review of CT     Reason for Visit: Follow-up for pulmonary nodule  Ms. Gabrielle [name] is a 75 year old female smoker with multiple pulmonary nodules who presents for CT follow-up.year old female former smoker who presents for CT Chest follow-up.  Since our last visit, she denies any respiratory issues. No significant shortness of breath, cough, wheezing or sputum production. Denies chest pain. Denies unexplained fevers, chills, unintentional weight loss. She has been seen in Oncology in December for follow-up on 01/01/20 with Dr. Burr Medico. Note was reviewed and she has had negative work-up for her hives. DOTATATE PET in 12/2018 was negative for neuroendocrine tumors at the time.  Social History: Quit in 1985. 20 pack years  Environmental exposures: None  I have personally reviewed patient's past medical/family/social history/allergies/current medications.  Past Medical History:  Diagnosis Date  . Anemia   . Arthritis 01-17-11   degenerative disc disease-all joints  . Bursitis   . Cholecystitis with cholelithiasis 03/22/2018  . Cholesterol serum elevated 01-17-11   tx. meds  . GERD (gastroesophageal reflux disease) 01-17-11   tx. omeprazole  . Heart murmur    slight murmur  . High cholesterol   . Hypertension 01-17-11   tx. meds  . Hypothyroidism   . Leg pain    ABIs 2/18: normal bilaterally  . Neuropathy    FEET  . Osteoarthritis   . Peripheral neuropathy   . Pre-diabetes   . Pulmonary nodule   . Seasonal allergies   . Sinus problem 01-17-11   tx. Claritin D  . ST elevation (STEMI) myocardial infarction involving left circumflex coronary artery ([place]) 08/21/2015     Allergies  Allergen Reactions  . Pravastatin Other (See Comments)    Effects liver function  . Penicillins Other (See Comments)    Allergy as an  infant - no other information available Has patient had a PCN reaction causing immediate rash, facial/tongue/throat swelling, SOB or lightheadedness with hypotension: Unknown Has patient had a PCN reaction causing severe rash involving mucus membranes or skin necrosis: Unknown Has patient had a PCN reaction that required hospitalization: No Has patient had a PCN reaction occurring within the last 10 years: No If all of the above answers are "NO", then may proceed with Cephalospor     Outpatient Medications Prior to Visit  Medication Sig Dispense Refill  . acetaminophen (TYLENOL) 650 MG CR tablet Take 1,950 mg by mouth daily.    Marland Kitchen aspirin EC 81 MG tablet Take 81 mg by mouth at bedtime.     . Bempedoic Acid-Ezetimibe (NEXLIZET) 180-10 MG TABS Take 1 tablet by mouth daily. 90 tablet 2  . benazepril (LOTENSIN) 20 MG tablet Take 1 tablet (20 mg total) by mouth 2 (two) times daily. 180 tablet 3  . Calcium Carbonate-Vitamin D (CALCIUM 600 + D PO) Take 1 tablet by mouth 2 (two) times daily.    . Cholecalciferol (VITAMIN D) 2000 units CAPS Take 2,000 Units by mouth daily.    Marland Kitchen docusate sodium (COLACE) 100 MG capsule Take 100 mg by mouth daily.    Marland Kitchen gabapentin (NEURONTIN) 600 MG tablet Take 600 mg by mouth 2 (two) times daily.    . Glucosamine-Chondroit-Vit C-Mn (GLUCOSAMINE CHONDR 1500 COMPLX PO) Take 2 tablets by mouth daily. 1500/1200    . icosapent Ethyl (VASCEPA) 1  g capsule Take 2 capsules (2 g total) by mouth 2 (two) times daily. 360 capsule 3  . levothyroxine (SYNTHROID, LEVOTHROID) 75 MCG tablet Take 75 mcg by mouth daily before breakfast.    . loratadine-pseudoephedrine (CLARITIN-D 24-HOUR) 10-240 MG per 24 hr tablet Take 1 tablet by mouth every evening.    . meclizine (ANTIVERT) 25 MG tablet Take 25 mg by mouth 3 (three) times daily as needed for dizziness.    . Melatonin 10 MG TABS Take 10 mg by mouth at bedtime.     . metoprolol tartrate (LOPRESSOR) 25 MG tablet Take 0.5 tablets (12.5 mg  total) by mouth 2 (two) times daily. 90 tablet 3  . nitroGLYCERIN (NITROSTAT) 0.4 MG SL tablet Place 0.4 mg under the tongue every 5 (five) minutes as needed for chest pain.    . NON FORMULARY Place 1 drop under the tongue daily. CBD Oil    . omeprazole (PRILOSEC) 20 MG capsule Take 20 mg by mouth daily with breakfast.    . polyethylene glycol (MIRALAX / GLYCOLAX) packet Take 17 g by mouth daily as needed for mild constipation.     . potassium chloride (KLOR-CON) 10 MEQ tablet Take 1 tablet (10 mEq total) by mouth daily. 90 tablet 3  . potassium chloride (KLOR-CON) 10 MEQ tablet Take 10 mEq by mouth daily.    . rosuvastatin (CRESTOR) 40 MG tablet TAKE 1 TABLET BY MOUTH EVERYDAY AT BEDTIME 90 tablet 3  . triamterene-hydrochlorothiazide (MAXZIDE-25) 37.5-25 MG tablet 1 tablet in the morning     No facility-administered medications prior to visit.    Review of Systems  Constitutional: Negative for chills, diaphoresis, fever, malaise/fatigue and weight loss.  HENT: Negative for congestion.   Respiratory: Negative for cough, hemoptysis, sputum production, shortness of breath and wheezing.   Cardiovascular: Negative for chest pain, palpitations and leg swelling.     Objective:   Vitals:   02/25/20 1157  BP: 118/72  Pulse: 78  Temp: (!) 97.4 F (36.3 C)  SpO2: 99%  Weight: 188 lb 12.8 oz (85.6 kg)  Height: 5' (1.524 m)   SpO2: 99 % O2 Device: None (Room air)  Physical Exam: General: Well-appearing, no acute distress HENT: [place], AT Eyes: EOMI, no scleral icterus Respiratory: Clear to auscultation bilaterally.  No crackles, wheezing or rales Cardiovascular: RRR, -M/R/G, no JVD Neuro: AAO x4, CNII-XII grossly intact Psych: Normal mood, normal affect  Data Reviewed:  Imaging: CT CAP 08/15/18 - 3mm pulmonary nodule cluster in the LLL abutting the fissure. No mediastinal or hilar adenopathy noted. PET DOTATATE 01/30/20 - No evidence of well differentiated neuroendocrine tumor CT  Chest 01/28/20 - Small ground glass nodules in LUL with largest measuring 53mm. No new lesions  PFT: None on file  Labs: Tryptase 18.1 (UL 13.2) Chromagranin A 563.6 (UL 101.8) 24-hour catecholamine-within normal limits 24-hour normetanephrine-902 (UL 612)  CBC    Component Value Date/Time   WBC 6.8 01/01/2020 1049   WBC 6.8 05/28/2019 1012   RBC 4.01 01/01/2020 1049   HGB 12.3 01/01/2020 1049   HCT 38.0 01/01/2020 1049   PLT 211 01/01/2020 1049   MCV 94.8 01/01/2020 1049   MCH 30.7 01/01/2020 1049   MCHC 32.4 01/01/2020 1049   RDW 13.2 01/01/2020 1049   LYMPHSABS 1.6 01/01/2020 1049   MONOABS 0.5 01/01/2020 1049   EOSABS 0.6 (H) 01/01/2020 1049   BASOSABS 0.1 01/01/2020 1049   BMET    Component Value Date/Time   NA 138 01/01/2020 1049  K 5.1 01/01/2020 1049   CL 102 01/01/2020 1049   CO2 26 01/01/2020 1049   GLUCOSE 123 (H) 01/01/2020 1049   BUN 35 (H) 01/01/2020 1049   CREATININE 1.18 (H) 01/01/2020 1049   CALCIUM 10.0 01/01/2020 1049   GFRNONAA 48 (L) 01/01/2020 1049   GFRAA >60 05/28/2019 1012   GFRAA 56 (L) 01/29/2019 0952   Imaging, labs and test noted above have been reviewed independently by me.     Assessment & Plan:   Discussion:  75 year old female with history of ground glass lung nodules/opacities who presents for follow-up.  She was previously worked up for possible carcinoid tumor for elevated tryptase in chromogranin a levels. Carcinoid in the lung more commonly presents as endobronchial lesions but can present as peripheral lesions.  CT imaging had been reviewed and there are no symptoms or secondary findings to suggest endobronchial lesion (hemoptysis, wheezing, chronic cough, atelectasis, lung collapse.).  No indication for bronchoscopy in the absence of carcinoid symptoms.  Ground glass nodules have been stable since 07/2018 with no new lung lesions. We reviewed CT imaging in clinic and measured nodules which are unchanged since her last CT.  PET DOTATATE neg in 12/2018. Remains asymptomatic.  Multiple lung nodules, ground glass  --Plan for CT Chest in 07/2020 to complete two years of nodule surveillance --No indication for bronchoscopy  Orders Placed This Encounter  Procedures  . CT Chest Wo Contrast    Standing Status:   Future    Standing Expiration Date:   02/24/2021    Scheduling Instructions:     Please schedule in July 2022. Pt will need f/u scheduled with JE to review results of this scan    Order Specific Question:   Preferred imaging location?    Answer:   [place]  No orders of the defined types were placed in this encounter.   Return in about 6 months (around 08/24/2020).  I have spent a total time of 31-minutes on the day of the appointment reviewing prior documentation, coordinating care and discussing medical diagnosis and plan with the patient/family. Imaging, labs and tests included in this note have been reviewed and interpreted independently by me.  [place], MD [facility] 02/25/2020 12:15 PM  Office Number [phone]

## 2020-02-25 NOTE — Patient Instructions (Signed)
Multiple lung nodules, ground glass  --Plan for CT Chest in 07/2020 to complete two years of nodule surveillance --No indication for bronchoscopy  Follow-up with me or NP after CT Chest

## 2020-02-26 ENCOUNTER — Encounter: Payer: Self-pay | Admitting: Pulmonary Disease

## 2020-03-02 DIAGNOSIS — M13131 Monoarthritis, not elsewhere classified, right wrist: Secondary | ICD-10-CM | POA: Diagnosis not present

## 2020-03-02 DIAGNOSIS — M1811 Unilateral primary osteoarthritis of first carpometacarpal joint, right hand: Secondary | ICD-10-CM | POA: Diagnosis not present

## 2020-03-02 DIAGNOSIS — G8918 Other acute postprocedural pain: Secondary | ICD-10-CM | POA: Diagnosis not present

## 2020-03-02 DIAGNOSIS — M19031 Primary osteoarthritis, right wrist: Secondary | ICD-10-CM | POA: Diagnosis not present

## 2020-03-17 DIAGNOSIS — M1811 Unilateral primary osteoarthritis of first carpometacarpal joint, right hand: Secondary | ICD-10-CM | POA: Diagnosis not present

## 2020-03-31 DIAGNOSIS — M1811 Unilateral primary osteoarthritis of first carpometacarpal joint, right hand: Secondary | ICD-10-CM | POA: Diagnosis not present

## 2020-04-01 ENCOUNTER — Ambulatory Visit (INDEPENDENT_AMBULATORY_CARE_PROVIDER_SITE_OTHER): Payer: Medicare Other

## 2020-04-01 ENCOUNTER — Ambulatory Visit: Payer: Medicare Other | Admitting: Podiatry

## 2020-04-01 ENCOUNTER — Encounter: Payer: Self-pay | Admitting: Podiatry

## 2020-04-01 ENCOUNTER — Other Ambulatory Visit: Payer: Self-pay

## 2020-04-01 DIAGNOSIS — M2041 Other hammer toe(s) (acquired), right foot: Secondary | ICD-10-CM

## 2020-04-01 DIAGNOSIS — M79674 Pain in right toe(s): Secondary | ICD-10-CM | POA: Diagnosis not present

## 2020-04-01 DIAGNOSIS — L84 Corns and callosities: Secondary | ICD-10-CM

## 2020-04-01 NOTE — Progress Notes (Signed)
Subjective:   Patient ID: Gabrielle [name], female   DOB: 75 y.o.   MRN: [id number]   HPI Patient presents stating this toe is really bothering me and I know I am probably getting need surgery.  She has had a TRAM several times by Dr. Posey Pronto which gives temporary relief   ROS      Objective:  Physical Exam  Neurovascular status intact with significant distal rotation digit 5 right with a distal lateral keratotic lesion painful that she is walking on secondary to the positional component of the toe     Assessment:  Structural abnormality fifth digit right with distal lateral keratotic lesion formation secondary to pressure     Plan:  H&P reviewed condition and recommended a distal derotational arthroplasty with probable exostectomy.  I would do this in the transverse rotation not oblique and this should elevate the toe and get the pressure off and also reduce the bone deformity.  I educated her on this and she will see Dr. Posey Pronto and will probably have this procedure done and today I debrided there was slight bleeding I applied Neosporin with dressing that she will keep on 24 hours and see back as needed  X-ray indicates it is more at the distal interphalangeal joint where there does appear to be rotational deformity

## 2020-04-06 DIAGNOSIS — Z1231 Encounter for screening mammogram for malignant neoplasm of breast: Secondary | ICD-10-CM | POA: Diagnosis not present

## 2020-04-08 DIAGNOSIS — K219 Gastro-esophageal reflux disease without esophagitis: Secondary | ICD-10-CM | POA: Diagnosis not present

## 2020-04-08 DIAGNOSIS — D509 Iron deficiency anemia, unspecified: Secondary | ICD-10-CM | POA: Diagnosis not present

## 2020-04-08 DIAGNOSIS — I10 Essential (primary) hypertension: Secondary | ICD-10-CM | POA: Diagnosis not present

## 2020-04-08 DIAGNOSIS — I252 Old myocardial infarction: Secondary | ICD-10-CM | POA: Diagnosis not present

## 2020-04-08 DIAGNOSIS — M8580 Other specified disorders of bone density and structure, unspecified site: Secondary | ICD-10-CM | POA: Diagnosis not present

## 2020-04-08 DIAGNOSIS — N189 Chronic kidney disease, unspecified: Secondary | ICD-10-CM | POA: Diagnosis not present

## 2020-04-08 DIAGNOSIS — E782 Mixed hyperlipidemia: Secondary | ICD-10-CM | POA: Diagnosis not present

## 2020-04-08 DIAGNOSIS — I2510 Atherosclerotic heart disease of native coronary artery without angina pectoris: Secondary | ICD-10-CM | POA: Diagnosis not present

## 2020-04-08 DIAGNOSIS — E039 Hypothyroidism, unspecified: Secondary | ICD-10-CM | POA: Diagnosis not present

## 2020-04-08 DIAGNOSIS — R944 Abnormal results of kidney function studies: Secondary | ICD-10-CM | POA: Diagnosis not present

## 2020-04-08 DIAGNOSIS — M47816 Spondylosis without myelopathy or radiculopathy, lumbar region: Secondary | ICD-10-CM | POA: Diagnosis not present

## 2020-04-14 DIAGNOSIS — M1811 Unilateral primary osteoarthritis of first carpometacarpal joint, right hand: Secondary | ICD-10-CM | POA: Diagnosis not present

## 2020-04-14 DIAGNOSIS — M25641 Stiffness of right hand, not elsewhere classified: Secondary | ICD-10-CM | POA: Diagnosis not present

## 2020-04-22 DIAGNOSIS — M25641 Stiffness of right hand, not elsewhere classified: Secondary | ICD-10-CM | POA: Diagnosis not present

## 2020-04-23 ENCOUNTER — Ambulatory Visit: Payer: Medicare Other | Admitting: Podiatry

## 2020-04-23 ENCOUNTER — Other Ambulatory Visit: Payer: Self-pay

## 2020-04-23 DIAGNOSIS — Z01818 Encounter for other preprocedural examination: Secondary | ICD-10-CM | POA: Diagnosis not present

## 2020-04-23 DIAGNOSIS — M205X1 Other deformities of toe(s) (acquired), right foot: Secondary | ICD-10-CM | POA: Diagnosis not present

## 2020-04-23 DIAGNOSIS — M2041 Other hammer toe(s) (acquired), right foot: Secondary | ICD-10-CM

## 2020-04-27 ENCOUNTER — Encounter: Payer: Self-pay | Admitting: Podiatry

## 2020-04-27 ENCOUNTER — Telehealth: Payer: Self-pay

## 2020-04-27 NOTE — Progress Notes (Signed)
Subjective:  Patient ID: Gabrielle [name], female    DOB: 04-Apr-1945,  MRN: [id number]  Chief Complaint  Patient presents with  . Surgery Consultation     Surgery Consultation right 5th toe. Pt states no change.     75 y.o. female presents with the above complaint.  Patient presents with follow-up of her right fifth digit hammertoe with underlying capsulitis and adductovarus deformity/rotation.  Patient states that there is still pain associated with it.  She would like to discuss surgical option at this time.  She denies any other acute complaints.  She has failed all conservative treatment options include shoe gear modification and injection and offloading.   Review of Systems: Negative except as noted in the HPI. Denies N/V/F/Ch.  Past Medical History:  Diagnosis Date  . Anemia   . Arthritis 01-17-11   degenerative disc disease-all joints  . Bursitis   . Cholecystitis with cholelithiasis 03/22/2018  . Cholesterol serum elevated 01-17-11   tx. meds  . GERD (gastroesophageal reflux disease) 01-17-11   tx. omeprazole  . Heart murmur    slight murmur  . High cholesterol   . Hypertension 01-17-11   tx. meds  . Hypothyroidism   . Leg pain    ABIs 2/18: normal bilaterally  . Neuropathy    FEET  . Osteoarthritis   . Peripheral neuropathy   . Pre-diabetes   . Pulmonary nodule   . Seasonal allergies   . Sinus problem 01-17-11   tx. Claritin D  . ST elevation (STEMI) myocardial infarction involving left circumflex coronary artery ([place]) 08/21/2015    Current Outpatient Medications:  .  acetaminophen (TYLENOL) 650 MG CR tablet, Take 1,950 mg by mouth daily., Disp: , Rfl:  .  aspirin EC 81 MG tablet, Take 81 mg by mouth at bedtime. , Disp: , Rfl:  .  Bempedoic Acid-Ezetimibe (NEXLIZET) 180-10 MG TABS, Take 1 tablet by mouth daily., Disp: 90 tablet, Rfl: 2 .  benazepril (LOTENSIN) 20 MG tablet, Take 1 tablet (20 mg total) by mouth 2 (two) times daily., Disp: 180 tablet, Rfl: 3 .   Calcium Carbonate-Vitamin D (CALCIUM 600 + D PO), Take 1 tablet by mouth 2 (two) times daily., Disp: , Rfl:  .  cephALEXin (KEFLEX) 500 MG capsule, cephalexin 500 mg capsule  TAKE 1 CAPSULE BY MOUTH 4 TIMES A DAY FOR 5 DAYS, Disp: , Rfl:  .  Cholecalciferol (VITAMIN D) 2000 units CAPS, Take 2,000 Units by mouth daily., Disp: , Rfl:  .  docusate sodium (COLACE) 100 MG capsule, Take 100 mg by mouth daily., Disp: , Rfl:  .  gabapentin (NEURONTIN) 600 MG tablet, Take 600 mg by mouth 2 (two) times daily., Disp: , Rfl:  .  Glucosamine-Chondroit-Vit C-Mn (GLUCOSAMINE CHONDR 1500 COMPLX PO), Take 2 tablets by mouth daily. 1500/1200, Disp: , Rfl:  .  icosapent Ethyl (VASCEPA) 1 g capsule, Take 2 capsules (2 g total) by mouth 2 (two) times daily., Disp: 360 capsule, Rfl: 3 .  levothyroxine (SYNTHROID, LEVOTHROID) 75 MCG tablet, Take 75 mcg by mouth daily before breakfast., Disp: , Rfl:  .  loratadine-pseudoephedrine (CLARITIN-D 24-HOUR) 10-240 MG per 24 hr tablet, Take 1 tablet by mouth every evening., Disp: , Rfl:  .  meclizine (ANTIVERT) 25 MG tablet, Take 25 mg by mouth 3 (three) times daily as needed for dizziness., Disp: , Rfl:  .  Melatonin 10 MG TABS, Take 10 mg by mouth at bedtime. , Disp: , Rfl:  .  methocarbamol (ROBAXIN) 500  MG tablet, Take 500 mg by mouth., Disp: , Rfl:  .  metoprolol tartrate (LOPRESSOR) 25 MG tablet, Take 0.5 tablets (12.5 mg total) by mouth 2 (two) times daily., Disp: 90 tablet, Rfl: 3 .  Misc Natural Products (GLUCOSAMINE CHONDROITIN ADV) TABS, 2 tablets, Disp: , Rfl:  .  nitroGLYCERIN (NITROSTAT) 0.4 MG SL tablet, Place 0.4 mg under the tongue every 5 (five) minutes as needed for chest pain., Disp: , Rfl:  .  NON FORMULARY, Place 1 drop under the tongue daily. CBD Oil, Disp: , Rfl:  .  omeprazole (PRILOSEC) 20 MG capsule, Take 20 mg by mouth daily with breakfast., Disp: , Rfl:  .  ondansetron (ZOFRAN-ODT) 8 MG disintegrating tablet, ondansetron 8 mg disintegrating tablet   TAKE 1 TABLET EVERY 8 HOURS BY ORAL ROUTE AS NEEDED FOR NAUSEA FOR 5 DAYS., Disp: , Rfl:  .  oxyCODONE (OXY IR/ROXICODONE) 5 MG immediate release tablet, oxycodone 5 mg tablet  TAKE 1 TABLET EVERY 4-6 HOURS BY ORAL ROUTE AS NEEDED FOR PAIN FOR 7 DAYS., Disp: , Rfl:  .  polyethylene glycol (MIRALAX / GLYCOLAX) packet, Take 17 g by mouth daily as needed for mild constipation. , Disp: , Rfl:  .  potassium chloride (KLOR-CON) 10 MEQ tablet, Take 1 tablet (10 mEq total) by mouth daily., Disp: 90 tablet, Rfl: 3 .  potassium chloride (KLOR-CON) 10 MEQ tablet, Take 10 mEq by mouth daily., Disp: , Rfl:  .  rosuvastatin (CRESTOR) 40 MG tablet, TAKE 1 TABLET BY MOUTH EVERYDAY AT BEDTIME, Disp: 90 tablet, Rfl: 3 .  triamterene-hydrochlorothiazide (MAXZIDE-25) 37.5-25 MG tablet, 1 tablet in the morning, Disp: , Rfl:   Social History   Tobacco Use  Smoking Status Former Smoker  . Packs/day: 1.00  . Years: 30.00  . Pack years: 30.00  . Types: Cigarettes  . Start date: 52  . Quit date: 08/13/1993  . Years since quitting: 26.7  Smokeless Tobacco Never Used    Allergies  Allergen Reactions  . Pravastatin Other (See Comments)    Effects liver function  . Penicillins Other (See Comments)    Allergy as an infant - no other information available Has patient had a PCN reaction causing immediate rash, facial/tongue/throat swelling, SOB or lightheadedness with hypotension: Unknown Has patient had a PCN reaction causing severe rash involving mucus membranes or skin necrosis: Unknown Has patient had a PCN reaction that required hospitalization: No Has patient had a PCN reaction occurring within the last 10 years: No If all of the above answers are "NO", then may proceed with Cephalospor   Objective:  There were no vitals filed for this visit. There is no height or weight on file to calculate BMI. Constitutional Well developed. Well nourished.  Vascular Dorsalis pedis pulses palpable  bilaterally. Posterior tibial pulses palpable bilaterally. Capillary refill normal to all digits.  No cyanosis or clubbing noted. Pedal hair growth normal.  Neurologic Normal speech. Oriented to person, place, and time. Epicritic sensation to light touch grossly present bilaterally.  Dermatologic Nails well groomed and normal in appearance. No open wounds. No skin lesions.  Orthopedic:  Right fifth digit pain on palpation to the lateral aspect of the digit at the hyperkeratotic lesion/corn.  Mild pain on the left fifth digit as well.  There is adductovarus rotation with a hammertoe contracture of the right fifth digit noted.  Semiflexible in nature.  No pain at the metatarsophalangeal joint.  No tailor's bunion deformity noted.   Radiographs: None Assessment:   1.  Acquired hammertoe of right foot   2. Adductovarus rotation of toe, acquired, right   3. Preoperative examination    Plan:  Patient was evaluated and treated and all questions answered.  Right fifth digit hammertoe contracture with adductovarus rotation/capsulitis -I explained the patient the etiology of hammertoe contracture and various treatment options were extensively discussed again with the patient.  She has failed conservative treatment options including padding protecting offloading shoe gear modification.  She states is extremely painful and she would like to discuss surgical options at this time.  I discussed my surgical plan which includes correction of the fifth digit hammertoe with derotational arthroplasty.  Patient states understanding and would like to proceed with the surgery.  I discussed with the patient in extensive detail my surgical incision placement as well as my surgical goals.  Patient states understanding -Informed surgical risk consent was reviewed and read aloud to the patient.I reviewed the films.I have discussed my findings with the patient in great detail.I have discussed all risks including  but not limited to infection, stiffness, scarring, limp, disability, deformity, damage to blood vessels and nerves, numbness, poor healing, need for braces, arthritis, chronic pain, amputation, death.All benefits and realistic expectations discussed in great detail.I have made no promises as to the outcome.I have provided realistic expectations.I have offered the patient a 2nd opinion, which they have declined and assured me they preferred to proceed despite the risks -A total of 33 minutes was spent in direct patient care as well as pre and post patient encounter activities.  This includes documentation as well as reviewing patient chart for labs, imaging, past medical, surgical, social, and family history as documented in the EMR.  I have reviewed medication allergies as documented in EMR.  I discussed the etiology of condition and treatment options from conservative to surgical care.  All risks and benefit of the treatment course was discussed in detail.  All questions were answered and return appointment was discussed.  Since the visit completed in an ambulatory/outpatient setting, the patient and/or parent/guardian has been advised to contact the providers office for worsening condition and seek medical treatment and/or call 911 if the patient deems either is necessary.    No follow-ups on file.

## 2020-04-27 NOTE — Telephone Encounter (Signed)
DOS 04/30/2020 - OFFICE SURGERY  HAMMERTOE REPAIR 5TH RT - [id number]  [facility] MEDICARE   NOTIFICATION/PRIOR AUTHORIZATION NUMBER CASE STATUS CASE STATUS REASON PRIMARY CARE PHYSICIAN [id number] Closed Case Was Managed And Is Now Complete Lennette Bihari L. Little ADVANCE NOTIFY DATE/TIME 04/27/2020 09:26 AM CDT COVERAGE STATUS OVERALL COVERAGE STATUS Covered/Approved 1-1 CODE DESCRIPTION COVERAGE STATUS DECISION DATE 1 28285 Correction, hammertoe (eg, interphalange more Covered/Approved 04/27/2020

## 2020-04-29 DIAGNOSIS — M25641 Stiffness of right hand, not elsewhere classified: Secondary | ICD-10-CM | POA: Diagnosis not present

## 2020-04-30 ENCOUNTER — Encounter: Payer: Self-pay | Admitting: Podiatry

## 2020-04-30 ENCOUNTER — Ambulatory Visit: Payer: Medicare Other | Admitting: Podiatry

## 2020-04-30 ENCOUNTER — Other Ambulatory Visit: Payer: Self-pay

## 2020-04-30 VITALS — BP 112/68 | HR 76 | Temp 98.0°F | Resp 16

## 2020-04-30 DIAGNOSIS — M2041 Other hammer toe(s) (acquired), right foot: Secondary | ICD-10-CM

## 2020-04-30 MED ORDER — OXYCODONE-ACETAMINOPHEN 5-325 MG PO TABS: 1.0000 | ORAL_TABLET | ORAL | 0 refills | Status: DC | PRN

## 2020-04-30 NOTE — Progress Notes (Signed)
Surgeon: Dr. Boneta Lucks Assistants: None Pre-operative diagnosis: Right fifth digit hammertoe Post-operative diagnosis: same Procedure: Right fifth digit derotational arthroplasty proximal interphalangeal joint Pathology: None Pertinent Intra-op findings: Hammertoe contracture noted at the PIPJ Anesthesia: 8 cc of one-to-one mixture 1% lidocaine plain half percent Marcaine plain Hemostasis: Ankle tourniquet for 10 minutes 250 mmHg pressure EBL: Minimal Materials: 3-0 Vicryl 4-0 nylon Injectables: None Complications: None  Indications for surgery: A 75 y.o. female presents with right painful fifth digit hammertoe contracture. Patient has failed all conservative therapy including but not limited to local injection, padding, shoe gear modification. She wishes to have surgical correction of the foot/deformity. It was determined that patient would benefit from right fifth digit derotational arthroplasty. Informed surgical risk consent was reviewed and read aloud to the patient.  I reviewed the films.  I have discussed my findings with the patient in great detail.  I have discussed all risks including but not limited to infection, stiffness, scarring, limp, disability, deformity, damage to blood vessels and nerves, numbness, poor healing, need for braces, arthritis, chronic pain, amputation, death.  All benefits and realistic expectations discussed in great detail.  I have made no promises as to the outcome.  I have provided realistic expectations.  I have offered the patient a 2nd opinion, which they have declined and assured me they preferred to proceed despite the risks   Procedure in detail: The patient was both verbally and visually identified by myself, the nursing staff, and anesthesia staff in the preoperative holding area. They were then transferred to the operating room and placed on the operative table in supine position.  Attention was directed to the right fifth digit, a skin marker was  used to delineate an oblique incision from medial distal to proximal lateral.  Using #15 blade the skin was delineated down to epidermis and dermis level to the level of the tendon.  At this time a transverse tenotomy was performed at the PIPJ joint to release the tendon and gain exposure to the PIPJ joint.  Using sagittal saw, the head of the proximal phalanx was resected in standard technique.  At this time good correction alignment noted with reduction rotation of the fifth digit.  The wound was irrigated thoroughly with saline solution.  Using 3-0 Monocryl, the extensor tendon was repaired with simple interrupted suture technique.  The skin was primarily closed with 3-0 nylon.  The toe and the foot was dressed with Xeroform, Betadine, 4 x 4 gauze Kerlix and Coban.  Tourniquet was deflated total time of 10 minutes.  Cap refill was noted to all digits.  At the conclusion of the procedure the patient was awoken from anesthesia and found to have tolerated the procedure well any complications. There were transferred to PACU with vital signs stable and vascular status intact.  Boneta Lucks, DPM

## 2020-05-03 ENCOUNTER — Telehealth: Payer: Self-pay | Admitting: Podiatry

## 2020-05-03 NOTE — Telephone Encounter (Signed)
Patient states there was a lot of bleeding through her bandages. Would like to know if she should try to rewrap her bandage herself or should she come in to have it done. Please advise

## 2020-05-04 NOTE — Telephone Encounter (Signed)
She can re-wrap it unless if she feels uncomfortable doing it

## 2020-05-05 ENCOUNTER — Ambulatory Visit: Payer: Medicare Other | Admitting: Podiatry

## 2020-05-07 ENCOUNTER — Other Ambulatory Visit: Payer: Self-pay

## 2020-05-07 ENCOUNTER — Ambulatory Visit (INDEPENDENT_AMBULATORY_CARE_PROVIDER_SITE_OTHER): Payer: Medicare Other | Admitting: Podiatry

## 2020-05-07 ENCOUNTER — Ambulatory Visit (INDEPENDENT_AMBULATORY_CARE_PROVIDER_SITE_OTHER): Payer: Medicare Other

## 2020-05-07 DIAGNOSIS — Z9889 Other specified postprocedural states: Secondary | ICD-10-CM

## 2020-05-07 DIAGNOSIS — M2041 Other hammer toe(s) (acquired), right foot: Secondary | ICD-10-CM

## 2020-05-07 DIAGNOSIS — M205X1 Other deformities of toe(s) (acquired), right foot: Secondary | ICD-10-CM

## 2020-05-10 NOTE — Telephone Encounter (Signed)
I was out of the office last week, and I will inform the patient. Thank you.

## 2020-05-11 ENCOUNTER — Encounter: Payer: Self-pay | Admitting: Podiatry

## 2020-05-11 NOTE — Progress Notes (Signed)
Subjective:  Patient ID: Gabrielle [name], female    DOB: 1945/11/30,  MRN: [id number]  Chief Complaint  Patient presents with  . Routine Post Op    PT stated that she has a small amount of discomfort     DOS: 04/30/2020 Procedure: Right fifth digit arthroplasty  75 y.o. female returns for post-op check.  Patient is doing well.  No acute complaints.  She states she has some occasional pain but overall doing much better.  She denies any other issues.  She is ambulating with a surgical shoe  Review of Systems: Negative except as noted in the HPI. Denies N/V/F/Ch.  Past Medical History:  Diagnosis Date  . Anemia   . Arthritis 01-17-11   degenerative disc disease-all joints  . Bursitis   . Cholecystitis with cholelithiasis 03/22/2018  . Cholesterol serum elevated 01-17-11   tx. meds  . GERD (gastroesophageal reflux disease) 01-17-11   tx. omeprazole  . Heart murmur    slight murmur  . High cholesterol   . Hypertension 01-17-11   tx. meds  . Hypothyroidism   . Leg pain    ABIs 2/18: normal bilaterally  . Neuropathy    FEET  . Osteoarthritis   . Peripheral neuropathy   . Pre-diabetes   . Pulmonary nodule   . Seasonal allergies   . Sinus problem 01-17-11   tx. Claritin D  . ST elevation (STEMI) myocardial infarction involving left circumflex coronary artery ([place]) 08/21/2015    Current Outpatient Medications:  .  acetaminophen (TYLENOL) 650 MG CR tablet, Take 1,950 mg by mouth daily., Disp: , Rfl:  .  aspirin EC 81 MG tablet, Take 81 mg by mouth at bedtime. , Disp: , Rfl:  .  Bempedoic Acid-Ezetimibe (NEXLIZET) 180-10 MG TABS, Take 1 tablet by mouth daily., Disp: 90 tablet, Rfl: 2 .  benazepril (LOTENSIN) 20 MG tablet, Take 1 tablet (20 mg total) by mouth 2 (two) times daily., Disp: 180 tablet, Rfl: 3 .  Calcium Carbonate-Vitamin D (CALCIUM 600 + D PO), Take 1 tablet by mouth 2 (two) times daily., Disp: , Rfl:  .  cephALEXin (KEFLEX) 500 MG capsule, cephalexin 500 mg capsule   TAKE 1 CAPSULE BY MOUTH 4 TIMES A DAY FOR 5 DAYS, Disp: , Rfl:  .  Cholecalciferol (VITAMIN D) 2000 units CAPS, Take 2,000 Units by mouth daily., Disp: , Rfl:  .  docusate sodium (COLACE) 100 MG capsule, Take 100 mg by mouth daily., Disp: , Rfl:  .  gabapentin (NEURONTIN) 600 MG tablet, Take 600 mg by mouth 2 (two) times daily., Disp: , Rfl:  .  Glucosamine-Chondroit-Vit C-Mn (GLUCOSAMINE CHONDR 1500 COMPLX PO), Take 2 tablets by mouth daily. 1500/1200, Disp: , Rfl:  .  icosapent Ethyl (VASCEPA) 1 g capsule, Take 2 capsules (2 g total) by mouth 2 (two) times daily., Disp: 360 capsule, Rfl: 3 .  levothyroxine (SYNTHROID, LEVOTHROID) 75 MCG tablet, Take 75 mcg by mouth daily before breakfast., Disp: , Rfl:  .  loratadine-pseudoephedrine (CLARITIN-D 24-HOUR) 10-240 MG per 24 hr tablet, Take 1 tablet by mouth every evening., Disp: , Rfl:  .  meclizine (ANTIVERT) 25 MG tablet, Take 25 mg by mouth 3 (three) times daily as needed for dizziness., Disp: , Rfl:  .  Melatonin 10 MG TABS, Take 10 mg by mouth at bedtime. , Disp: , Rfl:  .  methocarbamol (ROBAXIN) 500 MG tablet, Take 500 mg by mouth., Disp: , Rfl:  .  metoprolol tartrate (LOPRESSOR) 25 MG tablet,  Take 0.5 tablets (12.5 mg total) by mouth 2 (two) times daily., Disp: 90 tablet, Rfl: 3 .  Misc Natural Products (GLUCOSAMINE CHONDROITIN ADV) TABS, 2 tablets, Disp: , Rfl:  .  nitroGLYCERIN (NITROSTAT) 0.4 MG SL tablet, Place 0.4 mg under the tongue every 5 (five) minutes as needed for chest pain., Disp: , Rfl:  .  NON FORMULARY, Place 1 drop under the tongue daily. CBD Oil, Disp: , Rfl:  .  omeprazole (PRILOSEC) 20 MG capsule, Take 20 mg by mouth daily with breakfast., Disp: , Rfl:  .  ondansetron (ZOFRAN) 8 MG tablet, Zofran 8 mg tablet  Take 1 tablet every 8 hours by oral route as needed for nausea for 5 days., Disp: , Rfl:  .  ondansetron (ZOFRAN-ODT) 8 MG disintegrating tablet, ondansetron 8 mg disintegrating tablet  TAKE 1 TABLET EVERY 8 HOURS BY  ORAL ROUTE AS NEEDED FOR NAUSEA FOR 5 DAYS., Disp: , Rfl:  .  oxyCODONE (OXY IR/ROXICODONE) 5 MG immediate release tablet, oxycodone 5 mg tablet  TAKE 1 TABLET EVERY 4-6 HOURS BY ORAL ROUTE AS NEEDED FOR PAIN FOR 7 DAYS., Disp: , Rfl:  .  oxyCODONE-acetaminophen (PERCOCET) 5-325 MG tablet, Take 1-2 tablets by mouth every 4 (four) hours as needed for severe pain., Disp: 30 tablet, Rfl: 0 .  polyethylene glycol (MIRALAX / GLYCOLAX) packet, Take 17 g by mouth daily as needed for mild constipation. , Disp: , Rfl:  .  potassium chloride (KLOR-CON) 10 MEQ tablet, Take 1 tablet (10 mEq total) by mouth daily., Disp: 90 tablet, Rfl: 3 .  potassium chloride (KLOR-CON) 10 MEQ tablet, Take 10 mEq by mouth daily., Disp: , Rfl:  .  rosuvastatin (CRESTOR) 40 MG tablet, TAKE 1 TABLET BY MOUTH EVERYDAY AT BEDTIME, Disp: 90 tablet, Rfl: 3 .  triamterene-hydrochlorothiazide (MAXZIDE-25) 37.5-25 MG tablet, 1 tablet in the morning, Disp: , Rfl:   Social History   Tobacco Use  Smoking Status Former Smoker  . Packs/day: 1.00  . Years: 30.00  . Pack years: 30.00  . Types: Cigarettes  . Start date: 42  . Quit date: 08/13/1993  . Years since quitting: 26.7  Smokeless Tobacco Never Used    Allergies  Allergen Reactions  . Pravastatin Other (See Comments)    Effects liver function  . Penicillins Other (See Comments)    Allergy as an infant - no other information available Has patient had a PCN reaction causing immediate rash, facial/tongue/throat swelling, SOB or lightheadedness with hypotension: Unknown Has patient had a PCN reaction causing severe rash involving mucus membranes or skin necrosis: Unknown Has patient had a PCN reaction that required hospitalization: No Has patient had a PCN reaction occurring within the last 10 years: No If all of the above answers are "NO", then may proceed with Cephalospor   Objective:  There were no vitals filed for this visit. There is no height or weight on file to  calculate BMI. Constitutional Well developed. Well nourished.  Vascular Foot warm and well perfused. Capillary refill normal to all digits.   Neurologic Normal speech. Oriented to person, place, and time. Epicritic sensation to light touch grossly present bilaterally.  Dermatologic Skin healing well without signs of infection. Skin edges well coapted without signs of infection.  Orthopedic: Tenderness to palpation noted about the surgical site.   Radiographs: 3 views of skeletally mature the right foot: Good correction alignment noted.  Arthroplasty of the fifth digit noted Assessment:   1. Status post surgery   2. Acquired  hammertoe of right foot   3. Adductovarus rotation of toe, acquired, right    Plan:  Patient was evaluated and treated and all questions answered.  S/p foot surgery right -Progressing as expected post-operatively. -XR: See above -WB Status: Weightbearing as tolerated in surgical shoe -Sutures: Intact.  No clinical signs of dehiscence noted.  No clinical signs of infection noted. -Medications: None -Foot redressed.  No follow-ups on file.

## 2020-05-21 ENCOUNTER — Encounter: Payer: Medicare Other | Admitting: Podiatry

## 2020-05-28 ENCOUNTER — Encounter: Payer: Self-pay | Admitting: Podiatry

## 2020-05-28 ENCOUNTER — Ambulatory Visit (INDEPENDENT_AMBULATORY_CARE_PROVIDER_SITE_OTHER): Payer: Medicare Other | Admitting: Podiatry

## 2020-05-28 ENCOUNTER — Other Ambulatory Visit: Payer: Self-pay

## 2020-05-28 DIAGNOSIS — M2041 Other hammer toe(s) (acquired), right foot: Secondary | ICD-10-CM

## 2020-05-28 DIAGNOSIS — Z9889 Other specified postprocedural states: Secondary | ICD-10-CM

## 2020-05-28 NOTE — Progress Notes (Signed)
Subjective:  Patient ID: Gabrielle [name], female    DOB: January 19, 1946,  MRN: [id number]  Chief Complaint  Patient presents with  . Routine Post Op    POST OP DOS 4.1.22    DOS: 04/30/2020 Procedure: Right fifth digit arthroplasty  75 y.o. female returns for post-op check.  Patient is doing well.  No acute complaints.  She states she has some occasional pain but overall doing much better.  She denies any other issues.  She is ambulating with a surgical shoe  Review of Systems: Negative except as noted in the HPI. Denies N/V/F/Ch.  Past Medical History:  Diagnosis Date  . Anemia   . Arthritis 01-17-11   degenerative disc disease-all joints  . Bursitis   . Cholecystitis with cholelithiasis 03/22/2018  . Cholesterol serum elevated 01-17-11   tx. meds  . GERD (gastroesophageal reflux disease) 01-17-11   tx. omeprazole  . Heart murmur    slight murmur  . High cholesterol   . Hypertension 01-17-11   tx. meds  . Hypothyroidism   . Leg pain    ABIs 2/18: normal bilaterally  . Neuropathy    FEET  . Osteoarthritis   . Peripheral neuropathy   . Pre-diabetes   . Pulmonary nodule   . Seasonal allergies   . Sinus problem 01-17-11   tx. Claritin D  . ST elevation (STEMI) myocardial infarction involving left circumflex coronary artery ([place]) 08/21/2015    Current Outpatient Medications:  .  acetaminophen (TYLENOL) 650 MG CR tablet, Take 1,950 mg by mouth daily., Disp: , Rfl:  .  aspirin EC 81 MG tablet, Take 81 mg by mouth at bedtime. , Disp: , Rfl:  .  Bempedoic Acid-Ezetimibe (NEXLIZET) 180-10 MG TABS, Take 1 tablet by mouth daily., Disp: 90 tablet, Rfl: 2 .  benazepril (LOTENSIN) 20 MG tablet, Take 1 tablet (20 mg total) by mouth 2 (two) times daily., Disp: 180 tablet, Rfl: 3 .  Calcium Carbonate-Vitamin D (CALCIUM 600 + D PO), Take 1 tablet by mouth 2 (two) times daily., Disp: , Rfl:  .  cephALEXin (KEFLEX) 500 MG capsule, cephalexin 500 mg capsule  TAKE 1 CAPSULE BY MOUTH 4 TIMES A  DAY FOR 5 DAYS, Disp: , Rfl:  .  Cholecalciferol (VITAMIN D) 2000 units CAPS, Take 2,000 Units by mouth daily., Disp: , Rfl:  .  docusate sodium (COLACE) 100 MG capsule, Take 100 mg by mouth daily., Disp: , Rfl:  .  gabapentin (NEURONTIN) 600 MG tablet, Take 600 mg by mouth 2 (two) times daily., Disp: , Rfl:  .  Glucosamine-Chondroit-Vit C-Mn (GLUCOSAMINE CHONDR 1500 COMPLX PO), Take 2 tablets by mouth daily. 1500/1200, Disp: , Rfl:  .  icosapent Ethyl (VASCEPA) 1 g capsule, Take 2 capsules (2 g total) by mouth 2 (two) times daily., Disp: 360 capsule, Rfl: 3 .  levothyroxine (SYNTHROID, LEVOTHROID) 75 MCG tablet, Take 75 mcg by mouth daily before breakfast., Disp: , Rfl:  .  loratadine-pseudoephedrine (CLARITIN-D 24-HOUR) 10-240 MG per 24 hr tablet, Take 1 tablet by mouth every evening., Disp: , Rfl:  .  meclizine (ANTIVERT) 25 MG tablet, Take 25 mg by mouth 3 (three) times daily as needed for dizziness., Disp: , Rfl:  .  Melatonin 10 MG TABS, Take 10 mg by mouth at bedtime. , Disp: , Rfl:  .  methocarbamol (ROBAXIN) 500 MG tablet, Take 500 mg by mouth., Disp: , Rfl:  .  metoprolol tartrate (LOPRESSOR) 25 MG tablet, Take 0.5 tablets (12.5 mg total) by  mouth 2 (two) times daily., Disp: 90 tablet, Rfl: 3 .  Misc Natural Products (GLUCOSAMINE CHONDROITIN ADV) TABS, 2 tablets, Disp: , Rfl:  .  nitroGLYCERIN (NITROSTAT) 0.4 MG SL tablet, Place 0.4 mg under the tongue every 5 (five) minutes as needed for chest pain., Disp: , Rfl:  .  NON FORMULARY, Place 1 drop under the tongue daily. CBD Oil, Disp: , Rfl:  .  omeprazole (PRILOSEC) 20 MG capsule, Take 20 mg by mouth daily with breakfast., Disp: , Rfl:  .  ondansetron (ZOFRAN) 8 MG tablet, Zofran 8 mg tablet  Take 1 tablet every 8 hours by oral route as needed for nausea for 5 days., Disp: , Rfl:  .  ondansetron (ZOFRAN-ODT) 8 MG disintegrating tablet, ondansetron 8 mg disintegrating tablet  TAKE 1 TABLET EVERY 8 HOURS BY ORAL ROUTE AS NEEDED FOR NAUSEA  FOR 5 DAYS., Disp: , Rfl:  .  oxyCODONE (OXY IR/ROXICODONE) 5 MG immediate release tablet, oxycodone 5 mg tablet  TAKE 1 TABLET EVERY 4-6 HOURS BY ORAL ROUTE AS NEEDED FOR PAIN FOR 7 DAYS., Disp: , Rfl:  .  oxyCODONE-acetaminophen (PERCOCET) 5-325 MG tablet, Take 1-2 tablets by mouth every 4 (four) hours as needed for severe pain., Disp: 30 tablet, Rfl: 0 .  polyethylene glycol (MIRALAX / GLYCOLAX) packet, Take 17 g by mouth daily as needed for mild constipation. , Disp: , Rfl:  .  potassium chloride (KLOR-CON) 10 MEQ tablet, Take 1 tablet (10 mEq total) by mouth daily., Disp: 90 tablet, Rfl: 3 .  potassium chloride (KLOR-CON) 10 MEQ tablet, Take 10 mEq by mouth daily., Disp: , Rfl:  .  rosuvastatin (CRESTOR) 40 MG tablet, TAKE 1 TABLET BY MOUTH EVERYDAY AT BEDTIME, Disp: 90 tablet, Rfl: 3 .  triamterene-hydrochlorothiazide (MAXZIDE-25) 37.5-25 MG tablet, 1 tablet in the morning, Disp: , Rfl:   Social History   Tobacco Use  Smoking Status Former Smoker  . Packs/day: 1.00  . Years: 30.00  . Pack years: 30.00  . Types: Cigarettes  . Start date: 73  . Quit date: 08/13/1993  . Years since quitting: 26.8  Smokeless Tobacco Never Used    Allergies  Allergen Reactions  . Pravastatin Other (See Comments)    Effects liver function  . Penicillins Other (See Comments)    Allergy as an infant - no other information available Has patient had a PCN reaction causing immediate rash, facial/tongue/throat swelling, SOB or lightheadedness with hypotension: Unknown Has patient had a PCN reaction causing severe rash involving mucus membranes or skin necrosis: Unknown Has patient had a PCN reaction that required hospitalization: No Has patient had a PCN reaction occurring within the last 10 years: No If all of the above answers are "NO", then may proceed with Cephalospor   Objective:  There were no vitals filed for this visit. There is no height or weight on file to calculate BMI. Constitutional  Well developed. Well nourished.  Vascular Foot warm and well perfused. Capillary refill normal to all digits.   Neurologic Normal speech. Oriented to person, place, and time. Epicritic sensation to light touch grossly present bilaterally.  Dermatologic  skin completely epithelialized.  Good correction alignment noted of the fifth digit.  Orthopedic:  Now tenderness to palpation noted about the surgical site.   Radiographs: 3 views of skeletally mature the right foot: Good correction alignment noted.  Arthroplasty of the fifth digit noted Assessment:   1. Status post surgery   2. Acquired hammertoe of right foot  Plan:  Patient was evaluated and treated and all questions answered.  S/p foot surgery right -Progressing as expected post-operatively. -XR: See above -WB Status: Weight-bear as tolerated in regular shoe -Sutures: Removed no clinical signs of dehiscence noted.  No clinical signs of infection noted. -Medications: None -Patient has clinically healed on the toe correction looks in good position alignment.  At this time patient is officially discharged from my care if any foot and ankle issues arise have asked her to come back and see me.  She states understanding  No follow-ups on file.

## 2020-06-30 DIAGNOSIS — Z79899 Other long term (current) drug therapy: Secondary | ICD-10-CM | POA: Diagnosis not present

## 2020-06-30 DIAGNOSIS — M8580 Other specified disorders of bone density and structure, unspecified site: Secondary | ICD-10-CM | POA: Diagnosis not present

## 2020-06-30 DIAGNOSIS — D509 Iron deficiency anemia, unspecified: Secondary | ICD-10-CM | POA: Diagnosis not present

## 2020-06-30 DIAGNOSIS — K760 Fatty (change of) liver, not elsewhere classified: Secondary | ICD-10-CM | POA: Diagnosis not present

## 2020-06-30 DIAGNOSIS — E782 Mixed hyperlipidemia: Secondary | ICD-10-CM | POA: Diagnosis not present

## 2020-06-30 DIAGNOSIS — E871 Hypo-osmolality and hyponatremia: Secondary | ICD-10-CM | POA: Diagnosis not present

## 2020-06-30 DIAGNOSIS — E039 Hypothyroidism, unspecified: Secondary | ICD-10-CM | POA: Diagnosis not present

## 2020-06-30 DIAGNOSIS — Z0000 Encounter for general adult medical examination without abnormal findings: Secondary | ICD-10-CM | POA: Diagnosis not present

## 2020-06-30 DIAGNOSIS — R7301 Impaired fasting glucose: Secondary | ICD-10-CM | POA: Diagnosis not present

## 2020-06-30 DIAGNOSIS — I2510 Atherosclerotic heart disease of native coronary artery without angina pectoris: Secondary | ICD-10-CM | POA: Diagnosis not present

## 2020-06-30 DIAGNOSIS — I10 Essential (primary) hypertension: Secondary | ICD-10-CM | POA: Diagnosis not present

## 2020-06-30 DIAGNOSIS — G629 Polyneuropathy, unspecified: Secondary | ICD-10-CM | POA: Diagnosis not present

## 2020-07-05 ENCOUNTER — Other Ambulatory Visit: Payer: Self-pay | Admitting: Cardiovascular Disease

## 2020-07-05 DIAGNOSIS — E785 Hyperlipidemia, unspecified: Secondary | ICD-10-CM

## 2020-07-06 DIAGNOSIS — Z0000 Encounter for general adult medical examination without abnormal findings: Secondary | ICD-10-CM | POA: Diagnosis not present

## 2020-07-07 DIAGNOSIS — I2510 Atherosclerotic heart disease of native coronary artery without angina pectoris: Secondary | ICD-10-CM | POA: Diagnosis not present

## 2020-07-07 DIAGNOSIS — N183 Chronic kidney disease, stage 3 unspecified: Secondary | ICD-10-CM | POA: Diagnosis not present

## 2020-07-07 DIAGNOSIS — J309 Allergic rhinitis, unspecified: Secondary | ICD-10-CM | POA: Diagnosis not present

## 2020-07-07 DIAGNOSIS — M8580 Other specified disorders of bone density and structure, unspecified site: Secondary | ICD-10-CM | POA: Diagnosis not present

## 2020-07-07 DIAGNOSIS — D509 Iron deficiency anemia, unspecified: Secondary | ICD-10-CM | POA: Diagnosis not present

## 2020-07-07 DIAGNOSIS — G629 Polyneuropathy, unspecified: Secondary | ICD-10-CM | POA: Diagnosis not present

## 2020-07-07 DIAGNOSIS — E039 Hypothyroidism, unspecified: Secondary | ICD-10-CM | POA: Diagnosis not present

## 2020-07-07 DIAGNOSIS — Z0000 Encounter for general adult medical examination without abnormal findings: Secondary | ICD-10-CM | POA: Diagnosis not present

## 2020-07-07 DIAGNOSIS — K219 Gastro-esophageal reflux disease without esophagitis: Secondary | ICD-10-CM | POA: Diagnosis not present

## 2020-07-07 DIAGNOSIS — E538 Deficiency of other specified B group vitamins: Secondary | ICD-10-CM | POA: Diagnosis not present

## 2020-07-07 DIAGNOSIS — I10 Essential (primary) hypertension: Secondary | ICD-10-CM | POA: Diagnosis not present

## 2020-07-07 DIAGNOSIS — E782 Mixed hyperlipidemia: Secondary | ICD-10-CM | POA: Diagnosis not present

## 2020-07-27 DIAGNOSIS — H2513 Age-related nuclear cataract, bilateral: Secondary | ICD-10-CM | POA: Diagnosis not present

## 2020-07-30 ENCOUNTER — Other Ambulatory Visit: Payer: Self-pay

## 2020-07-30 ENCOUNTER — Ambulatory Visit: Payer: Medicare Other | Admitting: Podiatry

## 2020-07-30 DIAGNOSIS — M792 Neuralgia and neuritis, unspecified: Secondary | ICD-10-CM

## 2020-07-30 DIAGNOSIS — M2041 Other hammer toe(s) (acquired), right foot: Secondary | ICD-10-CM

## 2020-07-30 DIAGNOSIS — Z9889 Other specified postprocedural states: Secondary | ICD-10-CM

## 2020-08-04 ENCOUNTER — Encounter: Payer: Self-pay | Admitting: Podiatry

## 2020-08-04 NOTE — Progress Notes (Signed)
Subjective:  Patient ID: Gabrielle [name], female    DOB: 1945/09/25,  MRN: [id number]  Chief Complaint  Patient presents with   Routine Post Op    PT stated that she is still having some pain with her 5th toe     DOS: 04/30/2020 Procedure: Right fifth digit arthroplasty  75 y.o. female returns for post-op check.  Patient is doing well.  No acute complaints.  She states she has some occasional pain but overall doing much better.  She denies any other issues.  She is ambulating with a surgical shoe  Review of Systems: Negative except as noted in the HPI. Denies N/V/F/Ch.  Past Medical History:  Diagnosis Date   Anemia    Arthritis 01-17-11   degenerative disc disease-all joints   Bursitis    Cholecystitis with cholelithiasis 03/22/2018   Cholesterol serum elevated 01-17-11   tx. meds   GERD (gastroesophageal reflux disease) 01-17-11   tx. omeprazole   Heart murmur    slight murmur   High cholesterol    Hypertension 01-17-11   tx. meds   Hypothyroidism    Leg pain    ABIs 2/18: normal bilaterally   Neuropathy    FEET   Osteoarthritis    Peripheral neuropathy    Pre-diabetes    Pulmonary nodule    Seasonal allergies    Sinus problem 01-17-11   tx. Claritin D   ST elevation (STEMI) myocardial infarction involving left circumflex coronary artery ([place]) 08/21/2015    Current Outpatient Medications:    acetaminophen (TYLENOL) 650 MG CR tablet, Take 1,950 mg by mouth daily., Disp: , Rfl:    aspirin EC 81 MG tablet, Take 81 mg by mouth at bedtime. , Disp: , Rfl:    benazepril (LOTENSIN) 20 MG tablet, Take 1 tablet (20 mg total) by mouth 2 (two) times daily., Disp: 180 tablet, Rfl: 3   Calcium Carbonate-Vitamin D (CALCIUM 600 + D PO), Take 1 tablet by mouth 2 (two) times daily., Disp: , Rfl:    cephALEXin (KEFLEX) 500 MG capsule, cephalexin 500 mg capsule  TAKE 1 CAPSULE BY MOUTH 4 TIMES A DAY FOR 5 DAYS, Disp: , Rfl:    Cholecalciferol (VITAMIN D) 2000 units CAPS, Take 2,000  Units by mouth daily., Disp: , Rfl:    docusate sodium (COLACE) 100 MG capsule, Take 100 mg by mouth daily., Disp: , Rfl:    gabapentin (NEURONTIN) 600 MG tablet, Take 600 mg by mouth 2 (two) times daily., Disp: , Rfl:    Glucosamine-Chondroit-Vit C-Mn (GLUCOSAMINE CHONDR 1500 COMPLX PO), Take 2 tablets by mouth daily. 1500/1200, Disp: , Rfl:    icosapent Ethyl (VASCEPA) 1 g capsule, Take 2 capsules (2 g total) by mouth 2 (two) times daily., Disp: 360 capsule, Rfl: 0   levothyroxine (SYNTHROID, LEVOTHROID) 75 MCG tablet, Take 75 mcg by mouth daily before breakfast., Disp: , Rfl:    loratadine-pseudoephedrine (CLARITIN-D 24-HOUR) 10-240 MG per 24 hr tablet, Take 1 tablet by mouth every evening., Disp: , Rfl:    meclizine (ANTIVERT) 25 MG tablet, Take 25 mg by mouth 3 (three) times daily as needed for dizziness., Disp: , Rfl:    Melatonin 10 MG TABS, Take 10 mg by mouth at bedtime. , Disp: , Rfl:    methocarbamol (ROBAXIN) 500 MG tablet, Take 500 mg by mouth., Disp: , Rfl:    metoprolol tartrate (LOPRESSOR) 25 MG tablet, Take 0.5 tablets (12.5 mg total) by mouth 2 (two) times daily., Disp: 90 tablet, Rfl:  3   Misc Natural Products (GLUCOSAMINE CHONDROITIN ADV) TABS, 2 tablets, Disp: , Rfl:    NEXLIZET 180-10 MG TABS, TAKE 1 TABLET BY MOUTH EVERY DAY, Disp: 90 tablet, Rfl: 0   nitroGLYCERIN (NITROSTAT) 0.4 MG SL tablet, Place 0.4 mg under the tongue every 5 (five) minutes as needed for chest pain., Disp: , Rfl:    NON FORMULARY, Place 1 drop under the tongue daily. CBD Oil, Disp: , Rfl:    omeprazole (PRILOSEC) 20 MG capsule, Take 20 mg by mouth daily with breakfast., Disp: , Rfl:    ondansetron (ZOFRAN) 8 MG tablet, Zofran 8 mg tablet  Take 1 tablet every 8 hours by oral route as needed for nausea for 5 days., Disp: , Rfl:    ondansetron (ZOFRAN-ODT) 8 MG disintegrating tablet, ondansetron 8 mg disintegrating tablet  TAKE 1 TABLET EVERY 8 HOURS BY ORAL ROUTE AS NEEDED FOR NAUSEA FOR 5 DAYS., Disp: ,  Rfl:    oxyCODONE (OXY IR/ROXICODONE) 5 MG immediate release tablet, oxycodone 5 mg tablet  TAKE 1 TABLET EVERY 4-6 HOURS BY ORAL ROUTE AS NEEDED FOR PAIN FOR 7 DAYS., Disp: , Rfl:    oxyCODONE-acetaminophen (PERCOCET) 5-325 MG tablet, Take 1-2 tablets by mouth every 4 (four) hours as needed for severe pain., Disp: 30 tablet, Rfl: 0   polyethylene glycol (MIRALAX / GLYCOLAX) packet, Take 17 g by mouth daily as needed for mild constipation. , Disp: , Rfl:    potassium chloride (KLOR-CON) 10 MEQ tablet, Take 1 tablet (10 mEq total) by mouth daily., Disp: 90 tablet, Rfl: 3   potassium chloride (KLOR-CON) 10 MEQ tablet, Take 10 mEq by mouth daily., Disp: , Rfl:    rosuvastatin (CRESTOR) 40 MG tablet, TAKE 1 TABLET BY MOUTH EVERYDAY AT BEDTIME, Disp: 90 tablet, Rfl: 3   triamterene-hydrochlorothiazide (MAXZIDE-25) 37.5-25 MG tablet, 1 tablet in the morning, Disp: , Rfl:   Social History   Tobacco Use  Smoking Status Former   Packs/day: 1.00   Years: 30.00   Pack years: 30.00   Types: Cigarettes   Start date: 62   Quit date: 08/13/1993   Years since quitting: 26.9  Smokeless Tobacco Never    Allergies  Allergen Reactions   Pravastatin Other (See Comments)    Effects liver function   Penicillins Other (See Comments)    Allergy as an infant - no other information available Has patient had a PCN reaction causing immediate rash, facial/tongue/throat swelling, SOB or lightheadedness with hypotension: Unknown Has patient had a PCN reaction causing severe rash involving mucus membranes or skin necrosis: Unknown Has patient had a PCN reaction that required hospitalization: No Has patient had a PCN reaction occurring within the last 10 years: No If all of the above answers are "NO", then may proceed with Cephalospor   Objective:  There were no vitals filed for this visit. There is no height or weight on file to calculate BMI. Constitutional Well developed. Well nourished.  Vascular Foot  warm and well perfused. Capillary refill normal to all digits.   Neurologic Normal speech. Oriented to person, place, and time. Epicritic sensation to light touch grossly present bilaterally.  Numbness tingling subjective noted to the right fifth digit.  Motor or sensory functions are intact to the fifth digit  Dermatologic  skin completely epithelialized.  Good correction alignment noted of the fifth digit.  Orthopedic:  Now tenderness to palpation noted about the surgical site.   Radiographs: 3 views of skeletally mature the right foot: Good  correction alignment noted.  Arthroplasty of the fifth digit noted Assessment:   1. Acquired hammertoe of right foot   2. Status post surgery   3. Neuritis     Plan:  Patient was evaluated and treated and all questions answered.  Neuritis -Explained the patient the etiology of neuritis emergency room and options were discussed.  I discussed with her that this is completely normal since she is postsurgical it can take up to a year to completely go away.  Patient states understanding.  I still discussed shoe gear modification with her.  She states understanding and will work on that  S/p foot surgery right -Clinically has healed.  Is in good correction alignment No follow-ups on file.

## 2020-08-05 DIAGNOSIS — E538 Deficiency of other specified B group vitamins: Secondary | ICD-10-CM | POA: Diagnosis not present

## 2020-08-06 ENCOUNTER — Telehealth: Payer: Self-pay | Admitting: Cardiovascular Disease

## 2020-08-06 ENCOUNTER — Ambulatory Visit (INDEPENDENT_AMBULATORY_CARE_PROVIDER_SITE_OTHER)
Admission: RE | Admit: 2020-08-06 | Discharge: 2020-08-06 | Disposition: A | Discharge status: Home / Self Care | Payer: Medicare Other | Source: Ambulatory Visit | Attending: Pulmonary Disease | Admitting: Pulmonary Disease

## 2020-08-06 ENCOUNTER — Other Ambulatory Visit: Payer: Self-pay

## 2020-08-06 DIAGNOSIS — R911 Solitary pulmonary nodule: Secondary | ICD-10-CM | POA: Diagnosis not present

## 2020-08-06 DIAGNOSIS — R918 Other nonspecific abnormal finding of lung field: Secondary | ICD-10-CM | POA: Diagnosis not present

## 2020-08-06 DIAGNOSIS — I700 Atherosclerosis of aorta: Secondary | ICD-10-CM | POA: Diagnosis not present

## 2020-08-06 NOTE — Telephone Encounter (Signed)
Patient called and said she lost her emerald ring. It is a gold band with a solitary setting and a light green emerald. She came to the office 08/06/20 to have a CT done. When she got home she realized it was not on her hand.   I reached out to the CT department and the front desk staff, but neither had a ring turned in.  Please call the patient if the ring gets turned in

## 2020-08-10 ENCOUNTER — Telehealth: Payer: Self-pay

## 2020-08-10 DIAGNOSIS — R911 Solitary pulmonary nodule: Secondary | ICD-10-CM

## 2020-08-10 NOTE — Progress Notes (Signed)
I called and updated patient on CT results. Stable 66mm lung nodule in LLL. New 94m lingula nodule. Discussed with patient regarding surveillance of nodules. Will continue surveillance with CT Chest without contrast in one year.  Staff- please schedule CT with follow-up me after imaging

## 2020-08-10 NOTE — Telephone Encounter (Signed)
Order for CT placed and recall for OV placed. Nothing further needed at this time.

## 2020-08-10 NOTE — Telephone Encounter (Signed)
-----   Message from [place], MD sent at 08/10/2020 12:56 PM EDT ----- I called and updated patient on CT results. Stable 41mm lung nodule in LLL. New 76m lingula nodule. Discussed with patient regarding surveillance of nodules. Will continue surveillance with CT Chest without contrast in one year.  Staff- please schedule CT with follow-up me after imaging

## 2020-08-24 DIAGNOSIS — M545 Low back pain, unspecified: Secondary | ICD-10-CM | POA: Diagnosis not present

## 2020-08-24 DIAGNOSIS — Z981 Arthrodesis status: Secondary | ICD-10-CM | POA: Diagnosis not present

## 2020-08-24 DIAGNOSIS — M5136 Other intervertebral disc degeneration, lumbar region: Secondary | ICD-10-CM | POA: Diagnosis not present

## 2020-09-13 DIAGNOSIS — E538 Deficiency of other specified B group vitamins: Secondary | ICD-10-CM | POA: Diagnosis not present

## 2020-10-06 ENCOUNTER — Other Ambulatory Visit: Payer: Self-pay | Admitting: Cardiovascular Disease

## 2020-10-06 DIAGNOSIS — M545 Low back pain, unspecified: Secondary | ICD-10-CM | POA: Diagnosis not present

## 2020-10-06 DIAGNOSIS — E785 Hyperlipidemia, unspecified: Secondary | ICD-10-CM

## 2020-10-12 DIAGNOSIS — M545 Low back pain, unspecified: Secondary | ICD-10-CM | POA: Diagnosis not present

## 2020-10-14 DIAGNOSIS — E538 Deficiency of other specified B group vitamins: Secondary | ICD-10-CM | POA: Diagnosis not present

## 2020-10-19 DIAGNOSIS — M545 Low back pain, unspecified: Secondary | ICD-10-CM | POA: Diagnosis not present

## 2020-10-29 ENCOUNTER — Other Ambulatory Visit: Payer: Self-pay | Admitting: Cardiovascular Disease

## 2020-10-29 DIAGNOSIS — E785 Hyperlipidemia, unspecified: Secondary | ICD-10-CM

## 2020-11-02 DIAGNOSIS — M545 Low back pain, unspecified: Secondary | ICD-10-CM | POA: Diagnosis not present

## 2020-11-23 ENCOUNTER — Other Ambulatory Visit: Payer: Self-pay

## 2020-11-23 ENCOUNTER — Ambulatory Visit: Payer: Medicare Other | Admitting: Dermatology

## 2020-11-23 ENCOUNTER — Encounter: Payer: Self-pay | Admitting: Dermatology

## 2020-11-23 DIAGNOSIS — Q828 Other specified congenital malformations of skin: Secondary | ICD-10-CM

## 2020-11-23 DIAGNOSIS — Z1283 Encounter for screening for malignant neoplasm of skin: Secondary | ICD-10-CM | POA: Diagnosis not present

## 2020-11-23 DIAGNOSIS — L820 Inflamed seborrheic keratosis: Secondary | ICD-10-CM

## 2020-11-23 DIAGNOSIS — E538 Deficiency of other specified B group vitamins: Secondary | ICD-10-CM | POA: Diagnosis not present

## 2020-11-23 DIAGNOSIS — D485 Neoplasm of uncertain behavior of skin: Secondary | ICD-10-CM

## 2020-11-23 NOTE — Patient Instructions (Signed)

## 2020-12-10 ENCOUNTER — Encounter: Payer: Self-pay | Admitting: Dermatology

## 2020-12-10 NOTE — Progress Notes (Signed)
   New Patient   Subjective  Gabrielle [name] is a 75 y.o. female who presents for the following: Skin Problem (Spot on back x months- scaly and peeled off).  General skin examination, concern growth recurring sore spot on left back Location:  Duration:  Quality:  Associated Signs/Symptoms: Modifying Factors:  Severity:  Timing: Context:    The following portions of the chart were reviewed this encounter and updated as appropriate:  Tobacco  Allergies  Meds  Problems  Med Hx  Surg Hx  Fam Hx      Objective  Well appearing patient in no apparent distress; mood and affect are within normal limits. Mid Back Waist up skin exam.   Mid Back 1cm focally hemorrhagic light gray crust; dermoscopy compatible with traumatized keratosis     Left Ankle - Anterior, Left Hand - Posterior, Right Ankle - Anterior, Right Hand - Posterior Multiple 1 to 2 mm verrucous white papules    A full examination was performed including scalp, head, eyes, ears, nose, lips, neck, chest, axillae, abdomen, back, buttocks, bilateral upper extremities, bilateral lower extremities, hands, feet, fingers, toes, fingernails, and toenails. All findings within normal limits unless otherwise noted below.  Patient beneath undergarments not fully examined   Assessment & Plan  Screening for malignant neoplasm of skin Mid Back  Yearly skin exam.   Neoplasm of uncertain behavior of skin Mid Back  Skin / nail biopsy Type of biopsy: tangential   Informed consent: discussed and consent obtained   Timeout: patient name, date of birth, surgical site, and procedure verified   Anesthesia: the lesion was anesthetized in a standard fashion   Anesthetic:  1% lidocaine w/ epinephrine 1-100,000 local infiltration Instrument used: flexible razor blade   Hemostasis achieved with: ferric subsulfate   Outcome: patient tolerated procedure well   Post-procedure details: wound care instructions given    Specimen 1 -  Surgical pathology Differential Diagnosis: scc vs bcc, atypia, SK  Check Margins: No  Spot has gotten caught on clothing several times and bothering patient, will biopsy  Acrokeratosis verruciformis Left Hand - Posterior; Right Hand - Posterior; Left Ankle - Anterior; Right Ankle - Anterior  No intervention indicated

## 2020-12-14 DIAGNOSIS — M25561 Pain in right knee: Secondary | ICD-10-CM | POA: Diagnosis not present

## 2020-12-15 DIAGNOSIS — M25561 Pain in right knee: Secondary | ICD-10-CM | POA: Diagnosis not present

## 2020-12-21 DIAGNOSIS — E538 Deficiency of other specified B group vitamins: Secondary | ICD-10-CM | POA: Diagnosis not present

## 2021-01-04 ENCOUNTER — Other Ambulatory Visit: Payer: Self-pay | Admitting: Specialist

## 2021-01-04 ENCOUNTER — Other Ambulatory Visit (HOSPITAL_COMMUNITY): Payer: Self-pay | Admitting: Specialist

## 2021-01-04 DIAGNOSIS — M25561 Pain in right knee: Secondary | ICD-10-CM

## 2021-01-07 ENCOUNTER — Other Ambulatory Visit: Payer: Self-pay | Admitting: Cardiovascular Disease

## 2021-01-07 ENCOUNTER — Encounter: Payer: Self-pay | Admitting: Physician Assistant

## 2021-01-07 DIAGNOSIS — E785 Hyperlipidemia, unspecified: Secondary | ICD-10-CM

## 2021-01-07 NOTE — Progress Notes (Addendum)
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 She initially presented in 2017 with an inferolateral STEMI found to have occlusion of the first OM branch of the circumflex, treated with primary PCI. She then underwent staged stenting of the RCA. 2D echo that admission showed EF 63-14%, normal diastolic function, no major abnormalities.   She is seen back for follow-up today overall doing well. Her activity is primarily limited by chronic arthritis/orthopedic issues and peripheral neuropathy. Since decreasing her overall activity level, she has noticed a mild increase in dyspnea on exertion. This does not occur at rest or with ADLs but higher level of exertion like rushing up steps. She states she feels this is due to being less active and gaining weight. She recalls that when she had her MI, her symptoms were very dramatic with chest pain and profound diaphoresis. She has not had any symptoms like that. No edema, orthopnea, PND, syncope or dizziness. She sees her PCP tomorrow and anticipates they'll be rechecking labs.   Labwork independently reviewed: Scanned labs 06/2020 Hgb 12.8, plt wnl, Cr 1.23, K 4.9, LFTs ok, A1c 6.4,  trig 165, LDL 63, TSH wnl   Past Medical History:  Diagnosis Date   Anemia    Arthritis 01/17/2011   degenerative disc disease-all joints   Bursitis    CAD (coronary artery disease)    Cholecystitis with cholelithiasis 03/22/2018   Chronic kidney disease, stage 3a (HCC)    GERD (gastroesophageal reflux disease) 01/17/2011    tx. omeprazole   Heart murmur    slight murmur   Hyperlipidemia    Hypertension 01/17/2011   tx. meds   Hypothyroidism    Leg pain    ABIs 2/18: normal bilaterally   Neuropathy    FEET   Osteoarthritis    Peripheral neuropathy    Pre-diabetes    Pulmonary nodule    Seasonal allergies    Sinus problem 01/17/2011   tx. Claritin D   ST elevation (STEMI) myocardial infarction involving left circumflex coronary artery ([place]) 08/21/2015    Past Surgical History:  Procedure Laterality Date   ABDOMINAL HYSTERECTOMY  01-17-11   '93-Hysterectomy-heavy bleeding   ANTERIOR LATERAL LUMBAR FUSION WITH PERCUTANEOUS SCREW 1 LEVEL N/A 03/07/2017   Procedure: XLIF L3-4;  Surgeon: Melina Schools, MD;  Location: [place];  Service: Orthopedics;  Laterality: N/A;  4 hrs   APPENDECTOMY     BACK SURGERY  01-17-11   04-20-10-T-Lift lumbar fusion   [facility] PROCEDURE  01-17-11   Bladder sling   CARDIAC CATHETERIZATION N/A 08/21/2015   Procedure: Left Heart Cath and Coronary Angiography;  Surgeon: Sherren Mocha, MD;  Location: [place] [facility];  Service: Cardiovascular;  Laterality: N/A;   CARDIAC CATHETERIZATION N/A 08/21/2015   Procedure: Coronary Stent Intervention;  Surgeon: Sherren Mocha, MD;  Location: [place] [facility];  Service: Cardiovascular;  Laterality: N/A;   CARDIAC CATHETERIZATION N/A 08/23/2015   Procedure: Coronary Stent Intervention;  Surgeon: Leonie Man, MD;  Location: [place] [facility];  Service: Cardiovascular;  Laterality: N/A;  promus 2.5x16 in RCA   CARDIAC CATHETERIZATION N/A 08/23/2015   Procedure: Left Heart Cath and Coronary Angiography;  Surgeon: Leonie Man, MD;  Location: [place] [facility];  Service: Cardiovascular;  Laterality: N/A;   CARPAL TUNNEL RELEASE  01-17-11   '02-left, also right   CHOLECYSTECTOMY N/A 03/22/2018   Procedure: LAPAROSCOPIC CHOLECYSTECTOMY;  Surgeon: Fanny Skates, MD;  Location: [place];  Service: General;  Laterality: N/A;   COLONOSCOPY      HARDWARE REMOVAL Right 05/15/2013   Procedure: REMOVAL RIGHT L4-L5 PEDICLE SCREWS AND ROD ;  Surgeon: Melina Schools, MD;  Location: [place];  Service: Orthopedics;  Laterality: Right;   JOINT REPLACEMENT  01-17-11    RTKA' 02   LAPAROSCOPIC APPENDECTOMY N/A 08/13/2013   Procedure: APPENDECTOMY LAPAROSCOPIC;  Surgeon: Pedro Earls, MD;  Location: [facility];  Service: General;  Laterality: N/A;   TONSILLECTOMY  01-17-11   child   TOTAL KNEE ARTHROPLASTY  01/19/2011   Procedure: TOTAL KNEE ARTHROPLASTY;  Surgeon: Johnn Hai;  Location: [facility];  Service: Orthopedics;  Laterality: Left;   TOTAL SHOULDER ARTHROPLASTY Right 06/05/2019   Procedure: TOTAL SHOULDER ARTHROPLASTY;  Surgeon: Justice Britain, MD;  Location: [facility];  Service: Orthopedics;  Laterality: Right;  161min    Current Medications: Current Meds  Medication Sig   acetaminophen (TYLENOL) 650 MG CR tablet Take 1,950 mg by mouth daily.   aspirin EC 81 MG tablet Take 81 mg by mouth at bedtime.    benazepril (LOTENSIN) 20 MG tablet Take 1 tablet (20 mg total) by mouth 2 (two) times daily.   Calcium Carbonate-Vitamin D (CALCIUM 600 + D PO) Take 1 tablet by mouth 2 (two) times daily.   Cholecalciferol (VITAMIN D) 2000 units CAPS Take 2,000 Units by mouth daily.   docusate sodium (COLACE) 100 MG capsule Take 100 mg by mouth daily.   gabapentin (NEURONTIN) 600 MG tablet Take 600 mg by mouth 2 (two) times daily.   Glucosamine-Chondroit-Vit C-Mn (GLUCOSAMINE CHONDR 1500 COMPLX PO) Take 2 tablets by mouth daily. 1500/1200   icosapent Ethyl (VASCEPA) 1 g capsule TAKE 2 CAPSULES BY MOUTH 2 TIMES DAILY.   levothyroxine (SYNTHROID, LEVOTHROID) 75 MCG tablet Take 75 mcg by mouth daily before breakfast.   loratadine-pseudoephedrine (CLARITIN-D 24-HOUR) 10-240 MG per 24 hr tablet Take 1 tablet by mouth every evening.   meclizine (ANTIVERT) 25 MG tablet Take 25 mg by mouth 3 (three) times daily as needed for dizziness.   Melatonin 10 MG TABS Take 10  mg by mouth at bedtime.    metoprolol tartrate (LOPRESSOR) 25 MG tablet Take 0.5 tablets (12.5 mg total) by mouth 2 (two) times daily.   Misc Natural Products (GLUCOSAMINE CHONDROITIN ADV) TABS 2 tablets   NEXLIZET 180-10 MG TABS TAKE 1 TABLET BY MOUTH EVERY DAY   nitroGLYCERIN (NITROSTAT) 0.4 MG SL tablet Place 0.4 mg under the tongue every 5 (five) minutes as needed for chest pain.   NON FORMULARY Place 1 drop under the tongue daily. CBD Oil   omeprazole (PRILOSEC) 20 MG capsule Take 20 mg by mouth daily with breakfast.   polyethylene glycol (MIRALAX / GLYCOLAX) packet Take 17 g by mouth daily as needed for mild constipation.    potassium chloride (KLOR-CON) 10 MEQ tablet Take 1 tablet (10 mEq total) by mouth daily.   rosuvastatin (CRESTOR) 40 MG tablet TAKE 1 TABLET BY MOUTH EVERYDAY AT BEDTIME   triamterene-hydrochlorothiazide (MAXZIDE-25) 37.5-25 MG tablet 1 tablet in the morning  Allergies:   Pravastatin, Nsaids, Penicillins, and Pravastatin sodium   Social History   Socioeconomic History   Marital status: Married    Spouse name: Not on file   Number of children: 3   Years of education: BSN+   Highest education level: Not on file  Occupational History   Occupation: Retired  Tobacco Use   Smoking status: Former    Packs/day: 1.00    Years: 30.00    Pack years: 30.00    Types: Cigarettes    Start date: 1965    Quit date: 08/13/1993    Years since quitting: 27.4   Smokeless tobacco: Never  Vaping Use   Vaping Use: Never used  Substance and Sexual Activity   Alcohol use: Yes    Alcohol/week: 2.0 standard drinks    Types: 2 Glasses of wine per week    Comment: per day   Drug use: No   Sexual activity: Not Currently    Partners: Male    Comment: Married  Other Topics Concern   Not on file  Social History Narrative   Lives at home w/ her husband and granddaughter   Right-handed   Caffeine: 3 cups of tea daily   Social Determinants of Health   Financial Resource  Strain: Not on file  Food Insecurity: Not on file  Transportation Needs: Not on file  Physical Activity: Not on file  Stress: Not on file  Social Connections: Not on file     Family History:  The patient's family history includes Aneurysm in her mother; Diabetes in her brother and sister; Heart disease in her brother and father; Hyperlipidemia in her brother and sister; Hypertension in her brother and sister. There is no history of Neuropathy, Urticaria, Allergic rhinitis, or Asthma.  ROS:   Please see the history of present illness.  All other systems are reviewed and otherwise negative.    EKGs/Labs/Other Studies Reviewed:    Studies reviewed are outlined and summarized above. Reports included below if pertinent.  2d echo 07/2015 - Left ventricle: The cavity size was normal. Systolic function was    normal. The estimated ejection fraction was in the range of 55%    to 60%. Wall motion was normal; there were no regional wall    motion abnormalities. Left ventricular diastolic function    parameters were normal.  - Atrial septum: No defect or patent foramen ovale was identified.   PCI Cath 08/23/2015   Mid RCA lesion, 85 %stenosed. A STENT PROMUS PREM MR 2.5X16 drug eluting stent was successfully placed. Post intervention, there is a 0% residual stenosis. 1st Mrg stent appears to be widely patent Ost Cx to Prox Cx lesion, 70 %stenosed.     Successful focal PCI of mid RCA lesion with DES stent. Reviewed with Dr. Burt Knack, we both feel that the osteo-proximal circumflex lesion is a very difficult lesion to consider PCI as there is really minimal landing zone for stent.  This also somewhat calcified and likely chronic. The patient had not been having anginal symptoms prior to coming in, therefore it is unlikely to be flow-limiting. Plan for now is medical management.     Plan: Transfer to 6 central post procedure unit for TR band removal. Continue dual antiplatelet  therapy. Continue aggressive risk factor modification.   Expected discharge tomorrow. Will need follow-up with Dr. Burt Knack    First cath 08/21/15 Mid RCA lesion, 85% stenosed. Ost LM lesion, 40% stenosed. Prox LAD to Mid LAD lesion, 25% stenosed. Colon Fl[place]  Cx to Prox Cx lesion, 70% stenosed. There is mild left ventricular systolic dysfunction. 1st Mrg lesion, 100% stenosed. Post intervention, there is a 0% residual stenosis.   1. Acute inferolateral STEMI secondary to total occlusion of the first OM branch. Is is treated successfully with primary PCI.   2. Mild nonobstructive disease of the left main and LAD   3. Severe stenosis of the mid RCA: Recommend staged PCI prior to discharge   4. Mild segmental contraction abnormality the left ventricle with preserved overall LVEF   Recommendations: Aspirin and brilinta 12 months, aggressive medical therapy, staged PCI the right coronary artery on Monday.      EKG:  EKG is ordered today, personally reviewed, demonstrating NSR 78bpm, LAFB, left axis deviation, ST upsloping in V2 and TWI avL unchanged from prior  Recent Labs: No results found for requested labs within last 8760 hours.  Recent Lipid Panel    Component Value Date/Time   CHOL 146 10/27/2019 0813   TRIG 168 (H) 10/27/2019 0813   HDL 51 10/27/2019 0813   CHOLHDL 2.9 10/27/2019 0813   CHOLHDL 2.4 10/12/2015 0835   VLDL 28 10/12/2015 0835   LDLCALC 67 10/27/2019 0813    PHYSICAL EXAM:    VS:  BP 118/60   Pulse 78   Ht 5' (1.524 m)   Wt 183 lb 3.2 oz (83.1 kg)   SpO2 93%   BMI 35.78 kg/m   BMI: Body mass index is 35.78 kg/m.  GEN: Well nourished, well developed female in no acute distress HEENT: normocephalic, atraumatic Neck: no JVD, carotid bruits, or masses Cardiac: RRR; no murmurs, rubs, or gallops, no edema  Respiratory:  clear to auscultation bilaterally, normal work of breathing GI: soft, nontender, nondistended, + BS MS: no deformity or atrophy Skin: warm  and dry, no rash Neuro:  Alert and Oriented x 3, Strength and sensation are intact, follows commands Psych: euthymic mood, full affect  Wt Readings from Last 3 Encounters:  01/10/21 183 lb 3.2 oz (83.1 kg)  02/25/20 188 lb 12.8 oz (85.6 kg)  01/20/20 185 lb (83.9 kg)     ASSESSMENT & PLAN:   1. Dyspnea on exertion - patient feels this is likely due to deconditioning. She indicates she does not feel this is unexpected given her overall decline in level of activity. She is not having any chest pain like her prior angina. We will undertake a 2D echocardiogram to ensure LV function remains normal without WMA. Will also update 2V CXR to exclude new changes. She is seeing PCP tomorrow and would like to have all her labwork at one time there. We included on her AVS the basic labs we'd recommend. Warning sx reviewed as well.   2. CAD - overall felt to be stable, obtaining echo as above. Continue ASA, BB, rosuvastatin, Nexlizet, Vascepa. Lipids are followed in primary care.  3. Essential HTN, also with CKD stage 3a - patient was aware of prior abnormal kidney function. Baseline Cr looks around 1.2 by last assessments. Blood pressure is well controlled, no changes today.   4. Hyperlipidemia - continue rosuvastatin, Nexlizet, Vascepa. Lipids have been followed in primary care with last labs 06/2020. Last LDL in 06/2020 was  63 and triglycerides 165. Consider recheck at next OV in 6 months if not obtained tomorrow.    Disposition: F/u with Dr. Burt Knack or myself in 6 months, sooner if echo is abnormal or dyspnea does not improve with gradual reintroduction of activity as tolerated.  Medication  Adjustments/Labs and Tests Ordered: Current medicines are reviewed at length with the patient today.  Concerns regarding medicines are outlined above. Medication changes, Labs and Tests ordered today are summarized above and listed in the Patient Instructions accessible in Encounters.   Signed, Charlie Pitter, PA-C   01/10/2021 4:01 PM    [facility] Phone: [phone]; Fax: [phone]

## 2021-01-10 ENCOUNTER — Other Ambulatory Visit: Payer: Self-pay | Admitting: Cardiovascular Disease

## 2021-01-10 ENCOUNTER — Ambulatory Visit (INDEPENDENT_AMBULATORY_CARE_PROVIDER_SITE_OTHER): Payer: Medicare Other | Admitting: Physician Assistant

## 2021-01-10 ENCOUNTER — Other Ambulatory Visit: Payer: Self-pay

## 2021-01-10 ENCOUNTER — Encounter: Payer: Self-pay | Admitting: Physician Assistant

## 2021-01-10 VITALS — BP 118/60 | HR 78 | Ht 60.0 in | Wt 183.2 lb

## 2021-01-10 DIAGNOSIS — R0609 Other forms of dyspnea: Secondary | ICD-10-CM

## 2021-01-10 DIAGNOSIS — I2510 Atherosclerotic heart disease of native coronary artery without angina pectoris: Secondary | ICD-10-CM | POA: Diagnosis not present

## 2021-01-10 DIAGNOSIS — N1831 Chronic kidney disease, stage 3a: Secondary | ICD-10-CM

## 2021-01-10 DIAGNOSIS — E785 Hyperlipidemia, unspecified: Secondary | ICD-10-CM

## 2021-01-10 DIAGNOSIS — I10 Essential (primary) hypertension: Secondary | ICD-10-CM | POA: Diagnosis not present

## 2021-01-10 NOTE — Patient Instructions (Addendum)
Medication Instructions:  Your physician recommends that you continue on your current medications as directed. Please refer to the Current Medication list given to you today.  *If you need a refill on your cardiac medications before your next appointment, please call your pharmacy*   Lab Work: None ordered  If you have labs (blood work) drawn today and your tests are completely normal, you will receive your results only by: [place] (if you have MyChart) OR A paper copy in the mail If you have any lab test that is abnormal or we need to change your treatment, we will call you to review the results.   Testing/Procedures: Your physician has requested that you have an echocardiogram. Echocardiography is a painless test that uses sound waves to create images of your heart. It provides your doctor with information about the size and shape of your heart and how well your heart's chambers and valves are working. This procedure takes approximately one hour. There are no restrictions for this procedure.  A chest x-ray takes a picture of the organs and structures inside the chest, including the heart, lungs, and blood vessels. This test can show several things, including, whether the heart is enlarges; whether fluid is building up in the lungs; and whether pacemaker / defibrillator leads are still in place. Go to [facility], [address] [place], [place] [address] [phone] Mon- Friday before 4:30    Follow-Up: At [facility], you and your health needs are our priority.  As part of our continuing mission to provide you with exceptional heart care, we have created designated Provider Care Teams.  These Care Teams include your primary Cardiologist (physician) and Advanced Practice Providers (APPs -  Physician Assistants and Nurse Practitioners) who all work together to provide you with the care you need, when you need it.  We recommend signing up for the patient portal  called "MyChart".  Sign up information is provided on this After Visit Summary.  MyChart is used to connect with patients for Virtual Visits (Telemedicine).  Patients are able to view lab/test results, encounter notes, upcoming appointments, etc.  Non-urgent messages can be sent to your provider as well.   To learn more about what you can do with MyChart, go to [email or web address].    Your next appointment:   6 month(s)  07/19/21 ARRIVE AT 9:00  The format for your next appointment:   In Person  Provider:   Sherren Mocha, MD     Other Instructions Please make sure your primary care provider is aware of your shortness of breath with exertion. We would recommend getting a BMET, CBC, TSH, and BNP (or pro-BNP) with your labwork tomorrow. If there is any issue with getting these drawn, please call our office and we are happy to arrange.

## 2021-01-11 ENCOUNTER — Ambulatory Visit
Admission: RE | Admit: 2021-01-11 | Discharge: 2021-01-11 | Disposition: A | Discharge status: Home / Self Care | Payer: Medicare Other | Source: Ambulatory Visit | Attending: Physician Assistant | Admitting: Physician Assistant

## 2021-01-11 DIAGNOSIS — Z8679 Personal history of other diseases of the circulatory system: Secondary | ICD-10-CM | POA: Diagnosis not present

## 2021-01-11 DIAGNOSIS — R0609 Other forms of dyspnea: Secondary | ICD-10-CM

## 2021-01-11 DIAGNOSIS — E785 Hyperlipidemia, unspecified: Secondary | ICD-10-CM

## 2021-01-11 DIAGNOSIS — I10 Essential (primary) hypertension: Secondary | ICD-10-CM

## 2021-01-11 DIAGNOSIS — I2510 Atherosclerotic heart disease of native coronary artery without angina pectoris: Secondary | ICD-10-CM

## 2021-01-11 DIAGNOSIS — R0602 Shortness of breath: Secondary | ICD-10-CM | POA: Diagnosis not present

## 2021-01-12 DIAGNOSIS — G629 Polyneuropathy, unspecified: Secondary | ICD-10-CM | POA: Diagnosis not present

## 2021-01-12 DIAGNOSIS — R7303 Prediabetes: Secondary | ICD-10-CM | POA: Diagnosis not present

## 2021-01-12 DIAGNOSIS — E538 Deficiency of other specified B group vitamins: Secondary | ICD-10-CM | POA: Diagnosis not present

## 2021-01-12 DIAGNOSIS — R0609 Other forms of dyspnea: Secondary | ICD-10-CM | POA: Diagnosis not present

## 2021-01-12 NOTE — Progress Notes (Signed)
Pt has been made aware of normal result and verbalized understanding.   She doesn't see her PCP until today and she said she will ask them to fax over copy of lab results.

## 2021-01-13 ENCOUNTER — Other Ambulatory Visit: Payer: Self-pay | Admitting: Cardiovascular Disease

## 2021-01-14 ENCOUNTER — Encounter (HOSPITAL_COMMUNITY)
Admission: RE | Admit: 2021-01-14 | Discharge: 2021-01-14 | Disposition: A | Discharge status: Home / Self Care | Payer: Medicare Other | Source: Ambulatory Visit | Attending: Specialist | Admitting: Specialist

## 2021-01-14 ENCOUNTER — Other Ambulatory Visit: Payer: Self-pay

## 2021-01-14 DIAGNOSIS — M7989 Other specified soft tissue disorders: Secondary | ICD-10-CM | POA: Diagnosis not present

## 2021-01-14 DIAGNOSIS — M25561 Pain in right knee: Secondary | ICD-10-CM

## 2021-01-14 MED ORDER — TECHNETIUM TC 99M MEDRONATE IV KIT
19.4000 | PACK | Freq: Once | INTRAVENOUS | Status: AC
  Administered 2021-01-14: 19.4 via INTRAVENOUS

## 2021-01-18 DIAGNOSIS — M8580 Other specified disorders of bone density and structure, unspecified site: Secondary | ICD-10-CM | POA: Diagnosis not present

## 2021-01-18 DIAGNOSIS — N183 Chronic kidney disease, stage 3 unspecified: Secondary | ICD-10-CM | POA: Diagnosis not present

## 2021-01-18 DIAGNOSIS — E039 Hypothyroidism, unspecified: Secondary | ICD-10-CM | POA: Diagnosis not present

## 2021-01-18 DIAGNOSIS — I10 Essential (primary) hypertension: Secondary | ICD-10-CM | POA: Diagnosis not present

## 2021-01-18 DIAGNOSIS — K219 Gastro-esophageal reflux disease without esophagitis: Secondary | ICD-10-CM | POA: Diagnosis not present

## 2021-01-18 DIAGNOSIS — I252 Old myocardial infarction: Secondary | ICD-10-CM | POA: Diagnosis not present

## 2021-01-18 DIAGNOSIS — I2510 Atherosclerotic heart disease of native coronary artery without angina pectoris: Secondary | ICD-10-CM | POA: Diagnosis not present

## 2021-01-18 DIAGNOSIS — E782 Mixed hyperlipidemia: Secondary | ICD-10-CM | POA: Diagnosis not present

## 2021-01-19 DIAGNOSIS — M25561 Pain in right knee: Secondary | ICD-10-CM | POA: Diagnosis not present

## 2021-01-27 DIAGNOSIS — U071 COVID-19: Secondary | ICD-10-CM | POA: Diagnosis not present

## 2021-01-29 ENCOUNTER — Other Ambulatory Visit: Payer: Self-pay | Admitting: Cardiovascular Disease

## 2021-01-29 DIAGNOSIS — E785 Hyperlipidemia, unspecified: Secondary | ICD-10-CM

## 2021-02-15 ENCOUNTER — Ambulatory Visit (HOSPITAL_COMMUNITY): Payer: Medicare Other | Attending: Physician Assistant

## 2021-02-15 ENCOUNTER — Other Ambulatory Visit: Payer: Self-pay

## 2021-02-15 DIAGNOSIS — I2510 Atherosclerotic heart disease of native coronary artery without angina pectoris: Secondary | ICD-10-CM

## 2021-02-15 DIAGNOSIS — I10 Essential (primary) hypertension: Secondary | ICD-10-CM | POA: Diagnosis not present

## 2021-02-15 DIAGNOSIS — E785 Hyperlipidemia, unspecified: Secondary | ICD-10-CM | POA: Diagnosis not present

## 2021-02-15 DIAGNOSIS — R0609 Other forms of dyspnea: Secondary | ICD-10-CM | POA: Diagnosis not present

## 2021-02-15 LAB — ECHOCARDIOGRAM COMPLETE
Area-P 1/2: 7.02 cm2
S' Lateral: 2 cm

## 2021-04-19 DIAGNOSIS — M8589 Other specified disorders of bone density and structure, multiple sites: Secondary | ICD-10-CM | POA: Diagnosis not present

## 2021-04-19 DIAGNOSIS — Z1231 Encounter for screening mammogram for malignant neoplasm of breast: Secondary | ICD-10-CM | POA: Diagnosis not present

## 2021-05-26 ENCOUNTER — Other Ambulatory Visit: Payer: Self-pay | Admitting: Cardiovascular Disease

## 2021-05-26 DIAGNOSIS — E785 Hyperlipidemia, unspecified: Secondary | ICD-10-CM

## 2021-07-08 DIAGNOSIS — R7303 Prediabetes: Secondary | ICD-10-CM | POA: Diagnosis not present

## 2021-07-08 DIAGNOSIS — E039 Hypothyroidism, unspecified: Secondary | ICD-10-CM | POA: Diagnosis not present

## 2021-07-08 DIAGNOSIS — Z0000 Encounter for general adult medical examination without abnormal findings: Secondary | ICD-10-CM | POA: Diagnosis not present

## 2021-07-08 DIAGNOSIS — E782 Mixed hyperlipidemia: Secondary | ICD-10-CM | POA: Diagnosis not present

## 2021-07-08 DIAGNOSIS — D509 Iron deficiency anemia, unspecified: Secondary | ICD-10-CM | POA: Diagnosis not present

## 2021-07-08 DIAGNOSIS — E538 Deficiency of other specified B group vitamins: Secondary | ICD-10-CM | POA: Diagnosis not present

## 2021-07-08 DIAGNOSIS — I10 Essential (primary) hypertension: Secondary | ICD-10-CM | POA: Diagnosis not present

## 2021-07-19 ENCOUNTER — Encounter: Payer: Self-pay | Admitting: Cardiovascular Disease

## 2021-07-19 ENCOUNTER — Telehealth: Payer: Self-pay | Admitting: Cardiovascular Disease

## 2021-07-19 ENCOUNTER — Ambulatory Visit: Payer: Medicare Other | Admitting: Cardiovascular Disease

## 2021-07-19 VITALS — BP 110/60 | HR 88 | Ht 60.0 in | Wt 182.0 lb

## 2021-07-19 DIAGNOSIS — E782 Mixed hyperlipidemia: Secondary | ICD-10-CM | POA: Diagnosis not present

## 2021-07-19 DIAGNOSIS — I10 Essential (primary) hypertension: Secondary | ICD-10-CM | POA: Diagnosis not present

## 2021-07-19 DIAGNOSIS — N1831 Chronic kidney disease, stage 3a: Secondary | ICD-10-CM

## 2021-07-19 DIAGNOSIS — I2510 Atherosclerotic heart disease of native coronary artery without angina pectoris: Secondary | ICD-10-CM | POA: Diagnosis not present

## 2021-07-19 NOTE — Progress Notes (Signed)
Cardiology Office Note:    Date:  07/19/2021   ID:  Gabrielle [name], DOB Oct 01, 1945, MRN [id number]  PCP:  Lawerance Cruel, MD   [facility] Providers Cardiologist:  Sherren Mocha, MD     Referring MD: Lawerance Cruel, MD   Chief Complaint  Patient presents with   Coronary Artery Disease    History of Present Illness:    Gabrielle [name] is a 76 y.o. female with a hx of  CAD, anemia, arthritis, GERD, HLD, hypothyroidism, HTN, peripheral neuropathy, pre-DM, pulmonary nodules (followed by pulmonology), CKD 3a by labs who presents for follow-up. She initially presented in 2017 with an inferolateral STEMI found to have occlusion of the first OM branch of the circumflex, treated with primary PCI. She then underwent staged stenting of the RCA. 2D echo that admission showed EF 21-19%, normal diastolic function, no major abnormalities.  The patient is here alone today.  She is doing well from a cardiac perspective and denies chest pain, chest pressure, shortness of breath, heart palpitations, leg swelling, orthopnea, or PND.  She has had no lightheadedness or syncope.  She reports no recent changes in her medications.  She is tolerating multidrug lipid-lowering therapy well and is pleased with her most recent labs which show an LDL cholesterol of 50 mg/dL.  Past Medical History:  Diagnosis Date   Anemia    Arthritis 01/17/2011   degenerative disc disease-all joints   Bursitis    CAD (coronary artery disease)    Cholecystitis with cholelithiasis 03/22/2018   Chronic kidney disease, stage 3a (HCC)    GERD (gastroesophageal reflux disease) 01/17/2011   tx. omeprazole   Heart murmur    slight murmur   Hyperlipidemia    Hypertension 01/17/2011   tx. meds   Hypothyroidism    Leg pain    ABIs 2/18: normal bilaterally   Neuropathy    FEET   Osteoarthritis    Peripheral neuropathy    Pre-diabetes    Pulmonary nodule    Seasonal allergies    Sinus problem 01/17/2011   tx.  Claritin D   ST elevation (STEMI) myocardial infarction involving left circumflex coronary artery ([place]) 08/21/2015    Past Surgical History:  Procedure Laterality Date   ABDOMINAL HYSTERECTOMY  01-17-11   '93-Hysterectomy-heavy bleeding   ANTERIOR LATERAL LUMBAR FUSION WITH PERCUTANEOUS SCREW 1 LEVEL N/A 03/07/2017   Procedure: XLIF L3-4;  Surgeon: Melina Schools, MD;  Location: [place];  Service: Orthopedics;  Laterality: N/A;  4 hrs   APPENDECTOMY     BACK SURGERY  01-17-11   04-20-10-T-Lift lumbar fusion   [facility] PROCEDURE  01-17-11   Bladder sling   CARDIAC CATHETERIZATION N/A 08/21/2015   Procedure: Left Heart Cath and Coronary Angiography;  Surgeon: Sherren Mocha, MD;  Location: [place] [facility];  Service: Cardiovascular;  Laterality: N/A;   CARDIAC CATHETERIZATION N/A 08/21/2015   Procedure: Coronary Stent Intervention;  Surgeon: Sherren Mocha, MD;  Location: [place] [facility];  Service: Cardiovascular;  Laterality: N/A;   CARDIAC CATHETERIZATION N/A 08/23/2015   Procedure: Coronary Stent Intervention;  Surgeon: Leonie Man, MD;  Location: [place] [facility];  Service: Cardiovascular;  Laterality: N/A;  promus 2.5x16 in RCA   CARDIAC CATHETERIZATION N/A 08/23/2015   Procedure: Left Heart Cath and Coronary Angiography;  Surgeon: Leonie Man, MD;  Location: [place] [facility];  Service: Cardiovascular;  Laterality: N/A;   CARPAL TUNNEL RELEASE  01-17-11   '02-left, also right   CHOLECYSTECTOMY  N/A 03/22/2018   Procedure: LAPAROSCOPIC CHOLECYSTECTOMY;  Surgeon: Fanny Skates, MD;  Location: [place];  Service: General;  Laterality: N/A;   COLONOSCOPY     HARDWARE REMOVAL Right 05/15/2013   Procedure: REMOVAL RIGHT L4-L5 PEDICLE SCREWS AND ROD ;  Surgeon: Melina Schools, MD;  Location: [place];  Service: Orthopedics;  Laterality: Right;   JOINT REPLACEMENT  01-17-11    RTKA' 02   LAPAROSCOPIC APPENDECTOMY N/A 08/13/2013   Procedure: APPENDECTOMY LAPAROSCOPIC;  Surgeon: Pedro Earls, MD;  Location: [facility];  Service: General;  Laterality: N/A;   TONSILLECTOMY  01-17-11   child   TOTAL KNEE ARTHROPLASTY  01/19/2011   Procedure: TOTAL KNEE ARTHROPLASTY;  Surgeon: Johnn Hai;  Location: [facility];  Service: Orthopedics;  Laterality: Left;   TOTAL SHOULDER ARTHROPLASTY Right 06/05/2019   Procedure: TOTAL SHOULDER ARTHROPLASTY;  Surgeon: Justice Britain, MD;  Location: [facility];  Service: Orthopedics;  Laterality: Right;  178mn    Current Medications: Current Meds  Medication Sig   acetaminophen (TYLENOL) 650 MG CR tablet Take 1,950 mg by mouth daily.   aspirin EC 81 MG tablet Take 81 mg by mouth at bedtime.    benazepril (LOTENSIN) 20 MG tablet Take 1 tablet (20 mg total) by mouth 2 (two) times daily.   Calcium Carbonate-Vitamin D (CALCIUM 600 + D PO) Take 1 tablet by mouth 2 (two) times daily.   Cholecalciferol (VITAMIN D) 2000 units CAPS Take 2,000 Units by mouth daily.   Cyanocobalamin (VITAMIN B-12 PO) Take 5,000 Units by mouth every other day.   docusate sodium (COLACE) 100 MG capsule Take 100 mg by mouth daily.   gabapentin (NEURONTIN) 600 MG tablet Take 600 mg by mouth 2 (two) times daily.   Glucosamine-Chondroit-Vit C-Mn (GLUCOSAMINE CHONDR 1500 COMPLX PO) Take 2 tablets by mouth daily. 1500/1200   icosapent Ethyl (VASCEPA) 1 g capsule TAKE 2 CAPSULES BY MOUTH 2 TIMES DAILY.   levothyroxine (SYNTHROID, LEVOTHROID) 75 MCG tablet Take 75 mcg by mouth daily before breakfast.   loratadine-pseudoephedrine (CLARITIN-D 24-HOUR) 10-240 MG per 24 hr tablet Take 1 tablet by mouth every evening.   meclizine (ANTIVERT) 25 MG tablet Take 25 mg by mouth 3 (three) times daily as needed for dizziness.   Melatonin 10 MG TABS Take 10 mg by mouth at bedtime.    metoprolol tartrate (LOPRESSOR) 25 MG tablet Take 0.5 tablets (12.5 mg total) by mouth 2 (two) times daily.   Misc Natural Products (GLUCOSAMINE CHONDROITIN ADV) TABS 2 tablets   NEXLIZET 180-10 MG TABS TAKE 1 TABLET BY  MOUTH EVERY DAY   nitroGLYCERIN (NITROSTAT) 0.4 MG SL tablet Place 0.4 mg under the tongue every 5 (five) minutes as needed for chest pain.   NON FORMULARY Place 1 drop under the tongue daily. CBD Oil   omeprazole (PRILOSEC) 20 MG capsule Take 20 mg by mouth daily with breakfast.   polyethylene glycol (MIRALAX / GLYCOLAX) packet Take 17 g by mouth daily as needed for mild constipation.    rosuvastatin (CRESTOR) 40 MG tablet TAKE 1 TABLET BY MOUTH EVERYDAY AT BEDTIME   triamterene-hydrochlorothiazide (MAXZIDE-25) 37.5-25 MG tablet 1 tablet in the morning   [DISCONTINUED] potassium chloride (KLOR-CON) 10 MEQ tablet Take 1 tablet (10 mEq total) by mouth daily.     Allergies:   Pravastatin, Nsaids, Penicillins, and Pravastatin sodium   Social History   Socioeconomic History   Marital status: Married    Spouse name: Not on file   Number of children: 3  Years of education: BSN+   Highest education level: Not on file  Occupational History   Occupation: Retired  Tobacco Use   Smoking status: Former    Packs/day: 1.00    Years: 30.00    Total pack years: 30.00    Types: Cigarettes    Start date: 1965    Quit date: 08/13/1993    Years since quitting: 27.9   Smokeless tobacco: Never  Vaping Use   Vaping Use: Never used  Substance and Sexual Activity   Alcohol use: Yes    Alcohol/week: 2.0 standard drinks of alcohol    Types: 2 Glasses of wine per week    Comment: per day   Drug use: No   Sexual activity: Not Currently    Partners: Male    Comment: Married  Other Topics Concern   Not on file  Social History Narrative   Lives at home w/ her husband and granddaughter   Right-handed   Caffeine: 3 cups of tea daily   Social Determinants of Health   Financial Resource Strain: Not on file  Food Insecurity: Not on file  Transportation Needs: Not on file  Physical Activity: Not on file  Stress: Not on file  Social Connections: Not on file     Family History: The patient's  family history includes Aneurysm in her mother; Diabetes in her brother and sister; Heart disease in her brother and father; Hyperlipidemia in her brother and sister; Hypertension in her brother and sister. There is no history of Neuropathy, Urticaria, Allergic rhinitis, or Asthma.  ROS:   Please see the history of present illness.    Positive for back pain, neuropathy.  All other systems reviewed and are negative.  EKGs/Labs/Other Studies Reviewed:    The following studies were reviewed today: Echo 02/15/2021: 1. Left ventricular ejection fraction, by estimation, is 60 to 65%. The  left ventricle has normal function. The left ventricle has no regional  wall motion abnormalities. Left ventricular diastolic parameters are  consistent with Grade I diastolic  dysfunction (impaired relaxation).   2. Right ventricular systolic function is normal. The right ventricular  size is normal. There is normal pulmonary artery systolic pressure.   3. The mitral valve is normal in structure. No evidence of mitral valve  regurgitation. No evidence of mitral stenosis. Moderate mitral annular  calcification.   4. The aortic valve is tricuspid. There is mild calcification of the  aortic valve. There is mild thickening of the aortic valve. Aortic valve  regurgitation is not visualized. Aortic valve sclerosis is present, with  no evidence of aortic valve stenosis.   5. The inferior vena cava is normal in size with greater than 50%  respiratory variability, suggesting right atrial pressure of 3 mmHg.   Comparison(s): Prior images unable to be directly viewed, comparison made  by report only.   EKG:  EKG is not ordered today.    Recent Labs: No results found for requested labs within last 365 days.  Recent Lipid Panel    Component Value Date/Time   CHOL 146 10/27/2019 0813   TRIG 168 (H) 10/27/2019 0813   HDL 51 10/27/2019 0813   CHOLHDL 2.9 10/27/2019 0813   CHOLHDL 2.4 10/12/2015 0835   VLDL 28  10/12/2015 0835   LDLCALC 67 10/27/2019 0813     Risk Assessment/Calculations:           Physical Exam:    VS:  BP 110/60   Pulse 88   Ht 5' (1.524  m)   Wt 182 lb (82.6 kg)   SpO2 95%   BMI 35.54 kg/m     Wt Readings from Last 3 Encounters:  07/19/21 182 lb (82.6 kg)  01/10/21 183 lb 3.2 oz (83.1 kg)  02/25/20 188 lb 12.8 oz (85.6 kg)     GEN:  Well nourished, well developed in no acute distress HEENT: Normal NECK: No JVD; No carotid bruits LYMPHATICS: No lymphadenopathy CARDIAC: RRR, no murmurs, rubs, gallops RESPIRATORY:  Clear to auscultation without rales, wheezing or rhonchi  ABDOMEN: Soft, non-tender, non-distended MUSCULOSKELETAL:  No edema; No deformity  SKIN: Warm and dry NEUROLOGIC:  Alert and oriented x 3 PSYCHIATRIC:  Normal affect   ASSESSMENT:    1. Coronary artery disease involving native coronary artery of native heart without angina pectoris   2. Essential hypertension   3. Mixed hyperlipidemia   4. Stage 3a chronic kidney disease (HCC)    PLAN:    In order of problems listed above:  Stable without symptoms of angina.  Treated with aspirin, and ACE inhibitor, beta-blocker, and a high intensity statin drug.  Continue current treatment.  Follow-up 1 year. Blood pressure well controlled on current medicines.  I reviewed her recent labs.  Creatinine is stable at 1.32.  Potassium is elevated at 5.3.  I recommended that she discontinue potassium chloride.  No other medication changes are made today. Treated with a combination of Vascepa, nexlizet, and rosuvastatin.  LDL cholesterol 50 mg/dL, HDL 56, triglycerides 282.  Continue to work on lifestyle modification.  No medicine changes were made today. Treated with an ACE inhibitor.  Avoiding nephrotoxic agents.  Overall appears stable.           Medication Adjustments/Labs and Tests Ordered: Current medicines are reviewed at length with the patient today.  Concerns regarding medicines are outlined  above.  No orders of the defined types were placed in this encounter.  No orders of the defined types were placed in this encounter.   Patient Instructions  Medication Instructions:  STOP Potassium Chloride *If you need a refill on your cardiac medications before your next appointment, please call your pharmacy*   Lab Work: NONE If you have labs (blood work) drawn today and your tests are completely normal, you will receive your results only by: [place] (if you have MyChart) OR A paper copy in the mail If you have any lab test that is abnormal or we need to change your treatment, we will call you to review the results.   Testing/Procedures: NONE   Follow-Up: At [facility], you and your health needs are our priority.  As part of our continuing mission to provide you with exceptional heart care, we have created designated Provider Care Teams.  These Care Teams include your primary Cardiologist (physician) and Advanced Practice Providers (APPs -  Physician Assistants and Nurse Practitioners) who all work together to provide you with the care you need, when you need it.  Your next appointment:   1 year(s)  The format for your next appointment:   In Person  Provider:   Sherren Mocha, MD    Important Information About Sugar         Signed, Sherren Mocha, MD  07/19/2021 1:32 PM    [place]

## 2021-07-19 NOTE — Patient Instructions (Signed)
Medication Instructions:  STOP Potassium Chloride *If you need a refill on your cardiac medications before your next appointment, please call your pharmacy*   Lab Work: NONE If you have labs (blood work) drawn today and your tests are completely normal, you will receive your results only by: [place] (if you have MyChart) OR A paper copy in the mail If you have any lab test that is abnormal or we need to change your treatment, we will call you to review the results.   Testing/Procedures: NONE   Follow-Up: At [facility], you and your health needs are our priority.  As part of our continuing mission to provide you with exceptional heart care, we have created designated Provider Care Teams.  These Care Teams include your primary Cardiologist (physician) and Advanced Practice Providers (APPs -  Physician Assistants and Nurse Practitioners) who all work together to provide you with the care you need, when you need it.  Your next appointment:   1 year(s)  The format for your next appointment:   In Person  Provider:   Sherren Mocha, MD    Important Information About Sugar

## 2021-07-19 NOTE — Telephone Encounter (Signed)
Pt calling to speak with nurse or pharmacist regarding her applying for assistance through [employer]. Pt states that her grant expired last month. Please advise

## 2021-07-19 NOTE — Telephone Encounter (Signed)
Called patient who states that two years ago when she started on Nexlizet and Vascepa she was using a grant through [employer]. She forgot to renew at the foundation this year in April and it was over $300 for only the Nexlizet. She is now calling to see if/what we can do to help her. She states her member ID# [id number] was effective 05/24/20-05/23/21 ([email or web address]). Will route to PharmD for assistance.

## 2021-07-20 DIAGNOSIS — N183 Chronic kidney disease, stage 3 unspecified: Secondary | ICD-10-CM | POA: Diagnosis not present

## 2021-07-20 DIAGNOSIS — J309 Allergic rhinitis, unspecified: Secondary | ICD-10-CM | POA: Diagnosis not present

## 2021-07-20 DIAGNOSIS — G629 Polyneuropathy, unspecified: Secondary | ICD-10-CM | POA: Diagnosis not present

## 2021-07-20 DIAGNOSIS — Z0000 Encounter for general adult medical examination without abnormal findings: Secondary | ICD-10-CM | POA: Diagnosis not present

## 2021-07-20 DIAGNOSIS — R7303 Prediabetes: Secondary | ICD-10-CM | POA: Diagnosis not present

## 2021-07-20 DIAGNOSIS — E039 Hypothyroidism, unspecified: Secondary | ICD-10-CM | POA: Diagnosis not present

## 2021-07-20 DIAGNOSIS — I10 Essential (primary) hypertension: Secondary | ICD-10-CM | POA: Diagnosis not present

## 2021-07-20 MED ORDER — EZETIMIBE 10 MG PO TABS: 10.0000 mg | ORAL_TABLET | Freq: Every day | ORAL | 3 refills | Status: DC

## 2021-07-20 NOTE — Telephone Encounter (Signed)
Called and spoke with husband (per DPR). Explained the below information. I will call in zetia for patient to take until we can renew grant and get nexlizet. Will also need grant for vascepa.

## 2021-07-20 NOTE — Telephone Encounter (Signed)
Unfortunately the [employer] fund is currently closed and those 2 medications do not have assistance funds. I have placed pt on list to renew grant once it reopens.   Called pt and LVM for her to call back

## 2021-07-20 NOTE — Addendum Note (Signed)
Addended by: Marcelle Overlie D on: 07/20/2021 04:44 PM   Modules accepted: Orders

## 2021-07-21 MED ORDER — EZETIMIBE 10 MG PO TABS: 10.0000 mg | ORAL_TABLET | Freq: Every day | ORAL | 3 refills | Status: DC

## 2021-07-21 NOTE — Telephone Encounter (Signed)
Pt called back and with some questions. I reviewed below. She requested rx for ezetimibe [facility].

## 2021-07-21 NOTE — Addendum Note (Signed)
Addended by: Marcelle Overlie D on: 07/21/2021 10:19 AM   Modules accepted: Orders

## 2021-07-22 DIAGNOSIS — I10 Essential (primary) hypertension: Secondary | ICD-10-CM | POA: Diagnosis not present

## 2021-07-22 DIAGNOSIS — N183 Chronic kidney disease, stage 3 unspecified: Secondary | ICD-10-CM | POA: Diagnosis not present

## 2021-07-22 DIAGNOSIS — K219 Gastro-esophageal reflux disease without esophagitis: Secondary | ICD-10-CM | POA: Diagnosis not present

## 2021-07-22 DIAGNOSIS — E782 Mixed hyperlipidemia: Secondary | ICD-10-CM | POA: Diagnosis not present

## 2021-07-22 DIAGNOSIS — E039 Hypothyroidism, unspecified: Secondary | ICD-10-CM | POA: Diagnosis not present

## 2021-07-22 DIAGNOSIS — I2510 Atherosclerotic heart disease of native coronary artery without angina pectoris: Secondary | ICD-10-CM | POA: Diagnosis not present

## 2021-08-05 ENCOUNTER — Ambulatory Visit (HOSPITAL_COMMUNITY)
Admission: RE | Admit: 2021-08-05 | Discharge: 2021-08-05 | Disposition: A | Discharge status: Home / Self Care | Payer: Medicare Other | Source: Ambulatory Visit | Attending: Pulmonary Disease | Admitting: Pulmonary Disease

## 2021-08-05 DIAGNOSIS — R911 Solitary pulmonary nodule: Secondary | ICD-10-CM | POA: Insufficient documentation

## 2021-08-05 DIAGNOSIS — R918 Other nonspecific abnormal finding of lung field: Secondary | ICD-10-CM | POA: Diagnosis not present

## 2021-08-10 ENCOUNTER — Ambulatory Visit: Payer: Medicare Other | Admitting: Pulmonary Disease

## 2021-08-12 ENCOUNTER — Telehealth: Payer: Self-pay | Admitting: Cardiovascular Disease

## 2021-08-12 NOTE — Telephone Encounter (Signed)
Ramond Dial, RPH-CPP 07/20/21  4:43 PM Note Called and spoke with husband (per DPR). Explained the below information. I will call in zetia for patient to take until we can renew grant and get nexlizet. Will also need grant for vascepa.      Ramond Dial, RPH-CPP   07/20/21 10:52 AM Note Unfortunately the [employer] fund is currently closed and those 2 medications do not have assistance funds. I have placed pt on list to renew grant once it reopens.    Called pt and LVM for her to call back      We will attempt to get pt another grant once it opens back up for both nexlizet and vascepa. Pt has been placed on zetia in the interim, but will need another alternative for vascepa in the mean time. Will route to MD for review and consideration of Lovaza (alternate fish oil) that hopefully, will be covered by her primary insurance.

## 2021-08-12 NOTE — Telephone Encounter (Signed)
Pt c/o medication issue:  1. Name of Medication: Prescription fish oil  2. How are you currently taking this medication (dosage and times per day)?   3. Are you having a reaction (difficulty breathing--STAT)?   4. What is your medication issue? Pt states that she has run out of the [employer] assistance and would like advice as to what to take in place of this medicaiton

## 2021-08-14 NOTE — Telephone Encounter (Signed)
I'm ok with use of Lovaza until she is able to get Vascepa. thanks

## 2021-08-15 MED ORDER — OMEGA-3-ACID ETHYL ESTERS 1 G PO CAPS: 2.0000 g | ORAL_CAPSULE | Freq: Two times a day (BID) | ORAL | 11 refills | Status: DC

## 2021-08-15 NOTE — Telephone Encounter (Signed)
Prescription for Lovaza sent in to pharmacy on file. Called and spoke to patient. She understands to take this until Dalene Seltzer is available again through the foundation. I asked that she reach out to [facility] to see what her cost would be, and to let us know if she has any issues.

## 2021-08-16 ENCOUNTER — Encounter: Payer: Self-pay | Admitting: Pulmonary Disease

## 2021-08-16 ENCOUNTER — Ambulatory Visit: Payer: Medicare Other | Admitting: Pulmonary Disease

## 2021-08-16 VITALS — BP 126/70 | HR 88 | Temp 97.7°F | Ht 59.5 in | Wt 183.8 lb

## 2021-08-16 DIAGNOSIS — R918 Other nonspecific abnormal finding of lung field: Secondary | ICD-10-CM | POA: Diagnosis not present

## 2021-08-16 NOTE — Patient Instructions (Signed)
Recent CT imaging reviewed. Ground glass opacities stable/improved since 07/2018 with interval resolution of LUL nodule. Stable LLL lung nodule measuring 7 mm. Remains asymptomatic  Multiple lung nodules, ground glass  --Stable --No further imaging indicated  Follow-up as needed.

## 2021-08-16 NOTE — Progress Notes (Signed)
Subjective:   PATIENT ID: Gabrielle [name] GENDER: female DOB: 03-28-1945, MRN: [id number]   HPI  Chief Complaint  Patient presents with   Follow-up    Follow up    Reason for Visit: Follow-up for pulmonary nodule  Ms. Gabrielle [name] is a 76 year old female former smoker with multiple pulmonary nodules, CAD, GERD, HTN, HLD, hypothyroidism, CKD stage IIIa who presents for follow-up CT Chest.  Since our last visit she denies any respiratory issues. No shortness of breath, cough, wheezing or chest pain. Denies fevers, chills, unintentional weight loss, fatigue.  Social History: Quit in 1985. 20 pack years  Past Medical History:  Diagnosis Date   Anemia    Arthritis 01/17/2011   degenerative disc disease-all joints   Bursitis    CAD (coronary artery disease)    Cholecystitis with cholelithiasis 03/22/2018   Chronic kidney disease, stage 3a ([place])    GERD (gastroesophageal reflux disease) 01/17/2011   tx. omeprazole   Heart murmur    slight murmur   Hyperlipidemia    Hypertension 01/17/2011   tx. meds   Hypothyroidism    Leg pain    ABIs 2/18: normal bilaterally   Neuropathy    FEET   Osteoarthritis    Peripheral neuropathy    Pre-diabetes    Pulmonary nodule    Seasonal allergies    Sinus problem 01/17/2011   tx. Claritin D   ST elevation (STEMI) myocardial infarction involving left circumflex coronary artery (HCC) 08/21/2015     Allergies  Allergen Reactions   Pravastatin Other (See Comments)    Effects liver function   Nsaids     Other reaction(s): intol   Penicillins Other (See Comments)    Allergy as an infant - no other information available Has patient had a PCN reaction causing immediate rash, facial/tongue/throat swelling, SOB or lightheadedness with hypotension: Unknown Has patient had a PCN reaction causing severe rash involving mucus membranes or skin necrosis: Unknown Has patient had a PCN reaction that required hospitalization: No Has patient  had a PCN reaction occurring within the last 10 years: No If all of the above answers are "NO", then may proceed with Cephalospor   Pravastatin Sodium     Other reaction(s): elevated LFT's     Outpatient Medications Prior to Visit  Medication Sig Dispense Refill   acetaminophen (TYLENOL) 650 MG CR tablet Take 1,950 mg by mouth daily.     aspirin EC 81 MG tablet Take 81 mg by mouth at bedtime.      benazepril (LOTENSIN) 20 MG tablet Take 1 tablet (20 mg total) by mouth 2 (two) times daily. 180 tablet 3   Calcium Carbonate-Vitamin D (CALCIUM 600 + D PO) Take 1 tablet by mouth 2 (two) times daily.     Cholecalciferol (VITAMIN D) 2000 units CAPS Take 2,000 Units by mouth daily.     Cyanocobalamin (VITAMIN B-12 PO) Take 5,000 Units by mouth every other day.     docusate sodium (COLACE) 100 MG capsule Take 100 mg by mouth daily.     ezetimibe (ZETIA) 10 MG tablet Take 1 tablet (10 mg total) by mouth daily. 90 tablet 3   gabapentin (NEURONTIN) 600 MG tablet Take 600 mg by mouth 2 (two) times daily.     Glucosamine-Chondroit-Vit C-Mn (GLUCOSAMINE CHONDR 1500 COMPLX PO) Take 2 tablets by mouth daily. 1500/1200     levothyroxine (SYNTHROID, LEVOTHROID) 75 MCG tablet Take 75 mcg by mouth daily before breakfast.  loratadine-pseudoephedrine (CLARITIN-D 24-HOUR) 10-240 MG per 24 hr tablet Take 1 tablet by mouth every evening.     Melatonin 10 MG TABS Take 10 mg by mouth at bedtime.      metoprolol tartrate (LOPRESSOR) 25 MG tablet Take 0.5 tablets (12.5 mg total) by mouth 2 (two) times daily. 90 tablet 3   nitroGLYCERIN (NITROSTAT) 0.4 MG SL tablet Place 0.4 mg under the tongue every 5 (five) minutes as needed for chest pain.     NON FORMULARY Place 1 drop under the tongue daily. CBD Oil     omega-3 acid ethyl esters (LOVAZA) 1 g capsule Take 2 capsules (2 g total) by mouth 2 (two) times daily. 120 capsule 11   omeprazole (PRILOSEC) 20 MG capsule Take 20 mg by mouth daily with breakfast.      polyethylene glycol (MIRALAX / GLYCOLAX) packet Take 17 g by mouth daily as needed for mild constipation.      rosuvastatin (CRESTOR) 40 MG tablet TAKE 1 TABLET BY MOUTH EVERYDAY AT BEDTIME 90 tablet 3   triamterene-hydrochlorothiazide (MAXZIDE-25) 37.5-25 MG tablet 1 tablet in the morning     meclizine (ANTIVERT) 25 MG tablet Take 25 mg by mouth 3 (three) times daily as needed for dizziness. (Patient not taking: Reported on 08/16/2021)     Misc Natural Products (GLUCOSAMINE CHONDROITIN ADV) TABS 2 tablets (Patient not taking: Reported on 08/16/2021)     No facility-administered medications prior to visit.    Review of Systems  Constitutional:  Negative for chills, diaphoresis, fever, malaise/fatigue and weight loss.  HENT:  Negative for congestion.   Respiratory:  Negative for cough, hemoptysis, sputum production, shortness of breath and wheezing.   Cardiovascular:  Negative for chest pain, palpitations and leg swelling.     Objective:   Vitals:   08/16/21 1400  BP: 126/70  Pulse: 88  Temp: 97.7 F (36.5 C)  TempSrc: Oral  SpO2: 96%  Weight: 183 lb 12.8 oz (83.4 kg)  Height: 4' 11.5" (1.511 m)   SpO2: 96 %  Physical Exam: General: Well-appearing, no acute distress HENT: [place], AT Eyes: EOMI, no scleral icterus Respiratory: Clear to auscultation bilaterally.  No crackles, wheezing or rales Cardiovascular: RRR, -M/R/G, no JVD Extremities:-Edema,-tenderness Neuro: AAO x4, CNII-XII grossly intact Psych: Normal mood, normal affect  Data Reviewed:  Imaging: CT CAP 08/15/18 - 46m pulmonary nodule cluster in the LLL abutting the fissure. No mediastinal or hilar adenopathy noted. PET DOTATATE 01/30/20 - No evidence of well differentiated neuroendocrine tumor CT Chest 01/28/20 - Small ground glass nodules in LUL with largest measuring 858m No new lesions CT Chest 08/16/21 - Stable 6 mm pulmonary nodule in LLL. Resolved LUL nodule. Unchanged groundglass in LUL  PFT: None on  file  Labs: Tryptase 18.1 (UL 13.2) Chromagranin A 563.6 (UL 101.8) 24-hour catecholamine-within normal limits 24-hour normetanephrine-902 (UL 612)  CBC    Component Value Date/Time   WBC 6.8 01/01/2020 1049   WBC 6.8 05/28/2019 1012   RBC 4.01 01/01/2020 1049   HGB 12.3 01/01/2020 1049   HCT 38.0 01/01/2020 1049   PLT 211 01/01/2020 1049   MCV 94.8 01/01/2020 1049   MCH 30.7 01/01/2020 1049   MCHC 32.4 01/01/2020 1049   RDW 13.2 01/01/2020 1049   LYMPHSABS 1.6 01/01/2020 1049   MONOABS 0.5 01/01/2020 1049   EOSABS 0.6 (H) 01/01/2020 1049   BASOSABS 0.1 01/01/2020 1049   Absolute eos 01/01/20 - 600    Assessment & Plan:   Discussion: 75 year  old female with history pulmonary nodules and chronic GGO who presents for follow-up.  She was previously worked up for possible carcinoid tumor for elevated tryptase in chromogranin a levels. Carcinoid in the lung more commonly presents as endobronchial lesions but can present as peripheral lesions.  CT imaging had been reviewed and there are no symptoms or secondary findings to suggest endobronchial lesion (hemoptysis, wheezing, chronic cough, atelectasis, lung collapse.).    Recent CT imaging reviewed. Ground glass opacities stable/improved since 07/2018 with interval resolution of LUL nodule. Stable LLL lung nodule measuring 7 mm. Remains asymptomatic  Multiple lung nodules, ground glass  --Stable --No further imaging indicated  No orders of the defined types were placed in this encounter. No orders of the defined types were placed in this encounter.   Return if symptoms worsen or fail to improve.  I have spent a total time of 31-minutes on the day of the appointment including chart review, data review, collecting history, coordinating care and discussing medical diagnosis and plan with the patient/family. Past medical history, allergies, medications were reviewed. Pertinent imaging, labs and tests included in this note have been  reviewed and interpreted independently by me.  Bradley, MD [facility] 08/16/2021 2:06 PM  Office Number [phone]

## 2021-08-17 ENCOUNTER — Encounter: Payer: Self-pay | Admitting: Cardiovascular Disease

## 2021-08-18 DIAGNOSIS — H2513 Age-related nuclear cataract, bilateral: Secondary | ICD-10-CM | POA: Diagnosis not present

## 2021-09-02 ENCOUNTER — Encounter: Payer: Self-pay | Admitting: Pharmacist

## 2021-09-02 MED ORDER — ICOSAPENT ETHYL 1 G PO CAPS: 2.0000 g | ORAL_CAPSULE | Freq: Two times a day (BID) | ORAL | 3 refills | Status: DC

## 2021-09-02 MED ORDER — NEXLIZET 180-10 MG PO TABS: 1.0000 | ORAL_TABLET | Freq: Every day | ORAL | 3 refills | Status: DC

## 2021-09-13 DIAGNOSIS — J209 Acute bronchitis, unspecified: Secondary | ICD-10-CM | POA: Diagnosis not present

## 2021-09-13 DIAGNOSIS — B349 Viral infection, unspecified: Secondary | ICD-10-CM | POA: Diagnosis not present

## 2021-09-21 DIAGNOSIS — E782 Mixed hyperlipidemia: Secondary | ICD-10-CM | POA: Diagnosis not present

## 2021-09-21 DIAGNOSIS — I10 Essential (primary) hypertension: Secondary | ICD-10-CM | POA: Diagnosis not present

## 2021-09-21 DIAGNOSIS — I2510 Atherosclerotic heart disease of native coronary artery without angina pectoris: Secondary | ICD-10-CM | POA: Diagnosis not present

## 2021-09-21 DIAGNOSIS — E039 Hypothyroidism, unspecified: Secondary | ICD-10-CM | POA: Diagnosis not present

## 2021-10-07 ENCOUNTER — Encounter: Payer: Self-pay | Admitting: Cardiovascular Disease

## 2021-11-23 ENCOUNTER — Ambulatory Visit: Payer: Medicare Other | Admitting: Dermatology

## 2022-01-02 ENCOUNTER — Other Ambulatory Visit: Payer: Self-pay | Admitting: Cardiovascular Disease

## 2022-02-08 ENCOUNTER — Ambulatory Visit: Payer: Medicare Other | Admitting: Podiatry

## 2022-02-14 ENCOUNTER — Ambulatory Visit: Payer: Medicare Other | Admitting: Podiatry

## 2022-02-21 ENCOUNTER — Ambulatory Visit: Payer: Medicare Other | Admitting: Podiatry

## 2022-02-21 DIAGNOSIS — M216X1 Other acquired deformities of right foot: Secondary | ICD-10-CM

## 2022-02-21 NOTE — Progress Notes (Signed)
Subjective:  Patient ID: Gabrielle [name], female    DOB: Jun 25, 1945,  MRN: [id number]  Chief Complaint  Patient presents with   Foot Pain    77 y.o. female presents with the above complaint.  Patient presents with right cavovarus foot type with weakness of the peroneal tendon.  Patient states the foot has been slightly getting inverted and is causing her some submetatarsal fifth metatarsal base pain.  She states she went to get it evaluated there is some flexibility to it.  She has not seen anyone else prior to seeing me for this.  Denies any other acute complaints.  Would like to discuss treatment options for this   Review of Systems: Negative except as noted in the HPI. Denies N/V/F/Ch.  Past Medical History:  Diagnosis Date   Anemia    Arthritis 01/17/2011   degenerative disc disease-all joints   Bursitis    CAD (coronary artery disease)    Cholecystitis with cholelithiasis 03/22/2018   Chronic kidney disease, stage 3a (HCC)    GERD (gastroesophageal reflux disease) 01/17/2011   tx. omeprazole   Heart murmur    slight murmur   Hyperlipidemia    Hypertension 01/17/2011   tx. meds   Hypothyroidism    Leg pain    ABIs 2/18: normal bilaterally   Neuropathy    FEET   Osteoarthritis    Peripheral neuropathy    Pre-diabetes    Pulmonary nodule    Seasonal allergies    Sinus problem 01/17/2011   tx. Claritin D   ST elevation (STEMI) myocardial infarction involving left circumflex coronary artery ([place]) 08/21/2015    Current Outpatient Medications:    acetaminophen (TYLENOL) 650 MG CR tablet, Take 1,950 mg by mouth daily., Disp: , Rfl:    aspirin EC 81 MG tablet, Take 81 mg by mouth at bedtime. , Disp: , Rfl:    Bempedoic Acid-Ezetimibe (NEXLIZET) 180-10 MG TABS, Take 1 tablet by mouth daily., Disp: 90 tablet, Rfl: 3   benazepril (LOTENSIN) 20 MG tablet, Take 1 tablet (20 mg total) by mouth 2 (two) times daily., Disp: 180 tablet, Rfl: 3   Calcium Carbonate-Vitamin D  (CALCIUM 600 + D PO), Take 1 tablet by mouth 2 (two) times daily., Disp: , Rfl:    Cholecalciferol (VITAMIN D) 2000 units CAPS, Take 2,000 Units by mouth daily., Disp: , Rfl:    Cyanocobalamin (VITAMIN B-12 PO), Take 5,000 Units by mouth every other day., Disp: , Rfl:    docusate sodium (COLACE) 100 MG capsule, Take 100 mg by mouth daily., Disp: , Rfl:    gabapentin (NEURONTIN) 600 MG tablet, Take 600 mg by mouth 2 (two) times daily., Disp: , Rfl:    Glucosamine-Chondroit-Vit C-Mn (GLUCOSAMINE CHONDR 1500 COMPLX PO), Take 2 tablets by mouth daily. 1500/1200, Disp: , Rfl:    icosapent Ethyl (VASCEPA) 1 g capsule, Take 2 capsules (2 g total) by mouth 2 (two) times daily., Disp: 360 capsule, Rfl: 3   levothyroxine (SYNTHROID, LEVOTHROID) 75 MCG tablet, Take 75 mcg by mouth daily before breakfast., Disp: , Rfl:    loratadine-pseudoephedrine (CLARITIN-D 24-HOUR) 10-240 MG per 24 hr tablet, Take 1 tablet by mouth every evening., Disp: , Rfl:    meclizine (ANTIVERT) 25 MG tablet, Take 25 mg by mouth 3 (three) times daily as needed for dizziness. (Patient not taking: Reported on 08/16/2021), Disp: , Rfl:    Melatonin 10 MG TABS, Take 10 mg by mouth at bedtime. , Disp: , Rfl:    metoprolol  tartrate (LOPRESSOR) 25 MG tablet, Take 0.5 tablets (12.5 mg total) by mouth 2 (two) times daily., Disp: 90 tablet, Rfl: 3   Misc Natural Products (GLUCOSAMINE CHONDROITIN ADV) TABS, 2 tablets (Patient not taking: Reported on 08/16/2021), Disp: , Rfl:    nitroGLYCERIN (NITROSTAT) 0.4 MG SL tablet, Place 0.4 mg under the tongue every 5 (five) minutes as needed for chest pain., Disp: , Rfl:    NON FORMULARY, Place 1 drop under the tongue daily. CBD Oil, Disp: , Rfl:    omeprazole (PRILOSEC) 20 MG capsule, Take 20 mg by mouth daily with breakfast., Disp: , Rfl:    polyethylene glycol (MIRALAX / GLYCOLAX) packet, Take 17 g by mouth daily as needed for mild constipation. , Disp: , Rfl:    rosuvastatin (CRESTOR) 40 MG tablet, TAKE  1 TABLET BY MOUTH EVERYDAY AT BEDTIME, Disp: 90 tablet, Rfl: 1   triamterene-hydrochlorothiazide (MAXZIDE-25) 37.5-25 MG tablet, 1 tablet in the morning, Disp: , Rfl:   Social History   Tobacco Use  Smoking Status Former   Packs/day: 1.00   Years: 30.00   Total pack years: 30.00   Types: Cigarettes   Start date: 69   Quit date: 08/13/1993   Years since quitting: 28.5  Smokeless Tobacco Never    Allergies  Allergen Reactions   Pravastatin Other (See Comments)    Effects liver function   Nsaids     Other reaction(s): intol   Penicillins Other (See Comments)    Allergy as an infant - no other information available Has patient had a PCN reaction causing immediate rash, facial/tongue/throat swelling, SOB or lightheadedness with hypotension: Unknown Has patient had a PCN reaction causing severe rash involving mucus membranes or skin necrosis: Unknown Has patient had a PCN reaction that required hospitalization: No Has patient had a PCN reaction occurring within the last 10 years: No If all of the above answers are "NO", then may proceed with Cephalospor   Pravastatin Sodium     Other reaction(s): elevated LFT's   Objective:  There were no vitals filed for this visit. There is no height or weight on file to calculate BMI. Constitutional Well developed. Well nourished.  Vascular Dorsalis pedis pulses palpable bilaterally. Posterior tibial pulses palpable bilaterally. Capillary refill normal to all digits.  No cyanosis or clubbing noted. Pedal hair growth normal.  Neurologic Normal speech. Oriented to person, place, and time. Epicritic sensation to light touch grossly present bilaterally.  Dermatologic Nails well groomed and normal in appearance. No open wounds. No skin lesions.  Orthopedic: No pain on palpation along the course of the peroneal tendon.  Generalized weakness noted of the peroneal strength 3 out of 5.  Cavovarus foot structure noted likely due to the weakening  of the peroneal tendons.  No pain on palpation along the course of the posterior tibial tendon.   Radiographs: None Assessment:   1. Cavovarus deformity of foot, acquired, right    Plan:  Patient was evaluated and treated and all questions answered.  Right cavovarus foot type with weakness of the peroneal tendon -All questions and concerns were discussed with the patient in extensive detail -Given the semiflexible erythema patient may benefit from Tri-Lock ankle brace if it is helpful with ambulating patient will benefit from a rigid bracing to prevent cavovarus foot structure.  I discussed with patient she states understanding -We will plan on an AFO brace next time if there is an improvement with ankle brace  No follow-ups on file.   Right weakening  of the peroneal tendons cavovarus foot type noted.  Tri-Lock ankle brace.  Semiflexible

## 2022-03-13 DIAGNOSIS — M2170 Unequal limb length (acquired), unspecified site: Secondary | ICD-10-CM | POA: Diagnosis not present

## 2022-03-13 DIAGNOSIS — N1832 Chronic kidney disease, stage 3b: Secondary | ICD-10-CM | POA: Diagnosis not present

## 2022-03-13 DIAGNOSIS — G629 Polyneuropathy, unspecified: Secondary | ICD-10-CM | POA: Diagnosis not present

## 2022-03-13 DIAGNOSIS — R7303 Prediabetes: Secondary | ICD-10-CM | POA: Diagnosis not present

## 2022-04-04 ENCOUNTER — Ambulatory Visit: Payer: Medicare Other | Admitting: Podiatry

## 2022-04-04 DIAGNOSIS — M216X1 Other acquired deformities of right foot: Secondary | ICD-10-CM | POA: Diagnosis not present

## 2022-04-04 NOTE — Progress Notes (Signed)
Subjective:  Patient ID: Gabrielle [name], female    DOB: [birth year],  MRN: [id number]  Chief Complaint  Patient presents with   Foot Pain    Pt stated that she is still having a lot of foot discomfort     77 y.o. female presents with the above complaint.  Patient Gabrielle [name] for follow-up to right cavovarus foot type semiflexible with peroneal tendon weakness.  She states bracing helped some but did not go over to the edition she comes out of the brace and goes into varus foot structure.   Review of Systems: Negative except as noted in the HPI. Denies N/V/F/Ch.  Past Medical History:  Diagnosis Date   Anemia    Arthritis 01/17/2011   degenerative disc disease-all joints   Bursitis    CAD (coronary artery disease)    Cholecystitis with cholelithiasis 03/22/2018   Chronic kidney disease, stage 3a ([facility])    GERD (gastroesophageal reflux disease) 01/17/2011   tx. omeprazole   Heart murmur    slight murmur   Hyperlipidemia    Hypertension 01/17/2011   tx. meds   Hypothyroidism    Leg pain    ABIs 2/18: normal bilaterally   Neuropathy    FEET   Osteoarthritis    Peripheral neuropathy    Pre-diabetes    Pulmonary nodule    Seasonal allergies    Sinus problem 01/17/2011   tx. Claritin D   ST elevation (STEMI) myocardial infarction involving left circumflex coronary artery ([place]) 08/21/2015    Current Outpatient Medications:    acetaminophen (TYLENOL) 650 MG CR tablet, Take 1,950 mg by mouth daily., Disp: , Rfl:    aspirin EC 81 MG tablet, Take 81 mg by mouth at bedtime. , Disp: , Rfl:    Bempedoic Acid-Ezetimibe (NEXLIZET) 180-10 MG TABS, Take 1 tablet by mouth daily., Disp: 90 tablet, Rfl: 3   benazepril (LOTENSIN) 20 MG tablet, Take 1 tablet (20 mg total) by mouth 2 (two) times daily., Disp: 180 tablet, Rfl: 3   Calcium Carbonate-Vitamin D (CALCIUM 600 + D PO), Take 1 tablet by mouth 2 (two) times daily., Disp: , Rfl:    Cholecalciferol (VITAMIN D) 2000 units CAPS, Take 2,000  Units by mouth daily., Disp: , Rfl:    Cyanocobalamin (VITAMIN B-12 PO), Take 5,000 Units by mouth every other day., Disp: , Rfl:    docusate sodium (COLACE) 100 MG capsule, Take 100 mg by mouth daily., Disp: , Rfl:    gabapentin (NEURONTIN) 600 MG tablet, Take 600 mg by mouth 2 (two) times daily., Disp: , Rfl:    Glucosamine-Chondroit-Vit C-Mn (GLUCOSAMINE CHONDR 1500 COMPLX PO), Take 2 tablets by mouth daily. 1500/1200, Disp: , Rfl:    icosapent Ethyl (VASCEPA) 1 g capsule, Take 2 capsules (2 g total) by mouth 2 (two) times daily., Disp: 360 capsule, Rfl: 3   levothyroxine (SYNTHROID, LEVOTHROID) 75 MCG tablet, Take 75 mcg by mouth daily before breakfast., Disp: , Rfl:    loratadine-pseudoephedrine (CLARITIN-D 24-HOUR) 10-240 MG per 24 hr tablet, Take 1 tablet by mouth every evening., Disp: , Rfl:    meclizine (ANTIVERT) 25 MG tablet, Take 25 mg by mouth 3 (three) times daily as needed for dizziness. (Patient not taking: Reported on 08/16/2021), Disp: , Rfl:    Melatonin 10 MG TABS, Take 10 mg by mouth at bedtime. , Disp: , Rfl:    metoprolol tartrate (LOPRESSOR) 25 MG tablet, Take 0.5 tablets (12.5 mg total) by mouth 2 (two) times daily., Disp: 90 tablet,  Rfl: 3   Misc Natural Products (GLUCOSAMINE CHONDROITIN ADV) TABS, 2 tablets (Patient not taking: Reported on 08/16/2021), Disp: , Rfl:    nitroGLYCERIN (NITROSTAT) 0.4 MG SL tablet, Place 0.4 mg under the tongue every 5 (five) minutes as needed for chest pain., Disp: , Rfl:    NON FORMULARY, Place 1 drop under the tongue daily. CBD Oil, Disp: , Rfl:    omeprazole (PRILOSEC) 20 MG capsule, Take 20 mg by mouth daily with breakfast., Disp: , Rfl:    polyethylene glycol (MIRALAX / GLYCOLAX) packet, Take 17 g by mouth daily as needed for mild constipation. , Disp: , Rfl:    rosuvastatin (CRESTOR) 40 MG tablet, TAKE 1 TABLET BY MOUTH EVERYDAY AT BEDTIME, Disp: 90 tablet, Rfl: 1   triamterene-hydrochlorothiazide (MAXZIDE-25) 37.5-25 MG tablet, 1 tablet  in the morning, Disp: , Rfl:   Social History   Tobacco Use  Smoking Status Former   Packs/day: 1.00   Years: 30.00   Total pack years: 30.00   Types: Cigarettes   Start date: 58   Quit date: 08/13/1993   Years since quitting: 28.6  Smokeless Tobacco Never    Allergies  Allergen Reactions   Pravastatin Other (See Comments)    Effects liver function   Nsaids     Other reaction(s): intol   Penicillins Other (See Comments)    Allergy as an infant - no other information available Has patient had a PCN reaction causing immediate rash, facial/tongue/throat swelling, SOB or lightheadedness with hypotension: Unknown Has patient had a PCN reaction causing severe rash involving mucus membranes or skin necrosis: Unknown Has patient had a PCN reaction that required hospitalization: No Has patient had a PCN reaction occurring within the last 10 years: No If all of the above answers are "NO", then may proceed with Cephalospor   Pravastatin Sodium     Other reaction(s): elevated LFT's   Objective:  There were no vitals filed for this visit. There is no height or weight on file to calculate BMI. Constitutional Well developed. Well nourished.  Vascular Dorsalis pedis pulses palpable bilaterally. Posterior tibial pulses palpable bilaterally. Capillary refill normal to all digits.  No cyanosis or clubbing noted. Pedal hair growth normal.  Neurologic Normal speech. Oriented to person, place, and time. Epicritic sensation to light touch grossly present bilaterally.  Dermatologic Nails well groomed and normal in appearance. No open wounds. No skin lesions.  Orthopedic: No pain on palpation along the course of the peroneal tendon.  Generalized weakness noted of the peroneal strength 3 out of 5.  Cavovarus foot structure noted likely due to the weakening of the peroneal tendons.  No pain on palpation along the course of the posterior tibial tendon.   Radiographs: None Assessment:   No  diagnosis found.  Plan:  Patient was evaluated and treated and all questions answered.  Right cavovarus foot type with weakness of the peroneal tendon -All questions and concerns were discussed with the patient in extensive detail -Tri-Lock ankle brace gave her some stability while ambulating therefore I believe she will benefit from rigid type of bracing such as AFO/medial bracing.  I discussed with the patient she states understand would like to proceed with bracing -I gave her a [facility] prescription for custom bracing.  No follow-ups on file.

## 2022-05-15 ENCOUNTER — Ambulatory Visit: Payer: Medicare Other | Admitting: Neurology

## 2022-05-15 ENCOUNTER — Encounter: Payer: Self-pay | Admitting: Neurology

## 2022-05-15 VITALS — BP 146/70 | HR 82 | Ht 59.5 in | Wt 193.0 lb

## 2022-05-15 DIAGNOSIS — E531 Pyridoxine deficiency: Secondary | ICD-10-CM | POA: Diagnosis not present

## 2022-05-15 DIAGNOSIS — E538 Deficiency of other specified B group vitamins: Secondary | ICD-10-CM

## 2022-05-15 DIAGNOSIS — G609 Hereditary and idiopathic neuropathy, unspecified: Secondary | ICD-10-CM | POA: Diagnosis not present

## 2022-05-15 DIAGNOSIS — E5111 Dry beriberi: Secondary | ICD-10-CM

## 2022-05-15 DIAGNOSIS — R209 Unspecified disturbances of skin sensation: Secondary | ICD-10-CM

## 2022-05-15 DIAGNOSIS — L539 Erythematous condition, unspecified: Secondary | ICD-10-CM

## 2022-05-15 DIAGNOSIS — Z87891 Personal history of nicotine dependence: Secondary | ICD-10-CM

## 2022-05-15 MED ORDER — ALPHA-LIPOIC ACID 600 MG PO CAPS: 600.0000 mg | ORAL_CAPSULE | Freq: Two times a day (BID) | ORAL | Status: DC

## 2022-05-15 NOTE — Patient Instructions (Addendum)
- Discussed proprioception and keeping lights on to avoid falls - She has many risk factors for peripheral neuropathy: long-term pre-diabetes, long-term B12 deficiency, 2-3 glasses of wine a night - Finish panel of blood work for other causes - Also had severe lumbar spinal stenosis which could have caused nerve damage - Ask about mounjaro to Dr. Tenny Craw also wegovy; Weight loss drug Wegovy gains FDA approval to reduce heart disease risk. On March 8, the FDA approved the use of Wegovy to reduce the risk of cardiovascular death, heart attack, and stroke in adults with heart disease with obesity or overweight. -May consider daily alpha lipoic acid which is an antioxidant that may reduce free radical oxidative stress associated with diabetic polyneuropathy, existing evidence suggests that alpha lipoic acid significantly reduces stabbing, lancinating and burning pain and diabetic neuropathy with its onset of action as early as 1-2 weeks. 600mg  1-2x a day. Watch for B1 and thyroid.  -emg/ncs to rule out other causes like CIDP (chronic inflammatory demyelinating polyneuropathy) - 80 panel free genetic testing includes familial amyloidosis - She may have PAD hasn't been check, feet are cold to the touch, rubor on dependency, smoked for years, will recheck ABI    Peripheral Neuropathy Peripheral neuropathy is a type of nerve damage. It affects nerves that carry signals between the spinal cord and the arms, legs, and the rest of the body (peripheral nerves). It does not affect nerves in the spinal cord or brain. In peripheral neuropathy, one nerve or a group of nerves may be damaged. Peripheral neuropathy is a broad category that includes many specific nerve disorders, like diabetic neuropathy, hereditary neuropathy, and carpal tunnel syndrome. What are the causes? This condition may be caused by: Certain diseases, such as: Diabetes. This is the most common cause of peripheral neuropathy. Autoimmune diseases,  such as rheumatoid arthritis and systemic lupus erythematosus. Nerve diseases that are passed from parent to child (inherited). Kidney disease. Thyroid disease. Other causes may include: Nerve injury. Pressure or stress on a nerve that lasts a long time. Lack (deficiency) of B vitamins. This can result from alcoholism, poor diet, or a restricted diet. Infections. Some medicines, such as cancer medicines (chemotherapy). Poisonous (toxic) substances, such as lead and mercury. Too little blood flowing to the legs. Obesity Peripheral artery disease Chronic venous insufficiency Regular alcohol use In some cases, the cause of this condition is not known. What are the signs or symptoms? Symptoms of this condition depend on which of your nerves is damaged. Symptoms in the legs, hands, and arms can include: Loss of feeling (numbness) in the feet, hands, or both. Tingling in the feet, hands, or both. Burning pain. Very sensitive skin. Weakness. Not being able to move a part of the body (paralysis). Clumsiness or poor coordination. Muscle twitching. Loss of balance. Symptoms in other parts of the body can include: Not being able to control your bladder. Feeling dizzy. Sexual problems. How is this diagnosed? Diagnosing and finding the cause of peripheral neuropathy can be difficult. Your health care provider will take your medical history and do a physical exam. A neurological exam will also be done. This involves checking things that are affected by your brain, spinal cord, and nerves (nervous system). For example, your health care provider will check your reflexes, how you move, and what you can feel. You may have other tests, such as: Blood tests. Electromyogram (EMG) and nerve conduction tests. These tests check nerve function and how well the nerves are controlling the muscles.  Imaging tests, such as a CT scan or MRI, to rule out other causes of your symptoms. Removing a small piece  of nerve to be examined in a lab (nerve biopsy). Removing and examining a small amount of the fluid that surrounds the brain and spinal cord (lumbar puncture). How is this treated? Treatment for this condition may involve: Treating the underlying cause of the neuropathy, such as diabetes, kidney disease, or vitamin deficiencies. Stopping medicines that can cause neuropathy, such as chemotherapy. Medicine to help relieve pain. Medicines may include: Prescription or over-the-counter pain medicine. Anti-seizure medicine. Antidepressants. Pain-relieving patches that are applied to painful areas of skin. Surgery to relieve pressure on a nerve or to destroy a nerve that is causing pain. Physical therapy to help improve movement and balance. Devices to help you move around (assistive devices). Follow these instructions at home: Medicines Take over-the-counter and prescription medicines only as told by your health care provider. Do not take any other medicines without first asking your health care provider. Ask your health care provider if the medicine prescribed to you requires you to avoid driving or using machinery. Lifestyle  Do not use any products that contain nicotine or tobacco. These products include cigarettes, chewing tobacco, and vaping devices, such as e-cigarettes. Smoking keeps blood from reaching damaged nerves. If you need help quitting, ask your health care provider. Avoid or limit alcohol. Too much alcohol can cause a vitamin B deficiency, and vitamin B is needed for healthy nerves. Eat a healthy diet. This includes: Eating foods that are high in fiber, such as beans, whole grains, and fresh fruits and vegetables. Limiting foods that are high in fat and processed sugars, such as fried or sweet foods. General instructions  If you have diabetes, work closely with your health care provider to keep your blood sugar under control. If you have numbness in your feet: Check every day  for signs of injury or infection. Watch for redness, warmth, and swelling. Wear padded socks and comfortable shoes. These help protect your feet. Develop a good support system. Living with peripheral neuropathy can be stressful. Consider talking with a mental health specialist or joining a support group. Use assistive devices and attend physical therapy as told by your health care provider. This may include using a walker or a cane. Keep all follow-up visits. This is important. Where to find more information [employer] of Neurological Disorders: [employer] Contact a health care provider if: You have new signs or symptoms of peripheral neuropathy. You are struggling emotionally from dealing with peripheral neuropathy. Your pain is not well controlled. Get help right away if: You have an injury or infection that is not healing normally. You develop new weakness in an arm or leg. You have fallen or do so frequently. Summary Peripheral neuropathy is when the nerves in the arms or legs are damaged, resulting in numbness, weakness, or pain. There are many causes of peripheral neuropathy, including diabetes, pinched nerves, vitamin deficiencies, autoimmune disease, and hereditary conditions. Diagnosing and finding the cause of peripheral neuropathy can be difficult. Your health care provider will take your medical history, do a physical exam, and do tests, including blood tests and nerve function tests. Treatment involves treating the underlying cause of the neuropathy and taking medicines to help control pain. Physical therapy and assistive devices may also help. This information is not intended to replace advice given to you by your health care provider. Make sure you discuss any questions you have with your health care  provider. Peripheral Vascular Disease You will learn what peripheral vascular disease is, how it affects your body, signs and symptoms to watch for, and how the  condition is treated. To view the content, go to this web address: [email or web address]  This video will expire on: 01/12/2024. If you need access to this video following this date, please reach out to the healthcare provider who assigned it to you. This information is not intended to replace advice given to you by your health care provider. Make sure you discuss any questions you have with your health care provider. [employer] Patient Education  2023 [employer]  Alpha-lipoic acid Dihydrolipoic acid; Lipoic acid; Lipolate; Thiotic acid  Alpha-lipoic acid is an antioxidant made by the body. It is found in every cell, where it helps turn glucose into energy. Antioxidants attack "free radicals," waste products created when the body turns food into energy. Free radicals cause harmful chemical reactions that can damage cells, making it harder for the body to fight off infections. They also damage organs and tissues.  Other antioxidants work only in water (such as vitamin C) or fatty tissues (such as vitamin E). But alpha-lipoic acid is both fat and water soluble. That means it can work throughout the body. Antioxidants in the body are used up as they attack free radicals. But evidence suggests alpha-lipoic acid may help regenerate these other antioxidants and make them active again.  In the cells of the body, alpha-lipoic acid is changed into dihydrolipoic acid. Alpha-lipoic acid is not the same as alpha linolenic acid, which is an omega-3 fatty acid that may help heart health. There is confusion between alpha-lipoic acid and alpa linolenic acid because both are sometimes abbreviated ALA. Alpha-lipoic acid is also sometimes called lipoic acid.  Diabetes Several studies suggest alpha-lipoic acid helps lower blood sugar levels. Its ability to kill free radicals may help people with diabetic peripheral neuropathy, who have pain, burning, itching, tingling, and numbness in arms and legs from  nerve damage. Researchers believe Alpha-lipoic acid helps improve insulin sensitivity.  Alpha-lipoic acid has been used for years to treat peripheral neuropathy in [place]. However, most of the studies that have found it helps have used intravenous (IV) alpha-lipoic acid. It's not clear whether taking alpha-lipoic acid by mouth will help. Most studies of oral alpha-lipoic acid have been small and poorly designed. One study did find that taking alpha-lipoic acid for diabetic neuropathy reduced symptoms compared to placebo.  Taking alpha-lipoic acid may help another diabetes-related condition called autonomic neuropathy, which affects the nerves to internal organs. One study of 38 people with cardiac autonomic neuropathy, which affects the heart, found that subjects reported fewer signs of the condition when taking 800 mg of alpha-lipoic acid orally compared to placebo.  Brain Function and Stroke Because alpha-lipoic acid can pass easily into the brain, it may help protect the brain and nerve tissue. Researchers are investigating it as a potential treatment for stroke and other brain problems involving free radical damage, such as dementia. So far, there's no evidence to say whether or not it works.  Other Preliminary studies suggest alpha-lipoic acid may help treat glaucoma. But there is not enough evidence to say for sure whether it works. In one study on aging skin, a cream with 5% lipoic acid helped reduce fine lines from sun damage. Studies show ALA binds with toxic metals, such as mercury, arsenic, iron, and other metals that act as free radicals. Preliminary studies also suggest that ALA may play a role in  managing other conditions including erectile dysfunction and cancer. And preliminary studies suggest it may reduce complications associated with otitis media (ear infections).  Dietary Sources If you are healthy, your body makes enough alpha-lipoic acid. It is also found in red meat, organ meats  (such as liver), and yeast -- particularly brewer's yeast.  Available Forms Alpha-lipoic acid supplements are available as capsules. Your health care provider can also give it by injection.  How to Take It Pediatric Alpha-lipoic acid has not been studied in children, so it is not recommended for pediatric use.  Adult Check with your doctor regarding dosing recommendations. Studies are mixed about whether or not to take ALA with meals.  Precautions Because of the potential for side effects and interactions with medications, you should take dietary supplements only under the supervision of a health care provider.  Alpha-lipoic acid hasn't been studied in pregnant or breastfeeding women, so researchers don't know if it's safe.  Side effects are generally rare and may include insomnia, fatigue, diarrhea, and skin rash.  Alpha-lipoic acid can lower blood sugar levels, so people with diabetes or low blood sugar should take alpha-lipoic acid only under the supervision of their health care provider.  Animal studies suggest that people who don't get enough thiamine (vitamin B1) should not take alpha-lipoic acid. B1 deficiency is associated with long-term alcohol abuse.  Possible Interactions If you are being treated with any of the following medications, you should not use alpha-lipoic acid without first talking to your health care provider.  Medications for diabetes Apha-lipoic acid can combine with these drugs to lower blood sugar levels, raising the risk of hypoglycemia or low blood sugar. Ask your provider before taking alpha-lipoic acid, and watch your blood sugar levels closely. Your provider may need to adjust your medication doses.  Chemotherapy medications Alpha-lipoic acid may interfere with some chemotherapy medications. Always ask your oncologist before taking any herb or supplement, including alpha-lipoic acid.  Thyroid medications, Levothyroxine Apha-lipoic acid may lower levels  of thyroid hormone. Your provider should monitor blood hormone levels and thyroid function tests closely.  Vitamin B1 (Thiamine) Alpha lipoic acid can lower the level of vitamin B1 (Thiamine) in the body. This can be particularly dangerous in alcoholics where malnutrition is often already present. Document Revised: 09/21/2020 Document Reviewed: 09/21/2020 Else[employer] Patient Education  2023 [employer].

## 2022-05-15 NOTE — Progress Notes (Addendum)
[facility]    Provider:  Dr Lucia Gaskins Requesting Provider: Daisy Floro, MD Primary Care Provider:  Daisy Floro, MD  Addendum 05-21-2022: ABI returned "toe brachial index abnormal":  Forarded via EPIC  results to Dr. Tenny Craw due to "toe brachial index abnormal": Dr. Tenny Craw, not sure what to do with this so bouncing back to you. I did place a consult for: "AMbulatory referral for peripheral vascular consult" but it seems to go to cadiovascular if this is not acurate please adjust not sure of she is beginning to get PAD.  ABI:  Summary:  Right: Resting right ankle-brachial index is within normal range. The  right toe-brachial index is abnormal.   Left: Resting left ankle-brachial index is within normal range. The left  toe-brachial index is abnormal.   CC:  neuropathy  HPI:  Gabrielle [name] is a 77 y.o. female here as requested by Daisy Floro, MD for neuropathy. PMHx htn, hld, hypona, peripheral neuropathy, ckd, anemia, prediabetes, CAD, psteopenia, b12 deficiency, hx of MI, hypothyroid, lumbar spondylosis, obesity, ckd stage 3, gerd, osteopenia, on gabapentin and Vt B12.   Per notes, circulation checked and is ok. She was evaluated in 2014 by neurology for bilateral foot numbness and tingling since 2009 and then in 2018 with me for lumbar radiculpathy. Review of chart Patient has a history of lumbar radiculopathy status post surgery 2013 L4-L5 lumbar decompression and fusion. At that time she was having bilateral severe hip and sciatic pain which did improve following surgery. Pain in the feet has not significantly changed since lumbar spine surgery. She drinks 2-3 glasses of wine on a daily basis for years. Exam showed decreased pinprick, temperature in the toes, vibration less than 5 seconds at the toes with intact proprioception. Reflexes were trace in the uppers and absent in the lowers. Romberg was positive. The neuropathy lab was ordered, she was advised to  cut down on her alcohol, started daily multivitamin, and a trial of compounded neuropathy cream.   She is here today for neuropathy, husband provides much information, I saw her last 2018 for lumbar radic severe spinal stenosis was the outcome s/p decompression surgery. Neuropathy in the feet worsening now up to the knees. Here woth husband who also provides information. She has numbness, has to look down to see where feet are in space. Stability impaired. Started in 2019 and slowly progressed towards the knees. Slowly radiating up, numbness, tingling in the feet, pain in the feet, not associated wit low back, slowly progressive, no weakness, worse first thing in the morning until she gets dressed and down the steps and takes tylenol and gabapentin. Gabapentin has helped but has not been increased over the years. She has lots of lymphedema in the last 6 months but worsening changes in feet have been ongoing slowly continuously slowly up the legs. She uses compression anklets. Makes her feet feel better. She has cramping in the right leg. Doesn't hurt worse in bed doesn't hurt to have sheets touch the feet, she wears socks, very imbalanced. She has had several low back surgeries and has lots of sciatica and not an everydat occurrence. Numbness in the feet continuous and progressive, no falls, almost. No blood relatives. 1/2 bottle of wine a night 2-3 glasses every night. Oral supplements Her legs are swollen with dependent rubor and fel cold. No other focal neurologic deficits, associated symptoms, inciting events or modifiable factors.    Reviewed notes, labs and imaging from outside physicians, which showed:  07/08/2021 Hgba1c 6 Cbc nml, cmp bun 38 creat 1.32, b12 1456, tsh 1.880 B12 was 455 01/12/2021 07/12/2018 b12 205 9 years ago 279 has been getting B12 injections and now oral.  Tsh 1.8 nml 06/30/2020 B12 179   Mr l spine 2018:   IMPRESSION:  Abnormal MRI lumbar spine (without) demonstrating: 1.  At L3-4: disc bulging, facet hypertrophy, posterior epidural lipomatosis, with severe spinal stenosis and moderate biforaminal stenosis; left sided pedicle screw. 2. At L2-3: disc bulging, facet hypertrophy, posterior epidural lipomatosis, with moderate spinal stenosis and mild biforaminal stenosis.    saw her in 2018 : reviewed my plan and outcomes  Assessment/Plan from 6875:  77 year old female with bilateral upper leg pain and hip flexion weakness with gait difficulty.   MRI lumbar spine to evaluate for lumbar stenosis, neurogenic claudication and radiculopathy for surgical evaluation or ESI.: Had severe multilevel stenosis and had surgery. Possibly emg/ncs if further investigation warranted after MRI l-spine Physical therapy (DDx lumbar radic, meralgia paresthetica) Increase Gabapentin Recommend epidural steroid injections vs lateral femoral cutaneous nerve blocks pending results of MRI lumbar spine Gave patient stretching exercises for Meralgia Paresthetica Check B12 and MMA - found b12 def and informed patient and treated  Review of Systems: Patient complains of symptoms per HPI as well as the following symptoms numbness. Pertinent negatives and positives per HPI. All others negative.   Social History   Socioeconomic History   Marital status: Married    Spouse name: Not on file   Number of children: 3   Years of education: BSN+   Highest education level: Not on file  Occupational History   Occupation: Retired  Tobacco Use   Smoking status: Former    Packs/day: 1.00    Years: 30.00    Additional pack years: 0.00    Total pack years: 30.00    Types: Cigarettes    Start date: 1965    Quit date: 08/13/1993    Years since quitting: 28.7   Smokeless tobacco: Never  Vaping Use   Vaping Use: Never used  Substance and Sexual Activity   Alcohol use: Yes    Alcohol/week: 2.0 - 3.0 standard drinks of alcohol    Types: 2 - 3 Glasses of wine per week    Comment: per day   Drug  use: No   Sexual activity: Not Currently    Partners: Male    Comment: Married  Other Topics Concern   Not on file  Social History Narrative   Lives at home w/ her husband and granddaughter   Right-handed   Caffeine: 2 cups of tea daily   Social Determinants of Health   Financial Resource Strain: Not on file  Food Insecurity: Not on file  Transportation Needs: Not on file  Physical Activity: Not on file  Stress: Not on file  Social Connections: Not on file  Intimate Partner Violence: Not on file    Family History  Problem Relation Age of Onset   Aneurysm Mother    Heart disease Father    Diabetes Sister    Hypertension Sister    Hyperlipidemia Sister    Heart disease Brother    Hypertension Brother    Diabetes Brother    Hyperlipidemia Brother    Heart attack Brother    Neuropathy Neg Hx    Urticaria Neg Hx    Allergic rhinitis Neg Hx    Asthma Neg Hx     Past Medical History:  Diagnosis Date  Anemia    Arthritis 01/17/2011   degenerative disc disease-all joints   Bursitis    CAD (coronary artery disease)    Cholecystitis with cholelithiasis 03/22/2018   Chronic kidney disease, stage 3a    GERD (gastroesophageal reflux disease) 01/17/2011   tx. omeprazole   Heart murmur    slight murmur   Hyperlipidemia    Hypertension 01/17/2011   tx. meds   Hypothyroidism    Leg pain    ABIs 2/18: normal bilaterally   Neuropathy    FEET   Osteoarthritis    Peripheral neuropathy    Pre-diabetes    Pulmonary nodule    Seasonal allergies    Sinus problem 01/17/2011   tx. Claritin D   ST elevation (STEMI) myocardial infarction involving left circumflex coronary artery 08/21/2015    Patient Active Problem List   Diagnosis Date Noted   Idiopathic peripheral neuropathy 05/15/2022   B12 deficiency 05/15/2022   Allergy 01/01/2020   Status post total shoulder arthroplasty, right 06/05/2019   Lung nodules 11/18/2018   Pulmonary nodule less than 1 cm in diameter  with moderate to high risk for malignant neoplasm 09/16/2018   Cholecystitis with cholelithiasis 03/22/2018   S/P lumbar fusion 03/07/2017   Chronic low back pain 02/26/2017   Hematoma of arm 08/24/2015   Coronary artery disease involving native coronary artery of native heart without angina pectoris    Dyslipidemia 08/22/2015   Heart murmur    History of ST elevation myocardial infarction (STEMI) 08/21/2015   Appendicitis, acute 08/14/2013   S/P laparoscopic appendectomy 08/13/2013   Neuropathy 11/11/2012   Hypertension 01/17/2011   Osteoarthritis 01/30/1998    Past Surgical History:  Procedure Laterality Date   ABDOMINAL HYSTERECTOMY  01-17-11   '93-Hysterectomy-heavy bleeding   ANTERIOR LATERAL LUMBAR FUSION WITH PERCUTANEOUS SCREW 1 LEVEL N/A 03/07/2017   Procedure: XLIF L3-4;  Surgeon: Venita Lick, MD;  Location: [facility] OR;  Service: Orthopedics;  Laterality: N/A;  4 hrs   APPENDECTOMY     BACK SURGERY  01-17-11   04-20-10-T-Lift lumbar fusion   [facility] PROCEDURE  01-17-11   Bladder sling   CARDIAC CATHETERIZATION N/A 08/21/2015   Procedure: Left Heart Cath and Coronary Angiography;  Surgeon: Tonny Bollman, MD;  Location: [facility] INVASIVE CV LAB;  Service: Cardiovascular;  Laterality: N/A;   CARDIAC CATHETERIZATION N/A 08/21/2015   Procedure: Coronary Stent Intervention;  Surgeon: Tonny Bollman, MD;  Location: [facility] INVASIVE CV LAB;  Service: Cardiovascular;  Laterality: N/A;   CARDIAC CATHETERIZATION N/A 08/23/2015   Procedure: Coronary Stent Intervention;  Surgeon: Marykay Lex, MD;  Location: [facility] INVASIVE CV LAB;  Service: Cardiovascular;  Laterality: N/A;  promus 2.5x16 in RCA   CARDIAC CATHETERIZATION N/A 08/23/2015   Procedure: Left Heart Cath and Coronary Angiography;  Surgeon: Marykay Lex, MD;  Location: [facility] INVASIVE CV LAB;  Service: Cardiovascular;  Laterality: N/A;   CARPAL TUNNEL RELEASE  01-17-11   '02-left, also right   CHOLECYSTECTOMY N/A 03/22/2018   Procedure:  LAPAROSCOPIC CHOLECYSTECTOMY;  Surgeon: Claud Kelp, MD;  Location: [facility] OR;  Service: General;  Laterality: N/A;   COLONOSCOPY     HARDWARE REMOVAL Right 05/15/2013   Procedure: REMOVAL RIGHT L4-L5 PEDICLE SCREWS AND ROD ;  Surgeon: Venita Lick, MD;  Location: [facility] OR;  Service: Orthopedics;  Laterality: Right;   JOINT REPLACEMENT  01-17-11    RTKA' 02   LAPAROSCOPIC APPENDECTOMY N/A 08/13/2013   Procedure: APPENDECTOMY LAPAROSCOPIC;  Surgeon: Valarie Merino, MD;  Location: [facility] ORS;  Service: General;  Laterality: N/A;   TONSILLECTOMY  01-17-11   child   TOTAL KNEE ARTHROPLASTY  01/19/2011   Procedure: TOTAL KNEE ARTHROPLASTY;  Surgeon: Javier Docker;  Location: [facility] ORS;  Service: Orthopedics;  Laterality: Left;   TOTAL SHOULDER ARTHROPLASTY Right 06/05/2019   Procedure: TOTAL SHOULDER ARTHROPLASTY;  Surgeon: Francena Hanly, MD;  Location: [facility] ORS;  Service: Orthopedics;  Laterality: Right;     Current Outpatient Medications  Medication Sig Dispense Refill   acetaminophen (TYLENOL) 650 MG CR tablet Take 1,950 mg by mouth daily.     Alpha-Lipoic Acid 600 MG CAPS Take 1 capsule (600 mg total) by mouth 2 (two) times daily.     aspirin EC 81 MG tablet Take 81 mg by mouth at bedtime.      Bempedoic Acid-Ezetimibe (NEXLIZET) 180-10 MG TABS Take 1 tablet by mouth daily. 90 tablet 3   benazepril (LOTENSIN) 20 MG tablet Take 1 tablet (20 mg total) by mouth 2 (two) times daily. 180 tablet 3   Calcium Carbonate-Vitamin D (CALCIUM 600 + D PO) Take 1 tablet by mouth 2 (two) times daily.     Cholecalciferol (VITAMIN D) 2000 units CAPS Take 2,000 Units by mouth daily.     Cyanocobalamin (VITAMIN B-12 PO) Take 5,000 Units by mouth every other day.     docusate sodium (COLACE) 100 MG capsule Take 100 mg by mouth daily.     gabapentin (NEURONTIN) 600 MG tablet Take 600 mg by mouth 2 (two) times daily.     Glucosamine-Chondroit-Vit C-Mn (GLUCOSAMINE CHONDR 1500 COMPLX PO) Take 2 tablets by mouth  daily. 1500/1200     icosapent Ethyl (VASCEPA) 1 g capsule Take 2 capsules (2 g total) by mouth 2 (two) times daily. 360 capsule 3   levothyroxine (SYNTHROID, LEVOTHROID) 75 MCG tablet Take 75 mcg by mouth daily before breakfast.     loratadine-pseudoephedrine (CLARITIN-D 24-HOUR) 10-240 MG per 24 hr tablet Take 1 tablet by mouth every evening.     meclizine (ANTIVERT) 25 MG tablet Take 25 mg by mouth 3 (three) times daily as needed for dizziness.     Melatonin 10 MG TABS Take 10 mg by mouth at bedtime.      metoprolol tartrate (LOPRESSOR) 25 MG tablet Take 0.5 tablets (12.5 mg total) by mouth 2 (two) times daily. 90 tablet 3   Misc Natural Products (GLUCOSAMINE CHONDROITIN ADV) TABS      nitroGLYCERIN (NITROSTAT) 0.4 MG SL tablet Place 0.4 mg under the tongue every 5 (five) minutes as needed for chest pain.     NON FORMULARY Place 1 drop under the tongue daily. CBD Oil     omeprazole (PRILOSEC) 20 MG capsule Take 20 mg by mouth daily with breakfast.     polyethylene glycol (MIRALAX / GLYCOLAX) packet Take 17 g by mouth daily as needed for mild constipation.      rosuvastatin (CRESTOR) 40 MG tablet TAKE 1 TABLET BY MOUTH EVERYDAY AT BEDTIME 90 tablet 1   triamterene-hydrochlorothiazide (MAXZIDE-25) 37.5-25 MG tablet 1 tablet in the morning     No current facility-administered medications for this visit.    Allergies as of 05/15/2022 - Review Complete 05/15/2022  Allergen Reaction Noted   Pravastatin Other (See Comments) 08/13/2013   Nsaids  12/21/2020   Penicillins Other (See Comments) 01/12/2011   Pravastatin sodium  12/21/2020    Vitals: BP (!) 146/70   Pulse 82   Ht 4' 11.5" (1.511 m)   Wt 193 lb (87.5 kg)  BMI 38.33 kg/m  Last Weight:  Wt Readings from Last 1 Encounters:  05/15/22 193 lb (87.5 kg)   Last Height:   Ht Readings from Last 1 Encounters:  05/15/22 4' 11.5" (1.511 m)     Physical exam: Exam: Gen: NAD, conversant, well nourised, obese, well groomed                      CV: RRR, no MRG. No Carotid Bruits. lymphedema Eyes: Conjunctivae clear without exudates or hemorrhage  Neuro: Detailed Neurologic Exam  Speech:    Speech is normal; fluent and spontaneous with normal comprehension.  Cognition:    The patient is oriented to person, place, and time;     recent and remote memory intact;     language fluent;     normal attention, concentration,     fund of knowledge Cranial Nerves:    The pupils are equal, round, and reactive to light. Attempted pupils too small to visualizeVisual fields are full to finger confrontation. Extraocular movements are intact. Trigeminal sensation is intact and the muscles of mastication are normal. The face is symmetric. The palate elevates in the midline. Hearing intact. Voice is normal. Shoulder shrug is normal. The tongue has normal motion without fasciculations.   Coordination:   No dysmetira  Gait: Sensory ataxia wide based  Motor Observation:    No asymmetry, no atrophy, and no involuntary movements noted. Tone:    Normal muscle tone.    Posture:    Posture is normal. normal erect    Strength: deltoid weakness likely due to shoulder pain, otherwise 5/5 Strength is V/V in the upper and lower limbs.      Sensation: dec pp, cold to knees and only a few secs vibration at great toe     Reflex Exam:  DTR's:    Deep tendon reflexes in the upper and lower extremities are hyporeflexic throughout bilaterally.   Toes:    The toes are downgoing bilaterally.   Clonus:    Clonus is absent.    Assessment/Plan:  Gabrielle [name] is a 77 y.o. female here as requested by Daisy Floro, MD for neuropathy. PMHx htn, hld, hypona, peripheral neuropathy, ckd, anemia, prediabetes, CAD, psteopenia, b12 deficiency, hx of MI, hypothyroid, lumbar spondylosis, obesity, ckd stage 3, gerd, osteopenia, on gabapentin and Vt B12.   Addendum 05-21-2022: ABI returned "toe brachial index abnormal": Forarded via EPIC  results  to Dr. Tenny Craw due to "toe brachial index abnormal": Dr. Tenny Craw, not sure what to do with this so bouncing back to you. I did place a consult for: "AMbulatory referral for peripheral vascular consult" but it seems to go to cadiovascular if this is not acurate please adjust not sure of she is beginning to get PAD.  ABI:  Summary:  Right: Resting right ankle-brachial index is within normal range. The  right toe-brachial index is abnormal.   Left: Resting left ankle-brachial index is within normal range. The left  toe-brachial index is abnormal.   - Her legs are swollen with dependent rubor and fert are cold. dec pp, cold to knees and only a few secs vibration at great toe. Sensory ataxia gait. - Discussed proprioception and keeping lights on to avoid falls - She has many risk factors for peripheral neuropathy: long-term pre-diabetes, long-term B12 deficiency, 2-3 glasses of wine a night, Also had severe lumbar spinal stenosis which could have caused nerve damage, will look for other causes - Finish panel of blood work  for other causes - Ask about mounjaro to Dr. Tenny Craw also wegovy; Weight loss drug Wegovy gains FDA approval to reduce heart disease risk. On March 8, the FDA approved the use of Wegovy to reduce the risk of cardiovascular death, heart attack, and stroke in adults with heart disease with obesity or overweight. -May consider daily alpha lipoic acid which is an antioxidant that may reduce free radical oxidative stress associated with diabetic polyneuropathy, existing evidence suggests that alpha lipoic acid significantly reduces stabbing, lancinating and burning pain and diabetic neuropathy with its onset of action as early as 1-2 weeks.  1-2x a day. Watch for B1 and thyroid.  -emg/ncs to rule out other causes like CIDP (chronic inflammatory demyelinating polyneuropathy) -She may have PAD hasn't been check, feet are cold to the touch, rubor on dependency, smoked for years, will recheck  ABI  Orders Placed This Encounter  Procedures   Anti-parietal antibody   Intrinsic Factor Antibodies   Sjogren's syndrome antibods(ssa + ssb)   ANA, IFA (with reflex)   Vitamin B1   Vitamin B6   RPR   Hepatitis C antibody   Rheumatoid factor   Heavy metals, blood   Multiple Myeloma Panel (SPEP&IFE w/QIG)   NCV with EMG(electromyography)   VAS [place] ABI WITH/WO TBI   Meds ordered this encounter  Medications   Alpha-Lipoic Acid 600 MG CAPS    Sig: Take 1 capsule (600 mg total) by mouth 2 (two) times daily.    Cc: Daisy Floro, MD,  Daisy Floro, MD  Naomie Dean, MD  [facility] [address] [place], [place] [address]  Phone [phone] Fax [phone]  I spent 90 minutes of face-to-face and non-face-to-face time with patient on the  1. Idiopathic peripheral neuropathy   2. B12 deficiency   3. screen for Vitamin B6 deficiency   4. screen for Vitamin B1 deficiency neuropathy   5. Dependent rubor   6. Bilateral cold feet   7. Hx of smoking    diagnosis.  This included previsit chart review, lab review, study review, order entry, electronic health record documentation, patient education on the different diagnostic and therapeutic options, counseling and coordination of care, risks and benefits of management, compliance, or risk factor reduction

## 2022-05-16 DIAGNOSIS — Z1231 Encounter for screening mammogram for malignant neoplasm of breast: Secondary | ICD-10-CM | POA: Diagnosis not present

## 2022-05-16 LAB — MULTIPLE MYELOMA PANEL, SERUM

## 2022-05-16 LAB — VITAMIN B1

## 2022-05-16 LAB — RHEUMATOID FACTOR: Rheumatoid fact SerPl-aCnc: <10 IU/mL (ref <14.0)

## 2022-05-17 LAB — MULTIPLE MYELOMA PANEL, SERUM

## 2022-05-17 LAB — ANTI-PARIETAL ANTIBODY: Parietal Cell Ab: 2.8 Units (ref 0.0–20.0)

## 2022-05-17 LAB — SJOGREN'S SYNDROME ANTIBODS(SSA + SSB): ENA SSB (LA) Ab: <0.2 AI (ref 0.0–0.9)

## 2022-05-17 LAB — HEPATITIS C ANTIBODY: Hep C Virus Ab: NONREACTIVE

## 2022-05-18 LAB — HEAVY METALS, BLOOD: Mercury: 2.2 ug/L (ref 0.0–14.9)

## 2022-05-18 LAB — RPR: RPR Ser Ql: NONREACTIVE

## 2022-05-18 LAB — INTRINSIC FACTOR ANTIBODIES: Intrinsic Factor Abs, Serum: 1 AU/mL (ref 0.0–1.1)

## 2022-05-19 ENCOUNTER — Ambulatory Visit (HOSPITAL_COMMUNITY)
Admission: RE | Admit: 2022-05-19 | Discharge: 2022-05-19 | Disposition: A | Discharge status: Home / Self Care | Payer: Medicare Other | Source: Ambulatory Visit | Attending: Neurology | Admitting: Neurology

## 2022-05-19 DIAGNOSIS — L539 Erythematous condition, unspecified: Secondary | ICD-10-CM | POA: Insufficient documentation

## 2022-05-19 DIAGNOSIS — G609 Hereditary and idiopathic neuropathy, unspecified: Secondary | ICD-10-CM

## 2022-05-19 DIAGNOSIS — I739 Peripheral vascular disease, unspecified: Secondary | ICD-10-CM

## 2022-05-19 DIAGNOSIS — R209 Unspecified disturbances of skin sensation: Secondary | ICD-10-CM | POA: Insufficient documentation

## 2022-05-19 DIAGNOSIS — Z87891 Personal history of nicotine dependence: Secondary | ICD-10-CM | POA: Diagnosis not present

## 2022-05-19 LAB — VAS US ABI WITH/WO TBI
Left ABI: 1
Right ABI: 1

## 2022-05-21 ENCOUNTER — Other Ambulatory Visit: Payer: Self-pay | Admitting: Neurology

## 2022-05-21 DIAGNOSIS — I739 Peripheral vascular disease, unspecified: Secondary | ICD-10-CM

## 2022-05-21 LAB — MULTIPLE MYELOMA PANEL, SERUM
B-Globulin SerPl Elph-Mcnc: 0.9 g/dL (ref 0.7–1.3)
Gamma Glob SerPl Elph-Mcnc: 0.8 g/dL (ref 0.4–1.8)
IgA/Immunoglobulin A, Serum: 150 mg/dL (ref 64–422)
IgG (Immunoglobin G), Serum: 731 mg/dL (ref 586–1602)
IgM (Immunoglobulin M), Srm: 94 mg/dL (ref 26–217)
Total Protein: 6.9 g/dL (ref 6.0–8.5)

## 2022-05-21 LAB — VITAMIN B6: Vitamin B6: 8 ug/L (ref 3.4–65.2)

## 2022-05-21 LAB — SJOGREN'S SYNDROME ANTIBODS(SSA + SSB): ENA SSA (RO) Ab: <0.2 AI (ref 0.0–0.9)

## 2022-05-21 LAB — HEAVY METALS, BLOOD
Arsenic: 4 ug/L (ref 0–9)
Lead, Blood: 1 ug/dL (ref 0.0–3.4)

## 2022-05-21 LAB — ANTINUCLEAR ANTIBODIES, IFA: ANA Titer 1: NEGATIVE

## 2022-05-25 ENCOUNTER — Telehealth: Payer: Self-pay | Admitting: *Deleted

## 2022-05-25 NOTE — Telephone Encounter (Signed)
-----   Message from Anson Fret, MD sent at 05/21/2022  3:20 PM EDT ----- Please call and let patient know: Yarenis I am going to send to Vascular team bc your results show "The right toe-brachial index is abnormal." Which Is not in my area of experise make sure you see them thanks

## 2022-05-25 NOTE — Telephone Encounter (Signed)
Sent referral via Epic to [facility], phone # [phone].

## 2022-05-25 NOTE — Telephone Encounter (Signed)
Called pt & LVM (ok per DPR) advising her results of test showed R toe toe-brachial index is abnormal. Dr Lucia Gaskins has sent patient to vascular team and they should give her a call. I asked her to give [place] a call if she has any questions. Left office number in message.

## 2022-06-06 ENCOUNTER — Encounter: Payer: Self-pay | Admitting: Cardiovascular Disease

## 2022-06-06 ENCOUNTER — Ambulatory Visit: Payer: Medicare Other | Attending: Cardiovascular Disease | Admitting: Cardiovascular Disease

## 2022-06-06 VITALS — BP 122/64 | HR 79 | Ht 59.5 in | Wt 194.8 lb

## 2022-06-06 DIAGNOSIS — N1831 Chronic kidney disease, stage 3a: Secondary | ICD-10-CM

## 2022-06-06 DIAGNOSIS — I739 Peripheral vascular disease, unspecified: Secondary | ICD-10-CM | POA: Diagnosis not present

## 2022-06-06 DIAGNOSIS — E785 Hyperlipidemia, unspecified: Secondary | ICD-10-CM | POA: Diagnosis not present

## 2022-06-06 DIAGNOSIS — I10 Essential (primary) hypertension: Secondary | ICD-10-CM

## 2022-06-06 DIAGNOSIS — I2510 Atherosclerotic heart disease of native coronary artery without angina pectoris: Secondary | ICD-10-CM

## 2022-06-06 NOTE — Patient Instructions (Signed)
Medication Instructions:  No changes *If you need a refill on your cardiac medications before your next appointment, please call your pharmacy*   Lab Work: None ordered If you have labs (blood work) drawn today and your tests are completely normal, you will receive your results only by: MyChart Message (if you have MyChart) OR A paper copy in the mail If you have any lab test that is abnormal or we need to change your treatment, we will call you to review the results.   Testing/Procedures: None ordered   Follow-Up: At [facility], you and your health needs are our priority.  As part of our continuing mission to provide you with exceptional heart care, we have created designated Provider Care Teams.  These Care Teams include your primary Cardiologist (physician) and Advanced Practice Providers (APPs -  Physician Assistants and Nurse Practitioners) who all work together to provide you with the care you need, when you need it.  We recommend signing up for the patient portal called "MyChart".  Sign up information is provided on this After Visit Summary.  MyChart is used to connect with patients for Virtual Visits (Telemedicine).  Patients are able to view lab/test results, encounter notes, upcoming appointments, etc.  Non-urgent messages can be sent to your provider as well.   To learn more about what you can do with MyChart, go to [email or web address].    Your next appointment:   Follow up as needed with Dr. Arida 

## 2022-06-06 NOTE — Progress Notes (Unsigned)
Cardiology Office Note   Date:  06/06/2022   ID:  Gabrielle [name], DOB Aug 29, 1945, MRN [id number]  PCP:  Gabrielle Floro, MD  Cardiologist:  Gabrielle [name]  No chief complaint on file.     History of Present Illness: Gabrielle [name] is a 77 y.o. female who was referred by Gabrielle [name] for evaluation of peripheral arterial disease. She has known history of coronary artery disease, anemia, arthritis, GERD, hypothyroidism, hypertension, peripheral neuropathy, prediabetes, stage IIIa chronic kidney disease and pulmonary nodules. She is followed by Gabrielle [name].  She had inferolateral STEMI in 2017 due to an occluded OM1 which was treated with PCI.  She underwent staged RCA PCI.  She is known to have normal LV systolic function. She has prolonged history of peripheral neuropathy presumed to be due to prediabetes. She underwent recent lower extremity arterial Doppler which showed normal ABI bilaterally.  However, her toe brachial index was abnormal at 0.5 on the right and 0.39 on the left.  She has no true leg claudication but reports numbness starting from the knees down to her toes.  In addition, she has chronic cyanosis of her toes and mild lower extremity edema.  No chest pain or shortness of breath.  Past Medical History:  Diagnosis Date   Anemia    Arthritis 01/17/2011   degenerative disc disease-all joints   Bursitis    CAD (coronary artery disease)    Cholecystitis with cholelithiasis 03/22/2018   Chronic kidney disease, stage 3a (HCC)    GERD (gastroesophageal reflux disease) 01/17/2011   tx. omeprazole   Heart murmur    slight murmur   Hyperlipidemia    Hypertension 01/17/2011   tx. meds   Hypothyroidism    Leg pain    ABIs 2/18: normal bilaterally   Neuropathy    FEET   Osteoarthritis    Peripheral neuropathy    Pre-diabetes    Pulmonary nodule    Seasonal allergies    Sinus problem 01/17/2011   tx. Claritin D   ST elevation (STEMI) myocardial infarction involving  left circumflex coronary artery (HCC) 08/21/2015    Past Surgical History:  Procedure Laterality Date   ABDOMINAL HYSTERECTOMY  01-17-11   '93-Hysterectomy-heavy bleeding   ANTERIOR LATERAL LUMBAR FUSION WITH PERCUTANEOUS SCREW 1 LEVEL N/A 03/07/2017   Procedure: XLIF L3-4;  Surgeon: Venita Lick, MD;  Location: [facility] OR;  Service: Orthopedics;  Laterality: N/A;  4 hrs   APPENDECTOMY     BACK SURGERY  01-17-11   04-20-10-T-Lift lumbar fusion   [facility] PROCEDURE  01-17-11   Bladder sling   CARDIAC CATHETERIZATION N/A 08/21/2015   Procedure: Left Heart Cath and Coronary Angiography;  Surgeon: Tonny Bollman, MD;  Location: [facility] INVASIVE CV LAB;  Service: Cardiovascular;  Laterality: N/A;   CARDIAC CATHETERIZATION N/A 08/21/2015   Procedure: Coronary Stent Intervention;  Surgeon: Tonny Bollman, MD;  Location: [facility] INVASIVE CV LAB;  Service: Cardiovascular;  Laterality: N/A;   CARDIAC CATHETERIZATION N/A 08/23/2015   Procedure: Coronary Stent Intervention;  Surgeon: Marykay Lex, MD;  Location: [facility] INVASIVE CV LAB;  Service: Cardiovascular;  Laterality: N/A;  promus 2.5x16 in RCA   CARDIAC CATHETERIZATION N/A 08/23/2015   Procedure: Left Heart Cath and Coronary Angiography;  Surgeon: Marykay Lex, MD;  Location: [facility] INVASIVE CV LAB;  Service: Cardiovascular;  Laterality: N/A;   CARPAL TUNNEL RELEASE  01-17-11   '02-left, also right   CHOLECYSTECTOMY N/A 03/22/2018   Procedure: LAPAROSCOPIC CHOLECYSTECTOMY;  Surgeon: Claud Kelp, MD;  Location: [facility] OR;  Service: General;  Laterality: N/A;   COLONOSCOPY     HARDWARE REMOVAL Right 05/15/2013   Procedure: REMOVAL RIGHT L4-L5 PEDICLE SCREWS AND ROD ;  Surgeon: Venita Lick, MD;  Location: [facility] OR;  Service: Orthopedics;  Laterality: Right;   JOINT REPLACEMENT  01-17-11    RTKA' 02   LAPAROSCOPIC APPENDECTOMY N/A 08/13/2013   Procedure: APPENDECTOMY LAPAROSCOPIC;  Surgeon: Valarie Merino, MD;  Location: [facility] ORS;  Service: General;  Laterality: N/A;    TONSILLECTOMY  01-17-11   child   TOTAL KNEE ARTHROPLASTY  01/19/2011   Procedure: TOTAL KNEE ARTHROPLASTY;  Surgeon: Javier Docker;  Location: [facility] ORS;  Service: Orthopedics;  Laterality: Left;   TOTAL SHOULDER ARTHROPLASTY Right 06/05/2019   Procedure: TOTAL SHOULDER ARTHROPLASTY;  Surgeon: Francena Hanly, MD;  Location: [facility] ORS;  Service: Orthopedics;  Laterality: Right;      Current Outpatient Medications  Medication Sig Dispense Refill   acetaminophen (TYLENOL) 650 MG CR tablet Take 1,950 mg by mouth daily.     Alpha-Lipoic Acid 600 MG CAPS Take 1 capsule (600 mg total) by mouth 2 (two) times daily.     aspirin EC 81 MG tablet Take 81 mg by mouth at bedtime.      Bempedoic Acid-Ezetimibe (NEXLIZET) 180-10 MG TABS Take 1 tablet by mouth daily. 90 tablet 3   benazepril (LOTENSIN) 20 MG tablet Take 1 tablet (20 mg total) by mouth 2 (two) times daily. 180 tablet 3   Calcium Carbonate-Vitamin D (CALCIUM 600 + D PO) Take 1 tablet by mouth 2 (two) times daily.     Cholecalciferol (VITAMIN D) 2000 units CAPS Take 2,000 Units by mouth daily.     Cyanocobalamin (VITAMIN B-12 PO) Take 5,000 Units by mouth every other day.     docusate sodium (COLACE) 100 MG capsule Take 100 mg by mouth daily.     gabapentin (NEURONTIN) 600 MG tablet Take 600 mg by mouth 2 (two) times daily.     Glucosamine-Chondroit-Vit C-Mn (GLUCOSAMINE CHONDR 1500 COMPLX PO) Take 2 tablets by mouth daily. 1500/1200     icosapent Ethyl (VASCEPA) 1 g capsule Take 2 capsules (2 g total) by mouth 2 (two) times daily. 360 capsule 3   levothyroxine (SYNTHROID, LEVOTHROID) 75 MCG tablet Take 75 mcg by mouth daily before breakfast.     loratadine-pseudoephedrine (CLARITIN-D 24-HOUR) 10-240 MG per 24 hr tablet Take 1 tablet by mouth every evening.     meclizine (ANTIVERT) 25 MG tablet Take 25 mg by mouth 3 (three) times daily as needed for dizziness.     Melatonin 10 MG TABS Take 10 mg by mouth at bedtime.      metoprolol tartrate  (LOPRESSOR) 25 MG tablet Take 0.5 tablets (12.5 mg total) by mouth 2 (two) times daily. 90 tablet 3   Misc Natural Products (GLUCOSAMINE CHONDROITIN ADV) TABS      nitroGLYCERIN (NITROSTAT) 0.4 MG SL tablet Place 0.4 mg under the tongue every 5 (five) minutes as needed for chest pain.     NON FORMULARY Place 1 drop under the tongue daily. CBD Oil     omeprazole (PRILOSEC) 20 MG capsule Take 20 mg by mouth daily with breakfast.     polyethylene glycol (MIRALAX / GLYCOLAX) packet Take 17 g by mouth daily as needed for mild constipation.      rosuvastatin (CRESTOR) 40 MG tablet TAKE 1 TABLET BY MOUTH EVERYDAY AT BEDTIME 90 tablet 1   triamterene-hydrochlorothiazide (MAXZIDE-25) 37.5-25 MG tablet  1 tablet in the morning     No current facility-administered medications for this visit.    Allergies:   Pravastatin, Nsaids, Penicillins, and Pravastatin sodium    Social History:  The patient  reports that she quit smoking about 28 years ago. Her smoking use included cigarettes. She started smoking about 59 years ago. She has a 30.00 pack-year smoking history. She has never used smokeless tobacco. She reports current alcohol use of about 2.0 - 3.0 standard drinks of alcohol per week. She reports that she does not use drugs.   Family History:  The patient's family history includes Aneurysm in her mother; Diabetes in her brother and sister; Heart attack in her brother; Heart disease in her brother and father; Hyperlipidemia in her brother and sister; Hypertension in her brother and sister.    ROS:  Please see the history of present illness.   Otherwise, review of systems are positive for none.   All other systems are reviewed and negative.    PHYSICAL EXAM: VS:  BP 122/64   Pulse 79   Ht 4' 11.5" (1.511 m)   Wt 194 lb 12.8 oz (88.4 kg)   SpO2 99%   BMI 38.69 kg/m  , BMI Body mass index is 38.69 kg/m. GEN: Well nourished, well developed, in no acute distress  HEENT: normal  Neck: no JVD, carotid  bruits, or masses Cardiac: RRR; no murmurs, rubs, or gallops,no edema  Respiratory:  clear to auscultation bilaterally, normal work of breathing GI: soft, nontender, nondistended, + BS MS: no deformity or atrophy  Skin: warm and dry, no rash Neuro:  Strength and sensation are intact Psych: euthymic mood, full affect Vascular: Radial pulse: Normal bilaterally, femoral pulses: Normal bilaterally.  Dorsalis pedis and posterior tibial is normal bilaterally. Mild cyanosis of the toes.   EKG:  EKG is ordered today. The ekg ordered today demonstrates sinus rhythm with left axis deviation, incomplete right bundle branch block, LVH with repolarization abnormalities.   Recent Labs: No results found for requested labs within last 365 days.    Lipid Panel    Component Value Date/Time   CHOL 146 10/27/2019 0813   TRIG 168 (H) 10/27/2019 0813   HDL 51 10/27/2019 0813   CHOLHDL 2.9 10/27/2019 0813   CHOLHDL 2.4 10/12/2015 0835   VLDL 28 10/12/2015 0835   LDLCALC 67 10/27/2019 0813      Wt Readings from Last 3 Encounters:  06/06/22 194 lb 12.8 oz (88.4 kg)  05/15/22 193 lb (87.5 kg)  08/16/21 183 lb 12.8 oz (83.4 kg)            No data to display            ASSESSMENT AND PLAN:  1.  Peripheral arterial disease: The patient has normal ABI with abnormal toe pressure.  This is consistent with her physical exam that reveals palpable pulses throughout her lower extremities.  This is likely indicative of small vessel and microvascular disease and I do not think it is causing significant issues at the present time. The treatment of this is medical as she is on optimal medications for her heart.  No indication for revascularization. This might be causing some of her feet symptoms.  However, I suspect that the majority of her symptoms are still related to peripheral neuropathy. In addition, the cyanosis likely reflects a degree of chronic venous insufficiency.  2.  Coronary artery  disease involving native coronary arteries without angina: She is doing well with no  anginal symptoms.  3.  Essential hypertension: Blood pressure is well-controlled on current medications.  4.  Hyperlipidemia: She is on Nexlizet, Vascepa and rosuvastatin with excellent control.  5.  Stage IIIa chronic kidney disease: Stable.    Disposition:   FU with me as needed.  Signed,  Lorine Bears, MD  06/06/2022 10:14 AM     [facility]

## 2022-06-25 DIAGNOSIS — L255 Unspecified contact dermatitis due to plants, except food: Secondary | ICD-10-CM | POA: Diagnosis not present

## 2022-06-27 ENCOUNTER — Other Ambulatory Visit: Payer: Self-pay | Admitting: Cardiovascular Disease

## 2022-07-03 ENCOUNTER — Encounter: Payer: Medicare Other | Admitting: Neurology

## 2022-07-05 ENCOUNTER — Ambulatory Visit: Payer: Medicare Other | Admitting: Podiatry

## 2022-07-06 ENCOUNTER — Encounter: Payer: Medicare Other | Admitting: Neurology

## 2022-07-10 ENCOUNTER — Telehealth: Payer: Self-pay | Admitting: Pharmacist

## 2022-07-10 NOTE — Telephone Encounter (Signed)
Received fax with [facility] approval. We didn't initiate this so assuming pt did, but called her and left message about approval and to let us know if she needs any info for the grant. Approval faxed into media tab.

## 2022-07-11 DIAGNOSIS — Z0000 Encounter for general adult medical examination without abnormal findings: Secondary | ICD-10-CM | POA: Diagnosis not present

## 2022-07-13 ENCOUNTER — Ambulatory Visit (INDEPENDENT_AMBULATORY_CARE_PROVIDER_SITE_OTHER): Payer: Medicare Other | Admitting: Neurology

## 2022-07-13 ENCOUNTER — Ambulatory Visit: Payer: Self-pay | Admitting: Neurology

## 2022-07-13 VITALS — Ht 59.6 in | Wt 193.0 lb

## 2022-07-13 DIAGNOSIS — G608 Other hereditary and idiopathic neuropathies: Secondary | ICD-10-CM

## 2022-07-13 DIAGNOSIS — Z0289 Encounter for other administrative examinations: Secondary | ICD-10-CM

## 2022-07-13 DIAGNOSIS — R209 Unspecified disturbances of skin sensation: Secondary | ICD-10-CM

## 2022-07-13 DIAGNOSIS — G609 Hereditary and idiopathic neuropathy, unspecified: Secondary | ICD-10-CM

## 2022-07-13 NOTE — Progress Notes (Addendum)
Gabrielle [name] is a 77 y.o. female here as requested by Daisy Floro, MD for neuropathy. PMHx htn, hld, hypona, peripheral neuropathy, ckd, anemia, prediabetes, CAD, psteopenia, b12 deficiency, hx of MI, hypothyroid, lumbar spondylosis, obesity, ckd stage 3, gerd, osteopenia, on gabapentin and Vt B12. Also recently abnormal ABI needs eval for PAD.  We discussed workup and recommendations today and findings on emg/ncs. She has many risk factors for peripheral neuropathy: long-term pre-diabetes, long-term B12 deficiency, 2-3 glasses of wine a night, Also had severe lumbar spinal stenosis which could have caused nerve damage, Also, toe abnormal on ABI likely PAD  Smoked a pack a day for 20 years Feet are cold and blue with rubor, ABI was abnomal on the left foot which is the worst foot, toes turn blue and different colors and worse neuropathy; possibly PAD  Recommendations:  - stop alcohol - check B12 - Check for PAD and treat;  Appreciate my colleague Dr. Kirke Corin - Treat pre-diaetes(pre-diabetic for 20 years)  Discussed with patient at length   I spent over 20 minutes of face-to-face and non-face-to-face time with patient on the  1. Peripheral sensory-motor axonal polyneuropathy    diagnosis.  This included previsit chart review, lab review, study review, order entry, electronic health record documentation, patient education on the different diagnostic and therapeutic options, counseling and coordination of care, risks and benefits of management, compliance, or risk factor reduction

## 2022-07-17 DIAGNOSIS — G608 Other hereditary and idiopathic neuropathies: Secondary | ICD-10-CM | POA: Insufficient documentation

## 2022-07-17 NOTE — Progress Notes (Signed)
      Full Name: Kayliana Chavero Gender: Female MRN #: [id number] Date of Birth: 10/28/1945    Visit Date: 07/13/2022 07:29 Age: 77 Years Examining Physician: Dr. Jesper Stirewalt Referring Physician: Dr. Atilla Zollner Height: 4 feet 11 inch    History: Gabrielle [name] is a 77 y.o. female here as requested by Ross, Charles Alan, MD for neuropathy. PMHx htn, hld, hypona, peripheral neuropathy, ckd, anemia, prediabetes, CAD, psteopenia, b12 deficiency, hx of MI, hypothyroid, lumbar spondylosis status post 2 surgeries, obesity, ckd stage 3, gerd, osteopenia, on gabapentin and Vt B12. Also recently abnormal ABI needs eval for PAD.  She has chronic low back pain but denies radiculopathy status post 2 lumbar surgeries.  She reports numbness and tingling progressing from the toes up to the knees symmetrically and bilaterally.No symptoms in the hands.  Summary: NCS was performed on the bilateral lower and right upper extremities. Right peroneal was within normal limits Right tibial motor nerve showed reduced amplitude (3.1 mV, Normal greater than 4) and decreased conduction velocity (pop fossa to ankle, 35 m/s, normal greater than 41) Left tibial motor nerve showed decreased conduction velocity (38 m/s, normal greater than 41). Right radial sensory nerve showed reduced amplitude (14 V, normal greater than 15) Right ulnar digit 5 antidromic sensory nerve showed reduced amplitude (10 V, normal greater than 17). The right sural, left sural, right superficial peroneal and left superficial peroneal sensory nerves showed no response The right tibial F wave showed delayed latency (59.4 ms, normal less than 56) The left tibial F wave showed normal latency All remaining nerves (as indicated in the following tables) were within normal limits.    EMG: Was performed on the right lower and left lower extremities. The right and left extensor hallucis longus muscles as well as the bilateral abductor hallucis muscles  showed spontaneous activity, polyphasic motor units and reduced recruitment.  Conclusion: Patient has a severe sensorimotor length-dependent distal lower-extremity peripheral polyneuropathy. Please see accompanying note for evaluation and recommendations.  ------------------------------ Crimson Dubberly, M.D.  [facility] [address], [address] [place], [place] [address] Tel: [phone] Fax: [phone]  Verbal informed consent was obtained from the patient, patient was informed of potential risk of procedure, including bruising, bleeding, hematoma formation, infection, muscle weakness, muscle pain, numbness, among others.        MNC    Nerve / Sites Muscle Latency Ref. Amplitude Ref. Rel Amp Segments Distance Velocity Ref. Area    ms ms mV mV %  cm m/s m/s mVms  R Peroneal - EDB     Ankle EDB 5.9 ?6.5 2.2 ?2.0 100 Ankle - EDB 9   5.6     Fib head EDB 11.6  2.1  95.2 Fib head - Ankle 25 44 ?44 5.1     Pop fossa EDB 13.9  2.1  100 Pop fossa - Fib head 10 44 ?44 5.2  R Tibial - AH     Ankle AH 5.1 ?5.8 3.1 ?4.0 100 Ankle - AH 9   8.6     Pop fossa AH 15.2  1.8  59 Pop fossa - Ankle 35 35 ?41 6.8  L Tibial - AH     Ankle AH 5.1 ?5.8 4.8 ?4.0 100 Ankle - AH 9   14.0     Pop fossa AH 13.8  3.6  73.9 Pop fossa - Ankle 33 38 ?41 12.0             SNC      Nerve / Sites Rec. Site Peak Lat Ref.  Amp Ref. Segments Distance    ms ms V V  cm  R Ulnar - Digit V (Antidromic)     Wrist Dig V 2.9 ?3.1 10 ?17 Wrist - Dig V 11  R Radial - Anatomical snuff box (Forearm)     Forearm Wrist 2.2 ?2.9 14 ?15 Forearm - Wrist 10  R Sural - Ankle (Calf)     Calf Ankle NR ?4.4 NR ?6 Calf - Ankle 14  L Sural - Ankle (Calf)     Calf Ankle NR ?4.4 NR ?6 Calf - Ankle 14  R Superficial peroneal - Ankle     Lat leg Ankle NR ?4.4 NR ?6 Lat leg - Ankle 14  L Superficial peroneal - Ankle     Lat leg Ankle NR ?4.4 NR ?6 Lat leg - Ankle 14                 F  Wave    Nerve F Lat Ref.   ms  ms  R Tibial - AH 59.4 ?56.0  L Tibial - AH 51.4 ?56.0         EMG Summary Table    Spontaneous MUAP Recruitment  Muscle IA Fib PSW Fasc Other Amp Dur. Poly Pattern  R. Vastus medialis Normal None None None _______ Normal Normal Normal Normal  R. Tibialis anterior Normal None None None _______ Normal Normal Normal Normal  R. Gastrocnemius (Medial head) Normal None None None _______ Normal Normal Normal Normal  R. Extensor hallucis longus Normal None 1+ None _______ Normal Normal 2+ Reduced  R. Abductor hallucis Normal None 2+ None _______ Normal Normal +2 Reduced  L. Vastus medialis Normal None None None _______ Normal Normal Normal Normal  L. Tibialis anterior Normal None None None _______ Normal Normal Normal Normal  L. Gastrocnemius (Medial head) Normal None None None _______ Normal Normal Normal Normal  L. Extensor hallucis longus Normal None 3+ None _______ Normal Normal 2+ Reduced  L. Abductor hallucis Normal None 3+ None _______ Normal Normal +2 Reduced     

## 2022-07-17 NOTE — Progress Notes (Signed)
error 

## 2022-07-17 NOTE — Procedures (Signed)
Full Name: Gabrielle [name] Gender: Female MRN #: [id number] Date of Birth: 09/27/1945    Visit Date: 07/13/2022 07:29 Age: 77 Years Examining Physician: Dr. Naomie Dean Referring Physician: Dr. Naomie Dean Height: 4 feet 11 inch    History: Gabrielle [name] is a 77 y.o. female here as requested by Daisy Floro, MD for neuropathy. PMHx htn, hld, hypona, peripheral neuropathy, ckd, anemia, prediabetes, CAD, psteopenia, b12 deficiency, hx of MI, hypothyroid, lumbar spondylosis status post 2 surgeries, obesity, ckd stage 3, gerd, osteopenia, on gabapentin and Vt B12. Also recently abnormal ABI needs eval for PAD.  She has chronic low back pain but denies radiculopathy status post 2 lumbar surgeries.  She reports numbness and tingling progressing from the toes up to the knees symmetrically and bilaterally.No symptoms in the hands.  Summary: NCS was performed on the bilateral lower and right upper extremities. Right peroneal was within normal limits Right tibial motor nerve showed reduced amplitude (3.1 mV, Normal greater than 4) and decreased conduction velocity (pop fossa to ankle, 35 m/s, normal greater than 41) Left tibial motor nerve showed decreased conduction velocity (38 m/s, normal greater than 41). Right radial sensory nerve showed reduced amplitude (14 V, normal greater than 15) Right ulnar digit 5 antidromic sensory nerve showed reduced amplitude (10 V, normal greater than 17). The right sural, left sural, right superficial peroneal and left superficial peroneal sensory nerves showed no response The right tibial F wave showed delayed latency (59.4 ms, normal less than 56) The left tibial F wave showed normal latency All remaining nerves (as indicated in the following tables) were within normal limits.    EMG: Was performed on the right lower and left lower extremities. The right and left extensor hallucis longus muscles as well as the bilateral abductor hallucis muscles  showed spontaneous activity, polyphasic motor units and reduced recruitment.  Conclusion: Patient has a severe sensorimotor length-dependent distal lower-extremity peripheral polyneuropathy. Please see accompanying note for evaluation and recommendations.  ------------------------------ Naomie Dean, M.D.  [facility] [address] [place], [place] [address] Tel: [phone] Fax: [phone]  Verbal informed consent was obtained from the patient, patient was informed of potential risk of procedure, including bruising, bleeding, hematoma formation, infection, muscle weakness, muscle pain, numbness, among others.        MNC    Nerve / Sites Muscle Latency Ref. Amplitude Ref. Rel Amp Segments Distance Velocity Ref. Area    ms ms mV mV %  cm m/s m/s mVms  R Peroneal - EDB     Ankle EDB 5.9 ?6.5 2.2 ?2.0 100 Ankle - EDB 9   5.6     Fib head EDB 11.6  2.1  95.2 Fib head - Ankle 25 44 ?44 5.1     Pop fossa EDB 13.9  2.1  100 Pop fossa - Fib head 10 44 ?44 5.2  R Tibial - AH     Ankle AH 5.1 ?5.8 3.1 ?4.0 100 Ankle - AH 9   8.6     Pop fossa AH 15.2  1.8  59 Pop fossa - Ankle 35 35 ?41 6.8  L Tibial - AH     Ankle AH 5.1 ?5.8 4.8 ?4.0 100 Ankle - AH 9   14.0     Pop fossa AH 13.8  3.6  73.9 Pop fossa - Ankle 33 38 ?41 12.0             SNC  Nerve / Sites Rec. Site Peak Lat Ref.  Amp Ref. Segments Distance    ms ms V V  cm  R Ulnar - Digit V (Antidromic)     Wrist Dig V 2.9 ?3.1 10 ?17 Wrist - Dig V 11  R Radial - Anatomical snuff box (Forearm)     Forearm Wrist 2.2 ?2.9 14 ?15 Forearm - Wrist 10  R Sural - Ankle (Calf)     Calf Ankle NR ?4.4 NR ?6 Calf - Ankle 14  L Sural - Ankle (Calf)     Calf Ankle NR ?4.4 NR ?6 Calf - Ankle 14  R Superficial peroneal - Ankle     Lat leg Ankle NR ?4.4 NR ?6 Lat leg - Ankle 14  L Superficial peroneal - Ankle     Lat leg Ankle NR ?4.4 NR ?6 Lat leg - Ankle 14                 F  Wave    Nerve F Lat Ref.   ms  ms  R Tibial - AH 59.4 ?56.0  L Tibial - AH 51.4 ?56.0         EMG Summary Table    Spontaneous MUAP Recruitment  Muscle IA Fib PSW Fasc Other Amp Dur. Poly Pattern  R. Vastus medialis Normal None None None _______ Normal Normal Normal Normal  R. Tibialis anterior Normal None None None _______ Normal Normal Normal Normal  R. Gastrocnemius (Medial head) Normal None None None _______ Normal Normal Normal Normal  R. Extensor hallucis longus Normal None 1+ None _______ Normal Normal 2+ Reduced  R. Abductor hallucis Normal None 2+ None _______ Normal Normal +2 Reduced  L. Vastus medialis Normal None None None _______ Normal Normal Normal Normal  L. Tibialis anterior Normal None None None _______ Normal Normal Normal Normal  L. Gastrocnemius (Medial head) Normal None None None _______ Normal Normal Normal Normal  L. Extensor hallucis longus Normal None 3+ None _______ Normal Normal 2+ Reduced  L. Abductor hallucis Normal None 3+ None _______ Normal Normal +2 Reduced

## 2022-08-07 ENCOUNTER — Ambulatory Visit: Payer: Medicare Other | Admitting: Cardiovascular Disease

## 2022-08-07 ENCOUNTER — Encounter: Payer: Self-pay | Admitting: Cardiovascular Disease

## 2022-08-07 VITALS — BP 136/68 | HR 85 | Ht 59.5 in | Wt 196.2 lb

## 2022-08-07 DIAGNOSIS — N1831 Chronic kidney disease, stage 3a: Secondary | ICD-10-CM | POA: Diagnosis not present

## 2022-08-07 DIAGNOSIS — I10 Essential (primary) hypertension: Secondary | ICD-10-CM | POA: Diagnosis not present

## 2022-08-07 DIAGNOSIS — E782 Mixed hyperlipidemia: Secondary | ICD-10-CM

## 2022-08-07 DIAGNOSIS — I2510 Atherosclerotic heart disease of native coronary artery without angina pectoris: Secondary | ICD-10-CM

## 2022-08-07 NOTE — Patient Instructions (Signed)
Medication Instructions:  Your physician recommends that you continue on your current medications as directed. Please refer to the Current Medication list given to you today.  *If you need a refill on your cardiac medications before your next appointment, please call your pharmacy*   Lab Work: NONE If you have labs (blood work) drawn today and your tests are completely normal, you will receive your results only by: MyChart Message (if you have MyChart) OR A paper copy in the mail If you have any lab test that is abnormal or we need to change your treatment, we will call you to review the results.   Testing/Procedures: NONE   Follow-Up: At [facility], you and your health needs are our priority.  As part of our continuing mission to provide you with exceptional heart care, we have created designated Provider Care Teams.  These Care Teams include your primary Cardiologist (physician) and Advanced Practice Providers (APPs -  Physician Assistants and Nurse Practitioners) who all work together to provide you with the care you need, when you need it.  We recommend signing up for the patient portal called "MyChart".  Sign up information is provided on this After Visit Summary.  MyChart is used to connect with patients for Virtual Visits (Telemedicine).  Patients are able to view lab/test results, encounter notes, upcoming appointments, etc.  Non-urgent messages can be sent to your provider as well.   To learn more about what you can do with MyChart, go to [email or web address].    Your next appointment:   1 year(s)  Provider:   Michael Cooper, MD      

## 2022-08-07 NOTE — Progress Notes (Signed)
Cardiology Office Note:    Date:  08/07/2022   ID:  Newton Pigg, DOB [birth year], MRN [id number]  PCP:  Daisy Floro, MD   [facility] Providers Cardiologist:  Tonny Bollman, MD     Referring MD: Daisy Floro, MD   Chief Complaint  Patient presents with   Coronary Artery Disease    History of Present Illness:    Gabrielle [name] is a 77 y.o. female with a hx of  CAD, anemia, arthritis, GERD, HLD, hypothyroidism, HTN, peripheral neuropathy, pre-DM, pulmonary nodules (followed by pulmonology), CKD 3a  who presents for follow-up. She initially presented in 2017 with an inferolateral STEMI found to have occlusion of the first OM branch of the circumflex, treated with primary PCI. She then underwent staged stenting of the RCA. 2D echo that showed EF 55-60%, normal diastolic function, no major abnormalities.   The patient is here with her husband today. She reports dyspnea with exertion, and attributes this to her weight. She doesn't understand why her weight is increasing, stating that her diet hasn't changed and she exercises more than in the past. She has had some limitation from neuropathy, and has been evaluated by Dr Lucia Gaskins. She has cut back on alcohol, but was drinking 3 glasses of wine per night.  She denies chest pain or pressure.  No edema, orthopnea, or PND.  Past Medical History:  Diagnosis Date   Anemia    Arthritis 01/17/2011   degenerative disc disease-all joints   Bursitis    CAD (coronary artery disease)    Cholecystitis with cholelithiasis 03/22/2018   Chronic kidney disease, stage 3a (HCC)    GERD (gastroesophageal reflux disease) 01/17/2011   tx. omeprazole   Heart murmur    slight murmur   Hyperlipidemia    Hypertension 01/17/2011   tx. meds   Hypothyroidism    Leg pain    ABIs 2/18: normal bilaterally   Neuropathy    FEET   Osteoarthritis    Peripheral neuropathy    Pre-diabetes    Pulmonary nodule    Seasonal allergies    Sinus  problem 01/17/2011   tx. Claritin D   ST elevation (STEMI) myocardial infarction involving left circumflex coronary artery (HCC) 08/21/2015    Past Surgical History:  Procedure Laterality Date   ABDOMINAL HYSTERECTOMY  01-17-11   '93-Hysterectomy-heavy bleeding   ANTERIOR LATERAL LUMBAR FUSION WITH PERCUTANEOUS SCREW 1 LEVEL N/A 03/07/2017   Procedure: XLIF L3-4;  Surgeon: Venita Lick, MD;  Location: [facility] OR;  Service: Orthopedics;  Laterality: N/A;  4 hrs   APPENDECTOMY     BACK SURGERY  01-17-11   04-20-10-T-Lift lumbar fusion   [facility] PROCEDURE  01-17-11   Bladder sling   CARDIAC CATHETERIZATION N/A 08/21/2015   Procedure: Left Heart Cath and Coronary Angiography;  Surgeon: Tonny Bollman, MD;  Location: [facility] INVASIVE CV LAB;  Service: Cardiovascular;  Laterality: N/A;   CARDIAC CATHETERIZATION N/A 08/21/2015   Procedure: Coronary Stent Intervention;  Surgeon: Tonny Bollman, MD;  Location: [facility] INVASIVE CV LAB;  Service: Cardiovascular;  Laterality: N/A;   CARDIAC CATHETERIZATION N/A 08/23/2015   Procedure: Coronary Stent Intervention;  Surgeon: Marykay Lex, MD;  Location: [facility] INVASIVE CV LAB;  Service: Cardiovascular;  Laterality: N/A;  promus 2.5x16 in RCA   CARDIAC CATHETERIZATION N/A 08/23/2015   Procedure: Left Heart Cath and Coronary Angiography;  Surgeon: Marykay Lex, MD;  Location: [facility] INVASIVE CV LAB;  Service: Cardiovascular;  Laterality: N/A;   CARPAL TUNNEL  RELEASE  01-17-11   '02-left, also right   CHOLECYSTECTOMY N/A 03/22/2018   Procedure: LAPAROSCOPIC CHOLECYSTECTOMY;  Surgeon: Claud Kelp, MD;  Location: [facility] OR;  Service: General;  Laterality: N/A;   COLONOSCOPY     HARDWARE REMOVAL Right 05/15/2013   Procedure: REMOVAL RIGHT L4-L5 PEDICLE SCREWS AND ROD ;  Surgeon: Venita Lick, MD;  Location: [facility] OR;  Service: Orthopedics;  Laterality: Right;   JOINT REPLACEMENT  01-17-11    RTKA' 02   LAPAROSCOPIC APPENDECTOMY N/A 08/13/2013   Procedure: APPENDECTOMY LAPAROSCOPIC;   Surgeon: Valarie Merino, MD;  Location: [facility] ORS;  Service: General;  Laterality: N/A;   TONSILLECTOMY  01-17-11   child   TOTAL KNEE ARTHROPLASTY  01/19/2011   Procedure: TOTAL KNEE ARTHROPLASTY;  Surgeon: Javier Docker;  Location: [facility] ORS;  Service: Orthopedics;  Laterality: Left;   TOTAL SHOULDER ARTHROPLASTY Right 06/05/2019   Procedure: TOTAL SHOULDER ARTHROPLASTY;  Surgeon: Francena Hanly, MD;  Location: [facility] ORS;  Service: Orthopedics;  Laterality: Right;     Current Medications: Current Meds  Medication Sig   acetaminophen (TYLENOL) 650 MG CR tablet Take 1,950 mg by mouth daily.   Alpha-Lipoic Acid 600 MG CAPS Take 1 capsule (600 mg total) by mouth 2 (two) times daily.   aspirin EC 81 MG tablet Take 81 mg by mouth at bedtime.    Bempedoic Acid-Ezetimibe (NEXLIZET) 180-10 MG TABS Take 1 tablet by mouth daily.   benazepril (LOTENSIN) 20 MG tablet Take 1 tablet (20 mg total) by mouth 2 (two) times daily.   Calcium Carbonate-Vitamin D (CALCIUM 600 + D PO) Take 1 tablet by mouth 2 (two) times daily.   Cholecalciferol (VITAMIN D) 2000 units CAPS Take 2,000 Units by mouth daily.   Cyanocobalamin (VITAMIN B-12 PO) Take 5,000 Units by mouth every other day.   docusate sodium (COLACE) 100 MG capsule Take 100 mg by mouth daily.   gabapentin (NEURONTIN) 600 MG tablet Take 600 mg by mouth 2 (two) times daily.   Glucosamine-Chondroit-Vit C-Mn (GLUCOSAMINE CHONDR 1500 COMPLX PO) Take 2 tablets by mouth daily. 1500/1200   icosapent Ethyl (VASCEPA) 1 g capsule Take 2 capsules (2 g total) by mouth 2 (two) times daily.   levothyroxine (SYNTHROID, LEVOTHROID) 75 MCG tablet Take 75 mcg by mouth daily before breakfast.   loratadine-pseudoephedrine (CLARITIN-D 24-HOUR) 10-240 MG per 24 hr tablet Take 1 tablet by mouth every evening.   meclizine (ANTIVERT) 25 MG tablet Take 25 mg by mouth 3 (three) times daily as needed for dizziness.   Melatonin 10 MG TABS Take 10 mg by mouth at bedtime.     metoprolol tartrate (LOPRESSOR) 25 MG tablet Take 0.5 tablets (12.5 mg total) by mouth 2 (two) times daily.   Misc Natural Products (GLUCOSAMINE CHONDROITIN ADV) TABS    nitroGLYCERIN (NITROSTAT) 0.4 MG SL tablet Place 0.4 mg under the tongue every 5 (five) minutes as needed for chest pain.   NON FORMULARY Place 1 drop under the tongue daily. CBD Oil   omeprazole (PRILOSEC) 20 MG capsule Take 20 mg by mouth daily with breakfast.   polyethylene glycol (MIRALAX / GLYCOLAX) packet Take 17 g by mouth daily as needed for mild constipation.    rosuvastatin (CRESTOR) 40 MG tablet TAKE 1 TABLET BY MOUTH EVERYDAY AT BEDTIME   triamterene-hydrochlorothiazide (MAXZIDE-25) 37.5-25 MG tablet 1 tablet in the morning     Allergies:   Pravastatin, Nsaids, Penicillins, and Pravastatin sodium   Social History   Socioeconomic History   Marital  status: Married    Spouse name: Not on file   Number of children: 3   Years of education: BSN+   Highest education level: Not on file  Occupational History   Occupation: Retired  Tobacco Use   Smoking status: Former    Packs/day: 1.00    Years: 30.00    Additional pack years: 0.00    Total pack years: 30.00    Types: Cigarettes    Start date: 68    Quit date: 08/13/1993    Years since quitting: 29.0   Smokeless tobacco: Never  Vaping Use   Vaping Use: Never used  Substance and Sexual Activity   Alcohol use: Yes    Alcohol/week: 2.0 - 3.0 standard drinks of alcohol    Types: 2 - 3 Glasses of wine per week    Comment: per day   Drug use: No   Sexual activity: Not Currently    Partners: Male    Comment: Married  Other Topics Concern   Not on file  Social History Narrative   Lives at home w/ her husband and granddaughter   Right-handed   Caffeine: 2 cups of tea daily   Social Determinants of Health   Financial Resource Strain: Not on file  Food Insecurity: Not on file  Transportation Needs: Not on file  Physical Activity: Not on file  Stress:  Not on file  Social Connections: Not on file     Family History: The patient's family history includes Aneurysm in her mother; Diabetes in her brother and sister; Heart attack in her brother; Heart disease in her brother and father; Hyperlipidemia in her brother and sister; Hypertension in her brother and sister. There is no history of Neuropathy, Urticaria, Allergic rhinitis, or Asthma.  ROS:   Please see the history of present illness.    All other systems reviewed and are negative.  EKGs/Labs/Other Studies Reviewed:    The following studies were reviewed today: Cardiac Studies & Procedures   CARDIAC CATHETERIZATION  CARDIAC CATHETERIZATION 08/21/2015  Narrative  Mid RCA lesion, 85% stenosed.  Ost LM lesion, 40% stenosed.  Prox LAD to Mid LAD lesion, 25% stenosed.  Ost Cx to Prox Cx lesion, 70% stenosed.  There is mild left ventricular systolic dysfunction.  1st Mrg lesion, 100% stenosed. Post intervention, there is a 0% residual stenosis.  1. Acute inferolateral STEMI secondary to total occlusion of the first OM branch. Is is treated successfully with primary PCI.  2. Mild nonobstructive disease of the left main and LAD  3. Severe stenosis of the mid RCA: Recommend staged PCI prior to discharge  4. Mild segmental contraction abnormality the left ventricle with preserved overall LVEF  Recommendations: Aspirin and brilinta 12 months, aggressive medical therapy, staged PCI the right coronary artery on Monday.  Findings Coronary Findings Diagnostic  Dominance: Right  Left Main Calcified eccentric. The proximal left main is widely patent. The distal left main has eccentric 40% stenosis present.  Left Anterior Descending Calcified diffuse. The LAD has no obstructive disease. The vessel wraps around the LV apex. The diagonal branches are patent. There is diffuse LAD calcification.  Left Circumflex The circumflex is heavily calcified and diffusely diseased. The  proximal circumflex has 60-70% stenosis. The mid circumflex has 50% stenosis. This extends into the first OM branch which is totally occluded. The AV circumflex beyond the OM has no significant obstruction.  First Obtuse Marginal Branch With heavy thrombus. The first OM is totally occluded. This is the patient&#39;s infarct  vessel. There is heavy thrombus.  Right Coronary Artery There is a tight stenosis in the mid RCA. This is relatively focal with mild irregularity throughout the remaining parts of the vessel. There is mild RCA calcification.  Intervention  1st Mrg lesion PCI There is no pre-interventional antegrade distal flow (TIMI 0). Pre-stent angioplasty was performed. A drug-eluting stent was placed. Post-stent angioplasty was performed. Maximum pressure: 14 atm. The post-interventional distal flow is normal (TIMI 3). The intervention was successful. No complications occurred at this lesion. A cougar guidewire is used to cross the lesion. The lesion is predilated with a 2.5 mm balloon. The circumflex is diffusely diseased with moderate to heavy calcification. The lesion is treated focally with a 2.5 x 16 mm Promus DES. The stent is deployed at 12 atm and postdilated with a 2.75 mm noncompliant balloon to 14 atm. The patient tolerated the procedure well. She was loaded with brilinta 180 mg on the table.  The proximal circumflex has moderate diffuse disease all the way back to the ostium. This would require a long overlapping stent in a diffusely calcified vessel. I felt it was best to treat this area medically. There is a 0% residual stenosis post intervention.   CARDIAC CATHETERIZATION  CARDIAC CATHETERIZATION 08/23/2015  Narrative Images from the original result were not included.   Mid RCA lesion, 85 %stenosed.  A STENT PROMUS PREM MR 2.5X16 drug eluting stent was successfully placed. Post intervention, there is a 0% residual stenosis.  1st Mrg stent appears to be widely  patent  Ost Cx to Prox Cx lesion, 70 %stenosed.   Successful focal PCI of mid RCA lesion with DES stent. Reviewed with Dr. Excell Seltzer, we both feel that the osteo-proximal circumflex lesion is a very difficult lesion to consider PCI as there is really minimal landing zone for stent.  This also somewhat calcified and likely chronic. The patient had not been having anginal symptoms prior to coming in, therefore it is unlikely to be flow-limiting. Plan for now is medical management.   Plan: Transfer to 6 central post procedure unit for TR band removal. Continue dual antiplatelet therapy. Continue aggressive risk factor modification.  Expected discharge tomorrow. Will need follow-up with Dr. Otila Back, M.D., M.S. Interventional Cardiologist  Pager # [phone] Phone # [phone] [address] [place], [place] [address]  Findings Coronary Findings Diagnostic  Dominance: Right  Left Main The lesion is eccentric. The lesion is calcified. The proximal left main is widely patent. The distal left main has eccentric 40% stenosis present.  Left Anterior Descending The lesion is segmental. The lesion is calcified. The LAD has no obstructive disease. The vessel wraps around the LV apex. The diagonal branches are patent. There is diffuse LAD calcification.  Left Circumflex The lesion is moderately calcified. Unlikely to be flow limiting The circumflex is heavily calcified and diffusely diseased. The proximal circumflex has 60-70% stenosis. The mid circumflex has 50% stenosis. This extends into the first OM branch which is totally occluded. The AV circumflex beyond the OM has no significant obstruction.  First Obtuse Marginal Branch Previously placed 1st Mrg drug eluting stent is widely patent. Widely patent stent The first OM is totally occluded. This is the patient&#39;s infarct vessel. There is heavy thrombus.  Right Coronary Artery There is a tight stenosis in  the mid RCA. This is relatively focal with mild irregularity throughout the remaining parts of the vessel. There is mild RCA calcification.  Acute Marginal Branch Vessel  is small in size.  Right Posterior Descending Artery Vessel is small in size.  First Right Posterolateral Branch Vessel is small in size.  Second Right Posterolateral Branch Vessel is small in size.  Intervention  Mid RCA lesion Angioplasty Lesion length: 14 mm. Lesion crossed with guidewire. Pre-stent angioplasty was performed using a BALLOON EMERGE MR [id number]. Maximum pressure: 8 atm. Inflation time: 30 sec. A STENT PROMUS PREM MR 2.5X16 drug eluting stent was successfully placed. Minimum lumen area: 2.8 mm. Stent strut is well apposed. Post-stent angioplasty was performed using a STENT PROMUS PREM MR 2.5X16. Maximum pressure: 18 atm. Inflation time: 30 sec. The pre-interventional distal flow is normal (TIMI 3).  The post-interventional distal flow is normal (TIMI 3). The intervention was successful . No complications occurred at this lesion. CATH VISTA GUIDE 6FR JR4 ([id number]) -- WIRE HI TORQ BMW 190CM ([id number] HC) There is a 0% residual stenosis post intervention.     ECHOCARDIOGRAM  ECHOCARDIOGRAM COMPLETE 02/15/2021  Narrative ECHOCARDIOGRAM REPORT    Patient Name:   EDA PELLMAN Date of Exam: 02/15/2021 Medical Rec #:  [id number]      Height:       60.0 in Accession #:    [id number]     Weight:       183.2 lb Date of Birth:  1945/04/16     BSA:          1.798 m Patient Age:    75 years       BP:           137/93 mmHg Patient Gender: F              HR:           86 bpm. Exam Location:  [address]  Procedure: 2D Echo, Cardiac Doppler, Color Doppler and Strain Analysis  Indications:    R06.00 Dyspnea  History:        Patient has prior history of Echocardiogram examinations, most recent 08/23/2015. CAD, CKD; Risk Factors:Hypertension and HLD.  Sonographer:    Clearence Ped RCS Referring Phys: [id number]  DAYNA N DUNN  IMPRESSIONS   1. Left ventricular ejection fraction, by estimation, is 60 to 65%. The left ventricle has normal function. The left ventricle has no regional wall motion abnormalities. Left ventricular diastolic parameters are consistent with Grade I diastolic dysfunction (impaired relaxation). 2. Right ventricular systolic function is normal. The right ventricular size is normal. There is normal pulmonary artery systolic pressure. 3. The mitral valve is normal in structure. No evidence of mitral valve regurgitation. No evidence of mitral stenosis. Moderate mitral annular calcification. 4. The aortic valve is tricuspid. There is mild calcification of the aortic valve. There is mild thickening of the aortic valve. Aortic valve regurgitation is not visualized. Aortic valve sclerosis is present, with no evidence of aortic valve stenosis. 5. The inferior vena cava is normal in size with greater than 50% respiratory variability, suggesting right atrial pressure of 3 mmHg.  Comparison(s): Prior images unable to be directly viewed, comparison made by report only.  FINDINGS Left Ventricle: Left ventricular ejection fraction, by estimation, is 60 to 65%. The left ventricle has normal function. The left ventricle has no regional wall motion abnormalities. The global longitudinal strain is normal despite suboptimal segment tracking. The left ventricular internal cavity size was normal in size. There is no left ventricular hypertrophy. Left ventricular diastolic parameters are consistent with Grade I diastolic dysfunction (impaired relaxation). Normal left ventricular filling pressure.  Right Ventricle: The  right ventricular size is normal. No increase in right ventricular wall thickness. Right ventricular systolic function is normal. There is normal pulmonary artery systolic pressure. The tricuspid regurgitant velocity is 2.45 m/s, and with an assumed right atrial pressure of 3 mmHg, the  estimated right ventricular systolic pressure is 27.0 mmHg.  Left Atrium: Left atrial size was normal in size.  Right Atrium: Right atrial size was normal in size.  Pericardium: There is no evidence of pericardial effusion.  Mitral Valve: The mitral valve is normal in structure. Moderate mitral annular calcification. No evidence of mitral valve regurgitation. No evidence of mitral valve stenosis.  Tricuspid Valve: The tricuspid valve is normal in structure. Tricuspid valve regurgitation is trivial. No evidence of tricuspid stenosis.  Aortic Valve: The aortic valve is tricuspid. There is mild calcification of the aortic valve. There is mild thickening of the aortic valve. Aortic valve regurgitation is not visualized. Aortic valve sclerosis is present, with no evidence of aortic valve stenosis.  Pulmonic Valve: The pulmonic valve was normal in structure. Pulmonic valve regurgitation is not visualized. No evidence of pulmonic stenosis.  Aorta: The aortic root is normal in size and structure.  Venous: The inferior vena cava is normal in size with greater than 50% respiratory variability, suggesting right atrial pressure of 3 mmHg.  IAS/Shunts: No atrial level shunt detected by color flow Doppler.   LEFT VENTRICLE PLAX 2D LVIDd:         3.00 cm   Diastology LVIDs:         2.00 cm   LV e' medial:    7.00 cm/s LV PW:         0.60 cm   LV E/e' medial:  9.9 LV IVS:        0.60 cm   LV e' lateral:   9.03 cm/s LVOT diam:     1.90 cm   LV E/e' lateral: 7.7 LV SV:         71 LV SV Index:   40        2D Longitudinal Strain LVOT Area:     2.84 cm  2D Strain GLS (A2C):   -12.8 % 2D Strain GLS (A3C):   -21.0 % 2D Strain GLS (A4C):   -15.3 % 2D Strain GLS Avg:     -16.4 %  RIGHT VENTRICLE RV Basal diam:  2.40 cm RV S prime:     13.20 cm/s TAPSE (M-mode): 2.0 cm RVSP:           27.0 mmHg  LEFT ATRIUM             Index        RIGHT ATRIUM           Index LA diam:        3.70 cm 2.06 cm/m    RA Pressure: 3.00 mmHg LA Vol (A2C):   36.4 ml 20.24 ml/m  RA Area:     7.92 cm LA Vol (A4C):   38.4 ml 21.35 ml/m  RA Volume:   12.50 ml  6.95 ml/m LA Biplane Vol: 38.0 ml 21.13 ml/m AORTIC VALVE LVOT Vmax:   124.00 cm/s LVOT Vmean:  92.000 cm/s LVOT VTI:    0.252 m  AORTA Ao Root diam: 3.10 cm Ao Asc diam:  3.20 cm  MITRAL VALVE                TRICUSPID VALVE MV Area (PHT):  TR Peak grad:   24.0 mmHg MV Decel Time:              TR Vmax:        245.00 cm/s MV E velocity: 69.20 cm/s   Estimated RAP:  3.00 mmHg MV A velocity: 123.00 cm/s  RVSP:           27.0 mmHg MV E/A ratio:  0.56 SHUNTS Systemic VTI:  0.25 m Systemic Diam: 1.90 cm  Rachelle Hora Croitoru MD Electronically signed by Thurmon Fair MD Signature Date/Time: 02/15/2021/2:49:42 PM    Final                  Recent Labs: No results found for requested labs within last 365 days.  Recent Lipid Panel    Component Value Date/Time   CHOL 146 10/27/2019 0813   TRIG 168 (H) 10/27/2019 0813   HDL 51 10/27/2019 0813   CHOLHDL 2.9 10/27/2019 0813   CHOLHDL 2.4 10/12/2015 0835   VLDL 28 10/12/2015 0835   LDLCALC 67 10/27/2019 0813     Risk Assessment/Calculations:                Physical Exam:    VS:  BP 136/68   Pulse 85   Ht 4' 11.5" (1.511 m)   Wt 196 lb 3.2 oz (89 kg)   SpO2 96%   BMI 38.96 kg/m     Wt Readings from Last 3 Encounters:  08/07/22 196 lb 3.2 oz (89 kg)  07/13/22 193 lb (87.5 kg)  06/06/22 194 lb 12.8 oz (88.4 kg)     GEN:  Well nourished, well developed in no acute distress HEENT: Normal NECK: No JVD; No carotid bruits LYMPHATICS: No lymphadenopathy CARDIAC: RRR, no murmurs, rubs, gallops RESPIRATORY:  Clear to auscultation without rales, wheezing or rhonchi  ABDOMEN: Soft, non-tender, non-distended MUSCULOSKELETAL:  No edema; No deformity  SKIN: Warm and dry NEUROLOGIC:  Alert and oriented x 3 PSYCHIATRIC:  Normal affect   ASSESSMENT:    1. Coronary  artery disease involving native coronary artery of native heart without angina pectoris   2. Essential hypertension   3. Stage 3a chronic kidney disease (HCC)   4. Mixed hyperlipidemia    PLAN:    In order of problems listed above:  The patient is stable with no symptoms of angina at present.  She will continue on low-dose aspirin for antiplatelet therapy.  Treated with rosuvastatin. Blood pressure is well-controlled.  Continue metoprolol, triamterene/HCTZ. Most recent creatinine is 1.32.  She has her upcoming annual physical in the near future and will have updated labs.  Discussed the importance of fluid hydration and avoidance of nephrotoxic drugs.   Treated with a high intensity statin drug on rosuvastatin 40 mg.  LDL cholesterol last year was 50 mg/dL.  We had a lengthy discussion about the importance of weight loss.  I think this is the primary issue causing increasing limitation from shortness of breath.  Her weight is increased 12 pounds compared to last year.  She is going to review whether she will be a candidate for Ozempic with her primary care physician when she sees him.  If needed, I am happy to arrange evaluation in our Pharm.D. clinic as well.  She will let us know.       Medication Adjustments/Labs and Tests Ordered: Current medicines are reviewed at length with the patient today.  Concerns regarding medicines are outlined above.  No orders of the defined types were placed in  this encounter.  No orders of the defined types were placed in this encounter.   There are no Patient Instructions on file for this visit.   Signed, Tonny Bollman, MD  08/07/2022 2:54 PM    [facility]

## 2022-08-16 DIAGNOSIS — N1832 Chronic kidney disease, stage 3b: Secondary | ICD-10-CM | POA: Diagnosis not present

## 2022-08-16 DIAGNOSIS — E538 Deficiency of other specified B group vitamins: Secondary | ICD-10-CM | POA: Diagnosis not present

## 2022-08-16 DIAGNOSIS — R7303 Prediabetes: Secondary | ICD-10-CM | POA: Diagnosis not present

## 2022-08-16 DIAGNOSIS — I739 Peripheral vascular disease, unspecified: Secondary | ICD-10-CM | POA: Diagnosis not present

## 2022-08-16 DIAGNOSIS — E782 Mixed hyperlipidemia: Secondary | ICD-10-CM | POA: Diagnosis not present

## 2022-08-16 DIAGNOSIS — Z0000 Encounter for general adult medical examination without abnormal findings: Secondary | ICD-10-CM | POA: Diagnosis not present

## 2022-08-16 DIAGNOSIS — E039 Hypothyroidism, unspecified: Secondary | ICD-10-CM | POA: Diagnosis not present

## 2022-08-16 DIAGNOSIS — I10 Essential (primary) hypertension: Secondary | ICD-10-CM | POA: Diagnosis not present

## 2022-08-16 DIAGNOSIS — G629 Polyneuropathy, unspecified: Secondary | ICD-10-CM | POA: Diagnosis not present

## 2022-08-18 ENCOUNTER — Encounter: Payer: Self-pay | Admitting: Cardiovascular Disease

## 2022-08-18 DIAGNOSIS — R7309 Other abnormal glucose: Secondary | ICD-10-CM

## 2022-09-04 ENCOUNTER — Other Ambulatory Visit: Payer: Self-pay

## 2022-09-04 ENCOUNTER — Other Ambulatory Visit (HOSPITAL_BASED_OUTPATIENT_CLINIC_OR_DEPARTMENT_OTHER): Payer: Self-pay

## 2022-09-04 ENCOUNTER — Other Ambulatory Visit (HOSPITAL_COMMUNITY): Payer: Self-pay

## 2022-09-04 ENCOUNTER — Ambulatory Visit: Payer: Medicare Other | Attending: Cardiovascular Disease | Admitting: Pharmacist

## 2022-09-04 VITALS — Wt 194.0 lb

## 2022-09-04 DIAGNOSIS — E669 Obesity, unspecified: Secondary | ICD-10-CM

## 2022-09-04 DIAGNOSIS — E119 Type 2 diabetes mellitus without complications: Secondary | ICD-10-CM

## 2022-09-04 MED ORDER — MOUNJARO 2.5 MG/0.5ML ~~LOC~~ SOAJ
2.5000 mg | SUBCUTANEOUS | 0 refills | Status: DC
  Filled 2022-09-04: qty 2, 28d supply, fill #0

## 2022-09-04 NOTE — Patient Instructions (Addendum)
Mounjaro Counseling Points This medication reduces your appetite and may make you feel fuller longer.  Stop eating when your body tells you that you are full. This will likely happen sooner than you are used to. Fried/greasy food and sweets may upset your stomach - minimize these as much as possible. Store your medication in the fridge until you are ready to use it. Inject your medication in the fatty tissue of your lower abdominal area (2 inches away from belly button) or upper outer thigh. Rotate injection sites. Common side effects include: nausea, diarrhea/constipation, and heartburn, and are more likely to occur if you overeat. Stop your injection for 7 days prior to surgical procedures requiring anesthesia.  Dosing: I will call you monthly to follow up with tolerability and dose increases. Greggory Keen is available in 2.5mg , 5mg , 7.5mg , 10mg , 12.5mg , and 15mg  once weekly subcutaneous weekly dosing.  Call me with any concerns before then at #[phone]  Tips for success: Write down the reasons why you want to lose weight and post it in a place where you'll see it often.  Start small and work your way up. Keep in mind that it takes time to achieve goals, and small steps add up.  Any additional movements help to burn calories. Taking the stairs rather than the elevator and parking at the far end of your parking lot are easy ways to start. Brisk walking for at least 30 minutes 4 or more days of the week is an excellent goal to work toward  Understanding what it means to feel full: Did you know that it can take 15 minutes or more for your brain to receive the message that you've eaten? That means that, if you eat less food, but consume it slower, you may still feel satisfied.  Eating a lot of fruits and vegetables can also help you feel fuller.  Eat off of smaller plates so that moderate portions don't seem too small  Tips for living a healthier life     Building a Healthy and Balanced  Diet Make most of your meal vegetables and fruits -  of your plate. Aim for color and variety, and remember that potatoes don't count as vegetables on the Healthy Eating Plate because of their negative impact on blood sugar.  Go for whole grains -  of your plate. Whole and intact grains--whole wheat, barley, wheat berries, quinoa, oats, brown rice, and foods made with them, such as whole wheat pasta--have a milder effect on blood sugar and insulin than white bread, white rice, and other refined grains.  Protein power -  of your plate. Fish, poultry, beans, and nuts are all healthy, versatile protein sources--they can be mixed into salads, and pair well with vegetables on a plate. Limit red meat, and avoid processed meats such as bacon and sausage.  Healthy plant oils - in moderation. Choose healthy vegetable oils like olive, canola, soy, corn, sunflower, peanut, and others, and avoid partially hydrogenated oils, which contain unhealthy trans fats. Remember that low-fat does not mean "healthy."  Drink water, coffee, or tea. Skip sugary drinks, limit milk and dairy products to one to two servings per day, and limit juice to a small glass per day.  Stay active. The red figure running across the Healthy Eating Plate's placemat is a reminder that staying active is also important in weight control.  The main message of the Healthy Eating Plate is to focus on diet quality:  The type of carbohydrate in the diet is more  important than the amount of carbohydrate in the diet, because some sources of carbohydrate--like vegetables (other than potatoes), fruits, whole grains, and beans--are healthier than others. The Healthy Eating Plate also advises consumers to avoid sugary beverages, a major source of calories--usually with little nutritional value--in the American diet. The Healthy Eating Plate encourages consumers to use healthy oils, and it does not set a maximum on the percentage of calories  people should get each day from healthy sources of fat. In this way, the Healthy Eating Plate recommends the opposite of the low-fat message promoted for decades by the USDA.  [employer]  SUGAR  Sugar is a huge problem in the modern day diet. Sugar is a big contributor to heart disease, diabetes, high triglyceride levels, fatty liver disease and obesity. Sugar is hidden in almost all packaged foods/beverages. Added sugar is extra sugar that is added beyond what is naturally found and has no nutritional benefit for your body. The American Heart Association recommends limiting added sugars to no more than 25g for women and 36 grams for men per day. There are many names for sugar including maltose, sucrose (names ending in "ose"), high fructose corn syrup, molasses, cane sugar, corn sweetener, raw sugar, syrup, honey or fruit juice concentrate.   One of the best ways to limit your added sugars is to stop drinking sweetened beverages such as soda, sweet tea, and fruit juice.  There is 65g of added sugars in one 20oz bottle of Coke! That is equal to 7.5 donuts.   Pay attention and read all nutrition facts labels. Below is an examples of a nutrition facts label. The #1 is showing you the total sugars where the # 2 is showing you the added sugars. This one serving has almost the max amount of added sugars per day!   EXERCISE  Exercise is good. We've all heard that. In an ideal world, we would all have time and resources to get plenty of it. When you are active, your heart pumps more efficiently and you will feel better.  Multiple studies show that even walking regularly has benefits that include living a longer life. The American Heart Association recommends 150 minutes per week of exercise (30 minutes per day most days of the week). You can do this in any increment you wish. Nine or more 10-minute walks count. So does an hour-long exercise class.  Break the time apart into what will work in your life. Some of the best things you can do include walking briskly, jogging, cycling or swimming laps. Not everyone is ready to "exercise." Sometimes we need to start with just getting active. Here are some easy ways to be more active throughout the day:  Take the stairs instead of the elevator  Go for a 10-15 minute walk during your lunch break (find a friend to make it more enjoyable)  When shopping, park at the back of the parking lot  If you take public transportation, get off one stop early and walk the extra distance  Pace around while making phone calls  Check with your doctor if you aren't sure what your limitations may be. Always remember to drink plenty of water when doing any type of exercise. Don't feel like a failure if you're not getting the 90-150 minutes per week. If you started by being a couch potato, then just a 10-minute walk each day is a huge improvement. Start with little victories and work your way up.   HEALTHY EATING TIPS  Plan ahead: make a menu of the meals for a week then create a grocery list to go with that menu. Consider meals that easily stretch into a night of leftovers, such as stews or casseroles. Or consider making two of your favorite meal and put one in the freezer for another night. Try a night or two each week that is "meatless" or "no cook" such as salads. When you get home from the grocery store wash and prepare your vegetables and fruits. Then when you need them they are ready to go.   Tips for going to the grocery store:  Buy store or generic brands  Check the weekly ad from your store on-line or in their in-store flyer  Look at the unit price on the shelf tag to compare/contrast the costs of different items  Buy fruits/vegetables in season  Carrots, bananas and apples are low-cost, naturally healthy items  If meats or frozen vegetables are on sale, buy some extras and put in your freezer   Limit buying prepared or "ready to eat" items, even if they are pre-made salads or fruit snacks  Do not shop when you're hungry  Foods at eye level tend to be more expensive. Look on the high and low shelves for deals.  Consider shopping at the farmer's market for fresh foods in season.  Avoid the cookie and chip aisles (these are expensive, high in calories and low in nutritional value). Shop on the outside of the grocery store.  Healthy food preparations:  If you can't get lean hamburger, be sure to drain the fat when cooking  Steam, saut (in olive oil), grill or bake foods  Experiment with different seasonings to avoid adding salt to your foods. Kosher salt, sea salt and Himalayan salt are all still salt and should be avoided. Try seasoning food with onion, garlic, thyme, rosemary, basil ect. Onion powder or garlic powder is ok. Avoid if it says salt (ie garlic salt).

## 2022-09-04 NOTE — Progress Notes (Signed)
Patient ID: Gabrielle [name]                 DOB: 19-Apr-1945                    MRN: [id number]     HPI: Gabrielle [name] is a 77 y.o. female patient referred to pharmacy clinic by Dr Excell Seltzer to initiate GLP1-RA therapy. PMH is significant for CAD s/p STEMI in 2017 s/p stenting of the RCA, HLD, HTN, CKD, peripheral neuropathy, hypothyroidism, anemia, arthritis, GERD and new diagnosis of T2DM with A1c 6.5% on 08/16/22 at [place], and obesity. Pt inquired about starting GLP1RA after recent DM diagnosis and was referred to PharmD to discuss.  Pt presents today with her husband. Gets Vascepa through [employer]. Reports being prediabetic for years, A1c just hit 6.5% diabetic threshold. PCP mentioned dietary intervention, pt interested in GLP.   Diet: Has met with nutritionist. Has been prediabetic for years. Watches sweets. No soda or sweet tea. 2-3 glasses of wine a day. Otherwise carbonated seltzer water. Eats at home- husbands cooks.  Exercise: Yoga daily for 30 minutes.  Family History:  Family History  Problem Relation Age of Onset   Aneurysm Mother    Heart disease Father    Diabetes Sister    Hypertension Sister    Hyperlipidemia Sister    Heart disease Brother    Hypertension Brother    Diabetes Brother    Hyperlipidemia Brother    Heart attack Brother    Neuropathy Neg Hx    Urticaria Neg Hx    Allergic rhinitis Neg Hx    Asthma Neg Hx     Social History: former smoker 1 PPD x 30 years, quit in 1995, 2-3 glasses of wine per day, no drug use.  Wt Readings from Last 1 Encounters:  08/07/22 196 lb 3.2 oz (89 kg)    BP Readings from Last 1 Encounters:  08/07/22 136/68   Pulse Readings from Last 1 Encounters:  08/07/22 85       Component Value Date/Time   CHOL 146 10/27/2019 0813   TRIG 168 (H) 10/27/2019 0813   HDL 51 10/27/2019 0813   CHOLHDL 2.9 10/27/2019 0813   CHOLHDL 2.4 10/12/2015 0835   VLDL 28 10/12/2015 0835   LDLCALC 67 10/27/2019 0813     Past Medical History:  Diagnosis Date   Anemia    Arthritis 01/17/2011   degenerative disc disease-all joints   Bursitis    CAD (coronary artery disease)    Cholecystitis with cholelithiasis 03/22/2018   Chronic kidney disease, stage 3a (HCC)    GERD (gastroesophageal reflux disease) 01/17/2011   tx. omeprazole   Heart murmur    slight murmur   Hyperlipidemia    Hypertension 01/17/2011   tx. meds   Hypothyroidism    Leg pain    ABIs 2/18: normal bilaterally   Neuropathy    FEET   Osteoarthritis    Peripheral neuropathy    Pre-diabetes    Pulmonary nodule    Seasonal allergies    Sinus problem 01/17/2011   tx. Claritin D   ST elevation (STEMI) myocardial infarction involving left circumflex coronary artery (HCC) 08/21/2015    Current Outpatient Medications on File Prior to Visit  Medication Sig Dispense Refill   acetaminophen (TYLENOL) 650 MG CR tablet Take 1,950 mg by mouth daily.     Alpha-Lipoic Acid 600 MG CAPS Take 1 capsule (600 mg total) by mouth 2 (two) times  daily.     aspirin EC 81 MG tablet Take 81 mg by mouth at bedtime.      Bempedoic Acid-Ezetimibe (NEXLIZET) 180-10 MG TABS Take 1 tablet by mouth daily. 90 tablet 3   benazepril (LOTENSIN) 20 MG tablet Take 1 tablet (20 mg total) by mouth 2 (two) times daily. 180 tablet 3   Calcium Carbonate-Vitamin D (CALCIUM 600 + D PO) Take 1 tablet by mouth 2 (two) times daily.     Cholecalciferol (VITAMIN D) 2000 units CAPS Take 2,000 Units by mouth daily.     Cyanocobalamin (VITAMIN B-12 PO) Take 5,000 Units by mouth every other day.     docusate sodium (COLACE) 100 MG capsule Take 100 mg by mouth daily.     gabapentin (NEURONTIN) 600 MG tablet Take 600 mg by mouth 2 (two) times daily.     Glucosamine-Chondroit-Vit C-Mn (GLUCOSAMINE CHONDR 1500 COMPLX PO) Take 2 tablets by mouth daily. 1500/1200     icosapent Ethyl (VASCEPA) 1 g capsule Take 2 capsules (2 g total) by mouth 2 (two) times daily. 360 capsule 3    levothyroxine (SYNTHROID, LEVOTHROID) 75 MCG tablet Take 75 mcg by mouth daily before breakfast.     loratadine-pseudoephedrine (CLARITIN-D 24-HOUR) 10-240 MG per 24 hr tablet Take 1 tablet by mouth every evening.     meclizine (ANTIVERT) 25 MG tablet Take 25 mg by mouth 3 (three) times daily as needed for dizziness.     Melatonin 10 MG TABS Take 10 mg by mouth at bedtime.      metoprolol tartrate (LOPRESSOR) 25 MG tablet Take 0.5 tablets (12.5 mg total) by mouth 2 (two) times daily. 90 tablet 3   Misc Natural Products (GLUCOSAMINE CHONDROITIN ADV) TABS      nitroGLYCERIN (NITROSTAT) 0.4 MG SL tablet Place 0.4 mg under the tongue every 5 (five) minutes as needed for chest pain.     NON FORMULARY Place 1 drop under the tongue daily. CBD Oil     omeprazole (PRILOSEC) 20 MG capsule Take 20 mg by mouth daily with breakfast.     polyethylene glycol (MIRALAX / GLYCOLAX) packet Take 17 g by mouth daily as needed for mild constipation.      rosuvastatin (CRESTOR) 40 MG tablet TAKE 1 TABLET BY MOUTH EVERYDAY AT BEDTIME 90 tablet 0   triamterene-hydrochlorothiazide (MAXZIDE-25) 37.5-25 MG tablet 1 tablet in the morning     No current facility-administered medications on file prior to visit.    Allergies  Allergen Reactions   Pravastatin Other (See Comments)    Effects liver function   Nsaids     Other reaction(s): intol   Penicillins Other (See Comments)    Allergy as an infant - no other information available Has patient had a PCN reaction causing immediate rash, facial/tongue/throat swelling, SOB or lightheadedness with hypotension: Unknown Has patient had a PCN reaction causing severe rash involving mucus membranes or skin necrosis: Unknown Has patient had a PCN reaction that required hospitalization: No Has patient had a PCN reaction occurring within the last 10 years: No If all of the above answers are "NO", then may proceed with Cephalospor   Pravastatin Sodium     Other reaction(s):  elevated LFT's     Assessment/Plan:  1. T2DM - Recently diagnosed, A1c 6.5%. Pt interested in GLP1-RA. Mounjaro prior auth approved through her insurance through 01/30/23, anticipated copay ~$294 with donut hole pricing which should go away next year. Reviewed glycemic, cardiovascular, and weight loss benefit with GLP along with  some data that alcohol use tends to decrease as well on GLPs - currently has 2-3 glasses of wine a night. Discussed that decreasing A1c and alcohol along with weight loss would also help improve her TG as well. She is interested in starting med, will start Mounjaro 2.5mg  SQ weekly.  Advised patient on common side effects including nausea, diarrhea, dyspepsia, decreased appetite, and fatigue. Counseled patient on reducing meal size and how to titrate medication to minimize side effects. Counseled patient to call if intolerable side effects or if experiencing dehydration, abdominal pain, or dizziness. Patient will adhere to dietary modifications and will target at least 150 minutes of moderate intensity exercise weekly.   Follow up in 1 month via telephone for tolerability update and dose titration.   E. , PharmD, BCACP, CPP [facility] [address] [place], [place] [address] Phone: [phone]; Fax: [phone] 09/04/2022 2:37 PM

## 2022-09-05 ENCOUNTER — Other Ambulatory Visit (HOSPITAL_BASED_OUTPATIENT_CLINIC_OR_DEPARTMENT_OTHER): Payer: Self-pay

## 2022-09-18 ENCOUNTER — Other Ambulatory Visit: Payer: Self-pay | Admitting: Cardiovascular Disease

## 2022-09-20 MED ORDER — ICOSAPENT ETHYL 1 G PO CAPS: 2.0000 g | ORAL_CAPSULE | Freq: Two times a day (BID) | ORAL | 3 refills | Status: DC

## 2022-09-25 ENCOUNTER — Other Ambulatory Visit (HOSPITAL_BASED_OUTPATIENT_CLINIC_OR_DEPARTMENT_OTHER): Payer: Self-pay

## 2022-09-25 ENCOUNTER — Telehealth: Payer: Self-pay | Admitting: Pharmacist

## 2022-09-25 MED ORDER — MOUNJARO 5 MG/0.5ML ~~LOC~~ SOAJ
5.0000 mg | SUBCUTANEOUS | 0 refills | Status: DC
  Filled 2022-09-25: qty 2, 28d supply, fill #0

## 2022-09-25 NOTE — Telephone Encounter (Signed)
Called pt to follow up with Mounjaro tolerability. Some indigestion, has improved with time. Definitely less hungry, thinks she's lost 3 lbs so far. Would like to increase her dose for next month, rx sent in.

## 2022-09-29 ENCOUNTER — Other Ambulatory Visit: Payer: Self-pay | Admitting: Cardiovascular Disease

## 2022-10-02 ENCOUNTER — Other Ambulatory Visit: Payer: Self-pay | Admitting: Cardiovascular Disease

## 2022-10-03 ENCOUNTER — Other Ambulatory Visit (HOSPITAL_BASED_OUTPATIENT_CLINIC_OR_DEPARTMENT_OTHER): Payer: Self-pay

## 2022-10-03 MED ORDER — AREXVY 120 MCG/0.5ML IM SUSR
INTRAMUSCULAR | 0 refills | Status: AC
  Filled 2022-10-03: qty 0.5, 1d supply, fill #0

## 2022-10-23 ENCOUNTER — Other Ambulatory Visit (HOSPITAL_BASED_OUTPATIENT_CLINIC_OR_DEPARTMENT_OTHER): Payer: Self-pay

## 2022-10-23 ENCOUNTER — Telehealth: Payer: Self-pay | Admitting: Cardiovascular Disease

## 2022-10-23 MED ORDER — MOUNJARO 7.5 MG/0.5ML ~~LOC~~ SOAJ
7.5000 mg | SUBCUTANEOUS | 0 refills | Status: DC
  Filled 2022-10-23: qty 2, 28d supply, fill #0

## 2022-10-23 NOTE — Telephone Encounter (Signed)
I spoke with pt (she is due for dose increase of Mounjaro as well). Had initial GI upset/nausea when she first increased her dose to 5mg , seems to be improving the longer she's been on the 5mg  dose. Has been giving injections at night. Has lost 11-12 lbs. She wishes to try increasing her dose.  She may be going out of town for a week. Refill sent in so she has enough med. Advised her to store on ice pack while traveling. She was appreciative for the call.

## 2022-10-23 NOTE — Telephone Encounter (Signed)
Pt c/o medication issue:  1. Name of Medication:   tirzepatide [facility]) 5 MG/0.5ML Pen    2. How are you currently taking this medication (dosage and times per day)?   3. Are you having a reaction (difficulty breathing--STAT)?   4. What is your medication issue? Patient is requesting call back to discuss medication and taking on vacation. Please advise.

## 2022-10-23 NOTE — Telephone Encounter (Signed)
Left a message to call back.

## 2022-11-20 ENCOUNTER — Telehealth: Payer: Self-pay | Admitting: Pharmacist

## 2022-11-20 ENCOUNTER — Other Ambulatory Visit (HOSPITAL_BASED_OUTPATIENT_CLINIC_OR_DEPARTMENT_OTHER): Payer: Self-pay

## 2022-11-20 MED ORDER — MOUNJARO 10 MG/0.5ML ~~LOC~~ SOAJ
10.0000 mg | SUBCUTANEOUS | 0 refills | Status: DC
  Filled 2022-11-20: qty 2, 28d supply, fill #0

## 2022-11-20 NOTE — Telephone Encounter (Signed)
Called pt to follow up with [facility]. Has lost 20 lbs in total. Has had to decrease portions to help improve med tolerability. When she overeats she feels nauseous and gets acid reflux. Yoga is easier for her to do now as well. Wishes to increase dose to 10mg  for next month, rx sent in.

## 2022-12-18 ENCOUNTER — Other Ambulatory Visit (HOSPITAL_BASED_OUTPATIENT_CLINIC_OR_DEPARTMENT_OTHER): Payer: Self-pay

## 2022-12-18 MED ORDER — MOUNJARO 12.5 MG/0.5ML ~~LOC~~ SOAJ
12.5000 mg | SUBCUTANEOUS | 0 refills | Status: DC
  Filled 2022-12-18: qty 2, 28d supply, fill #0

## 2022-12-18 NOTE — Addendum Note (Signed)
Addended by: Malena Peer D on: 12/18/2022 12:52 PM   Modules accepted: Orders

## 2022-12-18 NOTE — Telephone Encounter (Signed)
Spoke with patient. She is losing about 1 lb per week. Has lost 24-25lb total. Tolerating Mounjaro 10mg  well. No side effects. Would like to increase to 12.5mg . Rx sent to [facility]. Will f/u in 1 month.

## 2023-01-10 ENCOUNTER — Encounter: Payer: Self-pay | Admitting: Podiatry

## 2023-01-10 ENCOUNTER — Ambulatory Visit: Payer: Medicare Other | Admitting: Podiatry

## 2023-01-10 DIAGNOSIS — L600 Ingrowing nail: Secondary | ICD-10-CM | POA: Diagnosis not present

## 2023-01-10 NOTE — Progress Notes (Signed)
Subjective:  Patient ID: Gabrielle [name], female    DOB: Feb 28, 1945,  MRN: [id number]  Chief Complaint  Patient presents with   Foot Pain    She is having pain in the right foot, " its the big toe on the right foot, the sheets hurts the toe, she also has neuropathy.     77 y.o. female presents with the above complaint. Patient presents with right hallux medial border ingrown painful to touch is progressive gotten worse worse with ambulation worse with pressure patient would like for me to remove which he has not seen anyone as prior to seeing me pain scale 7 out of 10 dull aching nature   Review of Systems: Negative except as noted in the HPI. Denies N/V/F/Ch.  Past Medical History:  Diagnosis Date   Anemia    Arthritis 01/17/2011   degenerative disc disease-all joints   Bursitis    CAD (coronary artery disease)    Cholecystitis with cholelithiasis 03/22/2018   Chronic kidney disease, stage 3a (HCC)    GERD (gastroesophageal reflux disease) 01/17/2011   tx. omeprazole   Heart murmur    slight murmur   Hyperlipidemia    Hypertension 01/17/2011   tx. meds   Hypothyroidism    Leg pain    ABIs 2/18: normal bilaterally   Neuropathy    FEET   Osteoarthritis    Peripheral neuropathy    Pre-diabetes    Pulmonary nodule    Seasonal allergies    Sinus problem 01/17/2011   tx. Claritin D   ST elevation (STEMI) myocardial infarction involving left circumflex coronary artery (HCC) 08/21/2015    Current Outpatient Medications:    acetaminophen (TYLENOL) 650 MG CR tablet, Take 1,950 mg by mouth daily., Disp: , Rfl:    Alpha-Lipoic Acid 600 MG CAPS, Take 1 capsule (600 mg total) by mouth 2 (two) times daily., Disp: , Rfl:    aspirin EC 81 MG tablet, Take 81 mg by mouth at bedtime. , Disp: , Rfl:    benazepril (LOTENSIN) 20 MG tablet, Take 1 tablet (20 mg total) by mouth 2 (two) times daily., Disp: 180 tablet, Rfl: 3   Calcium Carbonate-Vitamin D (CALCIUM 600 + D PO), Take 1 tablet  by mouth 2 (two) times daily., Disp: , Rfl:    Cholecalciferol (VITAMIN D) 2000 units CAPS, Take 2,000 Units by mouth daily., Disp: , Rfl:    Cyanocobalamin (VITAMIN B-12 PO), Take 5,000 Units by mouth every other day., Disp: , Rfl:    docusate sodium (COLACE) 100 MG capsule, Take 100 mg by mouth daily., Disp: , Rfl:    gabapentin (NEURONTIN) 600 MG tablet, Take 600 mg by mouth 2 (two) times daily., Disp: , Rfl:    Glucosamine-Chondroit-Vit C-Mn (GLUCOSAMINE CHONDR 1500 COMPLX PO), Take 2 tablets by mouth daily. 1500/1200, Disp: , Rfl:    icosapent Ethyl (VASCEPA) 1 g capsule, Take 2 capsules (2 g total) by mouth 2 (two) times daily., Disp: 360 capsule, Rfl: 3   levothyroxine (SYNTHROID, LEVOTHROID) 75 MCG tablet, Take 75 mcg by mouth daily before breakfast., Disp: , Rfl:    loratadine-pseudoephedrine (CLARITIN-D 24-HOUR) 10-240 MG per 24 hr tablet, Take 1 tablet by mouth every evening., Disp: , Rfl:    meclizine (ANTIVERT) 25 MG tablet, Take 25 mg by mouth 3 (three) times daily as needed for dizziness., Disp: , Rfl:    Melatonin 10 MG TABS, Take 10 mg by mouth at bedtime. , Disp: , Rfl:    metoprolol tartrate (  LOPRESSOR) 25 MG tablet, Take 0.5 tablets (12.5 mg total) by mouth 2 (two) times daily., Disp: 90 tablet, Rfl: 3   Misc Natural Products (GLUCOSAMINE CHONDROITIN ADV) TABS, , Disp: , Rfl:    NEXLIZET 180-10 MG TABS, TAKE 1 TABLET BY MOUTH EVERY DAY, Disp: 90 tablet, Rfl: 3   nitroGLYCERIN (NITROSTAT) 0.4 MG SL tablet, Place 0.4 mg under the tongue every 5 (five) minutes as needed for chest pain., Disp: , Rfl:    NON FORMULARY, Place 1 drop under the tongue daily. CBD Oil, Disp: , Rfl:    omeprazole (PRILOSEC) 20 MG capsule, Take 20 mg by mouth daily with breakfast., Disp: , Rfl:    polyethylene glycol (MIRALAX / GLYCOLAX) packet, Take 17 g by mouth daily as needed for mild constipation. , Disp: , Rfl:    rosuvastatin (CRESTOR) 40 MG tablet, TAKE 1 TABLET BY MOUTH EVERYDAY AT BEDTIME, Disp:  90 tablet, Rfl: 3   RSV vaccine recomb adjuvanted (AREXVY) 120 MCG/0.5ML injection, Inject into the muscle., Disp: 0.5 mL, Rfl: 0   tirzepatide (MOUNJARO) 12.5 MG/0.5ML Pen, Inject 12.5 mg into the skin once a week., Disp: 2 mL, Rfl: 0   triamterene-hydrochlorothiazide (MAXZIDE-25) 37.5-25 MG tablet, 1 tablet in the morning, Disp: , Rfl:   Social History   Tobacco Use  Smoking Status Former   Current packs/day: 0.00   Average packs/day: 1 pack/day for 30.5 years (30.5 ttl pk-yrs)   Types: Cigarettes   Start date: 26   Quit date: 08/13/1993   Years since quitting: 29.4  Smokeless Tobacco Never    Allergies  Allergen Reactions   Pravastatin Other (See Comments)    Effects liver function   Nsaids     Other reaction(s): intol   Penicillins Other (See Comments)    Allergy as an infant - no other information available Has patient had a PCN reaction causing immediate rash, facial/tongue/throat swelling, SOB or lightheadedness with hypotension: Unknown Has patient had a PCN reaction causing severe rash involving mucus membranes or skin necrosis: Unknown Has patient had a PCN reaction that required hospitalization: No Has patient had a PCN reaction occurring within the last 10 years: No If all of the above answers are "NO", then may proceed with Cephalospor   Pravastatin Sodium     Other reaction(s): elevated LFT's   Objective:  There were no vitals filed for this visit. There is no height or weight on file to calculate BMI. Constitutional Well developed. Well nourished.  Vascular Dorsalis pedis pulses palpable bilaterally. Posterior tibial pulses palpable bilaterally. Capillary refill normal to all digits.  No cyanosis or clubbing noted. Pedal hair growth normal.  Neurologic Normal speech. Oriented to person, place, and time. Epicritic sensation to light touch grossly present bilaterally.  Dermatologic Painful ingrowing nail at medial nail borders of the hallux nail right. No  other open wounds. No skin lesions.  Orthopedic: Normal joint ROM without pain or crepitus bilaterally. No visible deformities. No bony tenderness.   Radiographs: None Assessment:   1. Ingrown toenail of right foot    Plan:  Patient was evaluated and treated and all questions answered.  Ingrown Nail, right -Patient elects to proceed with minor surgery to remove ingrown toenail removal today. Consent reviewed and signed by patient. -Ingrown nail excised. See procedure note. -Educated on post-procedure care including soaking. Written instructions provided and reviewed. -Patient to follow up in 2 weeks for nail check.  Procedure: Excision of Ingrown Toenail Location: Right 1st toe medial nail borders. Anesthesia:  Lidocaine 1% plain; 1.5 mL and Marcaine 0.5% plain; 1.5 mL, digital block. Skin Prep: Betadine. Dressing: Silvadene; telfa; dry, sterile, compression dressing. Technique: Following skin prep, the toe was exsanguinated and a tourniquet was secured at the base of the toe. The affected nail border was freed, split with a nail splitter, and excised. Chemical matrixectomy was then performed with phenol and irrigated out with alcohol. The tourniquet was then removed and sterile dressing applied. Disposition: Patient tolerated procedure well. Patient to return in 2 weeks for follow-up.   No follow-ups on file.

## 2023-01-16 NOTE — Telephone Encounter (Signed)
Called patient to follow up on Mounjaro tolerability. LVM for her to call back.

## 2023-01-17 ENCOUNTER — Other Ambulatory Visit (HOSPITAL_BASED_OUTPATIENT_CLINIC_OR_DEPARTMENT_OTHER): Payer: Self-pay

## 2023-01-17 MED ORDER — MOUNJARO 15 MG/0.5ML ~~LOC~~ SOAJ
15.0000 mg | SUBCUTANEOUS | 11 refills | Status: DC
  Filled 2023-01-17: qty 2, 28d supply, fill #0
  Filled 2023-02-16: qty 2, 28d supply, fill #1
  Filled 2023-03-07 – 2023-03-09 (×2): qty 2, 28d supply, fill #2
  Filled 2023-04-09: qty 2, 28d supply, fill #3
  Filled 2023-04-26 – 2023-04-30 (×2): qty 2, 28d supply, fill #4

## 2023-01-17 NOTE — Addendum Note (Signed)
Addended by: Malena Peer D on: 01/17/2023 09:52 AM   Modules accepted: Orders

## 2023-01-17 NOTE — Telephone Encounter (Signed)
Patient doing well on Mounjaro. Would like to increase to 15mg  weekly. Rx sent.

## 2023-02-15 ENCOUNTER — Ambulatory Visit
Admission: RE | Admit: 2023-02-15 | Discharge: 2023-02-15 | Disposition: A | Discharge status: Home / Self Care | Payer: Medicare Other | Source: Ambulatory Visit | Attending: Family Medicine | Admitting: Family Medicine

## 2023-02-15 ENCOUNTER — Other Ambulatory Visit: Payer: Self-pay | Admitting: Family Medicine

## 2023-02-15 DIAGNOSIS — I10 Essential (primary) hypertension: Secondary | ICD-10-CM | POA: Diagnosis not present

## 2023-02-15 DIAGNOSIS — M25572 Pain in left ankle and joints of left foot: Secondary | ICD-10-CM | POA: Diagnosis not present

## 2023-02-15 DIAGNOSIS — R7303 Prediabetes: Secondary | ICD-10-CM | POA: Diagnosis not present

## 2023-02-15 DIAGNOSIS — M25472 Effusion, left ankle: Secondary | ICD-10-CM

## 2023-02-15 DIAGNOSIS — M7989 Other specified soft tissue disorders: Secondary | ICD-10-CM | POA: Diagnosis not present

## 2023-02-15 DIAGNOSIS — G629 Polyneuropathy, unspecified: Secondary | ICD-10-CM | POA: Diagnosis not present

## 2023-02-15 DIAGNOSIS — N1832 Chronic kidney disease, stage 3b: Secondary | ICD-10-CM | POA: Diagnosis not present

## 2023-02-15 DIAGNOSIS — I959 Hypotension, unspecified: Secondary | ICD-10-CM | POA: Diagnosis not present

## 2023-02-15 DIAGNOSIS — R634 Abnormal weight loss: Secondary | ICD-10-CM | POA: Diagnosis not present

## 2023-02-16 ENCOUNTER — Other Ambulatory Visit (HOSPITAL_BASED_OUTPATIENT_CLINIC_OR_DEPARTMENT_OTHER): Payer: Self-pay

## 2023-02-21 ENCOUNTER — Ambulatory Visit: Payer: Medicare Other | Admitting: Podiatry

## 2023-02-21 ENCOUNTER — Encounter: Payer: Self-pay | Admitting: Podiatry

## 2023-02-21 DIAGNOSIS — L600 Ingrowing nail: Secondary | ICD-10-CM

## 2023-02-21 NOTE — Progress Notes (Signed)
Subjective: Gabrielle [name] is a 78 y.o.  female returns to office today for follow up evaluation after having right Hallux Medial border nail avulsion performed. Patient has been soaking using epsom salt and applying topical antibiotic covered with bandaid daily. Patient denies fevers, chills, nausea, vomiting. Denies any calf pain, chest pain, SOB.   Objective:  Vitals: Reviewed  General: Well developed, nourished, in no acute distress, alert and oriented x3   Dermatology: Skin is warm, dry and supple bilateral. Medial hallux nail border appears to be clean, dry, with mild granular tissue and surrounding scab. There is no surrounding erythema, edema, drainage/purulence. The remaining nails appear unremarkable at this time. There are no other lesions or other signs of infection present.  Neurovascular status: Intact. No lower extremity swelling; No pain with calf compression bilateral.  Musculoskeletal: Decreased tenderness to palpation of the Medial hallux nail fold(s). Muscular strength within normal limits bilateral.   Assesement and Plan: S/p partial nail avulsion, doing well.   -Continue soaking in epsom salts twice a day followed by antibiotic ointment and a band-aid as needed. Can leave uncovered at night. .  -If the area does not heal properply, call the office for follow-up appointment, or sooner if any problems arise.  -Monitor for any signs/symptoms of infection. Call the office immediately if any occur or go directly to the emergency room. Call with any questions/concerns.  Nicholes Rough, DPM

## 2023-02-28 DIAGNOSIS — S99912A Unspecified injury of left ankle, initial encounter: Secondary | ICD-10-CM | POA: Diagnosis not present

## 2023-03-05 DIAGNOSIS — R0602 Shortness of breath: Secondary | ICD-10-CM | POA: Diagnosis not present

## 2023-03-05 DIAGNOSIS — J209 Acute bronchitis, unspecified: Secondary | ICD-10-CM | POA: Diagnosis not present

## 2023-03-05 DIAGNOSIS — R051 Acute cough: Secondary | ICD-10-CM | POA: Diagnosis not present

## 2023-03-05 DIAGNOSIS — R509 Fever, unspecified: Secondary | ICD-10-CM | POA: Diagnosis not present

## 2023-03-06 ENCOUNTER — Other Ambulatory Visit (HOSPITAL_BASED_OUTPATIENT_CLINIC_OR_DEPARTMENT_OTHER): Payer: Self-pay

## 2023-03-06 ENCOUNTER — Other Ambulatory Visit: Payer: Self-pay

## 2023-03-06 MED ORDER — OSELTAMIVIR PHOSPHATE 75 MG PO CAPS
75.0000 mg | ORAL_CAPSULE | Freq: Two times a day (BID) | ORAL | 0 refills | Status: DC
  Filled 2023-03-06: qty 10, 5d supply, fill #0

## 2023-03-07 ENCOUNTER — Other Ambulatory Visit (HOSPITAL_BASED_OUTPATIENT_CLINIC_OR_DEPARTMENT_OTHER): Payer: Self-pay

## 2023-03-09 ENCOUNTER — Ambulatory Visit: Payer: Medicare Other | Admitting: Podiatry

## 2023-03-09 DIAGNOSIS — H2513 Age-related nuclear cataract, bilateral: Secondary | ICD-10-CM | POA: Diagnosis not present

## 2023-03-11 ENCOUNTER — Emergency Department (HOSPITAL_COMMUNITY): Payer: Medicare Other

## 2023-03-11 ENCOUNTER — Other Ambulatory Visit: Payer: Self-pay

## 2023-03-11 ENCOUNTER — Inpatient Hospital Stay (HOSPITAL_COMMUNITY)
Admission: EM | Admit: 2023-03-11 | Discharge: 2023-03-15 | DRG: 193 | Disposition: A | Discharge status: Home Health | Payer: Medicare Other | Attending: Family Medicine | Admitting: Family Medicine

## 2023-03-11 DIAGNOSIS — R0902 Hypoxemia: Secondary | ICD-10-CM | POA: Diagnosis not present

## 2023-03-11 DIAGNOSIS — N1831 Chronic kidney disease, stage 3a: Secondary | ICD-10-CM | POA: Diagnosis not present

## 2023-03-11 DIAGNOSIS — I10 Essential (primary) hypertension: Secondary | ICD-10-CM | POA: Diagnosis not present

## 2023-03-11 DIAGNOSIS — E1122 Type 2 diabetes mellitus with diabetic chronic kidney disease: Secondary | ICD-10-CM | POA: Diagnosis present

## 2023-03-11 DIAGNOSIS — Z6833 Body mass index (BMI) 33.0-33.9, adult: Secondary | ICD-10-CM

## 2023-03-11 DIAGNOSIS — E1142 Type 2 diabetes mellitus with diabetic polyneuropathy: Secondary | ICD-10-CM | POA: Diagnosis present

## 2023-03-11 DIAGNOSIS — E669 Obesity, unspecified: Secondary | ICD-10-CM | POA: Diagnosis present

## 2023-03-11 DIAGNOSIS — Z832 Family history of diseases of the blood and blood-forming organs and certain disorders involving the immune mechanism: Secondary | ICD-10-CM

## 2023-03-11 DIAGNOSIS — I5032 Chronic diastolic (congestive) heart failure: Secondary | ICD-10-CM | POA: Diagnosis present

## 2023-03-11 DIAGNOSIS — Z7984 Long term (current) use of oral hypoglycemic drugs: Secondary | ICD-10-CM

## 2023-03-11 DIAGNOSIS — Z79899 Other long term (current) drug therapy: Secondary | ICD-10-CM

## 2023-03-11 DIAGNOSIS — Z87891 Personal history of nicotine dependence: Secondary | ICD-10-CM

## 2023-03-11 DIAGNOSIS — K219 Gastro-esophageal reflux disease without esophagitis: Secondary | ICD-10-CM | POA: Diagnosis not present

## 2023-03-11 DIAGNOSIS — E119 Type 2 diabetes mellitus without complications: Secondary | ICD-10-CM

## 2023-03-11 DIAGNOSIS — I252 Old myocardial infarction: Secondary | ICD-10-CM | POA: Diagnosis not present

## 2023-03-11 DIAGNOSIS — Z743 Need for continuous supervision: Secondary | ICD-10-CM | POA: Diagnosis not present

## 2023-03-11 DIAGNOSIS — Z833 Family history of diabetes mellitus: Secondary | ICD-10-CM

## 2023-03-11 DIAGNOSIS — I2510 Atherosclerotic heart disease of native coronary artery without angina pectoris: Secondary | ICD-10-CM | POA: Diagnosis not present

## 2023-03-11 DIAGNOSIS — R1030 Lower abdominal pain, unspecified: Secondary | ICD-10-CM | POA: Diagnosis not present

## 2023-03-11 DIAGNOSIS — R109 Unspecified abdominal pain: Secondary | ICD-10-CM

## 2023-03-11 DIAGNOSIS — N179 Acute kidney failure, unspecified: Secondary | ICD-10-CM | POA: Diagnosis not present

## 2023-03-11 DIAGNOSIS — Z7985 Long-term (current) use of injectable non-insulin antidiabetic drugs: Secondary | ICD-10-CM

## 2023-03-11 DIAGNOSIS — Z888 Allergy status to other drugs, medicaments and biological substances status: Secondary | ICD-10-CM

## 2023-03-11 DIAGNOSIS — G629 Polyneuropathy, unspecified: Secondary | ICD-10-CM | POA: Diagnosis not present

## 2023-03-11 DIAGNOSIS — Z96653 Presence of artificial knee joint, bilateral: Secondary | ICD-10-CM | POA: Diagnosis present

## 2023-03-11 DIAGNOSIS — E860 Dehydration: Secondary | ICD-10-CM

## 2023-03-11 DIAGNOSIS — Z8249 Family history of ischemic heart disease and other diseases of the circulatory system: Secondary | ICD-10-CM

## 2023-03-11 DIAGNOSIS — Z96611 Presence of right artificial shoulder joint: Secondary | ICD-10-CM | POA: Diagnosis not present

## 2023-03-11 DIAGNOSIS — J9601 Acute respiratory failure with hypoxia: Secondary | ICD-10-CM | POA: Diagnosis not present

## 2023-03-11 DIAGNOSIS — E785 Hyperlipidemia, unspecified: Secondary | ICD-10-CM | POA: Diagnosis not present

## 2023-03-11 DIAGNOSIS — E871 Hypo-osmolality and hyponatremia: Secondary | ICD-10-CM | POA: Diagnosis present

## 2023-03-11 DIAGNOSIS — Z79890 Hormone replacement therapy (postmenopausal): Secondary | ICD-10-CM

## 2023-03-11 DIAGNOSIS — E039 Hypothyroidism, unspecified: Secondary | ICD-10-CM | POA: Diagnosis present

## 2023-03-11 DIAGNOSIS — E1151 Type 2 diabetes mellitus with diabetic peripheral angiopathy without gangrene: Secondary | ICD-10-CM | POA: Diagnosis present

## 2023-03-11 DIAGNOSIS — Z886 Allergy status to analgesic agent status: Secondary | ICD-10-CM

## 2023-03-11 DIAGNOSIS — R059 Cough, unspecified: Secondary | ICD-10-CM | POA: Diagnosis not present

## 2023-03-11 DIAGNOSIS — R404 Transient alteration of awareness: Secondary | ICD-10-CM | POA: Diagnosis not present

## 2023-03-11 DIAGNOSIS — R079 Chest pain, unspecified: Secondary | ICD-10-CM | POA: Diagnosis not present

## 2023-03-11 DIAGNOSIS — R918 Other nonspecific abnormal finding of lung field: Secondary | ICD-10-CM | POA: Diagnosis not present

## 2023-03-11 DIAGNOSIS — R197 Diarrhea, unspecified: Secondary | ICD-10-CM

## 2023-03-11 DIAGNOSIS — J159 Unspecified bacterial pneumonia: Principal | ICD-10-CM | POA: Diagnosis present

## 2023-03-11 DIAGNOSIS — R6889 Other general symptoms and signs: Secondary | ICD-10-CM | POA: Diagnosis not present

## 2023-03-11 DIAGNOSIS — E66812 Obesity, class 2: Secondary | ICD-10-CM | POA: Diagnosis present

## 2023-03-11 DIAGNOSIS — Z1152 Encounter for screening for COVID-19: Secondary | ICD-10-CM

## 2023-03-11 DIAGNOSIS — I130 Hypertensive heart and chronic kidney disease with heart failure and stage 1 through stage 4 chronic kidney disease, or unspecified chronic kidney disease: Secondary | ICD-10-CM | POA: Diagnosis not present

## 2023-03-11 DIAGNOSIS — R0689 Other abnormalities of breathing: Secondary | ICD-10-CM | POA: Diagnosis not present

## 2023-03-11 DIAGNOSIS — R110 Nausea: Principal | ICD-10-CM

## 2023-03-11 DIAGNOSIS — Z7982 Long term (current) use of aspirin: Secondary | ICD-10-CM

## 2023-03-11 DIAGNOSIS — J189 Pneumonia, unspecified organism: Secondary | ICD-10-CM | POA: Diagnosis not present

## 2023-03-11 DIAGNOSIS — Z880 Allergy status to penicillin: Secondary | ICD-10-CM

## 2023-03-11 DIAGNOSIS — Z83438 Family history of other disorder of lipoprotein metabolism and other lipidemia: Secondary | ICD-10-CM

## 2023-03-11 LAB — COMPREHENSIVE METABOLIC PANEL
ALT: 32 U/L (ref 0–44)
AST: 34 U/L (ref 15–41)
Albumin: 3.2 g/dL — ABNORMAL LOW (ref 3.5–5.0)
Alkaline Phosphatase: 73 U/L (ref 38–126)
Anion gap: 14 (ref 5–15)
BUN: 66 mg/dL — ABNORMAL HIGH (ref 8–23)
CO2: 22 mmol/L (ref 22–32)
Calcium: 8.6 mg/dL — ABNORMAL LOW (ref 8.9–10.3)
Chloride: 94 mmol/L — ABNORMAL LOW (ref 98–111)
Creatinine, Ser: 1.6 mg/dL — ABNORMAL HIGH (ref 0.44–1.00)
GFR, Estimated: 33 mL/min — ABNORMAL LOW (ref >60)
Glucose, Bld: 110 mg/dL — ABNORMAL HIGH (ref 70–99)
Potassium: 3.5 mmol/L (ref 3.5–5.1)
Sodium: 130 mmol/L — ABNORMAL LOW (ref 135–145)
Total Bilirubin: 0.9 mg/dL (ref 0.0–1.2)
Total Protein: 6.2 g/dL — ABNORMAL LOW (ref 6.5–8.1)

## 2023-03-11 LAB — CBC WITH DIFFERENTIAL/PLATELET
Abs Immature Granulocytes: 0.12 10*3/uL — ABNORMAL HIGH (ref 0.00–0.07)
Basophils Absolute: 0 10*3/uL (ref 0.0–0.1)
Basophils Relative: 0 %
Eosinophils Absolute: 0.1 10*3/uL (ref 0.0–0.5)
Eosinophils Relative: 0 %
HCT: 40.3 % (ref 36.0–46.0)
Hemoglobin: 14.1 g/dL (ref 12.0–15.0)
Immature Granulocytes: 1 %
Lymphocytes Relative: 9 %
Lymphs Abs: 1.3 10*3/uL (ref 0.7–4.0)
MCH: 31 pg (ref 26.0–34.0)
MCHC: 35 g/dL (ref 30.0–36.0)
MCV: 88.6 fL (ref 80.0–100.0)
Monocytes Absolute: 1.5 10*3/uL — ABNORMAL HIGH (ref 0.1–1.0)
Monocytes Relative: 11 %
Neutro Abs: 11.5 10*3/uL — ABNORMAL HIGH (ref 1.7–7.7)
Neutrophils Relative %: 79 %
Platelets: 298 10*3/uL (ref 150–400)
RBC: 4.55 MIL/uL (ref 3.87–5.11)
RDW: 12.8 % (ref 11.5–15.5)
WBC: 14.4 10*3/uL — ABNORMAL HIGH (ref 4.0–10.5)
nRBC: 0 % (ref 0.0–0.2)

## 2023-03-11 LAB — LIPASE, BLOOD: Lipase: 47 U/L (ref 11–51)

## 2023-03-11 LAB — RESP PANEL BY RT-PCR (RSV, FLU A&B, COVID)  RVPGX2
Influenza A by PCR: NEGATIVE
Influenza B by PCR: NEGATIVE
Resp Syncytial Virus by PCR: NEGATIVE
SARS Coronavirus 2 by RT PCR: NEGATIVE

## 2023-03-11 LAB — TROPONIN I (HIGH SENSITIVITY)
Troponin I (High Sensitivity): 38 ng/L — ABNORMAL HIGH (ref <18)
Troponin I (High Sensitivity): 50 ng/L — ABNORMAL HIGH (ref <18)

## 2023-03-11 LAB — I-STAT CG4 LACTIC ACID, ED: Lactic Acid, Venous: 1.5 mmol/L (ref 0.5–1.9)

## 2023-03-11 MED ORDER — FENTANYL CITRATE PF 50 MCG/ML IJ SOSY
50.0000 ug | PREFILLED_SYRINGE | Freq: Once | INTRAMUSCULAR | Status: AC
  Administered 2023-03-11: 50 ug via INTRAVENOUS
  Filled 2023-03-11: qty 1

## 2023-03-11 MED ORDER — IOHEXOL 300 MG/ML  SOLN
75.0000 mL | Freq: Once | INTRAMUSCULAR | Status: AC | PRN
  Administered 2023-03-11: 75 mL via INTRAVENOUS

## 2023-03-11 MED ORDER — HYDROCODONE-ACETAMINOPHEN 5-325 MG PO TABS
1.0000 | ORAL_TABLET | Freq: Four times a day (QID) | ORAL | Status: DC | PRN
  Administered 2023-03-11 – 2023-03-12 (×2): 1 via ORAL
  Filled 2023-03-11 (×2): qty 1

## 2023-03-11 MED ORDER — SODIUM CHLORIDE 0.9 % IV SOLN
1.0000 g | Freq: Once | INTRAVENOUS | Status: AC
  Administered 2023-03-11: 1 g via INTRAVENOUS
  Filled 2023-03-11: qty 10

## 2023-03-11 MED ORDER — LORAZEPAM 2 MG/ML IJ SOLN
0.5000 mg | Freq: Once | INTRAMUSCULAR | Status: AC
  Administered 2023-03-11: 0.5 mg via INTRAVENOUS
  Filled 2023-03-11: qty 1

## 2023-03-11 MED ORDER — SODIUM CHLORIDE 0.9 % IV SOLN
500.0000 mg | Freq: Once | INTRAVENOUS | Status: AC
  Administered 2023-03-11: 500 mg via INTRAVENOUS
  Filled 2023-03-11: qty 5

## 2023-03-11 MED ORDER — ONDANSETRON HCL 4 MG/2ML IJ SOLN
4.0000 mg | Freq: Once | INTRAMUSCULAR | Status: AC
  Administered 2023-03-11: 4 mg via INTRAVENOUS
  Filled 2023-03-11: qty 2

## 2023-03-11 MED ORDER — SODIUM CHLORIDE 0.9 % IV BOLUS
1000.0000 mL | Freq: Once | INTRAVENOUS | Status: AC
  Administered 2023-03-11: 1000 mL via INTRAVENOUS

## 2023-03-11 NOTE — ED Triage Notes (Addendum)
 Pt BIBS from home for fatigue, diarrhea 3-4 days. Very nauseous but no vomiting. Had flu and pna a few weeks ago, was feeling better until 4 days ago when this started. 20ga rf, 500cc NS, 200 LR,4 zofran  with improvement. Rhonchi lower fields. Can't keep abx down last 4 days.  110/80 orthostatic  118/86 HR 99 RR 36 Etco2 20 Cbg 158 98% RA

## 2023-03-11 NOTE — ED Provider Notes (Signed)
 [facility] AT [facility] Provider Note   CSN: [id number] Arrival date & time: 03/11/23  1428     History  Chief Complaint  Patient presents with   Diarrhea   Weakness    Gabrielle [name] is a 78 y.o. female.  The history is provided by the patient and medical records. No language interpreter was used.  Diarrhea Quality:  Watery Severity:  Moderate Onset quality:  Gradual Number of episodes:  4 Timing:  Constant Progression:  Worsening Relieved by:  Nothing Worsened by:  Nothing Ineffective treatments:  None tried Associated symptoms: abdominal pain, chills, cough and URI   Associated symptoms: no diaphoresis, no fever, no headaches and no vomiting   Risk factors: recent antibiotic use   Weakness Associated symptoms: abdominal pain, cough, diarrhea, dysuria, nausea and shortness of breath   Associated symptoms: no chest pain, no fever, no headaches and no vomiting        Home Medications Prior to Admission medications   Medication Sig Start Date End Date Taking? Authorizing Provider  acetaminophen  (TYLENOL ) 650 MG CR tablet Take 1,950 mg by mouth daily.    [provider]  Alpha-Lipoic Acid 600 MG CAPS Take 1 capsule (600 mg total) by mouth 2 (two) times daily. 05/15/22   Ines Onetha NOVAK, MD  aspirin  EC 81 MG tablet Take 81 mg by mouth at bedtime.     [provider]  benazepril  (LOTENSIN ) 20 MG tablet Take 1 tablet (20 mg total) by mouth 2 (two) times daily. 11/13/18   Lelon Hamilton T, PA-C  Calcium  Carbonate-Vitamin D (CALCIUM  600 + D PO) Take 1 tablet by mouth 2 (two) times daily.    [provider]  Cholecalciferol (VITAMIN D) 2000 units CAPS Take 2,000 Units by mouth daily.    [provider]  Cyanocobalamin  (VITAMIN B-12 PO) Take 5,000 Units by mouth every other day. 02/07/21   [provider]  docusate sodium  (COLACE) 100 MG capsule Take 100 mg by mouth daily.    [provider]   gabapentin  (NEURONTIN ) 600 MG tablet Take 600 mg by mouth 2 (two) times daily.    [provider]  Glucosamine-Chondroit-Vit C-Mn (GLUCOSAMINE CHONDR 1500 COMPLX PO) Take 2 tablets by mouth daily. 1500/1200    [provider]  icosapent  Ethyl (VASCEPA ) 1 g capsule Take 2 capsules (2 g total) by mouth 2 (two) times daily. 09/20/22   Wonda Sharper, MD  levothyroxine  (SYNTHROID , LEVOTHROID) 75 MCG tablet Take 75 mcg by mouth daily before breakfast.    [provider]  loratadine -pseudoephedrine  (CLARITIN -D 24-HOUR) 10-240 MG per 24 hr tablet Take 1 tablet by mouth every evening.    [provider]  meclizine  (ANTIVERT ) 25 MG tablet Take 25 mg by mouth 3 (three) times daily as needed for dizziness.    [provider]  Melatonin 10 MG TABS Take 10 mg by mouth at bedtime.     [provider]  metoprolol  tartrate (LOPRESSOR ) 25 MG tablet Take 0.5 tablets (12.5 mg total) by mouth 2 (two) times daily. 06/13/19   Lelon Hamilton DASEN, PA-C  Misc Natural Products (GLUCOSAMINE CHONDROITIN ADV) TABS     [provider]  NEXLIZET  180-10 MG TABS TAKE 1 TABLET BY MOUTH EVERY DAY 10/03/22   Wonda Sharper, MD  nitroGLYCERIN  (NITROSTAT ) 0.4 MG SL tablet Place 0.4 mg under the tongue every 5 (five) minutes as needed for chest pain.    [provider]  NON FORMULARY Place  1 drop under the tongue daily. CBD Oil    [provider]  omeprazole (PRILOSEC) 20 MG capsule Take 20 mg by mouth daily with breakfast.    [provider]  oseltamivir  (TAMIFLU ) 75 MG capsule Take 1 capsule (75 mg total) by mouth 2 (two) times daily for 5 days. 03/05/23 03/11/23    polyethylene glycol (MIRALAX  / GLYCOLAX ) packet Take 17 g by mouth daily as needed for mild constipation.     [provider]  rosuvastatin  (CRESTOR ) 40 MG tablet TAKE 1 TABLET BY MOUTH EVERYDAY AT BEDTIME 10/03/22   Wonda Sharper, MD  RSV vaccine recomb adjuvanted (AREXVY ) 120  MCG/0.5ML injection Inject into the muscle. 10/03/22   Luiz Channel, MD  tirzepatide  (MOUNJARO ) 15 MG/0.5ML Pen Inject 15 mg into the skin once a week. 01/17/23   Wonda Sharper, MD  triamterene -hydrochlorothiazide  (MAXZIDE -25) 37.5-25 MG tablet 1 tablet in the morning 06/28/19   [provider]      Allergies    Pravastatin, Nsaids, Penicillins, and Pravastatin sodium    Review of Systems   Review of Systems  Constitutional:  Positive for appetite change, chills and fatigue. Negative for diaphoresis and fever.  HENT:  Negative for congestion.   Eyes:  Negative for visual disturbance.  Respiratory:  Positive for cough, chest tightness and shortness of breath. Negative for wheezing.   Cardiovascular:  Negative for chest pain, palpitations and leg swelling.  Gastrointestinal:  Positive for abdominal pain, diarrhea and nausea. Negative for constipation and vomiting.  Genitourinary:  Positive for dysuria.  Musculoskeletal:  Negative for back pain, neck pain and neck stiffness.  Skin:  Negative for rash and wound.  Neurological:  Positive for weakness and light-headedness. Negative for syncope and headaches.  Psychiatric/Behavioral:  Negative for agitation.   All other systems reviewed and are negative.   Physical Exam Updated Vital Signs BP 129/71 (BP Location: Left Arm)   Pulse 94   Temp 97.6 F (36.4 C) (Oral)   Resp (!) 25   Ht 4' 11.5 (1.511 m)   Wt 68.5 kg   SpO2 99%   BMI 29.99 kg/m  Physical Exam Vitals and nursing note reviewed.  Constitutional:      General: She is not in acute distress.    Appearance: She is well-developed. She is not ill-appearing, toxic-appearing or diaphoretic.  HENT:     Head: Normocephalic and atraumatic.     Nose: No congestion or rhinorrhea.     Mouth/Throat:     Mouth: Mucous membranes are dry.     Pharynx: No oropharyngeal exudate or posterior oropharyngeal erythema.  Eyes:     Extraocular Movements: Extraocular movements  intact.     Conjunctiva/sclera: Conjunctivae normal.     Pupils: Pupils are equal, round, and reactive to light.  Cardiovascular:     Rate and Rhythm: Normal rate and regular rhythm.     Heart sounds: No murmur heard. Pulmonary:     Effort: Pulmonary effort is normal. No respiratory distress.     Breath sounds: Rhonchi present. No wheezing or rales.  Chest:     Chest wall: No tenderness.  Abdominal:     General: Abdomen is flat.     Palpations: Abdomen is soft.     Tenderness: There is abdominal tenderness. There is no right CVA tenderness, left CVA tenderness, guarding or rebound.  Musculoskeletal:        General: No swelling or tenderness.     Cervical back: Neck supple. No tenderness.  Right lower leg: No edema.     Left lower leg: No edema.  Skin:    General: Skin is warm and dry.     Capillary Refill: Capillary refill takes less than 2 seconds.     Findings: No erythema.  Neurological:     General: No focal deficit present.     Mental Status: She is alert.     Sensory: No sensory deficit.     Motor: No weakness.  Psychiatric:        Mood and Affect: Mood normal.     ED Results / Procedures / Treatments   Labs (all labs ordered are listed, but only abnormal results are displayed) Labs Reviewed  CBC WITH DIFFERENTIAL/PLATELET - Abnormal; Notable for the following components:      Result Value   WBC 14.4 (*)    Neutro Abs 11.5 (*)    Monocytes Absolute 1.5 (*)    Abs Immature Granulocytes 0.12 (*)    All other components within normal limits  COMPREHENSIVE METABOLIC PANEL - Abnormal; Notable for the following components:   Sodium 130 (*)    Chloride 94 (*)    Glucose, Bld 110 (*)    BUN 66 (*)    Creatinine, Ser 1.60 (*)    Calcium  8.6 (*)    Total Protein 6.2 (*)    Albumin 3.2 (*)    GFR, Estimated 33 (*)    All other components within normal limits  TROPONIN I (HIGH SENSITIVITY) - Abnormal; Notable for the following components:   Troponin I (High  Sensitivity) 38 (*)    All other components within normal limits  RESP PANEL BY RT-PCR (RSV, FLU A&B, COVID)  RVPGX2  C DIFFICILE QUICK SCREEN W PCR REFLEX    LIPASE, BLOOD  URINALYSIS, W/ REFLEX TO CULTURE (INFECTION SUSPECTED)  I-STAT CG4 LACTIC ACID, ED  I-STAT CG4 LACTIC ACID, ED  TROPONIN I (HIGH SENSITIVITY)    EKG EKG Interpretation Date/Time:  Sunday March 11 2023 14:44:40 EST Ventricular Rate:  96 PR Interval:  158 QRS Duration:  112 QT Interval:  380 QTC Calculation: 481 R Axis:   -36  Text Interpretation: Sinus rhythm Probable left atrial enlargement Abnormal R-wave progression, early transition LVH with IVCD, LAD and secondary repol abnrm when compared to prior, similar to prior No STEMI Confirmed by Ginger Barefoot ([id number]) on 03/11/2023 3:19:29 PM  Radiology CT ABDOMEN PELVIS W CONTRAST Result Date: 03/11/2023 CLINICAL DATA:  Abdominal pain, acute, nonlocalized Nausea, diarrhea, abdominal pain. Recent pneumonia with antibiotics. EXAM: CT ABDOMEN AND PELVIS WITH CONTRAST TECHNIQUE: Multidetector CT imaging of the abdomen and pelvis was performed using the standard protocol following bolus administration of intravenous contrast. RADIATION DOSE REDUCTION: This exam was performed according to the departmental dose-optimization program which includes automated exposure control, adjustment of the mA and/or kV according to patient size and/or use of iterative reconstruction technique. CONTRAST:  75mL OMNIPAQUE  IOHEXOL  300 MG/ML  SOLN COMPARISON:  Chest x-ray 03/11/2023, CT abdomen pelvis 08/13/2013, CT chest 08/05/2021 FINDINGS: Lower chest: Interval development of scattered peribronchovascular patchy airspace opacities. Bronchial wall thickening. Aortic valve calcification. Coronary artery calcification. Hepatobiliary: No focal liver abnormality. Status post cholecystectomy. No biliary dilatation. Pancreas: No focal lesion. Normal pancreatic contour. No surrounding inflammatory  changes. No main pancreatic ductal dilatation. Spleen: Normal in size without focal abnormality. Adrenals/Urinary Tract: No adrenal nodule bilaterally. Bilateral kidneys enhance symmetrically. No hydronephrosis. No hydroureter. The urinary bladder is unremarkable. On delayed imaging, there is no urothelial wall thickening and  there are no filling defects in the opacified portions of the bilateral collecting systems or ureters. Stomach/Bowel: Stomach is within normal limits. No evidence of bowel wall thickening or dilatation. Status post appendectomy. Vascular/Lymphatic: No abdominal aorta or iliac aneurysm. Severe atherosclerotic plaque of the aorta and its branches. No abdominal, pelvic, or inguinal lymphadenopathy. Reproductive: Status post hysterectomy. No adnexal masses. Other: No intraperitoneal free fluid. No intraperitoneal free gas. No organized fluid collection. Musculoskeletal: Left lumbar hernia containing fat with an abdominal defect of 3 cm (2:35). No suspicious lytic or blastic osseous lesions. No acute displaced fracture. Multilevel severe degenerative changes of the spine. L3-L5 interbody and posterolateral surgical hardware fusion. IMPRESSION: 1. Interval development of scattered peribronchovascular patchy airspace opacities. Findings suggestive of infection/inflammation. 2. Left lumbar hernia containing fat with an abdominal defect of 3 cm. 3. No acute intra-abdominal or intrapelvic abnormality. 4.  Aortic Atherosclerosis (ICD10-I70.0). Electronically Signed   By: Morgane  Naveau M.D.   On: 03/11/2023 17:42   DG Chest Portable 1 View Result Date: 03/11/2023 CLINICAL DATA:  Cough, hypoxia, diarrhea for 3-4 days EXAM: PORTABLE CHEST 1 VIEW COMPARISON:  03/05/2023, 08/05/2021 FINDINGS: Single frontal view of the chest demonstrates a stable cardiac silhouette. Chronic bibasilar scarring, without acute airspace disease, effusion, or pneumothorax. No acute bony abnormalities. Right shoulder  arthroplasty. IMPRESSION: 1. Chronic scarring.  No acute process. Electronically Signed   By: Ozell Daring M.D.   On: 03/11/2023 16:03    Procedures Procedures    Medications Ordered in ED Medications  azithromycin  (ZITHROMAX ) 500 mg in sodium chloride  0.9 % 250 mL IVPB (500 mg Intravenous New Bag/Given 03/11/23 1848)  sodium chloride  0.9 % bolus 1,000 mL (0 mLs Intravenous Stopped 03/11/23 1700)  ondansetron  (ZOFRAN ) injection 4 mg (4 mg Intravenous Given 03/11/23 1551)  fentaNYL  (SUBLIMAZE ) injection 50 mcg (50 mcg Intravenous Given 03/11/23 1552)  LORazepam  (ATIVAN ) injection 0.5 mg (0.5 mg Intravenous Given 03/11/23 1704)  iohexol  (OMNIPAQUE ) 300 MG/ML solution 75 mL (75 mLs Intravenous Contrast Given 03/11/23 1718)  cefTRIAXone  (ROCEPHIN ) 1 g in sodium chloride  0.9 % 100 mL IVPB (0 g Intravenous Stopped 03/11/23 1852)    ED Course/ Medical Decision Making/ A&P                                 Medical Decision Making Amount and/or Complexity of Data Reviewed Labs: ordered. Radiology: ordered.  Risk Prescription drug management. Decision regarding hospitalization.    Gabrielle [name] is a 78 y.o. female with a past medical history significant for previous cholecystectomy, previous appendectomy, hypertension, dyslipidemia, CAD, diabetes, and recent pneumonia who presents with continued cough, shortness of breath, abdominal pain, nausea, decreased oral intake, diarrhea, malaise, and fatigue.  According to patient, she was diagnosed with pneumonia few weeks ago and was still on antibiotics but has not been able to care for the last few days with nausea and diarrhea.  She reports moderate to severe abdominal pain all over her abdomen is 9 out of 10 severity now.  She reports no back or flank pain.  Denies significant headache or neck pain or neck stiffness.  She reports no chest pain but is having some shortness of breath and is still having a cough that is worsened over the last few days.  She  denies any constipation but is had the diarrhea.  Denies any blood in her stools.  Reports some dysuria.  Denies trauma.  Reports feeling very fatigued  and drained and has no energy.  On exam, lungs had some coarseness.  Chest nontender.  Abdomen was diffusely tender and she did have bowel sounds.  Back and flanks nontender.  Dry mucous membranes.  No significant edema in the legs.  Patient otherwise resting.  On my evaluation oxygen saturations went into the 80s on 2 L.  She does not take oxygen at home.  She is now on 4 L to keep her oxygen saturations in the 90s.  Clinically I am somewhat concerned she could still have pneumonia that worsened as she has not felt take her antibiotics due to the nausea and difficulty with oral intake over the last few days.  With her recent antibiotic use and this new diarrhea C. difficile is also considered.  With the 9 of 10 abdominal pain with tenderness with stool and nausea changes, will get CT scan to rule out abnormality.  Will get chest x-ray to rule out further pneumonia.  Will check her for COVID/flu/RSV.  Will give her some fluids as she appears dehydrated and will give her nausea medicine and pain medicine.  Will check other labs.  Given her new hypoxia, anticipate she will likely admission after workup is completed.     6:04 PM Oxygen saturations still in the 80s on 2 L, but back up to 3 L nasal cannula.  CT scan shows evidence of pneumonia but no bowel obstruction or other intra-abdominal pathology.  Still has not made a stool sample for us  to rule out C. difficile but due to the new oxygen requirement and pneumonia and AKI on labs, will call for admission.  Patient had antibiotics ordered and will admit.  She is negative for COVID/flu/RSV.         Final Clinical Impression(s) / ED Diagnoses Final diagnoses:  Nausea  Dehydration  Pneumonia due to infectious organism, unspecified laterality, unspecified part of lung  Diarrhea, unspecified type   AKI (acute kidney injury) (HCC)  Abdominal pain, unspecified abdominal location    Clinical Impression: 1. Nausea   2. Dehydration   3. Pneumonia due to infectious organism, unspecified laterality, unspecified part of lung   4. Diarrhea, unspecified type   5. AKI (acute kidney injury) (HCC)   6. Abdominal pain, unspecified abdominal location     Disposition: Admit  This note was prepared with assistance of Dragon voice recognition software. Occasional wrong-word or sound-a-like substitutions may have occurred due to the inherent limitations of voice recognition software.      Geneve Kimpel, Lonni PARAS, MD 03/11/23 [phone]

## 2023-03-11 NOTE — H&P (Signed)
 History and Physical    Patient: Gabrielle [name] FMW:[id number] DOB: 1946/01/07 DOA: 03/11/2023 DOS: the patient was seen and examined on 03/11/2023 PCP: Okey Carlin Redbird, MD  Patient coming from: Home  Chief Complaint:  Chief Complaint  Patient presents with   Diarrhea   Weakness   HPI: Gabrielle [name] is a 78 y.o. female with medical history significant of coronary artery disease, type 2 diabetes, chronic kidney disease stage III, GERD, hypothyroidism, peripheral vascular disease, who was diagnosed with influenza a last week and was initiate Tamiflu  today.  Not sure if patient had pneumonia as well during the time.  Patient came in today with worsening shortness of breath and new oxygen requirement.  No fever or chills.  Did not meet sepsis criteria.  Patient reported diarrhea over the last 3 to 4 days.  So far no diarrhea in the ER.  She was unable to keep the antibiotic that was prescribed for her in the outpatient setting apparently.  Patient seen in the ER and evaluated appears to have community-acquired pneumonia probably secondary bacterial pneumonia post viral infection.  She has been admitted to the hospital for further evaluation and treatment.  Review of Systems: As mentioned in the history of present illness. All other systems reviewed and are negative. Past Medical History:  Diagnosis Date   Anemia    Arthritis 01/17/2011   degenerative disc disease-all joints   Bursitis    CAD (coronary artery disease)    Cholecystitis with cholelithiasis 03/22/2018   Chronic kidney disease, stage 3a (HCC)    GERD (gastroesophageal reflux disease) 01/17/2011   tx. omeprazole   Heart murmur    slight murmur   Hyperlipidemia    Hypertension 01/17/2011   tx. meds   Hypothyroidism    Leg pain    ABIs 2/18: normal bilaterally   Neuropathy    FEET   Osteoarthritis    Peripheral neuropathy    Pre-diabetes    Pulmonary nodule    Seasonal allergies    Sinus problem 01/17/2011   tx.  Claritin  D   ST elevation (STEMI) myocardial infarction involving left circumflex coronary artery (HCC) 08/21/2015   Past Surgical History:  Procedure Laterality Date   ABDOMINAL HYSTERECTOMY  01-17-11   '93-Hysterectomy-heavy bleeding   ANTERIOR LATERAL LUMBAR FUSION WITH PERCUTANEOUS SCREW 1 LEVEL N/A 03/07/2017   Procedure: XLIF L3-4;  Surgeon: Burnetta Aures, MD;  Location: [facility] OR;  Service: Orthopedics;  Laterality: N/A;  4 hrs   APPENDECTOMY     BACK SURGERY  01-17-11   04-20-10-T-Lift lumbar fusion   [facility] PROCEDURE  01-17-11   Bladder sling   CARDIAC CATHETERIZATION N/A 08/21/2015   Procedure: Left Heart Cath and Coronary Angiography;  Surgeon: Ozell Fell, MD;  Location: [facility] INVASIVE CV LAB;  Service: Cardiovascular;  Laterality: N/A;   CARDIAC CATHETERIZATION N/A 08/21/2015   Procedure: Coronary Stent Intervention;  Surgeon: Ozell Fell, MD;  Location: [facility] INVAS[facility] CV LAB;  Service: Cardiovascular;  Laterality: N/A;   CARDIAC CATHETERIZATION N/A 08/23/2015   Procedure: Coronary Stent Intervention;  Surgeon: Alm LELON Clay, MD;  Location: [facility] INVAS[facility] CV LAB;  Service: Cardiovascular;  Laterality: N/A;  promus 2.5x16 in RCA   CARDIAC CATHETERIZATION N/A 08/23/2015   Procedure: Left Heart Cath and Coronary Angiography;  Surgeon: Alm LELON Clay, MD;  Location: [facility] INVASIVE CV LAB;  Service: Cardiovascular;  Laterality: N/A;   CARPAL TUNNEL RELEASE  01-17-11   '02-left, also right   CHOLECYSTECTOMY N/A 03/22/2018   Procedure: LAPAROSCOPIC  CHOLECYSTECTOMY;  Surgeon: Gail Favorite, MD;  Location: [facility] OR;  Service: General;  Laterality: N/A;   COLONOSCOPY     HARDWARE REMOVAL Right 05/15/2013   Procedure: REMOVAL RIGHT L4-L5 PEDICLE SCREWS AND ROD ;  Surgeon: Donaciano Sprang, MD;  Location: [facility] OR;  Service: Orthopedics;  Laterality: Right;   JOINT REPLACEMENT  01-17-11    RTKA' 02   LAPAROSCOPIC APPENDECTOMY N/A 08/13/2013   Procedure: APPENDECTOMY LAPAROSCOPIC;  Surgeon: Donnice KATHEE Lunger,  MD;  Location: [facility] ORS;  Service: General;  Laterality: N/A;   TONSILLECTOMY  01-17-11   child   TOTAL KNEE ARTHROPLASTY  01/19/2011   Procedure: TOTAL KNEE ARTHROPLASTY;  Surgeon: Reyes JAYSON Billing;  Location: [facility] ORS;  Service: Orthopedics;  Laterality: Left;   TOTAL SHOULDER ARTHROPLASTY Right 06/05/2019   Procedure: TOTAL SHOULDER ARTHROPLASTY;  Surgeon: Melita Drivers, MD;  Location: [facility] ORS;  Service: Orthopedics;  Laterality: Right;    Social History:  reports that she quit smoking about 29 years ago. Her smoking use included cigarettes. She started smoking about 60 years ago. She has a 30.5 pack-year smoking history. She has never used smokeless tobacco. She reports current alcohol use of about 2.0 - 3.0 standard drinks of alcohol per week. She reports that she does not use drugs.  Allergies  Allergen Reactions   Pravastatin Other (See Comments)    Effects liver function   Nsaids     Other reaction(s): intol   Penicillins Other (See Comments)    Allergy as an infant - no other information available Has patient had a PCN reaction causing immediate rash, facial/tongue/throat swelling, SOB or lightheadedness with hypotension: Unknown Has patient had a PCN reaction causing severe rash involving mucus membranes or skin necrosis: Unknown Has patient had a PCN reaction that required hospitalization: No Has patient had a PCN reaction occurring within the last 10 years: No If all of the above answers are NO, then may proceed with Cephalospor   Pravastatin Sodium     Other reaction(s): elevated LFT's    Family History  Problem Relation Age of Onset   Aneurysm Mother    Heart disease Father    Diabetes Sister    Hypertension Sister    Hyperlipidemia Sister    Heart disease Brother    Hypertension Brother    Diabetes Brother    Hyperlipidemia Brother    Heart attack Brother    Neuropathy Neg Hx    Urticaria Neg Hx    Allergic rhinitis Neg Hx    Asthma Neg Hx     Prior to  Admission medications   Medication Sig Start Date End Date Taking? Authorizing Provider  acetaminophen  (TYLENOL ) 650 MG CR tablet Take 1,950 mg by mouth daily.    [provider]  Alpha-Lipoic Acid 600 MG CAPS Take 1 capsule (600 mg total) by mouth 2 (two) times daily. 05/15/22   Ines Onetha KATHEE, MD  aspirin  EC 81 MG tablet Take 81 mg by mouth at bedtime.     [provider]  benazepril  (LOTENSIN ) 20 MG tablet Take 1 tablet (20 mg total) by mouth 2 (two) times daily. 11/13/18   Lelon Hamilton T, PA-C  Calcium  Carbonate-Vitamin D (CALCIUM  600 + D PO) Take 1 tablet by mouth 2 (two) times daily.    [provider]  Cholecalciferol (VITAMIN D) 2000 units CAPS Take 2,000 Units by mouth daily.    [provider]  Cyanocobalamin  (VITAMIN B-12 PO) Take 5,000 Units by mouth every other day.  02/07/21   [provider]  docusate sodium  (COLACE) 100 MG capsule Take 100 mg by mouth daily.    [provider]  gabapentin  (NEURONTIN ) 600 MG tablet Take 600 mg by mouth 2 (two) times daily.    [provider]  Glucosamine-Chondroit-Vit C-Mn (GLUCOSAMINE CHONDR 1500 COMPLX PO) Take 2 tablets by mouth daily. 1500/1200    [provider]  icosapent  Ethyl (VASCEPA ) 1 g capsule Take 2 capsules (2 g total) by mouth 2 (two) times daily. 09/20/22   Wonda Sharper, MD  levothyroxine  (SYNTHROID , LEVOTHROID) 75 MCG tablet Take 75 mcg by mouth daily before breakfast.    [provider]  loratadine -pseudoephedrine  (CLARITIN -D 24-HOUR) 10-240 MG per 24 hr tablet Take 1 tablet by mouth every evening.    [provider]  meclizine  (ANTIVERT ) 25 MG tablet Take 25 mg by mouth 3 (three) times daily as needed for dizziness.    [provider]  Melatonin 10 MG TABS Take 10 mg by mouth at bedtime.     [provider]  metoprolol  tartrate (LOPRESSOR ) 25 MG tablet Take 0.5 tablets (12.5 mg total) by mouth 2 (two) times daily. 06/13/19    Lelon Glendia DASEN, PA-C  Misc Natural Products (GLUCOSAMINE CHONDROITIN ADV) TABS     [provider]  NEXLIZET  180-10 MG TABS TAKE 1 TABLET BY MOUTH EVERY DAY 10/03/22   Wonda Sharper, MD  nitroGLYCERIN  (NITROSTAT ) 0.4 MG SL tablet Place 0.4 mg under the tongue every 5 (five) minutes as needed for chest pain.    [provider]  NON FORMULARY Place 1 drop under the tongue daily. CBD Oil    [provider]  omeprazole (PRILOSEC) 20 MG capsule Take 20 mg by mouth daily with breakfast.    [provider]  oseltamivir  (TAMIFLU ) 75 MG capsule Take 1 capsule (75 mg total) by mouth 2 (two) times daily for 5 days. 03/05/23 03/11/23    polyethylene glycol (MIRALAX  / GLYCOLAX ) packet Take 17 g by mouth daily as needed for mild constipation.     [provider]  rosuvastatin  (CRESTOR ) 40 MG tablet TAKE 1 TABLET BY MOUTH EVERYDAY AT BEDTIME 10/03/22   Wonda Sharper, MD  RSV vaccine recomb adjuvanted (AREXVY ) 120 MCG/0.5ML injection Inject into the muscle. 10/03/22   Luiz Channel, MD  tirzepatide  (MOUNJARO ) 15 MG/0.5ML Pen Inject 15 mg into the skin once a week. 01/17/23   Wonda Sharper, MD  triamterene -hydrochlorothiazide  (MAXZIDE -25) 37.5-25 MG tablet 1 tablet in the morning 06/28/19   [provider]    Physical Exam: Vitals:   03/11/23 1447 03/11/23 1600 03/11/23 1630 03/11/23 1759  BP:  133/68 133/64   Pulse:  85    Resp:  19 (!) 29   Temp:    98.2 F (36.8 C)  TempSrc:    Oral  SpO2:  100%    Weight: 68.5 kg     Height: 4' 11.5 (1.511 m)      Constitutional: NAD, calm, comfortable Eyes: PERRL, lids and conjunctivae normal ENMT: Mucous membranes are moist. Posterior pharynx clear of any exudate or lesions.Normal dentition.  Neck: normal, supple, no masses, no thyromegaly Respiratory: Decreased air entry bilaterally with coarse breath sounds,, no wheezing, no crackles. Normal respiratory effort. No accessory muscle use.  Cardiovascular:  Regular rate and rhythm, no murmurs / rubs / gallops. No extremity edema. 2+ pedal pulses. No carotid bruits.  Abdomen: no tenderness, no masses palpated. No hepatosplenomegaly. Bowel sounds positive.  Musculoskeletal: Good range of motion, no  joint swelling or tenderness, Skin: no rashes, lesions, ulcers. No induration Neurologic: CN 2-12 grossly intact. Sensation intact, DTR normal. Strength 5/5 in all 4.  Psychiatric: Normal judgment and insight. Alert and oriented x 3. Normal mood  Data Reviewed:  Sodium 130 potassium 3.5 chloride 94 CO2 of 22.  Glucose 110.  BUN 66 and creatinine 1.60.  Lactic acid 1.5.  White count 14.4.  Acute viral screen negative for flu and influenza.  Chest x-ray showed chronic scarring no acute process.  CT abdomen pelvis showed interval development of scattered peripheral bronchial airspace opacity left lumbar hernia.  Assessment and Plan:  #1 acute hypoxic respiratory failure: Secondary to community-acquired pneumonia.  Patient has exertional dyspnea.  Will admit.  Oxygen for now.  IV Rocephin  and Zithromax .  M.  Steroids.  Patient has completed Tamiflu  today.  Will continue current treatment.  #2 community-acquired pneumonia: Again initiate Rocephin  and Zithromax .  Also blood cultures.  #3 hyponatremia: Most likely dehydration.  Continue monitoring.  #4 AKI: Prerenal.  Continue to hydrate.  #5 non-insulin -dependent diabetes type 2: Initiate sliding scale insulin .  #6 coronary artery disease: No acute decompensation.  Continue to monitor  #7 essential hypertension: Continue blood pressure monitoring and management.  #8 diabetic neuropathy: Continue home regimen  #9 hyperlipidemia: Will continue with statin    Advance Care Planning:   Code Status: Full Code   Consults: None  Family Communication: No family at bedside  Severity of Illness: The appropriate patient status for this patient is INPATIENT. Inpatient status is judged to be reasonable and  necessary in order to provide the required intensity of service to ensure the patient's safety. The patient's presenting symptoms, physical exam findings, and initial radiographic and laboratory data in the context of their chronic comorbidities is felt to place them at high risk for further clinical deterioration. Furthermore, it is not anticipated that the patient will be medically stable for discharge from the hospital within 2 midnights of admission.   * I certify that at the point of admission it is my clinical judgment that the patient will require inpatient hospital care spanning beyond 2 midnights from the point of admission due to high intensity of service, high risk for further deterioration and high frequency of surveillance required.*  AuthorBETHA SIM KNOLL, MD 03/11/2023 6:52 PM  For on call review [email or web address].

## 2023-03-12 ENCOUNTER — Inpatient Hospital Stay (HOSPITAL_COMMUNITY): Payer: Medicare Other

## 2023-03-12 DIAGNOSIS — J189 Pneumonia, unspecified organism: Secondary | ICD-10-CM | POA: Diagnosis not present

## 2023-03-12 LAB — URINALYSIS, W/ REFLEX TO CULTURE (INFECTION SUSPECTED)
Bacteria, UA: NONE SEEN
Bilirubin Urine: NEGATIVE
Glucose, UA: 50 mg/dL — AB
Hgb urine dipstick: NEGATIVE
Ketones, ur: NEGATIVE mg/dL
Nitrite: NEGATIVE
Protein, ur: NEGATIVE mg/dL
Specific Gravity, Urine: 1.038 — ABNORMAL HIGH (ref 1.005–1.030)
pH: 5 (ref 5.0–8.0)

## 2023-03-12 LAB — HIV ANTIBODY (ROUTINE TESTING W REFLEX): HIV Screen 4th Generation wRfx: NONREACTIVE

## 2023-03-12 LAB — STREP PNEUMONIAE URINARY ANTIGEN: Strep Pneumo Urinary Antigen: NEGATIVE

## 2023-03-12 LAB — CREATININE, SERUM
Creatinine, Ser: 1.35 mg/dL — ABNORMAL HIGH (ref 0.44–1.00)
GFR, Estimated: 40 mL/min — ABNORMAL LOW (ref >60)

## 2023-03-12 LAB — GLUCOSE, CAPILLARY
Glucose-Capillary: 143 mg/dL — ABNORMAL HIGH (ref 70–99)
Glucose-Capillary: 96 mg/dL (ref 70–99)

## 2023-03-12 LAB — CBC
HCT: 35.7 % — ABNORMAL LOW (ref 36.0–46.0)
Hemoglobin: 12.6 g/dL (ref 12.0–15.0)
MCH: 31.6 pg (ref 26.0–34.0)
MCHC: 35.3 g/dL (ref 30.0–36.0)
MCV: 89.5 fL (ref 80.0–100.0)
Platelets: 257 10*3/uL (ref 150–400)
RBC: 3.99 MIL/uL (ref 3.87–5.11)
RDW: 13.2 % (ref 11.5–15.5)
WBC: 9 10*3/uL (ref 4.0–10.5)
nRBC: 0 % (ref 0.0–0.2)

## 2023-03-12 LAB — BRAIN NATRIURETIC PEPTIDE: B Natriuretic Peptide: 152.4 pg/mL — ABNORMAL HIGH (ref 0.0–100.0)

## 2023-03-12 LAB — CBG MONITORING, ED
Glucose-Capillary: 95 mg/dL (ref 70–99)
Glucose-Capillary: 161 mg/dL — ABNORMAL HIGH (ref 70–99)

## 2023-03-12 LAB — HEMOGLOBIN A1C
Hgb A1c MFr Bld: 5.6 % (ref 4.8–5.6)
Mean Plasma Glucose: 114.02 mg/dL

## 2023-03-12 LAB — MRSA NEXT GEN BY PCR, NASAL: MRSA by PCR Next Gen: NOT DETECTED

## 2023-03-12 MED ORDER — IOHEXOL 350 MG/ML SOLN
60.0000 mL | Freq: Once | INTRAVENOUS | Status: AC | PRN
  Administered 2023-03-12: 60 mL via INTRAVENOUS

## 2023-03-12 MED ORDER — METOPROLOL TARTRATE 12.5 MG HALF TABLET
12.5000 mg | ORAL_TABLET | Freq: Two times a day (BID) | ORAL | Status: DC
  Administered 2023-03-12 – 2023-03-15 (×6): 12.5 mg via ORAL
  Filled 2023-03-12 (×6): qty 1

## 2023-03-12 MED ORDER — GABAPENTIN 300 MG PO CAPS
600.0000 mg | ORAL_CAPSULE | Freq: Two times a day (BID) | ORAL | Status: DC
  Administered 2023-03-12 – 2023-03-15 (×6): 600 mg via ORAL
  Filled 2023-03-12 (×6): qty 2

## 2023-03-12 MED ORDER — METHYLPREDNISOLONE SODIUM SUCC 40 MG IJ SOLR
40.0000 mg | Freq: Two times a day (BID) | INTRAMUSCULAR | Status: AC
  Administered 2023-03-12 – 2023-03-13 (×4): 40 mg via INTRAVENOUS
  Filled 2023-03-12 (×4): qty 1

## 2023-03-12 MED ORDER — INSULIN ASPART 100 UNIT/ML IJ SOLN
0.0000 [IU] | Freq: Three times a day (TID) | INTRAMUSCULAR | Status: DC
  Administered 2023-03-12: 2 [IU] via SUBCUTANEOUS
  Administered 2023-03-12 – 2023-03-14 (×3): 3 [IU] via SUBCUTANEOUS
  Administered 2023-03-15: 2 [IU] via SUBCUTANEOUS
  Filled 2023-03-12: qty 0.15

## 2023-03-12 MED ORDER — SODIUM CHLORIDE 0.9 % IV SOLN
2.0000 g | INTRAVENOUS | Status: DC
  Administered 2023-03-12 – 2023-03-15 (×4): 2 g via INTRAVENOUS
  Filled 2023-03-12 (×4): qty 20

## 2023-03-12 MED ORDER — SODIUM CHLORIDE 0.9 % IV SOLN: 500.0000 mg | INTRAVENOUS | Status: DC

## 2023-03-12 MED ORDER — LEVOTHYROXINE SODIUM 75 MCG PO TABS
75.0000 ug | ORAL_TABLET | Freq: Every day | ORAL | Status: DC
Start: 2023-03-13 — End: 2023-03-15
  Administered 2023-03-13 – 2023-03-15 (×3): 75 ug via ORAL
  Filled 2023-03-12 (×3): qty 1

## 2023-03-12 MED ORDER — SODIUM CHLORIDE 0.9 % IV SOLN: INTRAVENOUS | Status: DC

## 2023-03-12 MED ORDER — AZITHROMYCIN 250 MG PO TABS
500.0000 mg | ORAL_TABLET | Freq: Every day | ORAL | Status: AC
  Administered 2023-03-12 – 2023-03-15 (×4): 500 mg via ORAL
  Filled 2023-03-12 (×5): qty 2

## 2023-03-12 MED ORDER — INSULIN ASPART 100 UNIT/ML IJ SOLN
0.0000 [IU] | Freq: Every day | INTRAMUSCULAR | Status: DC
  Filled 2023-03-12: qty 0.05

## 2023-03-12 MED ORDER — ENOXAPARIN SODIUM 30 MG/0.3ML IJ SOSY
30.0000 mg | PREFILLED_SYRINGE | INTRAMUSCULAR | Status: DC
  Administered 2023-03-12 – 2023-03-14 (×3): 30 mg via SUBCUTANEOUS
  Filled 2023-03-12 (×3): qty 0.3

## 2023-03-12 MED ORDER — PANTOPRAZOLE SODIUM 40 MG PO TBEC
40.0000 mg | DELAYED_RELEASE_TABLET | Freq: Every day | ORAL | Status: DC
  Administered 2023-03-13 – 2023-03-15 (×3): 40 mg via ORAL
  Filled 2023-03-12 (×3): qty 1

## 2023-03-12 NOTE — Progress Notes (Signed)
 PROGRESS NOTE    Gabrielle [name]  ZHY:[id number] DOB: 1945/07/13 DOA: 03/11/2023 PCP: Jimmey Mould, MD  Chief Complaint  Patient presents with   Diarrhea   Weakness    Brief Narrative:   Gabrielle [name] is Gabrielle [name] 78 y.o. female with medical history significant of coronary artery disease, type 2 diabetes, chronic kidney disease stage III, GERD, hypothyroidism, peripheral vascular disease, who was diagnosed with influenza Gabrielle [name] last week.  She completed Gabrielle [name] course of tamiflu .  Admitted with post influenza pneumonia.   Assessment & Plan:   Principal Problem:   CAP (community acquired pneumonia) Active Problems:   Neuropathy   Dyslipidemia   Hypertension   Coronary artery disease involving native coronary artery of native heart without angina pectoris   Type 2 diabetes mellitus without complication, without long-term current use of insulin  (HCC)   Obesity, Class II, BMI 35-39.9  Community Acquired Pneumonia CT abd/pelvis with interval development of scattered peribronchoavscular patchy airspace opacities concerning for infection/inflammation Will follow CT chest with contrast today Ceftriaxone /azithromycin  Continue on steroids  Negative flu, RSV, covid Negative MRSA PCR, pending urine strep, pending urine legionella Recently completed tamiflu  Currently on RA  Chest Pain  Elevated Troponin CAD Suspect due to above, though not pleuritic - ddx includes GI causes as epigastric in location Follow CT chest above Echo  Troponins not in pattern c/w ACS Repeat as indicated  T2DM  Peripheral Neuropathy SSI Hold mounjaro   Hypertension Hold ACE Hold diuretics Continue metoprolol   Hypothyroidism Synthroid    AKI Improving  Dyslipidemia statin  GERD PPI    DVT prophylaxis: lovenox  Code Status: full Family Communication: none Disposition:   Status is: Inpatient Remains inpatient appropriate because: need for continued inpatient care   Consultants:   none  Procedures:  none  Antimicrobials:  Anti-infectives (From admission, onward)    Start     Dose/Rate Route Frequency Ordered Stop   03/12/23 1800  azithromycin  (ZITHROMAX ) tablet 500 mg        500 mg Oral Daily-1800 03/12/23 0944 03/16/23 1759   03/12/23 0458  cefTRIAXone  (ROCEPHIN ) 2 g in sodium chloride  0.9 % 100 mL IVPB        2 g 200 mL/hr over 30 Minutes Intravenous Every 24 hours 03/12/23 0458 03/17/23 0759   03/12/23 0458  azithromycin  (ZITHROMAX ) 500 mg in sodium chloride  0.9 % 250 mL IVPB  Status:  Discontinued        500 mg 250 mL/hr over 60 Minutes Intravenous Every 24 hours 03/12/23 0458 03/12/23 0944   03/11/23 1815  cefTRIAXone  (ROCEPHIN ) 1 g in sodium chloride  0.9 % 100 mL IVPB        1 g 200 mL/hr over 30 Minutes Intravenous  Once 03/11/23 1804 03/11/23 1852   03/11/23 1815  azithromycin  (ZITHROMAX ) 500 mg in sodium chloride  0.9 % 250 mL IVPB        500 mg 250 mL/hr over 60 Minutes Intravenous  Once 03/11/23 1804 03/11/23 1952       Subjective: Feels better today Has some CP, not pleuritic  Objective: Vitals:   03/12/23 0947 03/12/23 1230 03/12/23 1300 03/12/23 1406  BP:  117/62 (!) 153/73   Pulse:  88 79   Resp:  19    Temp: 98 F (36.7 C)   98.2 F (36.8 C)  TempSrc: Oral   Oral  SpO2:  97% 96%   Weight:      Height:       No intake or output data in the  24 hours ending 03/12/23 1418 Filed Weights   03/11/23 1447  Weight: 68.5 kg    Examination:  General exam: Appears calm and comfortable  Respiratory system:diminished, no increased wob Cardiovascular system: RRR Gastrointestinal system: Abdomen is nondistended, soft and nontender.  Central nervous system: Alert and oriented. No focal neurological deficits. Extremities: no LEE   Data Reviewed: I have personally reviewed following labs and imaging studies  CBC: Recent Labs  Lab 03/11/23 1544 03/12/23 0530  WBC 14.4* 9.0  NEUTROABS 11.5*  --   HGB 14.1 12.6  HCT 40.3 35.7*   MCV 88.6 89.5  PLT 298 257    Basic Metabolic Panel: Recent Labs  Lab 03/11/23 1544 03/12/23 0530  NA 130*  --   K 3.5  --   CL 94*  --   CO2 22  --   GLUCOSE 110*  --   BUN 66*  --   CREATININE 1.60* 1.35*  CALCIUM  8.6*  --     GFR: Estimated Creatinine Clearance: 29.8 mL/min ([name]) (by C-G formula based on SCr of 1.35 mg/dL (H)).  Liver Function Tests: Recent Labs  Lab 03/11/23 1544  AST 34  ALT 32  ALKPHOS 73  BILITOT 0.9  PROT 6.2*  ALBUMIN 3.2*    CBG: Recent Labs  Lab 03/12/23 0840 03/12/23 1240  GLUCAP 95 161*     Recent Results (from the past 240 hours)  Resp panel by RT-PCR (RSV, Flu [name], Covid) Anterior Nasal Swab     Status: None   Collection Time: 03/11/23  3:49 PM   Specimen: Anterior Nasal Swab  Result Value Ref Range Status   SARS Coronavirus 2 by RT PCR NEGATIVE NEGATIVE Final    Comment: (NOTE) SARS-CoV-2 target nucleic acids are NOT DETECTED.  The SARS-CoV-2 RNA is generally detectable in upper respiratory specimens during the acute phase of infection. The lowest concentration of SARS-CoV-2 viral copies this assay can detect is 138 copies/mL. [name] negative result does not preclude SARS-Cov-2 infection and should not be used as the sole basis for treatment or other patient management decisions. [name] negative result may occur with  improper specimen collection/handling, submission of specimen other than nasopharyngeal swab, presence of viral mutation(s) within the areas targeted by this assay, and inadequate number of viral copies(<138 copies/mL). [name] negative result must be combined with clinical observations, patient history, and epidemiological information. The expected result is Negative.  Fact Sheet for Patients:  BloggerCourse.com  Fact Sheet for Healthcare Providers:  SeriousBroker.it  This test is no t yet approved or cleared by the [place]  FDA and  has been authorized for  detection and/or diagnosis of SARS-CoV-2 by FDA under an Emergency Use Authorization (EUA). This EUA will remain  in effect (meaning this test can be used) for the duration of the COVID-19 declaration under Section 564(b)(1) of the Act, 21 U.S.C.section 360bbb-3(b)(1), unless the authorization is terminated  or revoked sooner.       Influenza Amori Cooperman by PCR NEGATIVE NEGATIVE Final   Influenza B by PCR NEGATIVE NEGATIVE Final    Comment: (NOTE) The Xpert Xpress SARS-CoV-2/FLU/RSV plus assay is intended as an aid in the diagnosis of influenza from Nasopharyngeal swab specimens and should not be used as Ranelle Auker sole basis for treatment. Nasal washings and aspirates are unacceptable for Xpert Xpress SARS-CoV-2/FLU/RSV testing.  Fact Sheet for Patients: BloggerCourse.com  Fact Sheet for Healthcare Providers: SeriousBroker.it  This test is not yet approved or cleared by the [place]  FDA and has been authorized  for detection and/or diagnosis of SARS-CoV-2 by FDA under an Emergency Use Authorization (EUA). This EUA will remain in effect (meaning this test can be used) for the duration of the COVID-19 declaration under Section 564(b)(1) of the Act, 21 U.S.C. section 360bbb-3(b)(1), unless the authorization is terminated or revoked.     Resp Syncytial Virus by PCR NEGATIVE NEGATIVE Final    Comment: (NOTE) Fact Sheet for Patients: BloggerCourse.com  Fact Sheet for Healthcare Providers: SeriousBroker.it  This test is not yet approved or cleared by the [place]  FDA and has been authorized for detection and/or diagnosis of SARS-CoV-2 by FDA under an Emergency Use Authorization (EUA). This EUA will remain in effect (meaning this test can be used) for the duration of the COVID-19 declaration under Section 564(b)(1) of the Act, 21 U.S.C. section 360bbb-3(b)(1), unless the authorization is  terminated or revoked.  Performed at [facility], [address] [place], [place] [address]   MRSA Next Gen by PCR, Nasal     Status: None   Collection Time: 03/12/23  8:27 AM   Specimen: Nasal Mucosa; Nasal Swab  Result Value Ref Range Status   MRSA by PCR Next Gen NOT DETECTED NOT DETECTED Final    Comment: (NOTE) The GeneXpert MRSA Assay (FDA approved for NASAL specimens only), is one component of Andrina Locken comprehensive MRSA colonization surveillance program. It is not intended to diagnose MRSA infection nor to guide or monitor treatment for MRSA infections. Test performance is not FDA approved in patients less than 4 years old. Performed at [facility], [address] [place], [place] [address]          Radiology Studies: CT ABDOMEN PELVIS W CONTRAST Result Date: 03/11/2023 CLINICAL DATA:  Abdominal pain, acute, nonlocalized Nausea, diarrhea, abdominal pain. Recent pneumonia with antibiotics. EXAM: CT ABDOMEN AND PELVIS WITH CONTRAST TECHNIQUE: Multidetector CT imaging of the abdomen and pelvis was performed using the standard protocol following bolus administration of intravenous contrast. RADIATION DOSE REDUCTION: This exam was performed according to the departmental dose-optimization program which includes automated exposure control, adjustment of the mA and/or kV according to patient size and/or use of iterative reconstruction technique. CONTRAST:  75mL OMNIPAQUE  IOHEXOL  300 MG/ML  SOLN COMPARISON:  Chest x-ray 03/11/2023, CT abdomen pelvis 08/13/2013, CT chest 08/05/2021 FINDINGS: Lower chest: Interval development of scattered peribronchovascular patchy airspace opacities. Bronchial wall thickening. Aortic valve calcification. Coronary artery calcification. Hepatobiliary: No focal liver abnormality. Status post cholecystectomy. No biliary dilatation. Pancreas: No focal lesion. Normal pancreatic contour. No surrounding inflammatory changes. No  main pancreatic ductal dilatation. Spleen: Normal in size without focal abnormality. Adrenals/Urinary Tract: No adrenal nodule bilaterally. Bilateral kidneys enhance symmetrically. No hydronephrosis. No hydroureter. The urinary bladder is unremarkable. On delayed imaging, there is no urothelial wall thickening and there are no filling defects in the opacified portions of the bilateral collecting systems or ureters. Stomach/Bowel: Stomach is within normal limits. No evidence of bowel wall thickening or dilatation. Status post appendectomy. Vascular/Lymphatic: No abdominal aorta or iliac aneurysm. Severe atherosclerotic plaque of the aorta and its branches. No abdominal, pelvic, or inguinal lymphadenopathy. Reproductive: Status post hysterectomy. No adnexal masses. Other: No intraperitoneal free fluid. No intraperitoneal free gas. No organized fluid collection. Musculoskeletal: Left lumbar hernia containing fat with an abdominal defect of 3 cm (2:35). No suspicious lytic or blastic osseous lesions. No acute displaced fracture. Multilevel severe degenerative changes of the spine. L3-L5 interbody and posterolateral surgical hardware fusion. IMPRESSION: 1. Interval development of scattered peribronchovascular patchy  airspace opacities. Findings suggestive of infection/inflammation. 2. Left lumbar hernia containing fat with an abdominal defect of 3 cm. 3. No acute intra-abdominal or intrapelvic abnormality. 4.  Aortic Atherosclerosis (ICD10-I70.0). Electronically Signed   By: Morgane  Naveau M.D.   On: 03/11/2023 17:42   DG Chest Portable 1 View Result Date: 03/11/2023 CLINICAL DATA:  Cough, hypoxia, diarrhea for 3-4 days EXAM: PORTABLE CHEST 1 VIEW COMPARISON:  03/05/2023, 08/05/2021 FINDINGS: Single frontal view of the chest demonstrates Maclain Cohron stable cardiac silhouette. Chronic bibasilar scarring, without acute airspace disease, effusion, or pneumothorax. No acute bony abnormalities. Right shoulder arthroplasty.  IMPRESSION: 1. Chronic scarring.  No acute process. Electronically Signed   By: Bobbye Burrow M.D.   On: 03/11/2023 16:03        Scheduled Meds:  azithromycin   500 mg Oral q1800   enoxaparin  (LOVENOX ) injection  30 mg Subcutaneous Q24H   insulin  aspart  0-15 Units Subcutaneous TID WC   insulin  aspart  0-5 Units Subcutaneous QHS   methylPREDNISolone  (SOLU-MEDROL ) injection  40 mg Intravenous Q12H   Continuous Infusions:  sodium chloride  75 mL/hr at 03/12/23 0531   cefTRIAXone  (ROCEPHIN )  IV Stopped (03/12/23 0855)     LOS: 1 day    Time spent: over 30 min    Donnetta Gains, MD [facility]   To contact the attending provider between 7A-7P or the covering provider during after hours 7P-7A, please log into the web site [email or web address] and access using universal  password for that web site. If you do not have the password, please call the hospital operator.  03/12/2023, 2:18 PM

## 2023-03-13 ENCOUNTER — Inpatient Hospital Stay (HOSPITAL_COMMUNITY): Payer: Medicare Other

## 2023-03-13 DIAGNOSIS — R079 Chest pain, unspecified: Secondary | ICD-10-CM

## 2023-03-13 DIAGNOSIS — J189 Pneumonia, unspecified organism: Secondary | ICD-10-CM | POA: Diagnosis not present

## 2023-03-13 LAB — COMPREHENSIVE METABOLIC PANEL
ALT: 24 U/L (ref 0–44)
AST: 26 U/L (ref 15–41)
Albumin: 2.9 g/dL — ABNORMAL LOW (ref 3.5–5.0)
Alkaline Phosphatase: 65 U/L (ref 38–126)
Anion gap: 9 (ref 5–15)
BUN: 35 mg/dL — ABNORMAL HIGH (ref 8–23)
CO2: 23 mmol/L (ref 22–32)
Calcium: 7.9 mg/dL — ABNORMAL LOW (ref 8.9–10.3)
Chloride: 99 mmol/L (ref 98–111)
Creatinine, Ser: 1.14 mg/dL — ABNORMAL HIGH (ref 0.44–1.00)
GFR, Estimated: 50 mL/min — ABNORMAL LOW (ref >60)
Glucose, Bld: 108 mg/dL — ABNORMAL HIGH (ref 70–99)
Potassium: 3.4 mmol/L — ABNORMAL LOW (ref 3.5–5.1)
Sodium: 131 mmol/L — ABNORMAL LOW (ref 135–145)
Total Bilirubin: 0.9 mg/dL (ref 0.0–1.2)
Total Protein: 5.9 g/dL — ABNORMAL LOW (ref 6.5–8.1)

## 2023-03-13 LAB — CBC WITH DIFFERENTIAL/PLATELET
Abs Immature Granulocytes: 0.07 10*3/uL (ref 0.00–0.07)
Basophils Absolute: 0 10*3/uL (ref 0.0–0.1)
Basophils Relative: 0 %
Eosinophils Absolute: 0 10*3/uL (ref 0.0–0.5)
Eosinophils Relative: 0 %
HCT: 36.7 % (ref 36.0–46.0)
Hemoglobin: 12.6 g/dL (ref 12.0–15.0)
Immature Granulocytes: 1 %
Lymphocytes Relative: 6 %
Lymphs Abs: 0.5 10*3/uL — ABNORMAL LOW (ref 0.7–4.0)
MCH: 31.1 pg (ref 26.0–34.0)
MCHC: 34.3 g/dL (ref 30.0–36.0)
MCV: 90.6 fL (ref 80.0–100.0)
Monocytes Absolute: 0.4 10*3/uL (ref 0.1–1.0)
Monocytes Relative: 4 %
Neutro Abs: 7.7 10*3/uL (ref 1.7–7.7)
Neutrophils Relative %: 89 %
Platelets: 259 10*3/uL (ref 150–400)
RBC: 4.05 MIL/uL (ref 3.87–5.11)
RDW: 13.3 % (ref 11.5–15.5)
WBC: 8.7 10*3/uL (ref 4.0–10.5)
nRBC: 0 % (ref 0.0–0.2)

## 2023-03-13 LAB — ECHOCARDIOGRAM COMPLETE
Area-P 1/2: 3.48 cm2
Calc EF: 53.3 %
Height: 59.5 in
S' Lateral: 2.6 cm
Single Plane A2C EF: 56.9 %
Single Plane A4C EF: 52.6 %
Weight: 2416 [oz_av]

## 2023-03-13 LAB — GLUCOSE, CAPILLARY
Glucose-Capillary: 109 mg/dL — ABNORMAL HIGH (ref 70–99)
Glucose-Capillary: 185 mg/dL — ABNORMAL HIGH (ref 70–99)
Glucose-Capillary: 122 mg/dL — ABNORMAL HIGH (ref 70–99)
Glucose-Capillary: 188 mg/dL — ABNORMAL HIGH (ref 70–99)
Glucose-Capillary: 179 mg/dL — ABNORMAL HIGH (ref 70–99)

## 2023-03-13 LAB — C DIFFICILE QUICK SCREEN W PCR REFLEX
C Diff antigen: NEGATIVE
C Diff interpretation: NOT DETECTED
C Diff toxin: NEGATIVE

## 2023-03-13 LAB — LIPASE, BLOOD: Lipase: 52 U/L — ABNORMAL HIGH (ref 11–51)

## 2023-03-13 LAB — PHOSPHORUS: Phosphorus: 2.6 mg/dL (ref 2.5–4.6)

## 2023-03-13 LAB — C-REACTIVE PROTEIN: CRP: 0.7 mg/dL (ref <1.0)

## 2023-03-13 LAB — MAGNESIUM: Magnesium: 1.2 mg/dL — ABNORMAL LOW (ref 1.7–2.4)

## 2023-03-13 LAB — PROCALCITONIN: Procalcitonin: <0.1 ng/mL

## 2023-03-13 MED ORDER — ALBUTEROL SULFATE (2.5 MG/3ML) 0.083% IN NEBU: 2.5000 mg | INHALATION_SOLUTION | RESPIRATORY_TRACT | Status: DC | PRN

## 2023-03-13 MED ORDER — HYDROCODONE-ACETAMINOPHEN 5-325 MG PO TABS
1.0000 | ORAL_TABLET | ORAL | Status: DC | PRN
  Administered 2023-03-13: 1 via ORAL
  Filled 2023-03-13: qty 1

## 2023-03-13 MED ORDER — POTASSIUM CHLORIDE CRYS ER 20 MEQ PO TBCR
40.0000 meq | EXTENDED_RELEASE_TABLET | ORAL | Status: AC
Start: 2023-03-13 — End: 2023-03-13
  Administered 2023-03-13 (×2): 40 meq via ORAL
  Filled 2023-03-13 (×2): qty 2

## 2023-03-13 MED ORDER — FUROSEMIDE 40 MG PO TABS
40.0000 mg | ORAL_TABLET | Freq: Every day | ORAL | Status: DC
  Administered 2023-03-13 – 2023-03-14 (×2): 40 mg via ORAL
  Filled 2023-03-13 (×2): qty 1

## 2023-03-13 MED ORDER — IPRATROPIUM-ALBUTEROL 0.5-2.5 (3) MG/3ML IN SOLN
3.0000 mL | Freq: Four times a day (QID) | RESPIRATORY_TRACT | Status: DC
  Administered 2023-03-13 – 2023-03-14 (×5): 3 mL via RESPIRATORY_TRACT
  Filled 2023-03-13 (×6): qty 3

## 2023-03-13 MED ORDER — PREDNISONE 20 MG PO TABS
40.0000 mg | ORAL_TABLET | Freq: Every day | ORAL | Status: DC
  Administered 2023-03-14 – 2023-03-15 (×2): 40 mg via ORAL
  Filled 2023-03-13 (×2): qty 2

## 2023-03-13 MED ORDER — MAGNESIUM SULFATE 4 GM/100ML IV SOLN
4.0000 g | Freq: Once | INTRAVENOUS | Status: AC
  Administered 2023-03-13: 4 g via INTRAVENOUS
  Filled 2023-03-13: qty 100

## 2023-03-13 NOTE — Progress Notes (Signed)
PROGRESS NOTE    Gabrielle [name]  ZOX:[id number] DOB: 29-Nov-1945 DOA: 03/11/2023 PCP: Daisy Floro, MD  Chief Complaint  Patient presents with   Diarrhea   Weakness    Brief Narrative:   Gabrielle [name] is Gabrielle [name] 78 y.o. female with medical history significant of coronary artery disease, type 2 diabetes, chronic kidney disease stage III, GERD, hypothyroidism, peripheral vascular disease, who was treated for pneumonia and influenza prior to presentation.  She completed Venessa Wickham course of tamiflu.   She was taking steroids and doxycycline prior to admission.  Admitted with post influenza pneumonia.   Assessment & Plan:   Principal Problem:   Pneumonia due to infectious organism Active Problems:   Neuropathy   Dyslipidemia   Hypertension   Coronary artery disease involving native coronary artery of native heart without angina pectoris   Type 2 diabetes mellitus without complication, without long-term current use of insulin (HCC)   Obesity, Class II, BMI 35-39.9  Community Acquired Pneumonia Post Influenza Pneumonia Patchy Ground Glass Opacities Dx with pneumonia, influenza initially on 2/3 -> followed up with [facility] on 2/6 and appeared to be improving at that time.  We're currently treating her for post influenza pneumonia, though notably her primary complaint when she came to the ED was nausea/vomiting.  CT abd/pelvis with interval development of scattered peribronchoavscular patchy airspace opacities concerning for infection/inflammation CT chest with patchy ground glass opacities throughout bilateral lungs -> broad differential -> treating for infectious cause.   repeat CT scan in about 4 weeks (after she improves).  Ceftriaxone/azithromycin Continue on steroids, will transition to PO in AM Will add lasix PO daily with goal to get net negative to net even as tolerated by her BP/renal function Negative flu, RSV, covid Follow [facility]  Negative MRSA PCR, negative urine strep, pending urine  legionella Recently completed tamiflu.  Was taking doxy prior to admission.  Currently on RA, though still notably dyspneic.  If she doesn't improve as expected, would consult pulmonology.  Diarrhea  Epigastric Pain  PPI daily Repeat lipase Negative C diff Awaiting GI pathogen panel CT abd/pelvis from 2/9 without acute intraabdominal or intrapelvic abnormality Additional workup as indicated  Chest Pain  Elevated Troponin CAD This is more accurately described as epigastric discomfort.  Suspect related to suspected pneumonia above -> she has epigastric TTP, which makes cardiac etiology unlikely.  She describes Caramia Boutin pleuritic nature to the pain as well today.  Echo  Troponins not in pattern c/w ACS Repeat as indicated  T2DM  Peripheral Neuropathy SSI Hold mounjaro  Hypertension Hold ACE Holding thiazide, trial of lasix to keep net negative to net even Continue metoprolol  Hypothyroidism Synthroid   AKI Back to baseline Watch with diuresis   Dyslipidemia statin  GERD PPI  Family Communication: husband/son would like to be involved, please reach out to them if not at bedside.    DVT prophylaxis: lovenox Code Status: full Family Communication: none Disposition:   Status is: Inpatient Remains inpatient appropriate because: need for continued inpatient care   Consultants:  none  Procedures:  none  Antimicrobials:  Anti-infectives (From admission, onward)    Start     Dose/Rate Route Frequency Ordered Stop   03/12/23 1800  azithromycin (ZITHROMAX) tablet 500 mg        500 mg Oral Daily-1800 03/12/23 0944 03/16/23 1759   03/12/23 0458  cefTRIAXone (ROCEPHIN) 2 g in sodium chloride 0.9 % 100 mL IVPB        2 g 200  mL/hr over 30 Minutes Intravenous Every 24 hours 03/12/23 0458 03/17/23 0759   03/12/23 0458  azithromycin (ZITHROMAX) 500 mg in sodium chloride 0.9 % 250 mL IVPB  Status:  Discontinued        500 mg 250 mL/hr over 60 Minutes Intravenous Every 24  hours 03/12/23 0458 03/12/23 0944   03/11/23 1815  cefTRIAXone (ROCEPHIN) 1 g in sodium chloride 0.9 % 100 mL IVPB        1 g 200 mL/hr over 30 Minutes Intravenous  Once 03/11/23 1804 03/11/23 1852   03/11/23 1815  azithromycin (ZITHROMAX) 500 mg in sodium chloride 0.9 % 250 mL IVPB        500 mg 250 mL/hr over 60 Minutes Intravenous  Once 03/11/23 1804 03/11/23 1952       Subjective: Diarrhea maybe Princetta Uplinger little better Still some burning epigastric pain  SOB maybe Cobe Viney little better Her husband and son on the phone  Objective: Vitals:   03/12/23 2331 03/13/23 0513 03/13/23 0857 03/13/23 1444  BP: (!) 156/74 (!) 138/55 (!) 138/55 (!) 158/85  Pulse: 74 78 78 77  Resp: 15 18  18   Temp: 98.1 F (36.7 C) 98.4 F (36.9 C)  98.3 F (36.8 C)  TempSrc:      SpO2: 96% 95%  95%  Weight:      Height:        Intake/Output Summary (Last 24 hours) at 03/13/2023 1501 Last data filed at 03/13/2023 1300 Gross per 24 hour  Intake 2229.73 ml  Output 900 ml  Net 1329.73 ml   Filed Weights   03/11/23 1447  Weight: 68.5 kg    Examination:  General: No acute distress. Cardiovascular: RRR Lungs: decreased air movement, crackles at the bases, appears SOB at rest Abdomen: epigastric TTP, soft, nondistended Neurological: Alert and oriented 3. Moves all extremities 4. Cranial nerves II through XII grossly intact. Skin: Warm and dry. No rashes or lesions. Extremities: No clubbing or cyanosis. No edema. Palpable peripheral pulses.  Data Reviewed: I have personally reviewed following labs and imaging studies  CBC: Recent Labs  Lab 03/11/23 1544 03/12/23 0530 03/13/23 0603  WBC 14.4* 9.0 8.7  NEUTROABS 11.5*  --  7.7  HGB 14.1 12.6 12.6  HCT 40.3 35.7* 36.7  MCV 88.6 89.5 90.6  PLT 298 257 259    Basic Metabolic Panel: Recent Labs  Lab 03/11/23 1544 03/12/23 0530 03/13/23 0603  NA 130*  --  131*  K 3.5  --  3.4*  CL 94*  --  99  CO2 22  --  23  GLUCOSE 110*  --  108*  BUN  66*  --  35*  CREATININE 1.60* 1.35* 1.14*  CALCIUM 8.6*  --  7.9*  MG  --   --  1.2*  PHOS  --   --  2.6    GFR: Estimated Creatinine Clearance: 35.2 mL/min (Seaira Byus) (by C-G formula based on SCr of 1.14 mg/dL (H)).  Liver Function Tests: Recent Labs  Lab 03/11/23 1544 03/13/23 0603  AST 34 26  ALT 32 24  ALKPHOS 73 65  BILITOT 0.9 0.9  PROT 6.2* 5.9*  ALBUMIN 3.2* 2.9*    CBG: Recent Labs  Lab 03/12/23 1240 03/12/23 1730 03/12/23 2040 03/13/23 0730 03/13/23 1159  GLUCAP 161* 143* 96 109* 185*     Recent Results (from the past 240 hours)  Resp panel by RT-PCR (RSV, Flu Kristoffer Bala&B, Covid) Anterior Nasal Swab     Status: None  Collection Time: 03/11/23  3:49 PM   Specimen: Anterior Nasal Swab  Result Value Ref Range Status   SARS Coronavirus 2 by RT PCR NEGATIVE NEGATIVE Final    Comment: (NOTE) SARS-CoV-2 target nucleic acids are NOT DETECTED.  The SARS-CoV-2 RNA is generally detectable in upper respiratory specimens during the acute phase of infection. The lowest concentration of SARS-CoV-2 viral copies this assay can detect is 138 copies/mL. Tonimarie Gritz negative result does not preclude SARS-Cov-2 infection and should not be used as the sole basis for treatment or other patient management decisions. Anselma Herbel negative result may occur with  improper specimen collection/handling, submission of specimen other than nasopharyngeal swab, presence of viral mutation(s) within the areas targeted by this assay, and inadequate number of viral copies(<138 copies/mL). Lakesha Levinson negative result must be combined with clinical observations, patient history, and epidemiological information. The expected result is Negative.  Fact Sheet for Patients:  BloggerCourse.com  Fact Sheet for Healthcare Providers:  SeriousBroker.it  This test is no t yet approved or cleared by the [place] FDA and  has been authorized for detection and/or diagnosis of  SARS-CoV-2 by FDA under an Emergency Use Authorization (EUA). This EUA will remain  in effect (meaning this test can be used) for the duration of the COVID-19 declaration under Section 564(b)(1) of the Act, 21 U.S.C.section 360bbb-3(b)(1), unless the authorization is terminated  or revoked sooner.       Influenza Tanaiya Kolarik by PCR NEGATIVE NEGATIVE Final   Influenza B by PCR NEGATIVE NEGATIVE Final    Comment: (NOTE) The Xpert Xpress SARS-CoV-2/FLU/RSV plus assay is intended as an aid in the diagnosis of influenza from Nasopharyngeal swab specimens and should not be used as Sibel Khurana sole basis for treatment. Nasal washings and aspirates are unacceptable for Xpert Xpress SARS-CoV-2/FLU/RSV testing.  Fact Sheet for Patients: BloggerCourse.com  Fact Sheet for Healthcare Providers: SeriousBroker.it  This test is not yet approved or cleared by the [place] FDA and has been authorized for detection and/or diagnosis of SARS-CoV-2 by FDA under an Emergency Use Authorization (EUA). This EUA will remain in effect (meaning this test can be used) for the duration of the COVID-19 declaration under Section 564(b)(1) of the Act, 21 U.S.C. section 360bbb-3(b)(1), unless the authorization is terminated or revoked.     Resp Syncytial Virus by PCR NEGATIVE NEGATIVE Final    Comment: (NOTE) Fact Sheet for Patients: BloggerCourse.com  Fact Sheet for Healthcare Providers: SeriousBroker.it  This test is not yet approved or cleared by the [place] FDA and has been authorized for detection and/or diagnosis of SARS-CoV-2 by FDA under an Emergency Use Authorization (EUA). This EUA will remain in effect (meaning this test can be used) for the duration of the COVID-19 declaration under Section 564(b)(1) of the Act, 21 U.S.C. section 360bbb-3(b)(1), unless the authorization is terminated  or revoked.  Performed at [facility], [address], [place], [place] [address]   MRSA Next Gen by PCR, Nasal     Status: None   Collection Time: 03/12/23  8:27 AM   Specimen: Nasal Mucosa; Nasal Swab  Result Value Ref Range Status   MRSA by PCR Next Gen NOT DETECTED NOT DETECTED Final    Comment: (NOTE) The GeneXpert MRSA Assay (FDA approved for NASAL specimens only), is one component of Calani Gick comprehensive MRSA colonization surveillance program. It is not intended to diagnose MRSA infection nor to guide or monitor treatment for MRSA infections. Test performance is not FDA approved in patients less than 2  years old. Performed at [facility], [address], [place], [place] [address]   C Difficile Quick Screen w PCR reflex     Status: None   Collection Time: 03/13/23 11:33 AM   Specimen: STOOL  Result Value Ref Range Status   C Diff antigen NEGATIVE NEGATIVE Final   C Diff toxin NEGATIVE NEGATIVE Final   C Diff interpretation No C. difficile detected.  Final    Comment: Performed at [facility], [address] [place], [place] [address]         Radiology Studies: CT Angio Chest Pulmonary Embolism (PE) W or WO Contrast Result Date: 03/12/2023 CLINICAL DATA:  chest pain, suspected pneumonia, rule out PE. EXAM: CT ANGIOGRAPHY CHEST WITH CONTRAST TECHNIQUE: Multidetector CT imaging of the chest was performed using the standard protocol during bolus administration of intravenous contrast. Multiplanar CT image reconstructions and MIPs were obtained to evaluate the vascular anatomy. RADIATION DOSE REDUCTION: This exam was performed according to the departmental dose-optimization program which includes automated exposure control, adjustment of the mA and/or kV according to patient size and/or use of iterative reconstruction technique. CONTRAST:  60mL OMNIPAQUE IOHEXOL 350 MG/ML SOLN COMPARISON:  CT scan chest from 08/05/2021.  FINDINGS: Cardiovascular: No evidence of embolism to the proximal subsegmental pulmonary artery level. Normal cardiac size. No pericardial effusion. No aortic aneurysm. There are coronary artery calcifications, in keeping with coronary artery disease. There are also mild peripheral atherosclerotic vascular calcifications of thoracic aorta and its major branches. Mediastinum/Nodes: Visualized lower neck is within normal limits. No solid / cystic mediastinal masses. The esophagus is nondistended precluding optimal assessment. No axillary, mediastinal or hilar lymphadenopathy by size criteria. Lungs/Pleura: The central tracheo-bronchial tree is patent. There are patchy ground-glass opacities throughout bilateral lungs, nonspecific in etiology. No mass or dense consolidation. No pleural effusion or pneumothorax. No suspicious lung nodules. Upper Abdomen: Surgically absent gallbladder. Remaining visualized upper abdominal viscera within normal limits. Musculoskeletal: The visualized soft tissues of the chest wall are grossly unremarkable. No suspicious osseous lesions. There are mild multilevel degenerative changes in the visualized spine. Right shoulder arthroplasty noted. Review of the MIP images confirms the above findings. IMPRESSION: 1. No embolism to the proximal subsegmental pulmonary artery level. 2. There are patchy ground-glass opacities throughout bilateral lungs, which is nonspecific in etiology. Differential diagnosis includes infection, pulmonary edema, ARDS, diffuse alveolar hemorrhage, etc. Correlate clinically. 3. Multiple other nonacute observations, as described above. Aortic Atherosclerosis (ICD10-I70.0). Electronically Signed   By: Jules Schick M.D.   On: 03/12/2023 15:29   CT ABDOMEN PELVIS W CONTRAST Result Date: 03/11/2023 CLINICAL DATA:  Abdominal pain, acute, nonlocalized Nausea, diarrhea, abdominal pain. Recent pneumonia with antibiotics. EXAM: CT ABDOMEN AND PELVIS WITH CONTRAST TECHNIQUE:  Multidetector CT imaging of the abdomen and pelvis was performed using the standard protocol following bolus administration of intravenous contrast. RADIATION DOSE REDUCTION: This exam was performed according to the departmental dose-optimization program which includes automated exposure control, adjustment of the mA and/or kV according to patient size and/or use of iterative reconstruction technique. CONTRAST:  75mL OMNIPAQUE IOHEXOL 300 MG/ML  SOLN COMPARISON:  Chest x-ray 03/11/2023, CT abdomen pelvis 08/13/2013, CT chest 08/05/2021 FINDINGS: Lower chest: Interval development of scattered peribronchovascular patchy airspace opacities. Bronchial wall thickening. Aortic valve calcification. Coronary artery calcification. Hepatobiliary: No focal liver abnormality. Status post cholecystectomy. No biliary dilatation. Pancreas: No focal lesion. Normal pancreatic contour. No surrounding inflammatory changes. No main pancreatic ductal dilatation. Spleen: Normal in size without focal abnormality.  Adrenals/Urinary Tract: No adrenal nodule bilaterally. Bilateral kidneys enhance symmetrically. No hydronephrosis. No hydroureter. The urinary bladder is unremarkable. On delayed imaging, there is no urothelial wall thickening and there are no filling defects in the opacified portions of the bilateral collecting systems or ureters. Stomach/Bowel: Stomach is within normal limits. No evidence of bowel wall thickening or dilatation. Status post appendectomy. Vascular/Lymphatic: No abdominal aorta or iliac aneurysm. Severe atherosclerotic plaque of the aorta and its branches. No abdominal, pelvic, or inguinal lymphadenopathy. Reproductive: Status post hysterectomy. No adnexal masses. Other: No intraperitoneal free fluid. No intraperitoneal free gas. No organized fluid collection. Musculoskeletal: Left lumbar hernia containing fat with an abdominal defect of 3 cm (2:35). No suspicious lytic or blastic osseous lesions. No acute  displaced fracture. Multilevel severe degenerative changes of the spine. L3-L5 interbody and posterolateral surgical hardware fusion. IMPRESSION: 1. Interval development of scattered peribronchovascular patchy airspace opacities. Findings suggestive of infection/inflammation. 2. Left lumbar hernia containing fat with an abdominal defect of 3 cm. 3. No acute intra-abdominal or intrapelvic abnormality. 4.  Aortic Atherosclerosis (ICD10-I70.0). Electronically Signed   By: Tish Frederickson M.D.   On: 03/11/2023 17:42   DG Chest Portable 1 View Result Date: 03/11/2023 CLINICAL DATA:  Cough, hypoxia, diarrhea for 3-4 days EXAM: PORTABLE CHEST 1 VIEW COMPARISON:  03/05/2023, 08/05/2021 FINDINGS: Single frontal view of the chest demonstrates Eilene Voigt stable cardiac silhouette. Chronic bibasilar scarring, without acute airspace disease, effusion, or pneumothorax. No acute bony abnormalities. Right shoulder arthroplasty. IMPRESSION: 1. Chronic scarring.  No acute process. Electronically Signed   By: Sharlet Salina M.D.   On: 03/11/2023 16:03        Scheduled Meds:  azithromycin  500 mg Oral q1800   enoxaparin (LOVENOX) injection  30 mg Subcutaneous Q24H   furosemide  40 mg Oral Daily   gabapentin  600 mg Oral BID   insulin aspart  0-15 Units Subcutaneous TID WC   insulin aspart  0-5 Units Subcutaneous QHS   levothyroxine  75 mcg Oral Q0600   methylPREDNISolone (SOLU-MEDROL) injection  40 mg Intravenous Q12H   metoprolol tartrate  12.5 mg Oral BID   pantoprazole  40 mg Oral Daily   Continuous Infusions:  sodium chloride 75 mL/hr at 03/12/23 2130   cefTRIAXone (ROCEPHIN)  IV 2 g (03/13/23 0903)     LOS: 2 days    Time spent: about 30 min, 2:07-2:37 in the room    Lacretia Nicks, MD [facility]   To contact the attending provider between 7A-7P or the covering provider during after hours 7P-7A, please log into the web site [email or web address] and access using universal [place] password for that  web site. If you do not have the password, please call the hospital operator.  03/13/2023, 3:01 PM

## 2023-03-13 NOTE — Progress Notes (Signed)
Mobility Specialist - Progress Note   03/13/23 1158  Mobility  Activity Transferred from bed to chair;Stood at bedside;Dangled on edge of bed  Level of Assistance Standby assist, set-up cues, supervision of patient - no hands on  Assistive Device Front wheel walker  Range of Motion/Exercises Active  Activity Response Tolerated well  Mobility Referral Yes  Mobility visit 1 Mobility  Mobility Specialist Start Time (ACUTE ONLY) 1139  Mobility Specialist Stop Time (ACUTE ONLY) 1155  Mobility Specialist Time Calculation (min) (ACUTE ONLY) 16 min   Received in bed and agreed to mobility. Did orthostatics per nurse request.  Lying: 134/71 (90) Pulse:76 Sitting EOB: 133/74 (93) Pulse:72 Standing: 131/75 (92) Pulse:81  Some dizziness when transitioning from supine to sit, no other complaints. Dizziness subsided in under a minute.   Returned to chair with all needs met. Family in room. Marilynne Halsted Mobility Specialist

## 2023-03-13 NOTE — Progress Notes (Signed)
  Echocardiogram 2D Echocardiogram has been performed.  Gabrielle [name] 03/13/2023, 3:50 PM

## 2023-03-13 NOTE — Plan of Care (Signed)

## 2023-03-13 NOTE — Plan of Care (Signed)
Problem: Clinical Measurements: Goal: Respiratory complications will improve Outcome: Progressing   Problem: Activity: Goal: Risk for activity intolerance will decrease Outcome: Progressing   Problem: Nutrition: Goal: Adequate nutrition will be maintained Outcome: Progressing   Problem: Safety: Goal: Ability to remain free from injury will improve Outcome: Progressing

## 2023-03-13 NOTE — Progress Notes (Signed)
   03/13/23 1358  TOC Brief Assessment  Insurance and Status Reviewed  Patient has primary care physician Yes  Home environment has been reviewed Home w/ spouse  Prior level of function: Modified independent  Prior/Current Home Services No current home services  Social Drivers of Health Review SDOH reviewed no interventions necessary  Readmission risk has been reviewed Yes  Transition of care needs no transition of care needs at this time

## 2023-03-13 NOTE — Plan of Care (Signed)
  Problem: Coping: Goal: Ability to adjust to condition or change in health will improve Outcome: Progressing   Problem: Nutritional: Goal: Maintenance of adequate nutrition will improve Outcome: Progressing   Problem: Activity: Goal: Risk for activity intolerance will decrease Outcome: Progressing   Problem: Nutrition: Goal: Adequate nutrition will be maintained Outcome: Progressing   Problem: Coping: Goal: Level of anxiety will decrease Outcome: Progressing   Problem: Activity: Goal: Ability to tolerate increased activity will improve Outcome: Progressing

## 2023-03-14 DIAGNOSIS — I10 Essential (primary) hypertension: Secondary | ICD-10-CM | POA: Diagnosis not present

## 2023-03-14 DIAGNOSIS — I2510 Atherosclerotic heart disease of native coronary artery without angina pectoris: Secondary | ICD-10-CM | POA: Diagnosis not present

## 2023-03-14 DIAGNOSIS — E785 Hyperlipidemia, unspecified: Secondary | ICD-10-CM | POA: Diagnosis not present

## 2023-03-14 DIAGNOSIS — J189 Pneumonia, unspecified organism: Secondary | ICD-10-CM | POA: Diagnosis not present

## 2023-03-14 DIAGNOSIS — G629 Polyneuropathy, unspecified: Secondary | ICD-10-CM

## 2023-03-14 LAB — GASTROINTESTINAL PANEL BY PCR, STOOL (REPLACES STOOL CULTURE)

## 2023-03-14 LAB — COMPREHENSIVE METABOLIC PANEL
ALT: 23 U/L (ref 0–44)
AST: 22 U/L (ref 15–41)
Albumin: 2.9 g/dL — ABNORMAL LOW (ref 3.5–5.0)
Alkaline Phosphatase: 58 U/L (ref 38–126)
Anion gap: 11 (ref 5–15)
BUN: 33 mg/dL — ABNORMAL HIGH (ref 8–23)
CO2: 20 mmol/L — ABNORMAL LOW (ref 22–32)
Calcium: 7.9 mg/dL — ABNORMAL LOW (ref 8.9–10.3)
Chloride: 101 mmol/L (ref 98–111)
Creatinine, Ser: 1.21 mg/dL — ABNORMAL HIGH (ref 0.44–1.00)
GFR, Estimated: 46 mL/min — ABNORMAL LOW (ref >60)
Glucose, Bld: 138 mg/dL — ABNORMAL HIGH (ref 70–99)
Potassium: 4.1 mmol/L (ref 3.5–5.1)
Sodium: 132 mmol/L — ABNORMAL LOW (ref 135–145)
Total Bilirubin: 0.6 mg/dL (ref 0.0–1.2)
Total Protein: 5.5 g/dL — ABNORMAL LOW (ref 6.5–8.1)

## 2023-03-14 LAB — RESPIRATORY PANEL BY PCR

## 2023-03-14 LAB — CBC WITH DIFFERENTIAL/PLATELET
Abs Immature Granulocytes: 0.06 10*3/uL (ref 0.00–0.07)
Basophils Absolute: 0 10*3/uL (ref 0.0–0.1)
Basophils Relative: 0 %
Eosinophils Absolute: 0 10*3/uL (ref 0.0–0.5)
Eosinophils Relative: 0 %
HCT: 35.1 % — ABNORMAL LOW (ref 36.0–46.0)
Hemoglobin: 11.7 g/dL — ABNORMAL LOW (ref 12.0–15.0)
Immature Granulocytes: 1 %
Lymphocytes Relative: 5 %
Lymphs Abs: 0.4 10*3/uL — ABNORMAL LOW (ref 0.7–4.0)
MCH: 30.7 pg (ref 26.0–34.0)
MCHC: 33.3 g/dL (ref 30.0–36.0)
MCV: 92.1 fL (ref 80.0–100.0)
Monocytes Absolute: 0.5 10*3/uL (ref 0.1–1.0)
Monocytes Relative: 7 %
Neutro Abs: 6.8 10*3/uL (ref 1.7–7.7)
Neutrophils Relative %: 87 %
Platelets: 267 10*3/uL (ref 150–400)
RBC: 3.81 MIL/uL — ABNORMAL LOW (ref 3.87–5.11)
RDW: 13.7 % (ref 11.5–15.5)
WBC: 7.7 10*3/uL (ref 4.0–10.5)
nRBC: 0 % (ref 0.0–0.2)

## 2023-03-14 LAB — GLUCOSE, CAPILLARY
Glucose-Capillary: 116 mg/dL — ABNORMAL HIGH (ref 70–99)
Glucose-Capillary: 123 mg/dL — ABNORMAL HIGH (ref 70–99)
Glucose-Capillary: 194 mg/dL — ABNORMAL HIGH (ref 70–99)
Glucose-Capillary: 138 mg/dL — ABNORMAL HIGH (ref 70–99)

## 2023-03-14 LAB — PHOSPHORUS: Phosphorus: 1.9 mg/dL — ABNORMAL LOW (ref 2.5–4.6)

## 2023-03-14 LAB — PROCALCITONIN: Procalcitonin: <0.1 ng/mL

## 2023-03-14 LAB — LEGIONELLA PNEUMOPHILA SEROGP 1 UR AG: L. pneumophila Serogp 1 Ur Ag: NEGATIVE

## 2023-03-14 LAB — C-REACTIVE PROTEIN: CRP: 0.6 mg/dL (ref <1.0)

## 2023-03-14 LAB — MAGNESIUM: Magnesium: 2 mg/dL (ref 1.7–2.4)

## 2023-03-14 MED ORDER — ENOXAPARIN SODIUM 40 MG/0.4ML IJ SOSY
40.0000 mg | PREFILLED_SYRINGE | INTRAMUSCULAR | Status: DC
  Administered 2023-03-15: 40 mg via SUBCUTANEOUS
  Filled 2023-03-14: qty 0.4

## 2023-03-14 MED ORDER — IPRATROPIUM-ALBUTEROL 0.5-2.5 (3) MG/3ML IN SOLN
3.0000 mL | Freq: Two times a day (BID) | RESPIRATORY_TRACT | Status: DC
  Administered 2023-03-15: 3 mL via RESPIRATORY_TRACT
  Filled 2023-03-14: qty 3

## 2023-03-14 NOTE — Progress Notes (Signed)
[facility]  PROGRESS NOTE  Gabrielle [name]:[id number] DOB: Apr 18, 1945 DOA: 03/11/2023 PCP: Daisy Floro, MD   Brief HPI:    78 y.o. female with medical history significant of coronary artery disease, type 2 diabetes, chronic kidney disease stage III, GERD, hypothyroidism, peripheral vascular disease, who was treated for pneumonia and influenza prior to presentation.  She completed a course of tamiflu.   She was taking steroids and doxycycline prior to admission.  Admitted with post influenza pneumonia.       Assessment/Plan:    Post influenza pneumonia -Empirically started on Rocephin and Zithromax -Procalcitonin less than 0.10 x 2 -CT chest showed patchy groundglass opacities throughout bilateral lung fields -Likely post influenza pneumonia -Continue prednisone taper -Negative influenza, RSV, COVID-19  Diarrhea -C. difficile negative, GI pathogen panel negative -CT abdomen/pelvis on 2/9 showed no acute intra-abdominal pathology -Diarrhea has resolved  Chronic diastolic CHF -Echocardiogram showed grade 1 diastolic dysfunction -EF 55 to 60% -Mild elevation of troponin, not consistent with acute coronary syndrome -Diuresed well with p.o. Lasix as above -Patient takes Maxide at home, will continue taking that as outpatient  Hypertension -ACE inhibitor and HCTZ on hold -Blood pressure is stable -Continue metoprolol 12.5 mg p.o. twice daily  Hypothyroidism -Continue Synthroid  Acute kidney injury -Creatinine 1.21 -Likely has underlying CKD, she is on ACE inhibitor and HCTZ at home -Follow BMP in am     Medications     azithromycin  500 mg Oral q1800   [START ON 03/15/2023] enoxaparin (LOVENOX) injection  40 mg Subcutaneous Q24H   furosemide  40 mg Oral Daily   gabapentin  600 mg Oral BID   insulin aspart  0-15 Units Subcutaneous TID WC   insulin aspart  0-5 Units Subcutaneous QHS   ipratropium-albuterol  3 mL Nebulization Q6H WA   levothyroxine  75  mcg Oral Q0600   metoprolol tartrate  12.5 mg Oral BID   pantoprazole  40 mg Oral Daily   predniSONE  40 mg Oral Q breakfast     Data Reviewed:   CBG:  Recent Labs  Lab 03/13/23 1646 03/13/23 2033 03/13/23 2145 03/14/23 0733 03/14/23 1121  GLUCAP 122* 188* 179* 116* 123*    SpO2: 100 % O2 Flow Rate (L/min): 2 L/min    Vitals:   03/14/23 0600 03/14/23 0818 03/14/23 1008 03/14/23 1305  BP:  (!) 122/56  (!) 142/91  Pulse:  75  67  Resp: 18   16  Temp:    (!) 97.5 F (36.4 C)  TempSrc:      SpO2:   98% 100%  Weight:      Height:          Data Reviewed:  Basic Metabolic Panel: Recent Labs  Lab 03/11/23 1544 03/12/23 0530 03/13/23 0603 03/14/23 0611  NA 130*  --  131* 132*  K 3.5  --  3.4* 4.1  CL 94*  --  99 101  CO2 22  --  23 20*  GLUCOSE 110*  --  108* 138*  BUN 66*  --  35* 33*  CREATININE 1.60* 1.35* 1.14* 1.21*  CALCIUM 8.6*  --  7.9* 7.9*  MG  --   --  1.2* 2.0  PHOS  --   --  2.6 1.9*    CBC: Recent Labs  Lab 03/11/23 1544 03/12/23 0530 03/13/23 0603 03/14/23 0611  WBC 14.4* 9.0 8.7 7.7  NEUTROABS 11.5*  --  7.7 6.8  HGB 14.1 12.6 12.6 11.7*  HCT  40.3 35.7* 36.7 35.1*  MCV 88.6 89.5 90.6 92.1  PLT 298 257 259 267    LFT Recent Labs  Lab 03/11/23 1544 03/13/23 0603 03/14/23 0611  AST 34 26 22  ALT 32 24 23  ALKPHOS 73 65 58  BILITOT 0.9 0.9 0.6  PROT 6.2* 5.9* 5.5*  ALBUMIN 3.2* 2.9* 2.9*     Antibiotics: Anti-infectives (From admission, onward)    Start     Dose/Rate Route Frequency Ordered Stop   03/12/23 1800  azithromycin (ZITHROMAX) tablet 500 mg        500 mg Oral Daily-1800 03/12/23 0944 03/16/23 1759   03/12/23 0458  cefTRIAXone (ROCEPHIN) 2 g in sodium chloride 0.9 % 100 mL IVPB        2 g 200 mL/hr over 30 Minutes Intravenous Every 24 hours 03/12/23 0458 03/17/23 0759   03/12/23 0458  azithromycin (ZITHROMAX) 500 mg in sodium chloride 0.9 % 250 mL IVPB  Status:  Discontinued        500 mg 250 mL/hr over 60  Minutes Intravenous Every 24 hours 03/12/23 0458 03/12/23 0944   03/11/23 1815  cefTRIAXone (ROCEPHIN) 1 g in sodium chloride 0.9 % 100 mL IVPB        1 g 200 mL/hr over 30 Minutes Intravenous  Once 03/11/23 1804 03/11/23 1852   03/11/23 1815  azithromycin (ZITHROMAX) 500 mg in sodium chloride 0.9 % 250 mL IVPB        500 mg 250 mL/hr over 60 Minutes Intravenous  Once 03/11/23 1804 03/11/23 1952        DVT prophylaxis: Lovenox  Code Status: Full code  Family Communication: Discussed with patient's husband at bedside   CONSULTS    Subjective   Denies cough.  Breathing has improved.   Objective    Physical Examination:   General-appears in no acute distress Heart-S1-S2, regular, no murmur auscultated Lungs-bibasilar crackles auscultated Abdomen-soft, nontender, no organomegaly Extremities-no edema in the lower extremities Neuro-alert, oriented x3, no focal deficit noted  Status is: Inpatient:             Isaid Salvia S Cotina Freedman   [facility] If 7PM-7AM, please contact night-coverage at [email or web address], Office  [phone]   03/14/2023, 4:02 PM  LOS: 3 days

## 2023-03-14 NOTE — TOC CM/SW Note (Signed)

## 2023-03-14 NOTE — TOC Initial Note (Signed)
Transition of Care [facility]) - Initial/Assessment Note    Patient Details  Name: Gabrielle [name] MRN: [id number] Date of Birth: Jul 27, 1945  Transition of [facility]) CM/SW Contact:    Otelia Santee, LCSW Phone Number: 03/14/2023, 12:40 PM  Clinical Narrative:                 Met with pt and confirmed plan to return home with home health services. Pt does not have agency preference. HHPT arranged with [facility]. HH order will need to be placed prior to discharge.   Expected Discharge Plan: Home w Home Health Services Barriers to Discharge: No Barriers Identified   Patient Goals and CMS Choice Patient states their goals for this hospitalization and ongoing recovery are:: To return home CMS Medicare.gov Compare Post Acute Care list provided to:: Patient Choice offered to / list presented to : Patient      Expected Discharge Plan and Services In-house Referral: Clinical Social Work Discharge Planning Services: NA Post Acute Care Choice: Home Health Living arrangements for the past 2 months: Single Family Home                 DME Arranged: N/A DME Agency: NA       HH Arranged: PT HH Agency: [facility] Date HH Agency Contacted: 03/14/23 Time HH Agency Contacted: 1239 Representative spoke with at [facility] Agency: Elnita Maxwell  Prior Living Arrangements/Services Living arrangements for the past 2 months: Single Family Home Lives with:: Spouse Patient language and need for interpreter reviewed:: Yes Do you feel safe going back to the place where you live?: Yes      Need for Family Participation in Patient Care: No (Comment) Care giver support system in place?: No (comment) Current home services: DME Criminal Activity/Legal Involvement Pertinent to Current Situation/Hospitalization: No - Comment as needed  Activities of Daily Living   ADL Screening (condition at time of admission) Independently performs ADLs?: Yes (appropriate for developmental age) Is the patient  deaf or have difficulty hearing?: No Does the patient have difficulty seeing, even when wearing glasses/contacts?: Yes Does the patient have difficulty concentrating, remembering, or making decisions?: No  Permission Sought/Granted Permission sought to share information with : Facility Engineer, maintenance (IT) granted to share info w AGENCY: [facility]        Emotional Assessment Appearance:: Appears stated age Attitude/Demeanor/Rapport: Engaged Affect (typically observed): Accepting Orientation: : Oriented to Self, Oriented to Place, Oriented to  Time, Oriented to Situation Alcohol / Substance Use: Not Applicable Psych Involvement: No (comment)  Admission diagnosis:  Dehydration [E86.0] Nausea [R11.0] CAP (community acquired pneumonia) [J18.9] AKI (acute kidney injury) (HCC) [N17.9] Abdominal pain, unspecified abdominal location [R10.9] Pneumonia due to infectious organism, unspecified laterality, unspecified part of lung [J18.9] Diarrhea, unspecified type [R19.7] Patient Active Problem List   Diagnosis Date Noted   Pneumonia due to infectious organism 03/11/2023   Type 2 diabetes mellitus without complication, without long-term current use of insulin (HCC) 09/04/2022   Obesity, Class II, BMI 35-39.9 09/04/2022   Peripheral sensory-motor axonal polyneuropathy 07/17/2022   Idiopathic peripheral neuropathy 05/15/2022   B12 deficiency 05/15/2022   Allergy 01/01/2020   Status post total shoulder arthroplasty, right 06/05/2019   Lung nodules 11/18/2018   Pulmonary nodule less than 1 cm in diameter with moderate to high risk for malignant neoplasm 09/16/2018   Cholecystitis with cholelithiasis 03/22/2018   S/P lumbar fusion 03/07/2017   Chronic low back pain 02/26/2017   Hematoma of  arm 08/24/2015   Coronary artery disease involving native coronary artery of native heart without angina pectoris    Dyslipidemia 08/22/2015   Heart murmur    History of ST elevation  myocardial infarction (STEMI) 08/21/2015   Appendicitis, acute 08/14/2013   S/P laparoscopic appendectomy 08/13/2013   Neuropathy 11/11/2012   Hypertension 01/17/2011   Osteoarthritis 01/30/1998   PCP:  Daisy Floro, MD Pharmacy:   [facility] [phone] [facility] - [place], [place] - [address] [place] [address] Phone: [phone] Fax: [phone]  [facility] - [place], [place] - [address] [facility] [address] [place] [place] [address] Phone: [phone] Fax: [phone]  [facility] [address] [place] [address] Phone: [phone] Fax: [phone]     Social Drivers of Health (SDOH) Social History: SDOH Screenings   Food Insecurity: No Food Insecurity (03/12/2023)  Housing: Low Risk  (03/12/2023)  Transportation Needs: No Transportation Needs (03/12/2023)  Utilities: Not At Risk (03/12/2023)  Social Connections: Moderately Integrated (03/12/2023)  Tobacco Use: Medium Risk (02/21/2023)   SDOH Interventions:     Readmission Risk Interventions    03/14/2023   12:38 PM 03/13/2023    1:58 PM  Readmission Risk Prevention Plan  Transportation Screening Complete Complete  PCP or Specialist Appt within 5-7 Days Complete Complete  Home Care Screening Complete Complete  Medication Review (RN CM) Complete Complete

## 2023-03-14 NOTE — Evaluation (Signed)
Physical Therapy Evaluation Patient Details Name: Gabrielle [name] MRN: [id number] DOB: March 02, 1945 Today's Date: 03/14/2023  History of Present Illness  Gabrielle [name] is a 78 y.o. female admitted with pneumonia. PMH: coronary artery disease, type 2 diabetes, chronic kidney disease stage III, GERD, hypothyroidism, peripheral vascular disease, neuropathy  Clinical Impression  Pt admitted with above diagnosis. Pt from home with spouse, ind at baseline, using SPC or RW since feeling ill and since ankle sprain ~3 weeks ago. On eval, pt slow to mobilize, noted to easily fatigue, on RA with SpO2 97% and HR 70s-80s. Pt ambulates 156ft with CGA using RW, step through gait pattern, decreased cadence, needing 1 standing rest break due to fatigue. Anticipate HHPT at d/c, has all DME and spouse support at home; pt may progress to not needing any post-acute PT at home. Pt currently with functional limitations due to the deficits listed below (see PT Problem List). Pt will benefit from acute skilled PT to increase their independence and safety with mobility to allow discharge.           If plan is discharge home, recommend the following: A little help with walking and/or transfers;A little help with bathing/dressing/bathroom;Assistance with cooking/housework;Assist for transportation;Help with stairs or ramp for entrance   Can travel by private vehicle        Equipment Recommendations None recommended by PT  Recommendations for Other Services       Functional Status Assessment Patient has had a recent decline in their functional status and demonstrates the ability to make significant improvements in function in a reasonable and predictable amount of time.     Precautions / Restrictions Precautions Precautions: Fall Restrictions Weight Bearing Restrictions Per Provider Order: No      Mobility  Bed Mobility Overal bed mobility: Modified Independent             General bed mobility comments:  slightly increasead time and effort, no physical assist or cues    Transfers Overall transfer level: Needs assistance Equipment used: Rolling walker (2 wheels) Transfers: Sit to/from Stand Sit to Stand: Supervision           General transfer comment: supv to power to stand, verbal cues for hand placement to power up from EOB, slow to rise    Ambulation/Gait Ambulation/Gait assistance: Contact guard assist Gait Distance (Feet): 100 Feet Assistive device: Rolling walker (2 wheels) Gait Pattern/deviations: Step-through pattern, Decreased stride length Gait velocity: decreased     General Gait Details: step through gait pattern, slow cadence, decreased heel-toe pattern, 1 standing rest break, dyspnea noted on RA, cued pt for pursed lip breathing and SpO2 97% on RA  Stairs            Wheelchair Mobility     Tilt Bed    Modified Rankin (Stroke Patients Only)       Balance Overall balance assessment: Needs assistance Sitting-balance support: Feet supported Sitting balance-Leahy Scale: Good     Standing balance support: Reliant on assistive device for balance, During functional activity, Bilateral upper extremity supported Standing balance-Leahy Scale: Fair Standing balance comment: static stand without UE support, using RW for ambulation                             Pertinent Vitals/Pain Pain Assessment Pain Assessment: No/denies pain    Home Living Family/patient expects to be discharged to:: Private residence Living Arrangements: Spouse/significant other Available Help at Discharge: Family;Available 24  hours/day Type of Home: House Home Access: Stairs to enter   Entrance Stairs-Number of Steps: 3 Alternate Level Stairs-Number of Steps: flight Home Layout: Two level;Bed/bath upstairs Home Equipment: Rolling Walker (2 wheels);Cane - single point;Grab bars - tub/shower      Prior Function Prior Level of Function : Independent/Modified  Independent;Driving             Mobility Comments: pt reports ind with community amb, using SPC or RW when feeling ill and spraining ankle, reports 1 fall ~3 weeks ago when sitting EOB then blacked out and was on the ground resulting in L ankle "sprain" per pt ADLs Comments: pt reports ind with ADLs/IADLs, spouse does majority of the cooking typically     Extremity/Trunk Assessment   Upper Extremity Assessment Upper Extremity Assessment: Overall WFL for tasks assessed    Lower Extremity Assessment Lower Extremity Assessment: Generalized weakness (AROM WFL, strength grossly 3+/5, L ankle brusie noted but AROM WFL)    Cervical / Trunk Assessment Cervical / Trunk Assessment: Normal  Communication   Communication Communication: No apparent difficulties    Cognition Arousal: Alert Behavior During Therapy: WFL for tasks assessed/performed   PT - Cognitive impairments: No apparent impairments                         Following commands: Intact       Cueing       General Comments General comments (skin integrity, edema, etc.): pt on RA with SpO2 97% and HR 70s-80s with ambulation    Exercises     Assessment/Plan    PT Assessment Patient needs continued PT services  PT Problem List Decreased strength;Decreased activity tolerance;Decreased balance;Cardiopulmonary status limiting activity       PT Treatment Interventions DME instruction;Gait training;Stair training;Functional mobility training;Therapeutic activities;Therapeutic exercise;Balance training;Neuromuscular re-education;Patient/family education;Manual techniques    PT Goals (Current goals can be found in the Care Plan section)  Acute Rehab PT Goals Patient Stated Goal: return home PT Goal Formulation: With patient/family Time For Goal Achievement: 03/28/23 Potential to Achieve Goals: Good    Frequency Min 1X/week     Co-evaluation               AM-PAC PT "6 Clicks" Mobility  Outcome  Measure Help needed turning from your back to your side while in a flat bed without using bedrails?: None Help needed moving from lying on your back to sitting on the side of a flat bed without using bedrails?: None Help needed moving to and from a bed to a chair (including a wheelchair)?: A Little Help needed standing up from a chair using your arms (e.g., wheelchair or bedside chair)?: A Little Help needed to walk in hospital room?: A Little Help needed climbing 3-5 steps with a railing? : A Lot 6 Click Score: 19    End of Session Equipment Utilized During Treatment: Gait belt Activity Tolerance: Patient tolerated treatment well Patient left: in chair;with call bell/phone within reach;with chair alarm set;with family/visitor present;Other (comment) (RT in room with breathing treatment) Nurse Communication: Mobility status;Other (comment) (SpO2 on RA) PT Visit Diagnosis: Other abnormalities of gait and mobility (R26.89);Muscle weakness (generalized) (M62.81)    Time: 1610-9604 PT Time Calculation (min) (ACUTE ONLY): 25 min   Charges:   PT Evaluation $PT Eval Low Complexity: 1 Low PT Treatments $Gait Training: 8-22 mins PT General Charges $$ ACUTE PT VISIT: 1 Visit         Tori Romell Wolden PT,  DPT 03/14/23, 10:21 AM

## 2023-03-15 DIAGNOSIS — N179 Acute kidney failure, unspecified: Secondary | ICD-10-CM | POA: Diagnosis not present

## 2023-03-15 DIAGNOSIS — I10 Essential (primary) hypertension: Secondary | ICD-10-CM | POA: Diagnosis not present

## 2023-03-15 DIAGNOSIS — R109 Unspecified abdominal pain: Secondary | ICD-10-CM | POA: Diagnosis not present

## 2023-03-15 DIAGNOSIS — J189 Pneumonia, unspecified organism: Secondary | ICD-10-CM | POA: Diagnosis not present

## 2023-03-15 LAB — BASIC METABOLIC PANEL
Anion gap: 10 (ref 5–15)
BUN: 31 mg/dL — ABNORMAL HIGH (ref 8–23)
CO2: 23 mmol/L (ref 22–32)
Calcium: 7.9 mg/dL — ABNORMAL LOW (ref 8.9–10.3)
Chloride: 96 mmol/L — ABNORMAL LOW (ref 98–111)
Creatinine, Ser: 1.05 mg/dL — ABNORMAL HIGH (ref 0.44–1.00)
GFR, Estimated: 55 mL/min — ABNORMAL LOW (ref >60)
Glucose, Bld: 86 mg/dL (ref 70–99)
Potassium: 3.3 mmol/L — ABNORMAL LOW (ref 3.5–5.1)
Sodium: 129 mmol/L — ABNORMAL LOW (ref 135–145)

## 2023-03-15 LAB — GLUCOSE, CAPILLARY
Glucose-Capillary: 87 mg/dL (ref 70–99)
Glucose-Capillary: 124 mg/dL — ABNORMAL HIGH (ref 70–99)

## 2023-03-15 MED ORDER — PREDNISONE 10 MG PO TABS: ORAL_TABLET | ORAL | 0 refills | Status: DC

## 2023-03-15 MED ORDER — K PHOS MONO-SOD PHOS DI & MONO 155-852-130 MG PO TABS: 250.0000 mg | ORAL_TABLET | Freq: Three times a day (TID) | ORAL | 0 refills | Status: AC

## 2023-03-15 MED ORDER — IPRATROPIUM-ALBUTEROL 0.5-2.5 (3) MG/3ML IN SOLN: 3.0000 mL | Freq: Four times a day (QID) | RESPIRATORY_TRACT | 0 refills | Status: DC | PRN

## 2023-03-15 MED ORDER — GUAIFENESIN ER 600 MG PO TB12: 600.0000 mg | ORAL_TABLET | Freq: Two times a day (BID) | ORAL | 0 refills | Status: AC

## 2023-03-15 MED ORDER — FUROSEMIDE 20 MG PO TABS: 20.0000 mg | ORAL_TABLET | Freq: Every day | ORAL | 11 refills | Status: AC

## 2023-03-15 MED ORDER — POTASSIUM CHLORIDE CRYS ER 20 MEQ PO TBCR
40.0000 meq | EXTENDED_RELEASE_TABLET | Freq: Once | ORAL | Status: AC
  Administered 2023-03-15: 40 meq via ORAL
  Filled 2023-03-15: qty 2

## 2023-03-15 NOTE — Discharge Summary (Signed)
Physician Discharge Summary   Patient: Gabrielle [name] MRN: [id number] DOB: Sep 04, 1945  Admit date:     03/11/2023  Discharge date: 03/15/23  Discharge Physician: Meredeth Ide   PCP: Daisy Floro, MD   Recommendations at discharge:   Follow-up PCP in 1 week  Discharge Diagnoses: Principal Problem:   Pneumonia due to infectious organism Active Problems:   Neuropathy   Dyslipidemia   Hypertension   Coronary artery disease involving native coronary artery of native heart without angina pectoris   Type 2 diabetes mellitus without complication, without long-term current use of insulin (HCC)   Obesity, Class II, BMI 35-39.9  Resolved Problems:   * No resolved hospital problems. *  Hospital Course:  78 y.o. female with medical history significant of coronary artery disease, type 2 diabetes, chronic kidney disease stage III, GERD, hypothyroidism, peripheral vascular disease, who was treated for pneumonia and influenza prior to presentation.  She completed a course of tamiflu.   She was taking steroids and doxycycline prior to admission.  Admitted with post influenza pneumonia.   Assessment and Plan:  Post influenza pneumonia -Empirically started on Rocephin and Zithromax -Procalcitonin less than 0.10 x 2 -CT chest showed patchy groundglass opacities throughout bilateral lung fields -Likely post influenza pneumonia -Continue prednisone taper -Negative influenza, RSV, COVID-19 -Patient received 4 days of antibiotics in the hospital, will not discharge on antibiotics. -Will discharge on prednisone taper, Mucinex, DuoNeb nebulizers as needed.   Diarrhea -C. difficile negative, GI pathogen panel negative -CT abdomen/pelvis on 2/9 showed no acute intra-abdominal pathology -Diarrhea has resolved   Chronic diastolic CHF -Echocardiogram showed grade 1 diastolic dysfunction -EF 55 to 60% -Mild elevation of troponin, not consistent with acute coronary syndrome -Diuresed well with  p.o. Lasix as above -Patient takes Maxide at home, will discontinue Maxide -Start on Lasix 20 mg p.o. daily -Follow-up cardiology as outpatient   Hypertension -Continue losartan 25 mg daily -Continue metoprolol 12.5 mg p.o. twice daily   Hypothyroidism -Continue Synthroid   Acute kidney injury -Creatinine has improved with diuresis with Lasix  Phosphatemia -Phosphorus 1.9 -Will discharge on K-Phos Neutral for 3 days.        Consultants:  Procedures performed:   Disposition: Home Diet recommendation:  Discharge Diet Orders (From admission, onward)     Start     Ordered   03/15/23 0000  Diet - low sodium heart healthy        03/15/23 1413           Regular diet DISCHARGE MEDICATION: Allergies as of 03/15/2023       Reactions   Pravastatin Other (See Comments)   Effects liver function   Nsaids    Other reaction(s): intol   Penicillins Other (See Comments)   Allergy as an infant - no other information available Has patient had a PCN reaction causing immediate rash, facial/tongue/throat swelling, SOB or lightheadedness with hypotension: Unknown Has patient had a PCN reaction causing severe rash involving mucus membranes or skin necrosis: Unknown Has patient had a PCN reaction that required hospitalization: No Has patient had a PCN reaction occurring within the last 10 years: No If all of the above answers are "NO", then may proceed with Cephalospor   Pravastatin Sodium    Other reaction(s): elevated LFT's        Medication List     STOP taking these medications    doxycycline 100 MG tablet Commonly known as: VIBRA-TABS   oseltamivir 75 MG capsule Commonly known as:  TAMIFLU   triamterene-hydrochlorothiazide 37.5-25 MG tablet Commonly known as: MAXZIDE-25       TAKE these medications    acetaminophen 650 MG CR tablet Commonly known as: TYLENOL Take 1,950 mg by mouth daily at 6 (six) AM.   Alpha-Lipoic Acid 600 MG Caps Take 1 capsule (600 mg  total) by mouth 2 (two) times daily.   Arexvy 120 MCG/0.5ML injection Generic drug: RSV vaccine recomb adjuvanted Inject into the muscle.   aspirin EC 81 MG tablet Take 81 mg by mouth at bedtime.   benazepril 20 MG tablet Commonly known as: LOTENSIN Take 1 tablet (20 mg total) by mouth 2 (two) times daily. What changed: when to take this   CALCIUM 600 + D PO Take 1 tablet by mouth 2 (two) times daily.   docusate sodium 100 MG capsule Commonly known as: COLACE Take 100 mg by mouth every other day.   furosemide 20 MG tablet Commonly known as: Lasix Take 1 tablet (20 mg total) by mouth daily.   gabapentin 600 MG tablet Commonly known as: NEURONTIN Take 600 mg by mouth 2 (two) times daily.   GLUCOSAMINE CHONDR 1500 COMPLX PO Take 2 tablets by mouth daily. 1500/1200   guaiFENesin 600 MG 12 hr tablet Commonly known as: Mucinex Take 1 tablet (600 mg total) by mouth 2 (two) times daily for 10 days.   icosapent Ethyl 1 g capsule Commonly known as: Vascepa Take 2 capsules (2 g total) by mouth 2 (two) times daily.   ipratropium-albuterol 0.5-2.5 (3) MG/3ML Soln Commonly known as: DUONEB Take 3 mLs by nebulization every 6 (six) hours as needed (Shortness of breath).   levothyroxine 75 MCG tablet Commonly known as: SYNTHROID Take 75 mcg by mouth daily before breakfast.   loratadine-pseudoephedrine 10-240 MG 24 hr tablet Commonly known as: CLARITIN-D 24-hour Take 1 tablet by mouth every evening.   meclizine 25 MG tablet Commonly known as: ANTIVERT Take 25 mg by mouth 3 (three) times daily as needed for dizziness.   Melatonin 10 MG Tabs Take 10 mg by mouth at bedtime.   metoprolol tartrate 25 MG tablet Commonly known as: LOPRESSOR Take 0.5 tablets (12.5 mg total) by mouth 2 (two) times daily.   Mounjaro 15 MG/0.5ML Pen Generic drug: tirzepatide Inject 15 mg into the skin once a week.   Nexlizet 180-10 MG Tabs Generic drug: Bempedoic Acid-Ezetimibe TAKE 1 TABLET  BY MOUTH EVERY DAY   nitroGLYCERIN 0.4 MG SL tablet Commonly known as: NITROSTAT Place 0.4 mg under the tongue every 5 (five) minutes as needed for chest pain.   NON FORMULARY Place 1 drop under the tongue daily. CBD Oil   omeprazole 20 MG capsule Commonly known as: PRILOSEC Take 20 mg by mouth daily with breakfast.   phosphorus 155-852-130 MG tablet Commonly known as: K PHOS NEUTRAL Take 1 tablet (250 mg total) by mouth 3 (three) times daily for 3 days.   polyethylene glycol 17 g packet Commonly known as: MIRALAX / GLYCOLAX Take 17 g by mouth daily as needed for mild constipation.   predniSONE 10 MG tablet Commonly known as: DELTASONE Prednisone 40 mg po daily x 1 day then Prednisone 30 mg po daily x 1 day then Prednisone 20 mg po daily x 1 day then Prednisone 10 mg daily x 1 day then stop... What changed:  medication strength how much to take how to take this when to take this additional instructions   rosuvastatin 40 MG tablet Commonly known as: CRESTOR TAKE 1  TABLET BY MOUTH EVERYDAY AT BEDTIME   VITAMIN B-12 PO Take 5,000 Units by mouth every other day.   Vitamin D 50 MCG (2000 UT) Caps Take 2,000 Units by mouth daily.        Follow-up Information     [facility] Follow up.   Why: [facility] will follow up with you at discharge to provide home health services Contact information: [address] [place] [address] [phone]         Daisy Floro, MD Follow up in 1 week(s).   Specialty: Family Medicine Contact information: [address] [place] [address] [phone]                Discharge Exam: Ceasar Mons Weights   03/11/23 1447 03/14/23 0500 03/15/23 0153  Weight: 68.5 kg 76.3 kg 77.1 kg   General-appears in no acute distress Heart-S1-S2, regular, no murmur auscultated Lungs-clear to auscultation bilaterally, no wheezing or crackles auscultated Abdomen-soft, nontender, no organomegaly Extremities-no edema in the lower  extremities Neuro-alert, oriented x3, no focal deficit noted  Condition at discharge: good  The results of significant diagnostics from this hospitalization (including imaging, microbiology, ancillary and laboratory) are listed below for reference.   Imaging Studies: ECHOCARDIOGRAM COMPLETE Result Date: 03/13/2023    ECHOCARDIOGRAM REPORT   Patient Name:   TUWANNA KRAUSZ Date of Exam: 03/13/2023 Medical Rec #:  [id number]      Height:       59.5 in Accession #:    [id number]     Weight:       151.0 lb Date of Birth:  [birth year]     BSA:          1.646 m Patient Age:    77 years       BP:           138/55 mmHg Patient Gender: F              HR:           73 bpm. Exam Location:  Inpatient Procedure: 2D Echo, Cardiac Doppler and Color Doppler Indications:    R07.9* Chest pain, unspecified  History:        Patient has prior history of Echocardiogram examinations, most                 recent 02/15/2021. CAD and Previous Myocardial Infarction,                 Signs/Symptoms:Dyspnea; Risk Factors:Hypertension, Dyslipidemia                 and Diabetes. Recent FLU.  Sonographer:    Sheralyn Boatman RDCS Referring Phys: [phone] A CALDWELL POWELL JR  Sonographer Comments: Technically difficult study due to poor echo windows. Image acquisition challenging due to patient body habitus. IMPRESSIONS  1. Left ventricular ejection fraction, by estimation, is 55 to 60%. The left ventricle has normal function. Left ventricular endocardial border not optimally defined to evaluate regional wall motion. Left ventricular diastolic parameters are consistent with Grade I diastolic dysfunction (impaired relaxation).  2. Right ventricular systolic function is normal. The right ventricular size is normal. Tricuspid regurgitation signal is inadequate for assessing PA pressure.  3. The mitral valve is grossly normal. Trivial mitral valve regurgitation. No evidence of mitral stenosis.  4. The aortic valve was not well visualized. Aortic valve  regurgitation is not visualized. No aortic stenosis is present.  5. The inferior vena cava is normal in size with greater than  50% respiratory variability, suggesting right atrial pressure of 3 mmHg. FINDINGS  Left Ventricle: Left ventricular ejection fraction, by estimation, is 55 to 60%. The left ventricle has normal function. Left ventricular endocardial border not optimally defined to evaluate regional wall motion. The left ventricular internal cavity size was normal in size. There is no left ventricular hypertrophy. Left ventricular diastolic parameters are consistent with Grade I diastolic dysfunction (impaired relaxation). Right Ventricle: The right ventricular size is normal. No increase in right ventricular wall thickness. Right ventricular systolic function is normal. Tricuspid regurgitation signal is inadequate for assessing PA pressure. Left Atrium: Left atrial size was normal in size. Right Atrium: Right atrial size was normal in size. Pericardium: There is no evidence of pericardial effusion. Mitral Valve: The mitral valve is grossly normal. Mild mitral annular calcification. Trivial mitral valve regurgitation. No evidence of mitral valve stenosis. Tricuspid Valve: The tricuspid valve is grossly normal. Tricuspid valve regurgitation is trivial. No evidence of tricuspid stenosis. Aortic Valve: The aortic valve was not well visualized. Aortic valve regurgitation is not visualized. No aortic stenosis is present. Pulmonic Valve: The pulmonic valve was not well visualized. Pulmonic valve regurgitation is not visualized. No evidence of pulmonic stenosis. Aorta: The aortic root and ascending aorta are structurally normal, with no evidence of dilitation. Venous: The inferior vena cava is normal in size with greater than 50% respiratory variability, suggesting right atrial pressure of 3 mmHg. IAS/Shunts: The atrial septum is grossly normal.  LEFT VENTRICLE PLAX 2D LVIDd:         3.60 cm     Diastology LVIDs:          2.60 cm     LV e' medial:    3.81 cm/s LV PW:         1.00 cm     LV E/e' medial:  22.1 LV IVS:        0.90 cm     LV e' lateral:   7.18 cm/s LVOT diam:     1.80 cm     LV E/e' lateral: 11.7 LV SV:         66 LV SV Index:   40 LVOT Area:     2.54 cm  LV Volumes (MOD) LV vol d, MOD A2C: 41.3 ml LV vol d, MOD A4C: 36.9 ml LV vol s, MOD A2C: 17.8 ml LV vol s, MOD A4C: 17.5 ml LV SV MOD A2C:     23.5 ml LV SV MOD A4C:     36.9 ml LV SV MOD BP:      21.5 ml RIGHT VENTRICLE RV S prime:     9.25 cm/s TAPSE (M-mode): 1.8 cm LEFT ATRIUM             Index        RIGHT ATRIUM          Index LA diam:        3.20 cm 1.94 cm/m   RA Area:     8.26 cm LA Vol (A2C):   23.6 ml 14.34 ml/m  RA Volume:   13.50 ml 8.20 ml/m LA Vol (A4C):   21.3 ml 12.94 ml/m LA Biplane Vol: 24.6 ml 14.94 ml/m  AORTIC VALVE LVOT Vmax:   118.00 cm/s LVOT Vmean:  80.800 cm/s LVOT VTI:    0.258 m  AORTA Ao Root diam: 3.10 cm Ao Asc diam:  2.90 cm MITRAL VALVE MV Area (PHT): 3.48 cm     SHUNTS MV Decel Time: 218 msec  Systemic VTI:  0.26 m MV E velocity: 84.30 cm/s   Systemic Diam: 1.80 cm MV A velocity: 114.00 cm/s MV E/A ratio:  0.74 Lennie Odor MD Electronically signed by Lennie Odor MD Signature Date/Time: 03/13/2023/3:56:17 PM    Final    CT Angio Chest Pulmonary Embolism (PE) W or WO Contrast Result Date: 03/12/2023 CLINICAL DATA:  chest pain, suspected pneumonia, rule out PE. EXAM: CT ANGIOGRAPHY CHEST WITH CONTRAST TECHNIQUE: Multidetector CT imaging of the chest was performed using the standard protocol during bolus administration of intravenous contrast. Multiplanar CT image reconstructions and MIPs were obtained to evaluate the vascular anatomy. RADIATION DOSE REDUCTION: This exam was performed according to the departmental dose-optimization program which includes automated exposure control, adjustment of the mA and/or kV according to patient size and/or use of iterative reconstruction technique. CONTRAST:  60mL OMNIPAQUE  IOHEXOL 350 MG/ML SOLN COMPARISON:  CT scan chest from 08/05/2021. FINDINGS: Cardiovascular: No evidence of embolism to the proximal subsegmental pulmonary artery level. Normal cardiac size. No pericardial effusion. No aortic aneurysm. There are coronary artery calcifications, in keeping with coronary artery disease. There are also mild peripheral atherosclerotic vascular calcifications of thoracic aorta and its major branches. Mediastinum/Nodes: Visualized lower neck is within normal limits. No solid / cystic mediastinal masses. The esophagus is nondistended precluding optimal assessment. No axillary, mediastinal or hilar lymphadenopathy by size criteria. Lungs/Pleura: The central tracheo-bronchial tree is patent. There are patchy ground-glass opacities throughout bilateral lungs, nonspecific in etiology. No mass or dense consolidation. No pleural effusion or pneumothorax. No suspicious lung nodules. Upper Abdomen: Surgically absent gallbladder. Remaining visualized upper abdominal viscera within normal limits. Musculoskeletal: The visualized soft tissues of the chest wall are grossly unremarkable. No suspicious osseous lesions. There are mild multilevel degenerative changes in the visualized spine. Right shoulder arthroplasty noted. Review of the MIP images confirms the above findings. IMPRESSION: 1. No embolism to the proximal subsegmental pulmonary artery level. 2. There are patchy ground-glass opacities throughout bilateral lungs, which is nonspecific in etiology. Differential diagnosis includes infection, pulmonary edema, ARDS, diffuse alveolar hemorrhage, etc. Correlate clinically. 3. Multiple other nonacute observations, as described above. Aortic Atherosclerosis (ICD10-I70.0). Electronically Signed   By: Jules Schick M.D.   On: 03/12/2023 15:29   CT ABDOMEN PELVIS W CONTRAST Result Date: 03/11/2023 CLINICAL DATA:  Abdominal pain, acute, nonlocalized Nausea, diarrhea, abdominal pain. Recent pneumonia  with antibiotics. EXAM: CT ABDOMEN AND PELVIS WITH CONTRAST TECHNIQUE: Multidetector CT imaging of the abdomen and pelvis was performed using the standard protocol following bolus administration of intravenous contrast. RADIATION DOSE REDUCTION: This exam was performed according to the departmental dose-optimization program which includes automated exposure control, adjustment of the mA and/or kV according to patient size and/or use of iterative reconstruction technique. CONTRAST:  75mL OMNIPAQUE IOHEXOL 300 MG/ML  SOLN COMPARISON:  Chest x-ray 03/11/2023, CT abdomen pelvis 08/13/2013, CT chest 08/05/2021 FINDINGS: Lower chest: Interval development of scattered peribronchovascular patchy airspace opacities. Bronchial wall thickening. Aortic valve calcification. Coronary artery calcification. Hepatobiliary: No focal liver abnormality. Status post cholecystectomy. No biliary dilatation. Pancreas: No focal lesion. Normal pancreatic contour. No surrounding inflammatory changes. No main pancreatic ductal dilatation. Spleen: Normal in size without focal abnormality. Adrenals/Urinary Tract: No adrenal nodule bilaterally. Bilateral kidneys enhance symmetrically. No hydronephrosis. No hydroureter. The urinary bladder is unremarkable. On delayed imaging, there is no urothelial wall thickening and there are no filling defects in the opacified portions of the bilateral collecting systems or ureters. Stomach/Bowel: Stomach is within normal limits. No evidence of bowel  wall thickening or dilatation. Status post appendectomy. Vascular/Lymphatic: No abdominal aorta or iliac aneurysm. Severe atherosclerotic plaque of the aorta and its branches. No abdominal, pelvic, or inguinal lymphadenopathy. Reproductive: Status post hysterectomy. No adnexal masses. Other: No intraperitoneal free fluid. No intraperitoneal free gas. No organized fluid collection. Musculoskeletal: Left lumbar hernia containing fat with an abdominal defect of 3 cm  (2:35). No suspicious lytic or blastic osseous lesions. No acute displaced fracture. Multilevel severe degenerative changes of the spine. L3-L5 interbody and posterolateral surgical hardware fusion. IMPRESSION: 1. Interval development of scattered peribronchovascular patchy airspace opacities. Findings suggestive of infection/inflammation. 2. Left lumbar hernia containing fat with an abdominal defect of 3 cm. 3. No acute intra-abdominal or intrapelvic abnormality. 4.  Aortic Atherosclerosis (ICD10-I70.0). Electronically Signed   By: Tish Frederickson M.D.   On: 03/11/2023 17:42   DG Chest Portable 1 View Result Date: 03/11/2023 CLINICAL DATA:  Cough, hypoxia, diarrhea for 3-4 days EXAM: PORTABLE CHEST 1 VIEW COMPARISON:  03/05/2023, 08/05/2021 FINDINGS: Single frontal view of the chest demonstrates a stable cardiac silhouette. Chronic bibasilar scarring, without acute airspace disease, effusion, or pneumothorax. No acute bony abnormalities. Right shoulder arthroplasty. IMPRESSION: 1. Chronic scarring.  No acute process. Electronically Signed   By: Sharlet Salina M.D.   On: 03/11/2023 16:03   DG Ankle Complete Left Result Date: 02/18/2023 CLINICAL DATA:  Recent fall with persistent left ankle pain and swelling. EXAM: LEFT ANKLE COMPLETE - 3+ VIEW COMPARISON:  None Available. FINDINGS: Soft tissue swelling about the lateral malleolus without associated fracture or dislocation. Joint spaces appear preserved. The ankle mortise appears preserved. No definite ankle joint effusion. Minimal enthesopathic change involving the medial malleolus, likely the sequela of remote avulsive injury. Small plantar calcaneal spur with dystrophic calcifications within the adjacent plantar fascia. Enthesopathic change involving the Achilles tendon insertion site. No radiopaque foreign body. IMPRESSION: 1. Soft tissue swelling about the lateral malleolus without associated fracture or dislocation. 2. Small plantar calcaneal spur with  dystrophic calcifications within the adjacent plantar fascia, nonspecific though could be seen in the setting of plantar fasciitis. Electronically Signed   By: Simonne Come M.D.   On: 02/18/2023 12:50    Microbiology: Results for orders placed or performed during the hospital encounter of 03/11/23  Resp panel by RT-PCR (RSV, Flu A&B, Covid) Anterior Nasal Swab     Status: None   Collection Time: 03/11/23  3:49 PM   Specimen: Anterior Nasal Swab  Result Value Ref Range Status   SARS Coronavirus 2 by RT PCR NEGATIVE NEGATIVE Final    Comment: (NOTE) SARS-CoV-2 target nucleic acids are NOT DETECTED.  The SARS-CoV-2 RNA is generally detectable in upper respiratory specimens during the acute phase of infection. The lowest concentration of SARS-CoV-2 viral copies this assay can detect is 138 copies/mL. A negative result does not preclude SARS-Cov-2 infection and should not be used as the sole basis for treatment or other patient management decisions. A negative result may occur with  improper specimen collection/handling, submission of specimen other than nasopharyngeal swab, presence of viral mutation(s) within the areas targeted by this assay, and inadequate number of viral copies(<138 copies/mL). A negative result must be combined with clinical observations, patient history, and epidemiological information. The expected result is Negative.  Fact Sheet for Patients:  BloggerCourse.com  Fact Sheet for Healthcare Providers:  SeriousBroker.it  This test is no t yet approved or cleared by the [place] FDA and  has been authorized for detection and/or diagnosis of SARS-CoV-2 by FDA  under an Emergency Use Authorization (EUA). This EUA will remain  in effect (meaning this test can be used) for the duration of the COVID-19 declaration under Section 564(b)(1) of the Act, 21 U.S.C.section 360bbb-3(b)(1), unless the authorization is  terminated  or revoked sooner.       Influenza A by PCR NEGATIVE NEGATIVE Final   Influenza B by PCR NEGATIVE NEGATIVE Final    Comment: (NOTE) The Xpert Xpress SARS-CoV-2/FLU/RSV plus assay is intended as an aid in the diagnosis of influenza from Nasopharyngeal swab specimens and should not be used as a sole basis for treatment. Nasal washings and aspirates are unacceptable for Xpert Xpress SARS-CoV-2/FLU/RSV testing.  Fact Sheet for Patients: BloggerCourse.com  Fact Sheet for Healthcare Providers: SeriousBroker.it  This test is not yet approved or cleared by the [place] FDA and has been authorized for detection and/or diagnosis of SARS-CoV-2 by FDA under an Emergency Use Authorization (EUA). This EUA will remain in effect (meaning this test can be used) for the duration of the COVID-19 declaration under Section 564(b)(1) of the Act, 21 U.S.C. section 360bbb-3(b)(1), unless the authorization is terminated or revoked.     Resp Syncytial Virus by PCR NEGATIVE NEGATIVE Final    Comment: (NOTE) Fact Sheet for Patients: BloggerCourse.com  Fact Sheet for Healthcare Providers: SeriousBroker.it  This test is not yet approved or cleared by the [place] FDA and has been authorized for detection and/or diagnosis of SARS-CoV-2 by FDA under an Emergency Use Authorization (EUA). This EUA will remain in effect (meaning this test can be used) for the duration of the COVID-19 declaration under Section 564(b)(1) of the Act, 21 U.S.C. section 360bbb-3(b)(1), unless the authorization is terminated or revoked.  Performed at [facility], [address], [place], [place] [address]   MRSA Next Gen by PCR, Nasal     Status: None   Collection Time: 03/12/23  8:27 AM   Specimen: Nasal Mucosa; Nasal Swab  Result Value Ref Range Status   MRSA by PCR Next Gen NOT  DETECTED NOT DETECTED Final    Comment: (NOTE) The GeneXpert MRSA Assay (FDA approved for NASAL specimens only), is one component of a comprehensive MRSA colonization surveillance program. It is not intended to diagnose MRSA infection nor to guide or monitor treatment for MRSA infections. Test performance is not FDA approved in patients less than 63 years old. Performed at [facility], [address], [place], [place] [address]   C Difficile Quick Screen w PCR reflex     Status: None   Collection Time: 03/13/23 11:33 AM   Specimen: STOOL  Result Value Ref Range Status   C Diff antigen NEGATIVE NEGATIVE Final   C Diff toxin NEGATIVE NEGATIVE Final   C Diff interpretation No C. difficile detected.  Final    Comment: Performed at [facility], [address] [place], [place] [address]  Gastrointestinal Panel by PCR , Stool     Status: None   Collection Time: 03/13/23 11:33 AM   Specimen: Stool  Result Value Ref Range Status   Campylobacter species NOT DETECTED NOT DETECTED Final   Plesimonas shigelloides NOT DETECTED NOT DETECTED Final   Salmonella species NOT DETECTED NOT DETECTED Final   Yersinia enterocolitica NOT DETECTED NOT DETECTED Final   Vibrio species NOT DETECTED NOT DETECTED Final   Vibrio cholerae NOT DETECTED NOT DETECTED Final   Enteroaggregative E coli (EAEC) NOT DETECTED NOT DETECTED Final   Enteropathogenic E coli (EPEC) NOT  DETECTED NOT DETECTED Final   Enterotoxigenic E coli (ETEC) NOT DETECTED NOT DETECTED Final   Shiga like toxin producing E coli (STEC) NOT DETECTED NOT DETECTED Final   Shigella/Enteroinvasive E coli (EIEC) NOT DETECTED NOT DETECTED Final   Cryptosporidium NOT DETECTED NOT DETECTED Final   Cyclospora cayetanensis NOT DETECTED NOT DETECTED Final   Entamoeba histolytica NOT DETECTED NOT DETECTED Final   Giardia lamblia NOT DETECTED NOT DETECTED Final   Adenovirus F40/41 NOT DETECTED NOT DETECTED Final    Astrovirus NOT DETECTED NOT DETECTED Final   Norovirus GI/GII NOT DETECTED NOT DETECTED Final   Rotavirus A NOT DETECTED NOT DETECTED Final   Sapovirus (I, II, IV, and V) NOT DETECTED NOT DETECTED Final    Comment: Performed at [facility], [address] [place], [place] [address]  Respiratory (~20 pathogens) panel by PCR     Status: None   Collection Time: 03/13/23 10:14 PM   Specimen: Nasopharyngeal Swab; Respiratory  Result Value Ref Range Status   Adenovirus NOT DETECTED NOT DETECTED Final   Coronavirus 229E NOT DETECTED NOT DETECTED Final    Comment: (NOTE) The Coronavirus on the Respiratory Panel, DOES NOT test for the novel  Coronavirus (2019 nCoV)    Coronavirus HKU1 NOT DETECTED NOT DETECTED Final   Coronavirus NL63 NOT DETECTED NOT DETECTED Final   Coronavirus OC43 NOT DETECTED NOT DETECTED Final   Metapneumovirus NOT DETECTED NOT DETECTED Final   Rhinovirus / Enterovirus NOT DETECTED NOT DETECTED Final   Influenza A NOT DETECTED NOT DETECTED Final   Influenza B NOT DETECTED NOT DETECTED Final   Parainfluenza Virus 1 NOT DETECTED NOT DETECTED Final   Parainfluenza Virus 2 NOT DETECTED NOT DETECTED Final   Parainfluenza Virus 3 NOT DETECTED NOT DETECTED Final   Parainfluenza Virus 4 NOT DETECTED NOT DETECTED Final   Respiratory Syncytial Virus NOT DETECTED NOT DETECTED Final   Bordetella pertussis NOT DETECTED NOT DETECTED Final   Bordetella Parapertussis NOT DETECTED NOT DETECTED Final   Chlamydophila pneumoniae NOT DETECTED NOT DETECTED Final   Mycoplasma pneumoniae NOT DETECTED NOT DETECTED Final    Comment: Performed at [facility], [address], [place], [place] [address]    Labs: CBC: Recent Labs  Lab 03/11/23 1544 03/12/23 0530 03/13/23 0603 03/14/23 0611  WBC 14.4* 9.0 8.7 7.7  NEUTROABS 11.5*  --  7.7 6.8  HGB 14.1 12.6 12.6 11.7*  HCT 40.3 35.7* 36.7 35.1*  MCV 88.6 89.5 90.6 92.1  PLT 298 257 259 267   Basic Metabolic  Panel: Recent Labs  Lab 03/11/23 1544 03/12/23 0530 03/13/23 0603 03/14/23 0611 03/15/23 0550  NA 130*  --  131* 132* 129*  K 3.5  --  3.4* 4.1 3.3*  CL 94*  --  99 101 96*  CO2 22  --  23 20* 23  GLUCOSE 110*  --  108* 138* 86  BUN 66*  --  35* 33* 31*  CREATININE 1.60* 1.35* 1.14* 1.21* 1.05*  CALCIUM 8.6*  --  7.9* 7.9* 7.9*  MG  --   --  1.2* 2.0  --   PHOS  --   --  2.6 1.9*  --    Liver Function Tests: Recent Labs  Lab 03/11/23 1544 03/13/23 0603 03/14/23 0611  AST 34 26 22  ALT 32 24 23  ALKPHOS 73 65 58  BILITOT 0.9 0.9 0.6  PROT 6.2* 5.9* 5.5*  ALBUMIN 3.2* 2.9* 2.9*   CBG: Recent Labs  Lab  03/14/23 1121 03/14/23 1620 03/14/23 2223 03/15/23 0734 03/15/23 1151  GLUCAP 123* 194* 138* 87 124*    Discharge time spent: greater than 30 minutes.  Signed: Meredeth Ide, MD [facility] 03/15/2023

## 2023-03-15 NOTE — Plan of Care (Signed)
  Problem: Fluid Volume: Goal: Ability to maintain a balanced intake and output will improve Outcome: Progressing   Problem: Metabolic: Goal: Ability to maintain appropriate glucose levels will improve Outcome: Progressing   Problem: Nutritional: Goal: Maintenance of adequate nutrition will improve Outcome: Progressing   Problem: Skin Integrity: Goal: Risk for impaired skin integrity will decrease Outcome: Progressing   Problem: Clinical Measurements: Goal: Ability to maintain clinical measurements within normal limits will improve Outcome: Progressing   Problem: Activity: Goal: Risk for activity intolerance will decrease Outcome: Progressing   Problem: Nutrition: Goal: Adequate nutrition will be maintained Outcome: Progressing   Problem: Pain Managment: Goal: General experience of comfort will improve and/or be controlled Outcome: Progressing   Problem: Activity: Goal: Ability to tolerate increased activity will improve Outcome: Progressing   Problem: Clinical Measurements: Goal: Ability to maintain a body temperature in the normal range will improve Outcome: Progressing   Problem: Respiratory: Goal: Ability to maintain adequate ventilation will improve Outcome: Progressing

## 2023-03-15 NOTE — Progress Notes (Signed)
Mobility Specialist - Progress Note   03/15/23 1418  Mobility  Activity Ambulated with assistance in hallway  Level of Assistance Modified independent, requires aide device or extra time  Assistive Device Front wheel walker  Distance Ambulated (ft) 160 ft  Activity Response Tolerated well  Mobility Referral Yes  Mobility visit 1 Mobility  Mobility Specialist Start Time (ACUTE ONLY) 1408  Mobility Specialist Stop Time (ACUTE ONLY) 1418  Mobility Specialist Time Calculation (min) (ACUTE ONLY) 10 min   Pt received in bed and agreeable to mobility. Distance limited d/t ankle pain. No complaints during session. Pt to recliner after session with all needs met.    [facility]

## 2023-03-19 DIAGNOSIS — S8265XA Nondisplaced fracture of lateral malleolus of left fibula, initial encounter for closed fracture: Secondary | ICD-10-CM | POA: Diagnosis not present

## 2023-03-20 DIAGNOSIS — M25572 Pain in left ankle and joints of left foot: Secondary | ICD-10-CM | POA: Diagnosis not present

## 2023-03-23 DIAGNOSIS — R7303 Prediabetes: Secondary | ICD-10-CM | POA: Diagnosis not present

## 2023-03-23 DIAGNOSIS — R899 Unspecified abnormal finding in specimens from other organs, systems and tissues: Secondary | ICD-10-CM | POA: Diagnosis not present

## 2023-03-23 LAB — LAB REPORT - SCANNED: A1c: 5.7

## 2023-03-24 DIAGNOSIS — Z7985 Long-term (current) use of injectable non-insulin antidiabetic drugs: Secondary | ICD-10-CM | POA: Diagnosis not present

## 2023-03-24 DIAGNOSIS — Z981 Arthrodesis status: Secondary | ICD-10-CM | POA: Diagnosis not present

## 2023-03-24 DIAGNOSIS — Z794 Long term (current) use of insulin: Secondary | ICD-10-CM | POA: Diagnosis not present

## 2023-03-24 DIAGNOSIS — E1140 Type 2 diabetes mellitus with diabetic neuropathy, unspecified: Secondary | ICD-10-CM | POA: Diagnosis not present

## 2023-03-24 DIAGNOSIS — S8262XD Displaced fracture of lateral malleolus of left fibula, subsequent encounter for closed fracture with routine healing: Secondary | ICD-10-CM | POA: Diagnosis not present

## 2023-03-24 DIAGNOSIS — I2510 Atherosclerotic heart disease of native coronary artery without angina pectoris: Secondary | ICD-10-CM | POA: Diagnosis not present

## 2023-03-24 DIAGNOSIS — E785 Hyperlipidemia, unspecified: Secondary | ICD-10-CM | POA: Diagnosis not present

## 2023-03-24 DIAGNOSIS — K219 Gastro-esophageal reflux disease without esophagitis: Secondary | ICD-10-CM | POA: Diagnosis not present

## 2023-03-24 DIAGNOSIS — I5032 Chronic diastolic (congestive) heart failure: Secondary | ICD-10-CM | POA: Diagnosis not present

## 2023-03-24 DIAGNOSIS — G8929 Other chronic pain: Secondary | ICD-10-CM | POA: Diagnosis not present

## 2023-03-24 DIAGNOSIS — I700 Atherosclerosis of aorta: Secondary | ICD-10-CM | POA: Diagnosis not present

## 2023-03-24 DIAGNOSIS — S8252XD Displaced fracture of medial malleolus of left tibia, subsequent encounter for closed fracture with routine healing: Secondary | ICD-10-CM | POA: Diagnosis not present

## 2023-03-24 DIAGNOSIS — Z96611 Presence of right artificial shoulder joint: Secondary | ICD-10-CM | POA: Diagnosis not present

## 2023-03-24 DIAGNOSIS — R296 Repeated falls: Secondary | ICD-10-CM | POA: Diagnosis not present

## 2023-03-24 DIAGNOSIS — N183 Chronic kidney disease, stage 3 unspecified: Secondary | ICD-10-CM | POA: Diagnosis not present

## 2023-03-24 DIAGNOSIS — E039 Hypothyroidism, unspecified: Secondary | ICD-10-CM | POA: Diagnosis not present

## 2023-03-24 DIAGNOSIS — I130 Hypertensive heart and chronic kidney disease with heart failure and stage 1 through stage 4 chronic kidney disease, or unspecified chronic kidney disease: Secondary | ICD-10-CM | POA: Diagnosis not present

## 2023-03-24 DIAGNOSIS — E1122 Type 2 diabetes mellitus with diabetic chronic kidney disease: Secondary | ICD-10-CM | POA: Diagnosis not present

## 2023-03-24 DIAGNOSIS — I252 Old myocardial infarction: Secondary | ICD-10-CM | POA: Diagnosis not present

## 2023-03-24 DIAGNOSIS — E1151 Type 2 diabetes mellitus with diabetic peripheral angiopathy without gangrene: Secondary | ICD-10-CM | POA: Diagnosis not present

## 2023-03-24 DIAGNOSIS — Z9181 History of falling: Secondary | ICD-10-CM | POA: Diagnosis not present

## 2023-04-03 DIAGNOSIS — K219 Gastro-esophageal reflux disease without esophagitis: Secondary | ICD-10-CM | POA: Diagnosis not present

## 2023-04-03 DIAGNOSIS — E039 Hypothyroidism, unspecified: Secondary | ICD-10-CM | POA: Diagnosis not present

## 2023-04-03 DIAGNOSIS — I2510 Atherosclerotic heart disease of native coronary artery without angina pectoris: Secondary | ICD-10-CM | POA: Diagnosis not present

## 2023-04-03 DIAGNOSIS — G8929 Other chronic pain: Secondary | ICD-10-CM | POA: Diagnosis not present

## 2023-04-03 DIAGNOSIS — Z981 Arthrodesis status: Secondary | ICD-10-CM | POA: Diagnosis not present

## 2023-04-03 DIAGNOSIS — I700 Atherosclerosis of aorta: Secondary | ICD-10-CM | POA: Diagnosis not present

## 2023-04-03 DIAGNOSIS — E1151 Type 2 diabetes mellitus with diabetic peripheral angiopathy without gangrene: Secondary | ICD-10-CM | POA: Diagnosis not present

## 2023-04-03 DIAGNOSIS — E1122 Type 2 diabetes mellitus with diabetic chronic kidney disease: Secondary | ICD-10-CM | POA: Diagnosis not present

## 2023-04-03 DIAGNOSIS — I252 Old myocardial infarction: Secondary | ICD-10-CM | POA: Diagnosis not present

## 2023-04-03 DIAGNOSIS — Z9181 History of falling: Secondary | ICD-10-CM | POA: Diagnosis not present

## 2023-04-03 DIAGNOSIS — E1140 Type 2 diabetes mellitus with diabetic neuropathy, unspecified: Secondary | ICD-10-CM | POA: Diagnosis not present

## 2023-04-03 DIAGNOSIS — I130 Hypertensive heart and chronic kidney disease with heart failure and stage 1 through stage 4 chronic kidney disease, or unspecified chronic kidney disease: Secondary | ICD-10-CM | POA: Diagnosis not present

## 2023-04-03 DIAGNOSIS — Z7985 Long-term (current) use of injectable non-insulin antidiabetic drugs: Secondary | ICD-10-CM | POA: Diagnosis not present

## 2023-04-03 DIAGNOSIS — E785 Hyperlipidemia, unspecified: Secondary | ICD-10-CM | POA: Diagnosis not present

## 2023-04-03 DIAGNOSIS — I5032 Chronic diastolic (congestive) heart failure: Secondary | ICD-10-CM | POA: Diagnosis not present

## 2023-04-03 DIAGNOSIS — N183 Chronic kidney disease, stage 3 unspecified: Secondary | ICD-10-CM | POA: Diagnosis not present

## 2023-04-03 DIAGNOSIS — Z794 Long term (current) use of insulin: Secondary | ICD-10-CM | POA: Diagnosis not present

## 2023-04-03 DIAGNOSIS — S8262XD Displaced fracture of lateral malleolus of left fibula, subsequent encounter for closed fracture with routine healing: Secondary | ICD-10-CM | POA: Diagnosis not present

## 2023-04-03 DIAGNOSIS — R296 Repeated falls: Secondary | ICD-10-CM | POA: Diagnosis not present

## 2023-04-03 DIAGNOSIS — Z96611 Presence of right artificial shoulder joint: Secondary | ICD-10-CM | POA: Diagnosis not present

## 2023-04-04 NOTE — Progress Notes (Unsigned)
 Cardiology Office Note:  .   Date:  04/05/2023  ID:  Newton Pigg, DOB 1945-06-19, MRN [id number] PCP: Daisy Floro, MD  [facility] Providers Cardiologist:  Tonny Bollman, MD {  History of Present Illness: .   Gabrielle [name] is a 78 y.o. female with a past medical history of CAD, anemia, arthritis, GERD, HLD, hypothyroidism, HTN, peripheral neuropathy, pre-DM, pulmonary nodules (followed by pulmonology), CKD stage III AA who presents for follow-up.  Initially presented 2017 with an inferolateral STEMI found to have an occlusion of the first OM branch of the circumflex, treated with primary PCI.  Then underwent staged stenting of the RCA.  2D echo at that time showed EF 55 to 60%, normal diastolic function, no major abnormalities.  Was last seen by Dr. Excell Seltzer 08/07/2022 and at that time she reported dyspnea with exertion and attributed this to her weight.  She does not understand why her weight is increasing stating she has not changed her diet and she exercises more than she did in the past.  Has some limitation from neuropathy and had been evaluated by Dr. Lucia Gaskins.  She cut back on alcohol but was drinking 3 glasses of wine per night.  Denies chest pain/pressure.  No edema, orthopnea, or PND.  Today, she presents with a history of hypertension with a recent history of a fractured ankle. The patient reports the fracture occurred when she fell out of bed. The patient was initially evaluated and the fracture was not detected on initial x-ray. However, a month later, due to increased pain, another x-ray was performed which showed a fracture. The patient is currently wearing a boot and has about three weeks left of wearing it.  She has also been experiencing lower blood pressure readings, which she attributes to significant weight loss of forty pounds. The patient has been monitoring her blood pressure at home and has been adjusting her medication, benazepril, based on the readings. The  patient reports that if her blood pressure is under 100, she does not take the benazepril.  The patient also reports a recent hospitalization for pneumonia. During this hospitalization, the patient was switched from HTCZ to Lasix due to fluid in the lungs. The patient also reports that an echo was performed which showed fluid around the heart. Echo reviewed with the patient with no pericardial effusion noted.   The patient is currently in the process of moving and has been less active due to the fractured ankle. The patient reports that she was previously doing yoga almost every day and going to the gym.  Reports no shortness of breath nor dyspnea on exertion. Reports no chest pain, pressure, or tightness. No edema, orthopnea, PND. Reports no palpitations.   Discussed the use of AI scribe software for clinical note transcription with the patient, who gave verbal consent to proceed.  ROS: pertinent ROS in HPI  Studies Reviewed: Marland Kitchen        CARDIAC CATHETERIZATION   CARDIAC CATHETERIZATION 08/21/2015   Narrative  Mid RCA lesion, 85% stenosed.  Ost LM lesion, 40% stenosed.  Prox LAD to Mid LAD lesion, 25% stenosed.  Ost Cx to Prox Cx lesion, 70% stenosed.  There is mild left ventricular systolic dysfunction.  1st Mrg lesion, 100% stenosed. Post intervention, there is a 0% residual stenosis.   1. Acute inferolateral STEMI secondary to total occlusion of the first OM branch. Is is treated successfully with primary PCI.   2. Mild nonobstructive disease of the left main  and LAD   3. Severe stenosis of the mid RCA: Recommend staged PCI prior to discharge   4. Mild segmental contraction abnormality the left ventricle with preserved overall LVEF   Recommendations: Aspirin and brilinta 12 months, aggressive medical therapy, staged PCI the right coronary artery on Monday.   Findings Coronary Findings Diagnostic  Dominance: Right   Left Main Calcified eccentric. The proximal left main  is widely patent. The distal left main has eccentric 40% stenosis present.   Left Anterior Descending Calcified diffuse. The LAD has no obstructive disease. The vessel wraps around the LV apex. The diagonal branches are patent. There is diffuse LAD calcification.   Left Circumflex The circumflex is heavily calcified and diffusely diseased. The proximal circumflex has 60-70% stenosis. The mid circumflex has 50% stenosis. This extends into the first OM branch which is totally occluded. The AV circumflex beyond the OM has no significant obstruction.   First Obtuse Marginal Branch With heavy thrombus. The first OM is totally occluded. This is the patient&#39;s infarct vessel. There is heavy thrombus.   Right Coronary Artery There is a tight stenosis in the mid RCA. This is relatively focal with mild irregularity throughout the remaining parts of the vessel. There is mild RCA calcification.   Intervention   1st Mrg lesion PCI There is no pre-interventional antegrade distal flow (TIMI 0). Pre-stent angioplasty was performed. A drug-eluting stent was placed. Post-stent angioplasty was performed. Maximum pressure: 14 atm. The post-interventional distal flow is normal (TIMI 3). The intervention was successful. No complications occurred at this lesion. A cougar guidewire is used to cross the lesion. The lesion is predilated with a 2.5 mm balloon. The circumflex is diffusely diseased with moderate to heavy calcification. The lesion is treated focally with a 2.5 x 16 mm Promus DES. The stent is deployed at 12 atm and postdilated with a 2.75 mm noncompliant balloon to 14 atm. The patient tolerated the procedure well. She was loaded with brilinta 180 mg on the table.  The proximal circumflex has moderate diffuse disease all the way back to the ostium. This would require a long overlapping stent in a diffusely calcified vessel. I felt it was best to treat this area medically. There is a 0% residual stenosis post  intervention.     CARDIAC CATHETERIZATION   CARDIAC CATHETERIZATION 08/23/2015   Narrative Images from the original result were not included.    Mid RCA lesion, 85 %stenosed.  A STENT PROMUS PREM MR 2.5X16 drug eluting stent was successfully placed. Post intervention, there is a 0% residual stenosis.  1st Mrg stent appears to be widely patent  Ost Cx to Prox Cx lesion, 70 %stenosed.     Successful focal PCI of mid RCA lesion with DES stent. Reviewed with Dr. Excell Seltzer, we both feel that the osteo-proximal circumflex lesion is a very difficult lesion to consider PCI as there is really minimal landing zone for stent.  This also somewhat calcified and likely chronic. The patient had not been having anginal symptoms prior to coming in, therefore it is unlikely to be flow-limiting. Plan for now is medical management.     Plan: Transfer to 6 central post procedure unit for TR band removal. Continue dual antiplatelet therapy. Continue aggressive risk factor modification.   Expected discharge tomorrow. Will need follow-up with Dr. Otila Back, M.D., M.S. Interventional Cardiologist   Pager # [phone] Phone # [phone] [address] [place], [place] [address]  Findings Coronary Findings Diagnostic  Dominance: Right   Left Main The lesion is eccentric. The lesion is calcified. The proximal left main is widely patent. The distal left main has eccentric 40% stenosis present.   Left Anterior Descending The lesion is segmental. The lesion is calcified. The LAD has no obstructive disease. The vessel wraps around the LV apex. The diagonal branches are patent. There is diffuse LAD calcification.   Left Circumflex The lesion is moderately calcified. Unlikely to be flow limiting The circumflex is heavily calcified and diffusely diseased. The proximal circumflex has 60-70% stenosis. The mid circumflex has 50% stenosis. This extends into the first OM branch  which is totally occluded. The AV circumflex beyond the OM has no significant obstruction.   First Obtuse Marginal Branch Previously placed 1st Mrg drug eluting stent is widely patent. Widely patent stent The first OM is totally occluded. This is the patient&#39;s infarct vessel. There is heavy thrombus.   Right Coronary Artery There is a tight stenosis in the mid RCA. This is relatively focal with mild irregularity throughout the remaining parts of the vessel. There is mild RCA calcification.   Acute Marginal Branch Vessel is small in size.   Right Posterior Descending Artery Vessel is small in size.   First Right Posterolateral Branch Vessel is small in size.   Second Right Posterolateral Branch Vessel is small in size.   Intervention   Mid RCA lesion Angioplasty Lesion length: 14 mm. Lesion crossed with guidewire. Pre-stent angioplasty was performed using a BALLOON EMERGE MR [id number]. Maximum pressure: 8 atm. Inflation time: 30 sec. A STENT PROMUS PREM MR 2.5X16 drug eluting stent was successfully placed. Minimum lumen area: 2.8 mm. Stent strut is well apposed. Post-stent angioplasty was performed using a STENT PROMUS PREM MR 2.5X16. Maximum pressure: 18 atm. Inflation time: 30 sec. The pre-interventional distal flow is normal (TIMI 3).  The post-interventional distal flow is normal (TIMI 3). The intervention was successful . No complications occurred at this lesion. CATH VISTA GUIDE 6FR JR4 ([id number]) -- WIRE HI TORQ BMW 190CM ([id number] HC) There is a 0% residual stenosis post intervention.     ECHOCARDIOGRAM   ECHOCARDIOGRAM COMPLETE 02/15/2021   Narrative ECHOCARDIOGRAM REPORT       Patient Name:   KELDA AZAD Date of Exam: 02/15/2021 Medical Rec #:  [id number]      Height:       60.0 in Accession #:    [id number]     Weight:       183.2 lb Date of Birth:  05/08/1945     BSA:          1.798 m Patient Age:    75 years       BP:           137/93 mmHg Patient Gender: F               HR:           86 bpm. Exam Location:  [address]   Procedure: 2D Echo, Cardiac Doppler, Color Doppler and Strain Analysis   Indications:    R06.00 Dyspnea   History:        Patient has prior history of Echocardiogram examinations, most recent 08/23/2015. CAD, CKD; Risk Factors:Hypertension and HLD.   Sonographer:    Clearence Ped RCS Referring Phys: 22 DAYNA N DUNN   IMPRESSIONS     1. Left ventricular ejection fraction, by estimation, is 60 to 65%. The left ventricle has normal function.  The left ventricle has no regional wall motion abnormalities. Left ventricular diastolic parameters are consistent with Grade I diastolic dysfunction (impaired relaxation). 2. Right ventricular systolic function is normal. The right ventricular size is normal. There is normal pulmonary artery systolic pressure. 3. The mitral valve is normal in structure. No evidence of mitral valve regurgitation. No evidence of mitral stenosis. Moderate mitral annular calcification. 4. The aortic valve is tricuspid. There is mild calcification of the aortic valve. There is mild thickening of the aortic valve. Aortic valve regurgitation is not visualized. Aortic valve sclerosis is present, with no evidence of aortic valve stenosis. 5. The inferior vena cava is normal in size with greater than 50% respiratory variability, suggesting right atrial pressure of 3 mmHg.   Comparison(s): Prior images unable to be directly viewed, comparison made by report only.   FINDINGS Left Ventricle: Left ventricular ejection fraction, by estimation, is 60 to 65%. The left ventricle has normal function. The left ventricle has no regional wall motion abnormalities. The global longitudinal strain is normal despite suboptimal segment tracking. The left ventricular internal cavity size was normal in size. There is no left ventricular hypertrophy. Left ventricular diastolic parameters are consistent with Grade I diastolic dysfunction  (impaired relaxation). Normal left ventricular filling pressure.   Right Ventricle: The right ventricular size is normal. No increase in right ventricular wall thickness. Right ventricular systolic function is normal. There is normal pulmonary artery systolic pressure. The tricuspid regurgitant velocity is 2.45 m/s, and with an assumed right atrial pressure of 3 mmHg, the estimated right ventricular systolic pressure is 27.0 mmHg.   Left Atrium: Left atrial size was normal in size.   Right Atrium: Right atrial size was normal in size.   Pericardium: There is no evidence of pericardial effusion.   Mitral Valve: The mitral valve is normal in structure. Moderate mitral annular calcification. No evidence of mitral valve regurgitation. No evidence of mitral valve stenosis.   Tricuspid Valve: The tricuspid valve is normal in structure. Tricuspid valve regurgitation is trivial. No evidence of tricuspid stenosis.   Aortic Valve: The aortic valve is tricuspid. There is mild calcification of the aortic valve. There is mild thickening of the aortic valve. Aortic valve regurgitation is not visualized. Aortic valve sclerosis is present, with no evidence of aortic valve stenosis.   Pulmonic Valve: The pulmonic valve was normal in structure. Pulmonic valve regurgitation is not visualized. No evidence of pulmonic stenosis.   Aorta: The aortic root is normal in size and structure.   Venous: The inferior vena cava is normal in size with greater than 50% respiratory variability, suggesting right atrial pressure of 3 mmHg.   IAS/Shunts: No atrial level shunt detected by color flow Doppler.     LEFT VENTRICLE PLAX 2D LVIDd:         3.00 cm   Diastology LVIDs:         2.00 cm   LV e' medial:    7.00 cm/s LV PW:         0.60 cm   LV E/e' medial:  9.9 LV IVS:        0.60 cm   LV e' lateral:   9.03 cm/s LVOT diam:     1.90 cm   LV E/e' lateral: 7.7 LV SV:         71 LV SV Index:   40        2D  Longitudinal Strain LVOT Area:     2.84 cm  2D  Strain GLS (A2C):   -12.8 % 2D Strain GLS (A3C):   -21.0 % 2D Strain GLS (A4C):   -15.3 % 2D Strain GLS Avg:     -16.4 %   RIGHT VENTRICLE RV Basal diam:  2.40 cm RV S prime:     13.20 cm/s TAPSE (M-mode): 2.0 cm RVSP:           27.0 mmHg   LEFT ATRIUM             Index        RIGHT ATRIUM           Index LA diam:        3.70 cm 2.06 cm/m   RA Pressure: 3.00 mmHg LA Vol (A2C):   36.4 ml 20.24 ml/m  RA Area:     7.92 cm LA Vol (A4C):   38.4 ml 21.35 ml/m  RA Volume:   12.50 ml  6.95 ml/m LA Biplane Vol: 38.0 ml 21.13 ml/m AORTIC VALVE LVOT Vmax:   124.00 cm/s LVOT Vmean:  92.000 cm/s LVOT VTI:    0.252 m   AORTA Ao Root diam: 3.10 cm Ao Asc diam:  3.20 cm   MITRAL VALVE                TRICUSPID VALVE MV Area (PHT):              TR Peak grad:   24.0 mmHg MV Decel Time:              TR Vmax:        245.00 cm/s MV E velocity: 69.20 cm/s   Estimated RAP:  3.00 mmHg MV A velocity: 123.00 cm/s  RVSP:           27.0 mmHg MV E/A ratio:  0.56 SHUNTS Systemic VTI:  0.25 m Systemic Diam: 1.90 cm   Mihai Croitoru MD Electronically signed by Thurmon Fair MD Signature Date/Time: 02/15/2021/2:49:42 PM       Final      Physical Exam:   VS:  BP 98/60   Pulse 84   Ht 4' 11.5" (1.511 m)   Wt 152 lb (68.9 kg)   SpO2 93%   BMI 30.19 kg/m    Wt Readings from Last 3 Encounters:  04/05/23 152 lb (68.9 kg)  03/15/23 169 lb 15.6 oz (77.1 kg)  09/04/22 194 lb (88 kg)    GEN: Well nourished, well developed in no acute distress NECK: No JVD; No carotid bruits CARDIAC: RRR, no murmurs, rubs, gallops RESPIRATORY:  Clear to auscultation without rales, wheezing or rhonchi  ABDOMEN: Soft, non-tender, non-distended EXTREMITIES:  No edema; No deformity   ASSESSMENT AND PLAN: .   CAD -no recent chest pain or SOB -continue current medications, refills provided today  Ankle Fracture Recent fracture of the ankle due to a  fall. -Continue with current management and follow-up with orthopedics on May 01, 2023.  Weight Loss Significant weight loss of 40 pounds achieved through dietary changes and medication (Mounjaro). -Continue current weight loss strategies. -She is very thankful to Margaretmary Dys, PharmD for her assistance  Hypertension Blood pressure on the lower side of normal, possibly due to weight loss. Patient self-adjusts Benazepril dose based on home blood pressure readings. -Reduce Benazepril to 10mg  BID. -Continue home blood pressure monitoring and skip dose if systolic blood pressure is under 100.  Edema Noted mild edema in the non-fractured leg. -Continue Lasix 20mg  daily. -Consider use of compression stockings on days with  increased activity. -keep elevated when sitting  General Health Maintenance / Followup Plans -Transition care to a new provider in [place], preferably affiliated with [facility]. -Continue current medications and lifestyle modifications. -Follow-up after relocation to [place].  Mixed hyperlipidemia -improvement with Nexlizet -continue current medication regimen     Dispo: She plans to move to [place] in two weeks and we  recommended [facility]  Signed, Sharlene Dory, PA-C

## 2023-04-05 ENCOUNTER — Encounter: Payer: Self-pay | Admitting: Physician Assistant

## 2023-04-05 ENCOUNTER — Ambulatory Visit: Payer: Medicare Other | Attending: Physician Assistant | Admitting: Physician Assistant

## 2023-04-05 VITALS — BP 98/60 | HR 84 | Ht 59.5 in | Wt 152.0 lb

## 2023-04-05 DIAGNOSIS — I10 Essential (primary) hypertension: Secondary | ICD-10-CM

## 2023-04-05 DIAGNOSIS — E119 Type 2 diabetes mellitus without complications: Secondary | ICD-10-CM

## 2023-04-05 DIAGNOSIS — E66812 Obesity, class 2: Secondary | ICD-10-CM | POA: Diagnosis not present

## 2023-04-05 DIAGNOSIS — E782 Mixed hyperlipidemia: Secondary | ICD-10-CM | POA: Diagnosis not present

## 2023-04-05 DIAGNOSIS — N1831 Chronic kidney disease, stage 3a: Secondary | ICD-10-CM

## 2023-04-05 DIAGNOSIS — I2510 Atherosclerotic heart disease of native coronary artery without angina pectoris: Secondary | ICD-10-CM

## 2023-04-05 MED ORDER — BENAZEPRIL HCL 10 MG PO TABS: 10.0000 mg | ORAL_TABLET | Freq: Two times a day (BID) | ORAL | 3 refills | Status: AC

## 2023-04-05 NOTE — Patient Instructions (Signed)
 Medication Instructions:   START TAKING : BENAZEPRIL 10 MG TWICE A DAY   *If you need a refill on your cardiac medications before your next appointment, please call your pharmacy*    Lab Work: NONE ORDERED  TODAY    If you have labs (blood work) drawn today and your tests are completely normal, you will receive your results only by: MyChart Message (if you have MyChart) OR A paper copy in the mail If you have any lab test that is abnormal or we need to change your treatment, we will call you to review the results.   Testing/Procedures: NONE ORDERED  TODAY     Follow-Up: At [facility], you and your health needs are our priority.  As part of our continuing mission to provide you with exceptional heart care, we have created designated Provider Care Teams.  These Care Teams include your primary Cardiologist (physician) and Advanced Practice Providers (APPs -  Physician Assistants and Nurse Practitioners) who all work together to provide you with the care you need, when you need it.  We recommend signing up for the patient portal called "MyChart".  Sign up information is provided on this After Visit Summary.  MyChart is used to connect with patients for Virtual Visits (Telemedicine).  Patients are able to view lab/test results, encounter notes, upcoming appointments, etc.  Non-urgent messages can be sent to your provider as well.   To learn more about what you can do with MyChart, go to [email or web address].    Your next appointment:  WILL BE WITH YOUR NEW CARDIOLOGIST IN [place]

## 2023-04-10 DIAGNOSIS — E1151 Type 2 diabetes mellitus with diabetic peripheral angiopathy without gangrene: Secondary | ICD-10-CM | POA: Diagnosis not present

## 2023-04-10 DIAGNOSIS — I700 Atherosclerosis of aorta: Secondary | ICD-10-CM | POA: Diagnosis not present

## 2023-04-10 DIAGNOSIS — Z96611 Presence of right artificial shoulder joint: Secondary | ICD-10-CM | POA: Diagnosis not present

## 2023-04-10 DIAGNOSIS — Z794 Long term (current) use of insulin: Secondary | ICD-10-CM | POA: Diagnosis not present

## 2023-04-10 DIAGNOSIS — E1140 Type 2 diabetes mellitus with diabetic neuropathy, unspecified: Secondary | ICD-10-CM | POA: Diagnosis not present

## 2023-04-10 DIAGNOSIS — G8929 Other chronic pain: Secondary | ICD-10-CM | POA: Diagnosis not present

## 2023-04-10 DIAGNOSIS — E039 Hypothyroidism, unspecified: Secondary | ICD-10-CM | POA: Diagnosis not present

## 2023-04-10 DIAGNOSIS — Z7985 Long-term (current) use of injectable non-insulin antidiabetic drugs: Secondary | ICD-10-CM | POA: Diagnosis not present

## 2023-04-10 DIAGNOSIS — I5032 Chronic diastolic (congestive) heart failure: Secondary | ICD-10-CM | POA: Diagnosis not present

## 2023-04-10 DIAGNOSIS — R296 Repeated falls: Secondary | ICD-10-CM | POA: Diagnosis not present

## 2023-04-10 DIAGNOSIS — S8262XD Displaced fracture of lateral malleolus of left fibula, subsequent encounter for closed fracture with routine healing: Secondary | ICD-10-CM | POA: Diagnosis not present

## 2023-04-10 DIAGNOSIS — E785 Hyperlipidemia, unspecified: Secondary | ICD-10-CM | POA: Diagnosis not present

## 2023-04-10 DIAGNOSIS — E1122 Type 2 diabetes mellitus with diabetic chronic kidney disease: Secondary | ICD-10-CM | POA: Diagnosis not present

## 2023-04-10 DIAGNOSIS — N183 Chronic kidney disease, stage 3 unspecified: Secondary | ICD-10-CM | POA: Diagnosis not present

## 2023-04-10 DIAGNOSIS — I130 Hypertensive heart and chronic kidney disease with heart failure and stage 1 through stage 4 chronic kidney disease, or unspecified chronic kidney disease: Secondary | ICD-10-CM | POA: Diagnosis not present

## 2023-04-10 DIAGNOSIS — Z9181 History of falling: Secondary | ICD-10-CM | POA: Diagnosis not present

## 2023-04-10 DIAGNOSIS — Z981 Arthrodesis status: Secondary | ICD-10-CM | POA: Diagnosis not present

## 2023-04-10 DIAGNOSIS — I252 Old myocardial infarction: Secondary | ICD-10-CM | POA: Diagnosis not present

## 2023-04-10 DIAGNOSIS — I2510 Atherosclerotic heart disease of native coronary artery without angina pectoris: Secondary | ICD-10-CM | POA: Diagnosis not present

## 2023-04-10 DIAGNOSIS — K219 Gastro-esophageal reflux disease without esophagitis: Secondary | ICD-10-CM | POA: Diagnosis not present

## 2023-04-20 DIAGNOSIS — E1122 Type 2 diabetes mellitus with diabetic chronic kidney disease: Secondary | ICD-10-CM | POA: Diagnosis not present

## 2023-04-20 DIAGNOSIS — R296 Repeated falls: Secondary | ICD-10-CM | POA: Diagnosis not present

## 2023-04-20 DIAGNOSIS — Z794 Long term (current) use of insulin: Secondary | ICD-10-CM | POA: Diagnosis not present

## 2023-04-20 DIAGNOSIS — K219 Gastro-esophageal reflux disease without esophagitis: Secondary | ICD-10-CM | POA: Diagnosis not present

## 2023-04-20 DIAGNOSIS — I130 Hypertensive heart and chronic kidney disease with heart failure and stage 1 through stage 4 chronic kidney disease, or unspecified chronic kidney disease: Secondary | ICD-10-CM | POA: Diagnosis not present

## 2023-04-20 DIAGNOSIS — Z96611 Presence of right artificial shoulder joint: Secondary | ICD-10-CM | POA: Diagnosis not present

## 2023-04-20 DIAGNOSIS — E1151 Type 2 diabetes mellitus with diabetic peripheral angiopathy without gangrene: Secondary | ICD-10-CM | POA: Diagnosis not present

## 2023-04-20 DIAGNOSIS — Z7985 Long-term (current) use of injectable non-insulin antidiabetic drugs: Secondary | ICD-10-CM | POA: Diagnosis not present

## 2023-04-20 DIAGNOSIS — E039 Hypothyroidism, unspecified: Secondary | ICD-10-CM | POA: Diagnosis not present

## 2023-04-20 DIAGNOSIS — I5032 Chronic diastolic (congestive) heart failure: Secondary | ICD-10-CM | POA: Diagnosis not present

## 2023-04-20 DIAGNOSIS — E1140 Type 2 diabetes mellitus with diabetic neuropathy, unspecified: Secondary | ICD-10-CM | POA: Diagnosis not present

## 2023-04-20 DIAGNOSIS — G8929 Other chronic pain: Secondary | ICD-10-CM | POA: Diagnosis not present

## 2023-04-20 DIAGNOSIS — S8262XD Displaced fracture of lateral malleolus of left fibula, subsequent encounter for closed fracture with routine healing: Secondary | ICD-10-CM | POA: Diagnosis not present

## 2023-04-20 DIAGNOSIS — Z9181 History of falling: Secondary | ICD-10-CM | POA: Diagnosis not present

## 2023-04-20 DIAGNOSIS — I2510 Atherosclerotic heart disease of native coronary artery without angina pectoris: Secondary | ICD-10-CM | POA: Diagnosis not present

## 2023-04-20 DIAGNOSIS — I700 Atherosclerosis of aorta: Secondary | ICD-10-CM | POA: Diagnosis not present

## 2023-04-20 DIAGNOSIS — E785 Hyperlipidemia, unspecified: Secondary | ICD-10-CM | POA: Diagnosis not present

## 2023-04-20 DIAGNOSIS — Z981 Arthrodesis status: Secondary | ICD-10-CM | POA: Diagnosis not present

## 2023-04-20 DIAGNOSIS — I252 Old myocardial infarction: Secondary | ICD-10-CM | POA: Diagnosis not present

## 2023-04-20 DIAGNOSIS — N183 Chronic kidney disease, stage 3 unspecified: Secondary | ICD-10-CM | POA: Diagnosis not present

## 2023-04-24 DIAGNOSIS — G8929 Other chronic pain: Secondary | ICD-10-CM | POA: Diagnosis not present

## 2023-04-24 DIAGNOSIS — E1122 Type 2 diabetes mellitus with diabetic chronic kidney disease: Secondary | ICD-10-CM | POA: Diagnosis not present

## 2023-04-24 DIAGNOSIS — Z7985 Long-term (current) use of injectable non-insulin antidiabetic drugs: Secondary | ICD-10-CM | POA: Diagnosis not present

## 2023-04-24 DIAGNOSIS — N183 Chronic kidney disease, stage 3 unspecified: Secondary | ICD-10-CM | POA: Diagnosis not present

## 2023-04-24 DIAGNOSIS — S8262XD Displaced fracture of lateral malleolus of left fibula, subsequent encounter for closed fracture with routine healing: Secondary | ICD-10-CM | POA: Diagnosis not present

## 2023-04-24 DIAGNOSIS — Z794 Long term (current) use of insulin: Secondary | ICD-10-CM | POA: Diagnosis not present

## 2023-04-24 DIAGNOSIS — E1140 Type 2 diabetes mellitus with diabetic neuropathy, unspecified: Secondary | ICD-10-CM | POA: Diagnosis not present

## 2023-04-24 DIAGNOSIS — I700 Atherosclerosis of aorta: Secondary | ICD-10-CM | POA: Diagnosis not present

## 2023-04-24 DIAGNOSIS — I252 Old myocardial infarction: Secondary | ICD-10-CM | POA: Diagnosis not present

## 2023-04-24 DIAGNOSIS — I5032 Chronic diastolic (congestive) heart failure: Secondary | ICD-10-CM | POA: Diagnosis not present

## 2023-04-24 DIAGNOSIS — I130 Hypertensive heart and chronic kidney disease with heart failure and stage 1 through stage 4 chronic kidney disease, or unspecified chronic kidney disease: Secondary | ICD-10-CM | POA: Diagnosis not present

## 2023-04-24 DIAGNOSIS — K219 Gastro-esophageal reflux disease without esophagitis: Secondary | ICD-10-CM | POA: Diagnosis not present

## 2023-04-24 DIAGNOSIS — E039 Hypothyroidism, unspecified: Secondary | ICD-10-CM | POA: Diagnosis not present

## 2023-04-24 DIAGNOSIS — E785 Hyperlipidemia, unspecified: Secondary | ICD-10-CM | POA: Diagnosis not present

## 2023-04-24 DIAGNOSIS — E1151 Type 2 diabetes mellitus with diabetic peripheral angiopathy without gangrene: Secondary | ICD-10-CM | POA: Diagnosis not present

## 2023-04-24 DIAGNOSIS — Z9181 History of falling: Secondary | ICD-10-CM | POA: Diagnosis not present

## 2023-04-24 DIAGNOSIS — Z96611 Presence of right artificial shoulder joint: Secondary | ICD-10-CM | POA: Diagnosis not present

## 2023-04-24 DIAGNOSIS — I2510 Atherosclerotic heart disease of native coronary artery without angina pectoris: Secondary | ICD-10-CM | POA: Diagnosis not present

## 2023-04-24 DIAGNOSIS — R296 Repeated falls: Secondary | ICD-10-CM | POA: Diagnosis not present

## 2023-04-24 DIAGNOSIS — Z981 Arthrodesis status: Secondary | ICD-10-CM | POA: Diagnosis not present

## 2023-04-26 ENCOUNTER — Other Ambulatory Visit (HOSPITAL_BASED_OUTPATIENT_CLINIC_OR_DEPARTMENT_OTHER): Payer: Self-pay

## 2023-04-27 DIAGNOSIS — E1140 Type 2 diabetes mellitus with diabetic neuropathy, unspecified: Secondary | ICD-10-CM | POA: Diagnosis not present

## 2023-04-27 DIAGNOSIS — Z7985 Long-term (current) use of injectable non-insulin antidiabetic drugs: Secondary | ICD-10-CM | POA: Diagnosis not present

## 2023-04-27 DIAGNOSIS — G8929 Other chronic pain: Secondary | ICD-10-CM | POA: Diagnosis not present

## 2023-04-27 DIAGNOSIS — I700 Atherosclerosis of aorta: Secondary | ICD-10-CM | POA: Diagnosis not present

## 2023-04-27 DIAGNOSIS — E785 Hyperlipidemia, unspecified: Secondary | ICD-10-CM | POA: Diagnosis not present

## 2023-04-27 DIAGNOSIS — I2510 Atherosclerotic heart disease of native coronary artery without angina pectoris: Secondary | ICD-10-CM | POA: Diagnosis not present

## 2023-04-27 DIAGNOSIS — Z981 Arthrodesis status: Secondary | ICD-10-CM | POA: Diagnosis not present

## 2023-04-27 DIAGNOSIS — K219 Gastro-esophageal reflux disease without esophagitis: Secondary | ICD-10-CM | POA: Diagnosis not present

## 2023-04-27 DIAGNOSIS — I5032 Chronic diastolic (congestive) heart failure: Secondary | ICD-10-CM | POA: Diagnosis not present

## 2023-04-27 DIAGNOSIS — Z794 Long term (current) use of insulin: Secondary | ICD-10-CM | POA: Diagnosis not present

## 2023-04-27 DIAGNOSIS — I130 Hypertensive heart and chronic kidney disease with heart failure and stage 1 through stage 4 chronic kidney disease, or unspecified chronic kidney disease: Secondary | ICD-10-CM | POA: Diagnosis not present

## 2023-04-27 DIAGNOSIS — E1122 Type 2 diabetes mellitus with diabetic chronic kidney disease: Secondary | ICD-10-CM | POA: Diagnosis not present

## 2023-04-27 DIAGNOSIS — S8262XD Displaced fracture of lateral malleolus of left fibula, subsequent encounter for closed fracture with routine healing: Secondary | ICD-10-CM | POA: Diagnosis not present

## 2023-04-27 DIAGNOSIS — N183 Chronic kidney disease, stage 3 unspecified: Secondary | ICD-10-CM | POA: Diagnosis not present

## 2023-04-27 DIAGNOSIS — I252 Old myocardial infarction: Secondary | ICD-10-CM | POA: Diagnosis not present

## 2023-04-27 DIAGNOSIS — R296 Repeated falls: Secondary | ICD-10-CM | POA: Diagnosis not present

## 2023-04-27 DIAGNOSIS — Z9181 History of falling: Secondary | ICD-10-CM | POA: Diagnosis not present

## 2023-04-27 DIAGNOSIS — E039 Hypothyroidism, unspecified: Secondary | ICD-10-CM | POA: Diagnosis not present

## 2023-04-27 DIAGNOSIS — E1151 Type 2 diabetes mellitus with diabetic peripheral angiopathy without gangrene: Secondary | ICD-10-CM | POA: Diagnosis not present

## 2023-04-27 DIAGNOSIS — Z96611 Presence of right artificial shoulder joint: Secondary | ICD-10-CM | POA: Diagnosis not present

## 2023-05-01 DIAGNOSIS — S8265XD Nondisplaced fracture of lateral malleolus of left fibula, subsequent encounter for closed fracture with routine healing: Secondary | ICD-10-CM | POA: Diagnosis not present

## 2023-05-29 DIAGNOSIS — S8265XD Nondisplaced fracture of lateral malleolus of left fibula, subsequent encounter for closed fracture with routine healing: Secondary | ICD-10-CM | POA: Diagnosis not present

## 2023-06-08 DIAGNOSIS — S8265XD Nondisplaced fracture of lateral malleolus of left fibula, subsequent encounter for closed fracture with routine healing: Secondary | ICD-10-CM | POA: Diagnosis not present

## 2023-06-09 ENCOUNTER — Other Ambulatory Visit (HOSPITAL_BASED_OUTPATIENT_CLINIC_OR_DEPARTMENT_OTHER): Payer: Self-pay

## 2023-06-13 DIAGNOSIS — S8265XD Nondisplaced fracture of lateral malleolus of left fibula, subsequent encounter for closed fracture with routine healing: Secondary | ICD-10-CM | POA: Diagnosis not present

## 2023-06-14 ENCOUNTER — Other Ambulatory Visit (HOSPITAL_BASED_OUTPATIENT_CLINIC_OR_DEPARTMENT_OTHER): Payer: Self-pay

## 2023-06-22 DIAGNOSIS — S8265XD Nondisplaced fracture of lateral malleolus of left fibula, subsequent encounter for closed fracture with routine healing: Secondary | ICD-10-CM | POA: Diagnosis not present

## 2023-06-29 ENCOUNTER — Other Ambulatory Visit: Payer: Self-pay | Admitting: Cardiovascular Disease

## 2023-06-29 DIAGNOSIS — S8265XD Nondisplaced fracture of lateral malleolus of left fibula, subsequent encounter for closed fracture with routine healing: Secondary | ICD-10-CM | POA: Diagnosis not present

## 2023-07-06 DIAGNOSIS — S8265XD Nondisplaced fracture of lateral malleolus of left fibula, subsequent encounter for closed fracture with routine healing: Secondary | ICD-10-CM | POA: Diagnosis not present

## 2023-07-13 DIAGNOSIS — S8265XD Nondisplaced fracture of lateral malleolus of left fibula, subsequent encounter for closed fracture with routine healing: Secondary | ICD-10-CM | POA: Diagnosis not present

## 2023-07-20 DIAGNOSIS — S8265XD Nondisplaced fracture of lateral malleolus of left fibula, subsequent encounter for closed fracture with routine healing: Secondary | ICD-10-CM | POA: Diagnosis not present

## 2023-08-01 ENCOUNTER — Telehealth: Payer: Self-pay | Admitting: Pharmacist

## 2023-08-01 MED ORDER — MOUNJARO 12.5 MG/0.5ML ~~LOC~~ SOAJ: 12.5000 mg | SUBCUTANEOUS | 0 refills | Status: DC

## 2023-08-01 NOTE — Telephone Encounter (Signed)
 The patient reports she has been on 15 mg Mounjaro  for a long time. Recently, she has noticed that on the 2nd or 3rd day after taking her dose, she feels nauseated to the point that she does not feel like eating. She reduced her GERD medication dose, which has helped a little but not completely. Her weight is stable at 144 lbs.  We will reduce her Mounjaro  dose to 12.5 mg to improve tolerability.  She has not been checking her blood glucose since her meter broke but will get a new meter today and start monitoring again.  She is not taking any other diabetes medications besides Mounjaro .  A prescription for 12.5 mg Mounjaro  has been sent to her preferred pharmacy.

## 2023-08-27 MED ORDER — MOUNJARO 12.5 MG/0.5ML ~~LOC~~ SOAJ: 12.5000 mg | SUBCUTANEOUS | 1 refills | Status: DC

## 2023-08-27 NOTE — Telephone Encounter (Signed)
 Spoke to pt. Her weight is stable at 142 lbs and she tolerates 12.5 mg dose well. Wanted to stay on that dose for another month or two. Will call us  whenever ready to reduce dose to 10 mg once a week.

## 2023-08-27 NOTE — Addendum Note (Signed)
 Addended by: Skanda Worlds K on: 08/27/2023 12:58 PM   Modules accepted: Orders

## 2023-10-05 ENCOUNTER — Telehealth: Payer: Self-pay | Admitting: Pharmacist

## 2023-10-05 MED ORDER — MOUNJARO 10 MG/0.5ML ~~LOC~~ SOAJ: 10.0000 mg | SUBCUTANEOUS | 0 refills | Status: DC

## 2023-10-05 NOTE — Telephone Encounter (Signed)
 Patient LVM want to go back on Mounjaro  10 mg dose. Prescription for 10 mg mounjaro  sent to the pharmacy.

## 2023-10-15 ENCOUNTER — Other Ambulatory Visit: Payer: Self-pay | Admitting: Cardiovascular Disease

## 2023-10-16 ENCOUNTER — Other Ambulatory Visit: Payer: Self-pay | Admitting: Cardiovascular Disease

## 2023-10-16 MED ORDER — ICOSAPENT ETHYL 1 G PO CAPS: 2.0000 g | ORAL_CAPSULE | Freq: Two times a day (BID) | ORAL | 1 refills | Status: AC

## 2023-10-24 NOTE — Telephone Encounter (Signed)
 Successfully contacted and called patient. Informed her that average BP readings are good per provider. Instructed patient to continue to check BP three times a week and to monitor moving forward. Instructed patient to contact office if average blood pressure readings are > 140/80. Patient v/u. Instructed patient to follow current medication regimen.

## 2023-10-29 ENCOUNTER — Telehealth: Payer: Self-pay | Admitting: Pharmacist

## 2023-10-29 MED ORDER — MOUNJARO 10 MG/0.5ML ~~LOC~~ SOAJ: 10.0000 mg | SUBCUTANEOUS | 6 refills | Status: AC

## 2023-10-29 NOTE — Anesthesia Postprocedure Evaluation (Signed)
  Patient: Gabrielle [name] Procedure(s): [facility]) WITH  (IOL)-Left  Anesthesia type: MAC  Anesthesia Post Evaluation  Patient location: Phase 2 recovery. Level of consciousness: awake Pain management: adequate Airway patency: patent Cardiovascular status: acceptable Respiratory status: acceptable Hydration status: acceptable Vitals: stable Temperature: temperature adequate >96.77F PONV: nausea and vomiting controlled    BP: 134/61 (10/29/23 0947) Heart Rate: 71 (10/29/23 0947) Resp: 15 (10/29/23 0947) Temp: 97.3 F (36.3 C) (10/29/23 0947) SpO2: 95 % (10/29/23 0947) Weight: 144 lb 10 oz (65.6 kg) (10/29/23 0726) BMI (Calculated): 28.7 (10/29/23 0726)     Notable Events:    No Anesthesia notable events documented.

## 2023-10-29 NOTE — Telephone Encounter (Signed)
 Four weeks ago, the patient elected to reduce the Mounjaro  dose from 12.5 mg to 10 mg weekly due to nausea. Follow-up call placed; voicemail left. Patient returned call and reported that she is tolerating the 10 mg dose well. She wishes to continue at this dose, as she has reached her goal weight and her fasting blood glucose (FBG) levels are consistently in the 80-89 mg/dL range. A prescription for Mounjaro  10 mg once weekly for 46-month supply with 6 refills will be sent.

## 2023-11-07 ENCOUNTER — Other Ambulatory Visit: Payer: Self-pay | Admitting: Cardiovascular Disease
